# Patient Record
Sex: Female | Born: 1940 | Race: White | Hispanic: No | Smoking: Never smoker
Health system: Southern US, Community
[De-identification: ages and names within clinical notes are randomized; demographics above are authoritative.]

## PROBLEM LIST (undated history)

## (undated) DIAGNOSIS — C4491 Basal cell carcinoma of skin, unspecified: Secondary | ICD-10-CM

## (undated) DIAGNOSIS — I491 Atrial premature depolarization: Secondary | ICD-10-CM

## (undated) DIAGNOSIS — I10 Essential (primary) hypertension: Secondary | ICD-10-CM

## (undated) DIAGNOSIS — I499 Cardiac arrhythmia, unspecified: Secondary | ICD-10-CM

## (undated) DIAGNOSIS — D649 Anemia, unspecified: Secondary | ICD-10-CM

## (undated) DIAGNOSIS — H269 Unspecified cataract: Secondary | ICD-10-CM

## (undated) DIAGNOSIS — M8580 Other specified disorders of bone density and structure, unspecified site: Secondary | ICD-10-CM

## (undated) DIAGNOSIS — M51369 Other intervertebral disc degeneration, lumbar region without mention of lumbar back pain or lower extremity pain: Secondary | ICD-10-CM

## (undated) DIAGNOSIS — R413 Other amnesia: Secondary | ICD-10-CM

## (undated) DIAGNOSIS — M1990 Unspecified osteoarthritis, unspecified site: Secondary | ICD-10-CM

## (undated) DIAGNOSIS — N390 Urinary tract infection, site not specified: Secondary | ICD-10-CM

## (undated) DIAGNOSIS — Z87442 Personal history of urinary calculi: Secondary | ICD-10-CM

## (undated) DIAGNOSIS — I4891 Unspecified atrial fibrillation: Secondary | ICD-10-CM

## (undated) DIAGNOSIS — N281 Cyst of kidney, acquired: Secondary | ICD-10-CM

## (undated) DIAGNOSIS — Z8619 Personal history of other infectious and parasitic diseases: Secondary | ICD-10-CM

## (undated) DIAGNOSIS — R718 Other abnormality of red blood cells: Secondary | ICD-10-CM

## (undated) DIAGNOSIS — I2510 Atherosclerotic heart disease of native coronary artery without angina pectoris: Secondary | ICD-10-CM

## (undated) DIAGNOSIS — I700 Atherosclerosis of aorta: Secondary | ICD-10-CM

## (undated) DIAGNOSIS — E538 Deficiency of other specified B group vitamins: Secondary | ICD-10-CM

## (undated) DIAGNOSIS — D3502 Benign neoplasm of left adrenal gland: Secondary | ICD-10-CM

## (undated) DIAGNOSIS — Z7901 Long term (current) use of anticoagulants: Secondary | ICD-10-CM

## (undated) DIAGNOSIS — E010 Iodine-deficiency related diffuse (endemic) goiter: Secondary | ICD-10-CM

## (undated) DIAGNOSIS — M5136 Other intervertebral disc degeneration, lumbar region: Secondary | ICD-10-CM

## (undated) HISTORY — DX: Unspecified osteoarthritis, unspecified site: M19.90

## (undated) HISTORY — PX: EYE SURGERY: SHX253

## (undated) HISTORY — DX: Unspecified cataract: H26.9

## (undated) HISTORY — DX: Personal history of other infectious and parasitic diseases: Z86.19

## (undated) HISTORY — PX: CATARACT EXTRACTION: SUR2

## (undated) HISTORY — DX: Urinary tract infection, site not specified: N39.0

## (undated) HISTORY — DX: Cardiac arrhythmia, unspecified: I49.9

## (undated) HISTORY — PX: JOINT REPLACEMENT: SHX530

## (undated) HISTORY — PX: FOOT SURGERY: SHX648

---

## 2007-07-29 HISTORY — PX: BUNIONECTOMY WITH HAMMERTOE RECONSTRUCTION: SHX5600

## 2016-10-02 DIAGNOSIS — Z9842 Cataract extraction status, left eye: Secondary | ICD-10-CM | POA: Diagnosis not present

## 2016-10-02 DIAGNOSIS — H01001 Unspecified blepharitis right upper eyelid: Secondary | ICD-10-CM | POA: Diagnosis not present

## 2016-10-02 DIAGNOSIS — H02834 Dermatochalasis of left upper eyelid: Secondary | ICD-10-CM | POA: Diagnosis not present

## 2016-10-02 DIAGNOSIS — H01004 Unspecified blepharitis left upper eyelid: Secondary | ICD-10-CM | POA: Diagnosis not present

## 2016-10-02 DIAGNOSIS — H01002 Unspecified blepharitis right lower eyelid: Secondary | ICD-10-CM | POA: Diagnosis not present

## 2016-10-02 DIAGNOSIS — H11001 Unspecified pterygium of right eye: Secondary | ICD-10-CM | POA: Diagnosis not present

## 2016-10-02 DIAGNOSIS — H02831 Dermatochalasis of right upper eyelid: Secondary | ICD-10-CM | POA: Diagnosis not present

## 2016-10-02 DIAGNOSIS — Z9841 Cataract extraction status, right eye: Secondary | ICD-10-CM | POA: Diagnosis not present

## 2016-10-02 DIAGNOSIS — H01005 Unspecified blepharitis left lower eyelid: Secondary | ICD-10-CM | POA: Diagnosis not present

## 2017-05-09 DIAGNOSIS — Z23 Encounter for immunization: Secondary | ICD-10-CM | POA: Diagnosis not present

## 2017-09-04 DIAGNOSIS — Z961 Presence of intraocular lens: Secondary | ICD-10-CM | POA: Diagnosis not present

## 2017-09-04 DIAGNOSIS — H35372 Puckering of macula, left eye: Secondary | ICD-10-CM | POA: Diagnosis not present

## 2017-09-04 DIAGNOSIS — H43391 Other vitreous opacities, right eye: Secondary | ICD-10-CM | POA: Diagnosis not present

## 2017-09-04 DIAGNOSIS — H524 Presbyopia: Secondary | ICD-10-CM | POA: Diagnosis not present

## 2018-04-22 DIAGNOSIS — Z23 Encounter for immunization: Secondary | ICD-10-CM | POA: Diagnosis not present

## 2018-05-04 ENCOUNTER — Telehealth: Payer: Self-pay

## 2018-05-04 NOTE — Telephone Encounter (Signed)
Copied from [place] [id number]. Topic: Appointment Scheduling - New Patient >> May 04, 2018 10:20 AM Vernona Rieger wrote: New patient has been scheduled for your office. Provider: Aundra Dubin Date of Appointment: 11/15  Route to department's PEC pool.

## 2018-05-04 NOTE — Telephone Encounter (Signed)
NP paperwork mailed out.

## 2018-05-27 ENCOUNTER — Encounter: Payer: Self-pay | Admitting: Podiatry

## 2018-05-27 ENCOUNTER — Ambulatory Visit (INDEPENDENT_AMBULATORY_CARE_PROVIDER_SITE_OTHER): Payer: Medicare Other | Admitting: Podiatry

## 2018-05-27 DIAGNOSIS — B351 Tinea unguium: Secondary | ICD-10-CM | POA: Diagnosis not present

## 2018-05-27 DIAGNOSIS — M79675 Pain in left toe(s): Secondary | ICD-10-CM

## 2018-05-27 DIAGNOSIS — M79674 Pain in right toe(s): Secondary | ICD-10-CM

## 2018-05-27 DIAGNOSIS — L84 Corns and callosities: Secondary | ICD-10-CM

## 2018-05-27 NOTE — Progress Notes (Signed)
This patient presents the office with chief complaint of long thick painful nails.  She presents the office accompanied by her daughter for her visit.  She says that the nails are painful walking and wearing her shoes.  She is unable to self treat.  She also has calluses on her big toe left foot which is caused by the bunion on her left foot.  She also has a history of bunion correction in [place] 10 years ago.  She presents the office today for evaluation and treatment of her feet  General Appearance  Alert, conversant and in no acute stress.  Vascular  Dorsalis pedis and posterior tibial  pulses are palpable  bilaterally.  Capillary return is within normal limits  bilaterally. Temperature is within normal limits  bilaterally.  Neurologic  Senn-Weinstein monofilament wire test within normal limits  bilaterally. Muscle power within normal limits bilaterally.  Nails Thick disfigured discolored nails with subungual debris  from hallux to fifth toes bilaterally. No evidence of bacterial infection or drainage bilaterally.  Orthopedic  No limitations of motion  feet .  No crepitus or effusions noted.  HAV  B/L.  Skin  normotropic skin with no porokeratosis noted bilaterally.  No signs of infections or ulcers noted.  Pinch callus left hallux.  Onychomycosis  Callus left hallux   IE  Debride nails.  Debride callus  RTC 3 months.  Patient was told to take off her nail polish prior to her next visit.   Gardiner Barefoot DPM

## 2018-06-11 ENCOUNTER — Encounter: Payer: Self-pay | Admitting: Internal Medicine

## 2018-06-11 ENCOUNTER — Ambulatory Visit (INDEPENDENT_AMBULATORY_CARE_PROVIDER_SITE_OTHER): Payer: Medicare Other | Admitting: Internal Medicine

## 2018-06-11 VITALS — BP 132/96 | HR 90 | Temp 98.4°F | Ht 63.0 in | Wt 158.1 lb

## 2018-06-11 DIAGNOSIS — M25561 Pain in right knee: Secondary | ICD-10-CM

## 2018-06-11 DIAGNOSIS — M25562 Pain in left knee: Secondary | ICD-10-CM | POA: Diagnosis not present

## 2018-06-11 DIAGNOSIS — M25511 Pain in right shoulder: Secondary | ICD-10-CM | POA: Diagnosis not present

## 2018-06-11 DIAGNOSIS — R413 Other amnesia: Secondary | ICD-10-CM | POA: Diagnosis not present

## 2018-06-11 DIAGNOSIS — M1990 Unspecified osteoarthritis, unspecified site: Secondary | ICD-10-CM | POA: Diagnosis not present

## 2018-06-11 DIAGNOSIS — G8929 Other chronic pain: Secondary | ICD-10-CM | POA: Diagnosis not present

## 2018-06-11 HISTORY — DX: Pain in right knee: M25.561

## 2018-06-11 HISTORY — DX: Pain in right shoulder: M25.511

## 2018-06-11 MED ORDER — DICLOFENAC SODIUM 1 % TD GEL: 2.0000 g | Freq: Four times a day (QID) | TRANSDERMAL | 11 refills | Status: DC

## 2018-06-11 NOTE — Progress Notes (Signed)
Chief Complaint  Patient presents with  . Establish Care   New patient with daughter today whom she lives  1. C/o knee pain, right shoulder pain at times walks with cane and reduced ROM right shoulder hemp tried and daughter reports c/o less pain  2. Daughter c/o memory loss and pt getting loss driving this is new w/in the last 1 year. She is c/w fact if mom should be driving    Review of Systems  Constitutional: Negative for weight loss.  HENT: Negative for hearing loss.   Eyes: Negative for blurred vision.  Respiratory: Negative for shortness of breath.   Cardiovascular: Negative for chest pain.  Gastrointestinal: Negative for abdominal pain.  Musculoskeletal: Positive for joint pain.  Skin: Negative for rash.  Neurological: Negative for headaches.  Psychiatric/Behavioral: Positive for memory loss. Negative for depression.   Past Medical History:  Diagnosis Date  . Arthritis    knees, right shoulder   . History of chicken pox   . UTI (urinary tract infection)    Past Surgical History:  Procedure Laterality Date  . [place]  . EYE SURGERY     cataract 2013/2014 b/l   . FOOT SURGERY     bunion an dhammer toe in 2009   Family History  Problem Relation Age of Onset  . Heart disease Mother        died when pt was 55 y.o   . Heart disease Father    Social History   Socioeconomic History  . Marital status: Unknown    Spouse name: Not on file  . Number of children: Not on file  . Years of education: Not on file  . Highest education level: Not on file  Occupational History  . Not on file  Social Needs  . Financial resource strain: Not on file  . Food insecurity:    Worry: Not on file    Inability: Not on file  . Transportation needs:    Medical: Not on file    Non-medical: Not on file  Tobacco Use  . Smoking status: Never Smoker  . Smokeless tobacco: Never Used  Substance and Sexual Activity  . Alcohol use: Yes    Alcohol/week: 1.0 - 2.0  standard drinks    Types: 1 - 2 Glasses of wine per week    Comment: occasional drinker  . Drug use: Never  . Sexual activity: Not on file  Lifestyle  . Physical activity:    Days per week: Not on file    Minutes per session: Not on file  . Stress: Not on file  Relationships  . Social connections:    Talks on phone: Not on file    Gets together: Not on file    Attends religious service: Not on file    Active member of club or organization: Not on file    Attends meetings of clubs or organizations: Not on file    Relationship status: Not on file  . Intimate partner violence:    Fear of current or ex partner: Not on file    Emotionally abused: Not on file    Physically abused: Not on file    Forced sexual activity: Not on file  Other Topics Concern  . Not on file  Social History Narrative   From [place] lived in [place] since late 1990s early 2000    Lives with daughter    Secretary/administrator ed    Former Pharmacist, hospital  No guns, wears seat belt, safe in relationship       2 daughters 1/2 in [employer]    Current Meds  Medication Sig  . CHOLECALCIFEROL PO Take 1,000 Units by mouth daily.   . NON FORMULARY Hemp Oil 500mg  daily  . Omega-3 Fatty Acids (FISH OIL) 1000 MG CAPS Take by mouth daily.   Marland Kitchen UNABLE TO FIND Med Name: D6 100mg  daily  . UNABLE TO FIND Med Name: B12 1088mcg daily  . vitamin B-12 (CYANOCOBALAMIN) 100 MCG tablet Take 100 mcg by mouth daily.   No Known Allergies No results found for this or any previous visit (from the past 2160 hour(s)). Objective  Body mass index is 28.01 kg/m. Wt Readings from Last 3 Encounters:  06/11/18 158 lb 1.9 oz (71.7 kg)   Temp Readings from Last 3 Encounters:  06/11/18 98.4 F (36.9 C) (Oral)   BP Readings from Last 3 Encounters:  06/11/18 (!) 132/96   Pulse Readings from Last 3 Encounters:  06/11/18 90    Physical Exam  Constitutional: She is oriented to person, place, and time. Vital signs are normal. She appears well-developed and  well-nourished. She is cooperative.  HENT:  Head: Normocephalic and atraumatic.  Mouth/Throat: Oropharynx is clear and moist and mucous membranes are normal.  Eyes: Pupils are equal, round, and reactive to light. Conjunctivae are normal.  Cardiovascular: Normal rate, regular rhythm and normal heart sounds.  Pulmonary/Chest: Effort normal and breath sounds normal.  Musculoskeletal:       Right shoulder: She exhibits decreased range of motion and tenderness.  Neurological: She is alert and oriented to person, place, and time. Gait normal.  BL walks with cane   Skin: Skin is warm, dry and intact.  Psychiatric: She has a normal mood and affect. Her speech is normal and behavior is normal. Judgment and thought content normal. Cognition and memory are normal.  Nursing note and vitals reviewed.   Assessment   1. Right shoulder and b/l knee pain likely arthritis  2. C/w memory loss  3. HM Plan   1.  Consider Xrays and order referral if getting worse  Trial of voltaren gel  Disc prn Tylenol, tumeric/glucosamine/chondroitin  2.  sch fasting labs  Consider CT head and OT eval for driving daughter declines for now  3.  Flu shot had 04/22/18 and prevnar  Consider pna 23 in future if has not had in 1 year  Consider shingrix vaccine and Tdap   Never smoker  Daughter will reach out to sister to try to get pap, mammo, DEXA records from [place]  Never had colonoscopy consider cologaurd in the future   Eye Dr. Alice Reichert  Podiatry Dr. Prudence Davidson  Dentist Dr. Gilman Schmidt  Provider: Dr. Olivia Mackie McLean-Scocuzza-Internal Medicine

## 2018-06-11 NOTE — Patient Instructions (Addendum)
Tylenol as needed 500 mg up to 6 pills per day  Tumeric or Glucosamine chondroitin    Management of Memory Problems  There are some general things you can do to help manage your memory problems.  Your memory may not in fact recover, but by using techniques and strategies you will be able to manage your memory difficulties better.  1)  Establish a routine.  Try to establish and then stick to a regular routine.  By doing this, you will get used to what to expect and you will reduce the need to rely on your memory.  Also, try to do things at the same time of day, such as taking your medication or checking your calendar first thing in the morning.  Think about think that you can do as a part of a regular routine and make a list.  Then enter them into a daily planner to remind you.  This will help you establish a routine.  2)  Organize your environment.  Organize your environment so that it is uncluttered.  Decrease visual stimulation.  Place everyday items such as keys or cell phone in the same place every day (ie.  Basket next to front door)  Use post it notes with a brief message to yourself (ie. Turn off light, lock the door)  Use labels to indicate where things go (ie. Which cupboards are for food, dishes, etc.)  Keep a notepad and pen by the telephone to take messages  3)  Memory Aids  A diary or journal/notebook/daily planner  Making a list (shopping list, chore list, to do list that needs to be done)  Using an alarm as a reminder (kitchen timer or cell phone alarm)  Using cell phone to store information (Notes, Calendar, Reminders)  Calendar/White board placed in a prominent position  Post-it notes  In order for memory aids to be useful, you need to have good habits.  It's no good remembering to make a note in your journal if you don't remember to look in it.  Try setting aside a certain time of day to look in journal.  4)  Improving mood and managing fatigue.  There may  be other factors that contribute to memory difficulties.  Factors, such as anxiety, depression and tiredness can affect memory.  Regular gentle exercise can help improve your mood and give you more energy.  Simple relaxation techniques may help relieve symptoms of anxiety  Try to get back to completing activities or hobbies you enjoyed doing in the past.  Learn to pace yourself through activities to decrease fatigue.  Find out about some local support groups where you can share experiences with others.  Try and achieve 7-8 hours of sleep at night. Dementia Dementia is the loss of two or more brain functions, such as:  Memory.  Decision making.  Behavior.  Speaking.  Thinking.  Problem solving.  There are many types of dementia. The most common type is called progressive dementia. Progressive dementia gets worse with time and it is irreversible. An example of this type of dementia is Alzheimer disease. What are the causes? This condition may be caused by:  Nerve cell damage in the brain.  Genetic mutations.  Certain medicines.  Multiple small strokes.  An infection, such as chronic meningitis.  A metabolic problem, such as vitamin B12 deficiency or thyroid disease.  Pressure on the brain, such as from a tumor or blood clot.  What are the signs or symptoms? Symptoms of this  condition include:  Sudden changes in mood.  Depression.  Problems with balance.  Changes in personality.  Poor short-term memory.  Agitation.  Delusions.  Hallucinations.  Having a hard time: ? Speaking thoughts. ? Finding words. ? Solving problems. ? Doing familiar tasks. ? Understanding familiar ideas.  How is this diagnosed? This condition is diagnosed with an assessment by your health care provider. During this assessment, your health care provider will talk with you and your family, friends, or caregivers about your symptoms. A thorough medical history will be taken,  and you will have a physical exam and tests. Tests may include:  Lab tests, such as blood or urine tests.  Imaging tests, such as a CT scan, PET scan, or MRI.  A lumbar puncture. This test involves removing and testing a small amount of the fluid that surrounds the brain and spinal cord.  An electroencephalogram (EEG). In this test, small metal discs are used to measure electrical activity in the brain.  Memory tests, cognitive tests, and neuropsychological tests. These tests evaluate brain function.  How is this treated? Treatment depends on the cause of the dementia. It may involve taking medicines that may help:  To control the dementia.  To slow down the disease.  To manage symptoms.  In some cases, treating the cause of the dementia can improve symptoms, reverse symptoms, or slow down how quickly the dementia gets worse. Your health care provider can help direct you to support groups, organizations, and other health care providers who can help with decisions about your care. Follow these instructions at home: Medicine  Take over-the-counter and prescription medicines only as told by your health care provider.  Avoid taking medicines that can affect thinking, such as pain or sleeping medicines. Lifestyle   Make healthy lifestyle choices: ? Be physically active as told by your health care provider. ? Do not use any tobacco products, such as cigarettes, chewing tobacco, and e-cigarettes. If you need help quitting, ask your health care provider. ? Eat a healthy diet. ? Practice stress-management techniques when you get stressed. ? Stay social.  Drink enough fluid to keep your urine clear or pale yellow.  Make sure to get quality sleep. These tips can help you to get a good night's rest: ? Avoid napping during the day. ? Keep your sleeping area dark and cool. ? Avoid exercising during the few hours before you go to bed. ? Avoid caffeine products in the evening. General  instructions  Work with your health care provider to determine what you need help with and what your safety needs are.  If you were given a bracelet that tracks your location, make sure to wear it.  Keep all follow-up visits as told by your health care provider. This is important. Contact a health care provider if:  You have any new symptoms.  You have problems with choking or swallowing.  You have any symptoms of a different illness. Get help right away if:  You develop a fever.  You have new or worsening confusion.  You have new or worsening sleepiness.  You have a hard time staying awake.  You or your family members become concerned for your safety. This information is not intended to replace advice given to you by your health care provider. Make sure you discuss any questions you have with your health care provider. Document Released: 01/07/2001 Document Revised: 11/22/2015 Document Reviewed: 04/11/2015 Elsevier Interactive Patient Education  2018 [employer].  Shoulder Pain Many things can  cause shoulder pain, including:  An injury to the area.  Overuse of the shoulder.  Arthritis.  The source of the pain can be:  Inflammation.  An injury to the shoulder joint.  An injury to a tendon, ligament, or bone.  Follow these instructions at home: Take these actions to help with your pain:  Squeeze a soft ball or a foam pad as much as possible. This helps to keep the shoulder from swelling. It also helps to strengthen the arm.  Take over-the-counter and prescription medicines only as told by your health care provider.  If directed, apply ice to the area: ? Put ice in a plastic bag. ? Place a towel between your skin and the bag. ? Leave the ice on for 20 minutes, 2-3 times per day. Stop applying ice if it does not help with the pain.  If you were given a shoulder sling or immobilizer: ? Wear it as told. ? Remove it to shower or bathe. ? Move your arm as  little as possible, but keep your hand moving to prevent swelling.  Contact a health care provider if:  Your pain gets worse.  Your pain is not relieved with medicines.  New pain develops in your arm, hand, or fingers. Get help right away if:  Your arm, hand, or fingers: ? Tingle. ? Become numb. ? Become swollen. ? Become painful. ? Turn white or blue. This information is not intended to replace advice given to you by your health care provider. Make sure you discuss any questions you have with your health care provider. Document Released: 04/23/2005 Document Revised: 03/09/2016 Document Reviewed: 11/06/2014 Elsevier Interactive Patient Education  2018 [place].  Shoulder Impingement Syndrome Shoulder impingement syndrome is a condition that causes pain when connective tissues (tendons) surrounding the shoulder joint become pinched. These tendons are part of the group of muscles and tissues that help to stabilize the shoulder (rotator cuff). Beneath the rotator cuff is a fluid-filled sac (bursa) that allows the muscles and tendons to glide smoothly. The bursa may become swollen or irritated (bursitis). Bursitis, swelling in the rotator cuff tendons, or both conditions can decrease how much space is under a bone in the shoulder joint (acromion), resulting in impingement. What are the causes? Shoulder impingement syndrome can be caused by bursitis or swelling of the rotator cuff tendons, which may result from:  Repetitive overhead arm movements.  Falling onto the shoulder.  Weakness in the shoulder muscles.  What increases the risk? You may be more likely to develop this condition if you are an athlete who participates in:  Sports that involve throwing, such as baseball.  Tennis.  Swimming.  Volleyball.  Some people are also more likely to develop impingement syndrome because of the shape of their acromion bone. What are the signs or symptoms? The main symptom of this  condition is pain on the front or side of the shoulder. Pain may:  Get worse when lifting or raising the arm.  Get worse at night.  Wake you up from sleeping.  Feel sharp when the shoulder is moved, and then fade to an ache.  Other signs and symptoms may include:  Tenderness.  Stiffness.  Inability to raise the arm above shoulder level or behind the body.  Weakness.  How is this diagnosed? This condition may be diagnosed based on:  Your symptoms.  Your medical history.  A physical exam.  Imaging tests, such as: ? X-rays. ? MRI. ? Ultrasound.  How is  this treated? Treatment for this condition may include:  Resting your shoulder and avoiding all activities that cause pain or put stress on the shoulder.  Icing your shoulder.  NSAIDs to help reduce pain and swelling.  One or more injections of medicines to numb the area and reduce inflammation.  Physical therapy.  Surgery. This may be needed if nonsurgical treatments have not helped. Surgery may involve repairing the rotator cuff, reshaping the acromion, or removing the bursa.  Follow these instructions at home: Managing pain, stiffness, and swelling  If directed, apply ice to the injured area. ? Put ice in a plastic bag. ? Place a towel between your skin and the bag. ? Leave the ice on for 20 minutes, 2-3 times a day. Activity  Rest and return to your normal activities as told by your health care provider. Ask your health care provider what activities are safe for you.  Do exercises as told by your health care provider. General instructions  Do not use any tobacco products, including cigarettes, chewing tobacco, or e-cigarettes. Tobacco can delay healing. If you need help quitting, ask your health care provider.  Ask your health care provider when it is safe for you to drive.  Take over-the-counter and prescription medicines only as told by your health care provider.  Keep all follow-up visits as told  by your health care provider. This is important. How is this prevented?  Give your body time to rest between periods of activity.  Be safe and responsible while being active to avoid falls.  Maintain physical fitness, including strength and flexibility. Contact a health care provider if:  Your symptoms have not improved after 1-2 months of treatment and rest.  You cannot lift your arm away from your body. This information is not intended to replace advice given to you by your health care provider. Make sure you discuss any questions you have with your health care provider. Document Released: 07/14/2005 Document Revised: 03/20/2016 Document Reviewed: 06/16/2015 Elsevier Interactive Patient Education  [employer].

## 2018-06-11 NOTE — Progress Notes (Signed)
Pre visit review using our clinic review tool, if applicable. No additional management support is needed unless otherwise documented below in the visit note. 

## 2018-06-15 ENCOUNTER — Telehealth: Payer: Self-pay | Admitting: *Deleted

## 2018-06-15 NOTE — Telephone Encounter (Signed)
Please place future lab orders for lab appt on 06/16/18

## 2018-06-16 ENCOUNTER — Encounter: Payer: Self-pay | Admitting: Internal Medicine

## 2018-06-16 ENCOUNTER — Other Ambulatory Visit (INDEPENDENT_AMBULATORY_CARE_PROVIDER_SITE_OTHER): Payer: Medicare Other

## 2018-06-16 ENCOUNTER — Other Ambulatory Visit: Payer: Self-pay | Admitting: Internal Medicine

## 2018-06-16 DIAGNOSIS — E538 Deficiency of other specified B group vitamins: Secondary | ICD-10-CM

## 2018-06-16 DIAGNOSIS — E559 Vitamin D deficiency, unspecified: Secondary | ICD-10-CM

## 2018-06-16 DIAGNOSIS — Z0000 Encounter for general adult medical examination without abnormal findings: Secondary | ICD-10-CM

## 2018-06-16 DIAGNOSIS — Z87440 Personal history of urinary (tract) infections: Secondary | ICD-10-CM | POA: Diagnosis not present

## 2018-06-16 DIAGNOSIS — Z1389 Encounter for screening for other disorder: Secondary | ICD-10-CM | POA: Diagnosis not present

## 2018-06-16 DIAGNOSIS — Z13220 Encounter for screening for lipoid disorders: Secondary | ICD-10-CM | POA: Diagnosis not present

## 2018-06-16 DIAGNOSIS — M1990 Unspecified osteoarthritis, unspecified site: Secondary | ICD-10-CM

## 2018-06-16 DIAGNOSIS — R030 Elevated blood-pressure reading, without diagnosis of hypertension: Secondary | ICD-10-CM

## 2018-06-16 DIAGNOSIS — R413 Other amnesia: Secondary | ICD-10-CM

## 2018-06-16 DIAGNOSIS — Z1329 Encounter for screening for other suspected endocrine disorder: Secondary | ICD-10-CM

## 2018-06-16 LAB — COMPREHENSIVE METABOLIC PANEL
ALT: 14 U/L (ref 0–35)
AST: 13 U/L (ref 0–37)
Albumin: 4.1 g/dL (ref 3.5–5.2)
Alkaline Phosphatase: 89 U/L (ref 39–117)
BUN: 21 mg/dL (ref 6–23)
CO2: 28 mEq/L (ref 19–32)
Calcium: 9.5 mg/dL (ref 8.4–10.5)
Chloride: 103 mEq/L (ref 96–112)
Creatinine, Ser: 0.62 mg/dL (ref 0.40–1.20)
GFR: 98.95 mL/min (ref >60.00)
Glucose, Bld: 93 mg/dL (ref 70–99)
Potassium: 4 mEq/L (ref 3.5–5.1)
Sodium: 140 mEq/L (ref 135–145)
Total Bilirubin: 0.7 mg/dL (ref 0.2–1.2)
Total Protein: 6.8 g/dL (ref 6.0–8.3)

## 2018-06-16 LAB — LIPID PANEL
Cholesterol: 173 mg/dL (ref 0–200)
HDL: 65.6 mg/dL (ref >39.00)
LDL Cholesterol: 93 mg/dL (ref 0–99)
NonHDL: 107.37
Total CHOL/HDL Ratio: 3
Triglycerides: 73 mg/dL (ref 0.0–149.0)
VLDL: 14.6 mg/dL (ref 0.0–40.0)

## 2018-06-16 LAB — CBC WITH DIFFERENTIAL/PLATELET
Basophils Absolute: 0 10*3/uL (ref 0.0–0.1)
Basophils Relative: 0.3 % (ref 0.0–3.0)
Eosinophils Absolute: 0.1 10*3/uL (ref 0.0–0.7)
Eosinophils Relative: 1.6 % (ref 0.0–5.0)
HCT: 42.1 % (ref 36.0–46.0)
Hemoglobin: 13.3 g/dL (ref 12.0–15.0)
Lymphocytes Relative: 31.7 % (ref 12.0–46.0)
Lymphs Abs: 2.2 10*3/uL (ref 0.7–4.0)
MCHC: 31.6 g/dL (ref 30.0–36.0)
MCV: 73.3 fl — ABNORMAL LOW (ref 78.0–100.0)
Monocytes Absolute: 0.5 10*3/uL (ref 0.1–1.0)
Monocytes Relative: 7.7 % (ref 3.0–12.0)
Neutro Abs: 4 10*3/uL (ref 1.4–7.7)
Neutrophils Relative %: 58.7 % (ref 43.0–77.0)
Platelets: 204 10*3/uL (ref 150.0–400.0)
RBC: 5.75 Mil/uL — ABNORMAL HIGH (ref 3.87–5.11)
RDW: 14.9 % (ref 11.5–15.5)
WBC: 6.9 10*3/uL (ref 4.0–10.5)

## 2018-06-16 LAB — VITAMIN B12: Vitamin B-12: 667 pg/mL (ref 211–911)

## 2018-06-16 LAB — TSH: TSH: 0.56 u[IU]/mL (ref 0.35–4.50)

## 2018-06-16 LAB — VITAMIN D 25 HYDROXY (VIT D DEFICIENCY, FRACTURES): VITD: 40.14 ng/mL (ref 30.00–100.00)

## 2018-06-16 NOTE — Addendum Note (Signed)
Addended by: Arby Barrette on: 06/16/2018 10:02 AM   Modules accepted: Orders

## 2018-06-17 ENCOUNTER — Other Ambulatory Visit: Payer: Self-pay | Admitting: Internal Medicine

## 2018-06-17 DIAGNOSIS — R319 Hematuria, unspecified: Secondary | ICD-10-CM

## 2018-06-17 LAB — MICROSCOPIC EXAMINATION: Casts: NONE SEEN /lpf

## 2018-06-17 LAB — URINALYSIS, ROUTINE W REFLEX MICROSCOPIC
Bilirubin, UA: NEGATIVE
Glucose, UA: NEGATIVE
Ketones, UA: NEGATIVE
Nitrite, UA: NEGATIVE
Protein, UA: NEGATIVE
Specific Gravity, UA: 1.021 (ref 1.005–1.030)
Urobilinogen, Ur: 1 mg/dL (ref 0.2–1.0)
pH, UA: 6.5 (ref 5.0–7.5)

## 2018-06-21 ENCOUNTER — Other Ambulatory Visit: Payer: Medicare Other

## 2018-06-21 ENCOUNTER — Encounter: Payer: Self-pay | Admitting: *Deleted

## 2018-06-21 DIAGNOSIS — R319 Hematuria, unspecified: Secondary | ICD-10-CM

## 2018-06-23 ENCOUNTER — Other Ambulatory Visit: Payer: Self-pay | Admitting: Internal Medicine

## 2018-06-23 DIAGNOSIS — B9620 Unspecified Escherichia coli [E. coli] as the cause of diseases classified elsewhere: Secondary | ICD-10-CM

## 2018-06-23 DIAGNOSIS — N390 Urinary tract infection, site not specified: Principal | ICD-10-CM

## 2018-06-23 LAB — URINE CULTURE
MICRO NUMBER:: 91418260
SPECIMEN QUALITY:: ADEQUATE

## 2018-06-23 MED ORDER — CIPROFLOXACIN HCL 500 MG PO TABS: 500.0000 mg | ORAL_TABLET | Freq: Two times a day (BID) | ORAL | 0 refills | Status: DC

## 2018-08-26 ENCOUNTER — Ambulatory Visit (INDEPENDENT_AMBULATORY_CARE_PROVIDER_SITE_OTHER): Payer: Medicare Other | Admitting: Podiatry

## 2018-08-26 ENCOUNTER — Encounter: Payer: Self-pay | Admitting: Podiatry

## 2018-08-26 DIAGNOSIS — M79674 Pain in right toe(s): Secondary | ICD-10-CM

## 2018-08-26 DIAGNOSIS — B351 Tinea unguium: Secondary | ICD-10-CM

## 2018-08-26 DIAGNOSIS — M79675 Pain in left toe(s): Secondary | ICD-10-CM

## 2018-08-26 DIAGNOSIS — L84 Corns and callosities: Secondary | ICD-10-CM

## 2018-08-26 NOTE — Progress Notes (Signed)
Complaint:  Visit Type: Patient returns to my office for continued preventative foot care services. Complaint: Patient states" my nails have grown long and thick and become painful to walk and wear shoes" .  She presents to the office f or painful calluses both feet. The patient presents for preventative foot care services. No changes to ROS  Podiatric Exam: Vascular: dorsalis pedis and posterior tibial pulses are palpable bilateral. Capillary return is immediate. Temperature gradient is WNL. Skin turgor WNL  Sensorium: Normal Semmes Weinstein monofilament test. Normal tactile sensation bilaterally. Nail Exam: Pt has thick disfigured discolored nails with subungual debris noted bilateral entire nail hallux through fifth toenails Ulcer Exam: There is no evidence of ulcer or pre-ulcerative changes or infection. Orthopedic Exam: Muscle tone and strength are WNL. No limitations in general ROM. No crepitus or effusions noted. Foot type and digits show no abnormalities. Bony prominences are unremarkable. Skin: No Porokeratosis. No infection or ulcers.  Pinch callus left hallux.  Callus sub 1st MPJ right foot.  Diagnosis:  Onychomycosis, , Pain in right toe, pain in left toes  Callus  X 2  Treatment & Plan Procedures and Treatment: Consent by patient was obtained for treatment procedures.   Debridement of mycotic and hypertrophic toenails, 1 through 5 bilateral and clearing of subungual debris. No ulceration, no infection noted. Debride callus  B/l..  ABN signed for 2020.Return Visit-Office Procedure: Patient instructed to return to the office for a follow up visit 3 months for continued evaluation and treatment.    Gardiner Barefoot DPM

## 2018-09-10 DIAGNOSIS — H43393 Other vitreous opacities, bilateral: Secondary | ICD-10-CM | POA: Diagnosis not present

## 2018-09-10 DIAGNOSIS — H35372 Puckering of macula, left eye: Secondary | ICD-10-CM | POA: Diagnosis not present

## 2018-09-10 DIAGNOSIS — Z961 Presence of intraocular lens: Secondary | ICD-10-CM | POA: Diagnosis not present

## 2018-09-15 ENCOUNTER — Ambulatory Visit (INDEPENDENT_AMBULATORY_CARE_PROVIDER_SITE_OTHER): Payer: Medicare Other | Admitting: Internal Medicine

## 2018-09-15 ENCOUNTER — Encounter: Payer: Self-pay | Admitting: Internal Medicine

## 2018-09-15 VITALS — BP 122/70 | HR 94 | Temp 98.6°F | Ht 63.0 in | Wt 152.2 lb

## 2018-09-15 DIAGNOSIS — Z1231 Encounter for screening mammogram for malignant neoplasm of breast: Secondary | ICD-10-CM | POA: Diagnosis not present

## 2018-09-15 DIAGNOSIS — Z13820 Encounter for screening for osteoporosis: Secondary | ICD-10-CM

## 2018-09-15 DIAGNOSIS — E2839 Other primary ovarian failure: Secondary | ICD-10-CM | POA: Diagnosis not present

## 2018-09-15 DIAGNOSIS — R718 Other abnormality of red blood cells: Secondary | ICD-10-CM

## 2018-09-15 DIAGNOSIS — N390 Urinary tract infection, site not specified: Secondary | ICD-10-CM

## 2018-09-15 NOTE — Patient Instructions (Signed)
Please take multivitamin with iron 325 mg/65 Fe

## 2018-09-15 NOTE — Progress Notes (Signed)
Pre visit review using our clinic review tool, if applicable. No additional management support is needed unless otherwise documented below in the visit note. 

## 2018-09-15 NOTE — Progress Notes (Signed)
Chief Complaint  Patient presents with  . Follow-up   F/u with daughter elizabeth  1. UTI sx's improved and memory improved after txing UTI  2. Reviewed labs  3. Agreeable to DEXA, mammogram, cologuard    Review of Systems  Constitutional: Negative for weight loss.  HENT: Negative for hearing loss.   Eyes: Negative for blurred vision.  Respiratory: Negative for shortness of breath.   Cardiovascular: Negative for chest pain.  Gastrointestinal: Negative for abdominal pain.  Genitourinary: Negative for dysuria.  Musculoskeletal: Negative for falls.  Skin: Negative for rash.  Neurological: Negative for headaches.  Psychiatric/Behavioral: Negative for depression.   Past Medical History:  Diagnosis Date  . Arthritis    knees, right shoulder   . History of chicken pox   . UTI (urinary tract infection)    Past Surgical History:  Procedure Laterality Date  . [place]  . EYE SURGERY     cataract 2013/2014 b/l   . FOOT SURGERY     bunion an dhammer toe in 2009   Family History  Problem Relation Age of Onset  . Heart disease Mother        died when pt was 49 y.o   . Heart disease Father    Social History   Socioeconomic History  . Marital status: Widowed    Spouse name: Not on file  . Number of children: Not on file  . Years of education: Not on file  . Highest education level: Not on file  Occupational History  . Not on file  Social Needs  . Financial resource strain: Not on file  . Food insecurity:    Worry: Not on file    Inability: Not on file  . Transportation needs:    Medical: Not on file    Non-medical: Not on file  Tobacco Use  . Smoking status: Never Smoker  . Smokeless tobacco: Never Used  Substance and Sexual Activity  . Alcohol use: Yes    Alcohol/week: 1.0 - 2.0 standard drinks    Types: 1 - 2 Glasses of wine per week    Comment: occasional drinker  . Drug use: Never  . Sexual activity: Not on file  Lifestyle  . Physical  activity:    Days per week: Not on file    Minutes per session: Not on file  . Stress: Not on file  Relationships  . Social connections:    Talks on phone: Not on file    Gets together: Not on file    Attends religious service: Not on file    Active member of club or organization: Not on file    Attends meetings of clubs or organizations: Not on file    Relationship status: Not on file  . Intimate partner violence:    Fear of current or ex partner: Not on file    Emotionally abused: Not on file    Physically abused: Not on file    Forced sexual activity: Not on file  Other Topics Concern  . Not on file  Social History Narrative   From [place] lived in [place] since late 1990s early 2000    Lives with daughter    Secretary/administrator ed    Former Pharmacist, hospital    No guns, wears seat belt, safe in relationship    Widowed       2 daughters 1/2 in [employer]    Current Meds  Medication Sig  . CHOLECALCIFEROL PO Take 1,000  Units by mouth daily.   . ciprofloxacin (CIPRO) 500 MG tablet Take 1 tablet (500 mg total) by mouth 2 (two) times daily. With food  . diclofenac sodium (VOLTAREN) 1 % GEL Apply 2-4 g topically 4 (four) times daily. 2 grams upper arms/shoulders and 4 grams knees  . NON FORMULARY Hemp Oil 500mg  daily  . Omega-3 Fatty Acids (FISH OIL) 1000 MG CAPS Take by mouth daily.   Marland Kitchen UNABLE TO FIND Med Name: D6 100mg  daily  . UNABLE TO FIND Med Name: B12 1080mcg daily  . vitamin B-12 (CYANOCOBALAMIN) 100 MCG tablet Take 100 mcg by mouth daily.   No Known Allergies Recent Results (from the past 2160 hour(s))  Urine Culture     Status: Abnormal   Collection Time: 06/21/18 11:18 AM  Result Value Ref Range   MICRO NUMBER: [id number]    SPECIMEN QUALITY: Adequate    Sample Source URINE    STATUS: FINAL    ISOLATE 1: Escherichia coli (A)     Comment: Greater than 100,000 CFU/mL of Escherichia coli      Susceptibility   Escherichia coli - URINE CULTURE, REFLEX    AMOX/CLAVULANIC <=2 Sensitive      AMPICILLIN <=2 Sensitive     AMPICILLIN/SULBACTAM <=2 Sensitive     CEFAZOLIN* <=4 Not Reportable      * For infections other than uncomplicated UTIcaused by E. coli, K. pneumoniae or P. mirabilis:Cefazolin is resistant if MIC > or = 8 mcg/mL.(Distinguishing susceptible versus intermediatefor isolates with MIC < or = 4 mcg/mL requiresadditional testing.)For uncomplicated UTI caused by E. coli,K. pneumoniae or P. mirabilis: Cefazolin issusceptible if MIC <32 mcg/mL and predictssusceptible to the oral agents cefaclor, cefdinir,cefpodoxime, cefprozil, cefuroxime, cephalexinand loracarbef.    CEFEPIME <=1 Sensitive     CEFTRIAXONE <=1 Sensitive     CIPROFLOXACIN <=0.25 Sensitive     LEVOFLOXACIN <=0.12 Sensitive     ERTAPENEM <=0.5 Sensitive     GENTAMICIN 4 Sensitive     IMIPENEM <=0.25 Sensitive     NITROFURANTOIN <=16 Sensitive     PIP/TAZO <=4 Sensitive     TOBRAMYCIN <=1 Sensitive     TRIMETH/SULFA* <=20 Sensitive      * For infections other than uncomplicated UTIcaused by E. coli, K. pneumoniae or P. mirabilis:Cefazolin is resistant if MIC > or = 8 mcg/mL.(Distinguishing susceptible versus intermediatefor isolates with MIC < or = 4 mcg/mL requiresadditional testing.)For uncomplicated UTI caused by E. coli,K. pneumoniae or P. mirabilis: Cefazolin issusceptible if MIC <32 mcg/mL and predictssusceptible to the oral agents cefaclor, cefdinir,cefpodoxime, cefprozil, cefuroxime, cephalexinand loracarbef.Legend:S = Susceptible  I = IntermediateR = Resistant  NS = Not susceptible* = Not tested  NR = Not reported**NN = See antimicrobic comments   Objective  Body mass index is 26.96 kg/m. Wt Readings from Last 3 Encounters:  09/15/18 152 lb 3.2 oz (69 kg)  06/11/18 158 lb 1.9 oz (71.7 kg)   Temp Readings from Last 3 Encounters:  09/15/18 98.6 F (37 C) (Oral)  06/11/18 98.4 F (36.9 C) (Oral)   BP Readings from Last 3 Encounters:  09/15/18 122/70  06/11/18 (!) 132/96   Pulse Readings from  Last 3 Encounters:  09/15/18 94  06/11/18 90    Physical Exam Vitals signs and nursing note reviewed.  Constitutional:      Appearance: Normal appearance. She is well-developed and well-groomed.  HENT:     Head: Normocephalic and atraumatic.     Nose: Nose normal.     Mouth/Throat:  Mouth: Mucous membranes are moist.     Pharynx: Oropharynx is clear.  Eyes:     Conjunctiva/sclera: Conjunctivae normal.     Pupils: Pupils are equal, round, and reactive to light.  Cardiovascular:     Rate and Rhythm: Normal rate and regular rhythm.     Heart sounds: Normal heart sounds.  Pulmonary:     Effort: Pulmonary effort is normal.     Breath sounds: Normal breath sounds.  Skin:    General: Skin is warm and dry.  Neurological:     General: No focal deficit present.     Mental Status: She is alert and oriented to person, place, and time. Mental status is at baseline.     Gait: Gait normal.  Psychiatric:        Attention and Perception: Attention and perception normal.        Mood and Affect: Mood and affect normal.        Speech: Speech normal.        Behavior: Behavior normal. Behavior is cooperative.        Thought Content: Thought content normal.        Cognition and Memory: Cognition and memory normal.        Judgment: Judgment normal.     Assessment   1. UTI resolved  2. Microcytosis  3. HM Plan   1. Monitor  2. rec take mvt with iron repeat cbc in future  3.  Flu shot had 04/22/18 and prevnar  Consider pna 23 in future if has not had in 1 year  Consider shingrix vaccine and Tdap   Never smoker  Daughter will reach out to sister to try to get pap, mammo, DEXA records from [place]  -will order mammo, dexa, cologuard today  Never had colonoscopy   Provider: Dr. Olivia Mackie McLean-Scocuzza-Internal Medicine

## 2018-09-24 ENCOUNTER — Encounter: Payer: Self-pay | Admitting: Radiology

## 2018-09-24 ENCOUNTER — Ambulatory Visit
Admission: RE | Admit: 2018-09-24 | Discharge: 2018-09-24 | Disposition: A | Discharge status: Home / Self Care | Payer: Medicare Other | Source: Ambulatory Visit | Attending: Internal Medicine | Admitting: Internal Medicine

## 2018-09-24 DIAGNOSIS — Z1231 Encounter for screening mammogram for malignant neoplasm of breast: Secondary | ICD-10-CM | POA: Diagnosis not present

## 2018-09-27 DIAGNOSIS — Z1212 Encounter for screening for malignant neoplasm of rectum: Secondary | ICD-10-CM | POA: Diagnosis not present

## 2018-09-27 DIAGNOSIS — Z1211 Encounter for screening for malignant neoplasm of colon: Secondary | ICD-10-CM | POA: Diagnosis not present

## 2018-09-27 LAB — COLOGUARD: Cologuard: NEGATIVE

## 2018-09-30 ENCOUNTER — Telehealth: Payer: Self-pay | Admitting: Internal Medicine

## 2018-09-30 ENCOUNTER — Encounter: Payer: Self-pay | Admitting: Internal Medicine

## 2018-09-30 NOTE — Telephone Encounter (Signed)
Call pt cologuard negative will repeat in 3 years from 09/27/2018   [place]

## 2018-10-01 NOTE — Telephone Encounter (Signed)
Left message for patient to return call back. [facility] may give results.  

## 2018-10-01 NOTE — Telephone Encounter (Signed)
Attempted to call patient back,no answer. Left message for patient to call the office back for lab results.

## 2018-10-04 NOTE — Telephone Encounter (Signed)
Pt's daughter [name] who is with pt, given results per notes of Dr Terese Door on 09/30/18.Unable to document in result note due to result note not being routed to [facility].

## 2018-10-11 ENCOUNTER — Encounter: Payer: Self-pay | Admitting: Internal Medicine

## 2018-11-04 ENCOUNTER — Ambulatory Visit: Payer: Medicare Other | Admitting: Podiatry

## 2018-11-04 ENCOUNTER — Encounter: Payer: Self-pay | Admitting: Podiatry

## 2018-11-04 ENCOUNTER — Other Ambulatory Visit: Payer: Self-pay

## 2018-11-04 ENCOUNTER — Ambulatory Visit (INDEPENDENT_AMBULATORY_CARE_PROVIDER_SITE_OTHER): Payer: Medicare Other | Admitting: Podiatry

## 2018-11-04 VITALS — Temp 98.0°F

## 2018-11-04 DIAGNOSIS — M79674 Pain in right toe(s): Secondary | ICD-10-CM

## 2018-11-04 DIAGNOSIS — L84 Corns and callosities: Secondary | ICD-10-CM | POA: Diagnosis not present

## 2018-11-04 DIAGNOSIS — B351 Tinea unguium: Secondary | ICD-10-CM

## 2018-11-04 DIAGNOSIS — M79675 Pain in left toe(s): Secondary | ICD-10-CM | POA: Diagnosis not present

## 2018-11-04 NOTE — Progress Notes (Signed)
Complaint:  Visit Type: Patient returns to my office for continued preventative foot care services. Complaint: Patient states" my nails have grown long and thick and become painful to walk and wear shoes" .  She presents to the office f or painful calluses both feet. The patient presents for preventative foot care services. No changes to ROS  Podiatric Exam: Vascular: dorsalis pedis and posterior tibial pulses are palpable bilateral. Capillary return is immediate. Temperature gradient is WNL. Skin turgor WNL  Sensorium: Normal Semmes Weinstein monofilament test. Normal tactile sensation bilaterally. Nail Exam: Pt has thick disfigured discolored nails with subungual debris noted bilateral entire nail hallux through fifth toenails Ulcer Exam: There is no evidence of ulcer or pre-ulcerative changes or infection. Orthopedic Exam: Muscle tone and strength are WNL. No limitations in general ROM. No crepitus or effusions noted. Foot type and digits show no abnormalities. HAV  B/L.  Pe planus  B/l. Skin: No Porokeratosis. No infection or ulcers.  Pinch callus left hallux.  Callus sub 1st MPJ right foot.  Diagnosis:  Onychomycosis, , Pain in right toe, pain in left toes  Callus  X 2  Treatment & Plan Procedures and Treatment: Consent by patient was obtained for treatment procedures.   Debridement of mycotic and hypertrophic toenails, 1 through 5 bilateral and clearing of subungual debris. No ulceration, no infection noted. Debride callus  B/l..  ABN signed for 2020.Return Visit-Office Procedure: Patient instructed to return to the office for a follow up visit 10 weeks  for continued evaluation and treatment.    Gardiner Barefoot DPM

## 2018-12-28 ENCOUNTER — Encounter: Payer: Self-pay | Admitting: Internal Medicine

## 2018-12-28 ENCOUNTER — Ambulatory Visit
Admission: RE | Admit: 2018-12-28 | Discharge: 2018-12-28 | Disposition: A | Discharge status: Home / Self Care | Payer: Medicare Other | Source: Ambulatory Visit | Attending: Internal Medicine | Admitting: Internal Medicine

## 2018-12-28 ENCOUNTER — Other Ambulatory Visit: Payer: Self-pay

## 2018-12-28 DIAGNOSIS — E2839 Other primary ovarian failure: Secondary | ICD-10-CM | POA: Diagnosis not present

## 2018-12-28 DIAGNOSIS — Z13820 Encounter for screening for osteoporosis: Secondary | ICD-10-CM | POA: Insufficient documentation

## 2018-12-28 DIAGNOSIS — M8580 Other specified disorders of bone density and structure, unspecified site: Secondary | ICD-10-CM | POA: Insufficient documentation

## 2018-12-28 DIAGNOSIS — M85832 Other specified disorders of bone density and structure, left forearm: Secondary | ICD-10-CM | POA: Diagnosis not present

## 2019-02-17 ENCOUNTER — Ambulatory Visit (INDEPENDENT_AMBULATORY_CARE_PROVIDER_SITE_OTHER): Payer: Medicare Other | Admitting: Podiatry

## 2019-02-17 ENCOUNTER — Other Ambulatory Visit: Payer: Self-pay

## 2019-02-17 ENCOUNTER — Encounter: Payer: Self-pay | Admitting: Podiatry

## 2019-02-17 VITALS — Temp 98.0°F

## 2019-02-17 DIAGNOSIS — L84 Corns and callosities: Secondary | ICD-10-CM

## 2019-02-17 DIAGNOSIS — M79674 Pain in right toe(s): Secondary | ICD-10-CM

## 2019-02-17 DIAGNOSIS — B351 Tinea unguium: Secondary | ICD-10-CM

## 2019-02-17 DIAGNOSIS — M79675 Pain in left toe(s): Secondary | ICD-10-CM

## 2019-02-17 NOTE — Progress Notes (Signed)
Complaint:  Visit Type: Patient returns to my office for continued preventative foot care services. Complaint: Patient states" my nails have grown long and thick and become painful to walk and wear shoes" .  She presents to the office f or painful calluses both feet. The patient presents for preventative foot care services. No changes to ROS  Podiatric Exam: Vascular: dorsalis pedis and posterior tibial pulses are palpable bilateral. Capillary return is immediate. Temperature gradient is WNL. Skin turgor WNL  Sensorium: Normal Semmes Weinstein monofilament test. Normal tactile sensation bilaterally. Nail Exam: Pt has thick disfigured discolored nails with subungual debris noted bilateral entire nail hallux through fifth toenails Ulcer Exam: There is no evidence of ulcer or pre-ulcerative changes or infection. Orthopedic Exam: Muscle tone and strength are WNL. No limitations in general ROM. No crepitus or effusions noted. Foot type and digits show no abnormalities. HAV  B/L.  Pe planus  B/l. Skin: No Porokeratosis. No infection or ulcers.  Pinch callus left hallux.  Callus sub 1st MPJ right foot.  Diagnosis:  Onychomycosis, , Pain in right toe, pain in left toes  Callus  X 2  Treatment & Plan Procedures and Treatment: Consent by patient was obtained for treatment procedures.   Debridement of mycotic and hypertrophic toenails, 1 through 5 bilateral and clearing of subungual debris. No ulceration, no infection noted. Debride callus  B/l..  ABN signed for 2020.Return Visit-Office Procedure: Patient instructed to return to the office for a follow up visit 10 weeks  for continued evaluation and treatment.    Gardiner Barefoot DPM

## 2019-03-16 ENCOUNTER — Other Ambulatory Visit: Payer: Self-pay

## 2019-03-16 ENCOUNTER — Ambulatory Visit (INDEPENDENT_AMBULATORY_CARE_PROVIDER_SITE_OTHER): Payer: Medicare Other | Admitting: Internal Medicine

## 2019-03-16 DIAGNOSIS — I4891 Unspecified atrial fibrillation: Secondary | ICD-10-CM

## 2019-03-16 DIAGNOSIS — E611 Iron deficiency: Secondary | ICD-10-CM | POA: Diagnosis not present

## 2019-03-16 DIAGNOSIS — Z1231 Encounter for screening mammogram for malignant neoplasm of breast: Secondary | ICD-10-CM

## 2019-03-16 DIAGNOSIS — Z0000 Encounter for general adult medical examination without abnormal findings: Secondary | ICD-10-CM

## 2019-03-16 DIAGNOSIS — Z13220 Encounter for screening for lipoid disorders: Secondary | ICD-10-CM

## 2019-03-16 DIAGNOSIS — Z1329 Encounter for screening for other suspected endocrine disorder: Secondary | ICD-10-CM

## 2019-03-16 DIAGNOSIS — N3000 Acute cystitis without hematuria: Secondary | ICD-10-CM | POA: Diagnosis not present

## 2019-03-16 DIAGNOSIS — Z1283 Encounter for screening for malignant neoplasm of skin: Secondary | ICD-10-CM | POA: Diagnosis not present

## 2019-03-16 DIAGNOSIS — L989 Disorder of the skin and subcutaneous tissue, unspecified: Secondary | ICD-10-CM | POA: Diagnosis not present

## 2019-03-16 DIAGNOSIS — R718 Other abnormality of red blood cells: Secondary | ICD-10-CM | POA: Diagnosis not present

## 2019-03-16 NOTE — Progress Notes (Signed)
Virtual Visit via Video Note  I connected with Kelli Smith   on 03/16/19 at 10:45 AM EDT by a video enabled telemedicine application and verified that I am speaking with the correct person using two identifiers.  Location patient: home Location provider:work or home office Persons participating in the virtual visit: patient, provider, pts daughter   I discussed the limitations of evaluation and management by telemedicine and the availability of in person appointments. The patient expressed understanding and agreed to proceed.   HPI: 1. Right nose lesion tip since 07/2018 and bleeds occasionally with scabbing and not healing nothing tried  2. No more issues with memory loss   ROS: See pertinent positives and negatives per HPI.  Past Medical History:  Diagnosis Date  . Arthritis    knees, right shoulder   . History of chicken pox   . UTI (urinary tract infection)     Past Surgical History:  Procedure Laterality Date  . [place]  . EYE SURGERY     cataract 2013/2014 b/l   . FOOT SURGERY     bunion an dhammer toe in 2009    Family History  Problem Relation Age of Onset  . Heart disease Mother        died when pt was 5 y.o   . Heart disease Father     SOCIAL HX: lives with daughter   Current Outpatient Medications:  .  CHOLECALCIFEROL PO, Take 1,000 Units by mouth daily. , Disp: , Rfl:  .  diclofenac sodium (VOLTAREN) 1 % GEL, Apply 2-4 g topically 4 (four) times daily. 2 grams upper arms/shoulders and 4 grams knees, Disp: 100 g, Rfl: 11 .  NON FORMULARY, Hemp Oil 500mg  daily, Disp: , Rfl:  .  Omega-3 Fatty Acids (FISH OIL) 1000 MG CAPS, Take by mouth daily. , Disp: , Rfl:  .  UNABLE TO FIND, Med Name: D6 100mg  daily, Disp: , Rfl:  .  UNABLE TO FIND, Med Name: B12 1053mcg daily, Disp: , Rfl:  .  vitamin B-12 (CYANOCOBALAMIN) 100 MCG tablet, Take 100 mcg by mouth daily., Disp: , Rfl:   EXAM:  VITALS per patient if applicable:  GENERAL: alert,  oriented, appears well and in no acute distress  HEENT: atraumatic, conjunttiva clear, no obvious abnormalities on inspection of external nose and ears  NECK: normal movements of the head and neck  LUNGS: on inspection no signs of respiratory distress, breathing rate appears normal, no obvious gross SOB, gasping or wheezing  CV: no obvious cyanosis  MS: moves all visible extremities without noticeable abnormality  Skin: right nasal tip vascular lesion ? Angioma vs other   PSYCH/NEURO: pleasant and cooperative, no obvious depression or anxiety, speech and thought processing grossly intact  ASSESSMENT AND PLAN:  Discussed the following assessment and plan:  Skin lesion of face - Plan: Ambulatory referral to Dermatology also for tbse  Microcytosis - Plan: CBC w/Diff, IBC + Ferritin  Acute cystitis without hematuria - Plan: Urinalysis, Routine w reflex microscopic, Urine Culture  HM Flu shot had 04/22/18 and prevnar -will do flu shot high dose with labs 05/2019   Consider pna 23 in future if has not had in 1 year 03/2019  Consider shingrix vaccine and Tdap   Never smoker  Pap out of age window  cologuard neg 09/2018  Mammogram negative 08/2018   dexa 12/2018 osteopenia on calcium 1200 mg qd and vitamin D3 1000 to 2000 iu daily  Never had  colonoscopy     I discussed the assessment and treatment plan with the patient. The patient was provided an opportunity to ask questions and all were answered. The patient agreed with the plan and demonstrated an understanding of the instructions.   The patient was advised to call back or seek an in-person evaluation if the symptoms worsen or if the condition fails to improve as anticipated.  Time spent 15 mniutes  [name], MD

## 2019-03-25 DIAGNOSIS — D2262 Melanocytic nevi of left upper limb, including shoulder: Secondary | ICD-10-CM | POA: Diagnosis not present

## 2019-03-25 DIAGNOSIS — C44311 Basal cell carcinoma of skin of nose: Secondary | ICD-10-CM | POA: Diagnosis not present

## 2019-03-25 DIAGNOSIS — D2272 Melanocytic nevi of left lower limb, including hip: Secondary | ICD-10-CM | POA: Diagnosis not present

## 2019-03-25 DIAGNOSIS — L821 Other seborrheic keratosis: Secondary | ICD-10-CM | POA: Diagnosis not present

## 2019-03-25 DIAGNOSIS — D2261 Melanocytic nevi of right upper limb, including shoulder: Secondary | ICD-10-CM | POA: Diagnosis not present

## 2019-03-25 DIAGNOSIS — X32XXXA Exposure to sunlight, initial encounter: Secondary | ICD-10-CM | POA: Diagnosis not present

## 2019-03-25 DIAGNOSIS — D485 Neoplasm of uncertain behavior of skin: Secondary | ICD-10-CM | POA: Diagnosis not present

## 2019-03-25 DIAGNOSIS — D2271 Melanocytic nevi of right lower limb, including hip: Secondary | ICD-10-CM | POA: Diagnosis not present

## 2019-03-25 DIAGNOSIS — L570 Actinic keratosis: Secondary | ICD-10-CM | POA: Diagnosis not present

## 2019-03-25 DIAGNOSIS — D225 Melanocytic nevi of trunk: Secondary | ICD-10-CM | POA: Diagnosis not present

## 2019-04-08 ENCOUNTER — Encounter: Payer: Self-pay | Admitting: Internal Medicine

## 2019-04-28 ENCOUNTER — Ambulatory Visit: Payer: Medicare Other | Admitting: Podiatry

## 2019-05-09 ENCOUNTER — Encounter: Payer: Self-pay | Admitting: Podiatry

## 2019-05-09 ENCOUNTER — Ambulatory Visit (INDEPENDENT_AMBULATORY_CARE_PROVIDER_SITE_OTHER): Payer: Medicare Other | Admitting: Podiatry

## 2019-05-09 ENCOUNTER — Other Ambulatory Visit: Payer: Self-pay

## 2019-05-09 DIAGNOSIS — B351 Tinea unguium: Secondary | ICD-10-CM | POA: Diagnosis not present

## 2019-05-09 DIAGNOSIS — M79674 Pain in right toe(s): Secondary | ICD-10-CM

## 2019-05-09 DIAGNOSIS — L84 Corns and callosities: Secondary | ICD-10-CM | POA: Diagnosis not present

## 2019-05-09 DIAGNOSIS — M79675 Pain in left toe(s): Secondary | ICD-10-CM | POA: Diagnosis not present

## 2019-05-09 NOTE — Progress Notes (Signed)
Complaint:  Visit Type: Patient returns to my office for continued preventative foot care services. Complaint: Patient states" my nails have grown long and thick and become painful to walk and wear shoes" .  She presents to the office f or painful calluses both feet. The patient presents for preventative foot care services. No changes to ROS  Podiatric Exam: Vascular: dorsalis pedis and posterior tibial pulses are palpable bilateral. Capillary return is immediate. Temperature gradient is WNL. Skin turgor WNL  Sensorium: Normal Semmes Weinstein monofilament test. Normal tactile sensation bilaterally. Nail Exam: Pt has thick disfigured discolored nails with subungual debris noted bilateral entire nail hallux through fifth toenails Ulcer Exam: There is no evidence of ulcer or pre-ulcerative changes or infection. Orthopedic Exam: Muscle tone and strength are WNL. No limitations in general ROM. No crepitus or effusions noted. Foot type and digits show no abnormalities. HAV  B/L.  Pe planus  B/l. Skin: No Porokeratosis. No infection or ulcers.  Pinch callus left hallux.  Callus sub 1st MPJ right foot. Clavi 2 right and 3 left.  Diagnosis:  Onychomycosis, , Pain in right toe, pain in left toes  Callus  X 4  Treatment & Plan Procedures and Treatment: Consent by patient was obtained for treatment procedures.   Debridement of mycotic and hypertrophic toenails, 1 through 5 bilateral and clearing of subungual debris. No ulceration, no infection noted. Debride callus  B/l.Marland Kitchen Marland KitchenReturn Visit-Office Procedure: Patient instructed to return to the office for a follow up visit 10 weeks  for continued evaluation and treatment.    Gardiner Barefoot DPM

## 2019-05-16 DIAGNOSIS — L578 Other skin changes due to chronic exposure to nonionizing radiation: Secondary | ICD-10-CM | POA: Diagnosis not present

## 2019-05-16 DIAGNOSIS — C44311 Basal cell carcinoma of skin of nose: Secondary | ICD-10-CM | POA: Diagnosis not present

## 2019-05-16 DIAGNOSIS — L814 Other melanin hyperpigmentation: Secondary | ICD-10-CM | POA: Diagnosis not present

## 2019-05-16 DIAGNOSIS — L988 Other specified disorders of the skin and subcutaneous tissue: Secondary | ICD-10-CM | POA: Diagnosis not present

## 2019-05-26 ENCOUNTER — Ambulatory Visit (INDEPENDENT_AMBULATORY_CARE_PROVIDER_SITE_OTHER): Payer: Medicare Other

## 2019-05-26 ENCOUNTER — Other Ambulatory Visit: Payer: Self-pay

## 2019-05-26 DIAGNOSIS — Z23 Encounter for immunization: Secondary | ICD-10-CM | POA: Diagnosis not present

## 2019-05-26 DIAGNOSIS — Z0000 Encounter for general adult medical examination without abnormal findings: Secondary | ICD-10-CM | POA: Diagnosis not present

## 2019-05-26 NOTE — Patient Instructions (Addendum)
  Kelli Smith , Thank you for taking time to come for your Medicare Wellness Visit. I appreciate your ongoing commitment to your health goals. Please review the following plan we discussed and let me know if I can assist you in the future.   These are the goals we discussed: Goals    . Follow up with Primary Care Provider     As needed       This is a list of the screening recommended for you and due dates:  Health Maintenance  Topic Date Due  . Tetanus Vaccine  06/18/1960  . Pneumonia vaccines (2 of 2 - PPSV23) 04/23/2019  . Flu Shot  10/26/2019*  . DEXA scan (bone density measurement)  Completed  *Topic was postponed. The date shown is not the original due date.

## 2019-05-26 NOTE — Progress Notes (Signed)
Subjective:   Kelli Smith is a 78 y.o. female who presents for an Initial Medicare Annual Wellness Visit.  Review of Systems    No ROS.  Medicare Wellness Virtual Visit.  Visual/audio telehealth visit, UTA vital signs.   See social history for additional risk factors.    Cardiac Risk Factors include: advanced age (>51men, >22 women)     Objective:    Today's Vitals   There is no height or weight on file to calculate BMI.  Advanced Directives 05/26/2019  Does Patient Have a Medical Advance Directive? No  Does patient want to make changes to medical advance directive? No - Patient declined    Current Medications (verified) Outpatient Encounter Medications as of 05/26/2019  Medication Sig  . CHOLECALCIFEROL PO Take 1,000 Units by mouth daily.   . diclofenac sodium (VOLTAREN) 1 % GEL Apply 2-4 g topically 4 (four) times daily. 2 grams upper arms/shoulders and 4 grams knees  . NON FORMULARY Hemp Oil 500mg  daily  . Omega-3 Fatty Acids (FISH OIL) 1000 MG CAPS Take by mouth daily.   Marland Kitchen UNABLE TO FIND Med Name: D6 100mg  daily  . UNABLE TO FIND Med Name: B12 1036mcg daily  . vitamin B-12 (CYANOCOBALAMIN) 100 MCG tablet Take 100 mcg by mouth daily.   No facility-administered encounter medications on file as of 05/26/2019.     Allergies (verified) Patient has no known allergies.   History: Past Medical History:  Diagnosis Date  . Arthritis    knees, right shoulder   . History of chicken pox   . UTI (urinary tract infection)    Past Surgical History:  Procedure Laterality Date  . [place]  . EYE SURGERY     cataract 2013/2014 b/l   . FOOT SURGERY     bunion an dhammer toe in 2009   Family History  Problem Relation Age of Onset  . Heart disease Mother        died when pt was 33 y.o   . Heart disease Father    Social History   Socioeconomic History  . Marital status: Widowed    Spouse name: Not on file  . Number of children: Not on file  .  Years of education: Not on file  . Highest education level: Not on file  Occupational History  . Not on file  Social Needs  . Financial resource strain: Not hard at all  . Food insecurity    Worry: Never true    Inability: Never true  . Transportation needs    Medical: No    Non-medical: No  Tobacco Use  . Smoking status: Never Smoker  . Smokeless tobacco: Never Used  Substance and Sexual Activity  . Alcohol use: Yes    Alcohol/week: 1.0 - 2.0 standard drinks    Types: 1 - 2 Glasses of wine per week    Comment: occasional drinker  . Drug use: Never  . Sexual activity: Not on file  Lifestyle  . Physical activity    Days per week: 1 day    Minutes per session: 20 min  . Stress: Not at all  Relationships  . Social Herbalist on phone: Not on file    Gets together: Not on file    Attends religious service: Not on file    Active member of club or organization: Not on file    Attends meetings of clubs or organizations: Not on file  Relationship status: Not on file  Other Topics Concern  . Not on file  Social History Narrative   From [place] lived in [place] since late 1990s early 2000    Lives with daughter    Secretary/administrator ed    Former Pharmacist, hospital    No guns, wears seat belt, safe in relationship    Widowed       2 daughters 1/2 in [place] given: Not Answered   Clinical Intake:  Pre-visit preparation completed: Yes        Diabetes: No  How often do you need to have someone help you when you read instructions, pamphlets, or other written materials from your doctor or pharmacy?: 3 - Sometimes  Interpreter Needed?: No      Activities of Daily Living In your present state of health, do you have any difficulty performing the following activities: 05/26/2019  Hearing? N  Vision? N  Difficulty concentrating or making decisions? N  Walking or climbing stairs? N  Dressing or bathing? N  Doing errands, shopping? Y   Comment Daughter accompanies her.  Preparing Food and eating ? N  Using the Toilet? N  In the past six months, have you accidently leaked urine? N  Do you have problems with loss of bowel control? N  Managing your Medications? N  Managing your Finances? N  Housekeeping or managing your Housekeeping? N  Some recent data might be hidden     Immunizations and Health Maintenance Immunization History  Administered Date(s) Administered  . Fluad Quad(high Dose 65+) 05/26/2019  . Influenza, High Dose Seasonal PF 04/22/2018  . Pneumococcal Conjugate-13 04/22/2018   Health Maintenance Due  Topic Date Due  . TETANUS/TDAP  06/18/1959  . PNA vac Low Risk Adult (2 of 2 - PPSV23) 04/23/2019    Patient Care Team: McLean-Scocuzza, Nino Glow, MD as PCP - General (Internal Medicine)  Indicate any recent Medical Services you may have received from other than [facility] providers in the past year (date may be approximate).     Assessment:   This is a routine wellness examination for [facility].  Nurse connected with patient 05/26/19 at  1:00 PM EDT by a telephone enabled telemedicine application and verified that I am speaking with the correct person using two identifiers. Patient stated full name and DOB. Patient gave permission to continue with virtual visit. Patient's location was at home and Nurse's location was at [place] office.   Health Maintenance Due: -Influenza vaccine 2020- discussed; to be completed in office today.    -PNA - discussed; to be completed with doctor in visit or local pharmacy.  -Tdap- discussed; to be completed with doctor in visit or local pharmacy.   Update all pending maintenance due as appropriate.   See completed HM at the end of note.   Eye: Visual acuity not assessed. Virtual visit. Followed by their ophthalmologist.  Dental: Dentures- yes  Hearing: Demonstrates normal hearing during visit.  Safety:  Patient feels safe at home- yes Patient does have smoke  detectors at home- yes Patient does wear sunscreen or protective clothing when in direct sunlight - yes Patient does wear seat belt when in a moving vehicle - yes Patient drives- no Adequate lighting in walkways free from debris- yes Grab bars and handrails used as appropriate- yes Ambulates with cane as needed as an assistive device  Social: Alcohol intake - yes      Smoking history- never   Smokers in home? none  Illicit drug use? none  Depression: PHQ 2 &9 complete. See screening below. Denies irritability, anhedonia, sadness/tearfullness.    Falls: See screening below.    Medication: Taking as directed and without issues.   Covid-19: Precautions and sickness symptoms discussed. Wears mask, social distancing, hand hygiene as appropriate.   Activities of Daily Living Patient denies needing assistance with: household chores, feeding themselves, getting from bed to chair, getting to the toilet, bathing/showering, dressing, managing money, or preparing meals.  Assisted by daughter with  as needed.   Memory: Patient is alert. Patient denies difficulty focusing or concentrating. Correctly identified the president of the [place], season and recall.  Patient likes to read, play computer games, complete puzzles for brain stimulation.   BMI- discussed the importance of a healthy diet, water intake and the benefits of aerobic exercise.  Educational material provided.  Physical activity- floor exercises, walking, no routine.   Diet:  Regular Water: good intake  Other Providers Patient Care Team: McLean-Scocuzza, Nino Glow, MD as PCP - General (Internal Medicine)  Hearing/Vision screen  Hearing Screening   125Hz  250Hz  500Hz  1000Hz  2000Hz  3000Hz  4000Hz  6000Hz  8000Hz   Right ear:           Left ear:           Comments: Patient is able to hear conversational tones without difficulty.  No issues reported.  Vision Screening Comments: Visual acuity not assessed, virtual visit.        Dietary issues and exercise activities discussed: Current Exercise Habits: Home exercise routine, Intensity: Mild  Goals    . Follow up with Primary Care Provider     As needed      Depression Screen PHQ 2/9 Scores 05/26/2019 09/15/2018  PHQ - 2 Score 0 0    Fall Risk Fall Risk  05/26/2019 09/15/2018  Falls in the past year? 0 0   Timed Get Up and Go Performed no, virtual visit  Cognitive Function:        Screening Tests Health Maintenance  Topic Date Due  . TETANUS/TDAP  06/18/1959  . PNA vac Low Risk Adult (2 of 2 - PPSV23) 04/23/2019  . INFLUENZA VACCINE  10/26/2019 (Originally 02/26/2019)  . DEXA SCAN  Completed     Plan:   Keep all routine maintenance appointments.   Influenza vaccine today @ nurse visit.   Next scheduled lab 06/17/19 @ 8:15  Follow up 09/20/19 @ 1030.  Medicare Attestation I have personally reviewed: The patient's medical and social history Their use of alcohol, tobacco or illicit drugs Their current medications and supplements The patient's functional ability including ADLs,fall risks, home safety risks, cognitive, and hearing and visual impairment Diet and physical activities Evidence for depression   In addition, I have reviewed and discussed with patient certain preventive protocols, quality metrics, and best practice recommendations. A written personalized care plan for preventive services as well as general preventive health recommendations were provided to patient via mail.     Varney Biles, LPN   [id number]

## 2019-06-15 ENCOUNTER — Other Ambulatory Visit: Payer: Self-pay

## 2019-06-17 ENCOUNTER — Other Ambulatory Visit: Payer: Self-pay

## 2019-06-17 ENCOUNTER — Ambulatory Visit: Payer: Medicare Other

## 2019-06-17 ENCOUNTER — Other Ambulatory Visit (INDEPENDENT_AMBULATORY_CARE_PROVIDER_SITE_OTHER): Payer: Medicare Other

## 2019-06-17 DIAGNOSIS — Z1329 Encounter for screening for other suspected endocrine disorder: Secondary | ICD-10-CM | POA: Diagnosis not present

## 2019-06-17 DIAGNOSIS — Z13220 Encounter for screening for lipoid disorders: Secondary | ICD-10-CM | POA: Diagnosis not present

## 2019-06-17 DIAGNOSIS — N3000 Acute cystitis without hematuria: Secondary | ICD-10-CM | POA: Diagnosis not present

## 2019-06-17 DIAGNOSIS — Z23 Encounter for immunization: Secondary | ICD-10-CM | POA: Diagnosis not present

## 2019-06-17 DIAGNOSIS — Z0000 Encounter for general adult medical examination without abnormal findings: Secondary | ICD-10-CM | POA: Diagnosis not present

## 2019-06-17 DIAGNOSIS — R718 Other abnormality of red blood cells: Secondary | ICD-10-CM | POA: Diagnosis not present

## 2019-06-17 DIAGNOSIS — E611 Iron deficiency: Secondary | ICD-10-CM

## 2019-06-17 LAB — CBC WITH DIFFERENTIAL/PLATELET
Basophils Absolute: 0 10*3/uL (ref 0.0–0.1)
Basophils Relative: 0.3 % (ref 0.0–3.0)
Eosinophils Absolute: 0.1 10*3/uL (ref 0.0–0.7)
Eosinophils Relative: 1.5 % (ref 0.0–5.0)
HCT: 41 % (ref 36.0–46.0)
Hemoglobin: 13 g/dL (ref 12.0–15.0)
Lymphocytes Relative: 26 % (ref 12.0–46.0)
Lymphs Abs: 2.1 10*3/uL (ref 0.7–4.0)
MCHC: 31.6 g/dL (ref 30.0–36.0)
MCV: 73.7 fl — ABNORMAL LOW (ref 78.0–100.0)
Monocytes Absolute: 0.4 10*3/uL (ref 0.1–1.0)
Monocytes Relative: 5.5 % (ref 3.0–12.0)
Neutro Abs: 5.5 10*3/uL (ref 1.4–7.7)
Neutrophils Relative %: 66.7 % (ref 43.0–77.0)
Platelets: 206 10*3/uL (ref 150.0–400.0)
RBC: 5.55 Mil/uL — ABNORMAL HIGH (ref 3.87–5.11)
RDW: 14.9 % (ref 11.5–15.5)
WBC: 8.2 10*3/uL (ref 4.0–10.5)

## 2019-06-17 LAB — IBC + FERRITIN
Ferritin: 91.4 ng/mL (ref 10.0–291.0)
Iron: 91 ug/dL (ref 42–145)
Saturation Ratios: 30.2 % (ref 20.0–50.0)
Transferrin: 215 mg/dL (ref 212.0–360.0)

## 2019-06-17 LAB — COMPREHENSIVE METABOLIC PANEL
ALT: 13 U/L (ref 0–35)
AST: 17 U/L (ref 0–37)
Albumin: 3.9 g/dL (ref 3.5–5.2)
Alkaline Phosphatase: 77 U/L (ref 39–117)
BUN: 17 mg/dL (ref 6–23)
CO2: 26 mEq/L (ref 19–32)
Calcium: 9.1 mg/dL (ref 8.4–10.5)
Chloride: 105 mEq/L (ref 96–112)
Creatinine, Ser: 0.61 mg/dL (ref 0.40–1.20)
GFR: 94.61 mL/min (ref >60.00)
Glucose, Bld: 85 mg/dL (ref 70–99)
Potassium: 3.3 mEq/L — ABNORMAL LOW (ref 3.5–5.1)
Sodium: 142 mEq/L (ref 135–145)
Total Bilirubin: 0.6 mg/dL (ref 0.2–1.2)
Total Protein: 6.9 g/dL (ref 6.0–8.3)

## 2019-06-17 LAB — LIPID PANEL
Cholesterol: 173 mg/dL (ref 0–200)
HDL: 66.9 mg/dL (ref >39.00)
LDL Cholesterol: 90 mg/dL (ref 0–99)
NonHDL: 106.31
Total CHOL/HDL Ratio: 3
Triglycerides: 82 mg/dL (ref 0.0–149.0)
VLDL: 16.4 mg/dL (ref 0.0–40.0)

## 2019-06-17 LAB — TSH: TSH: 0.73 u[IU]/mL (ref 0.35–4.50)

## 2019-06-18 LAB — URINALYSIS, ROUTINE W REFLEX MICROSCOPIC
Bacteria, UA: NONE SEEN /HPF
Bilirubin Urine: NEGATIVE
Glucose, UA: NEGATIVE
Hyaline Cast: NONE SEEN /LPF
Ketones, ur: NEGATIVE
Leukocytes,Ua: NEGATIVE
Nitrite: NEGATIVE
Specific Gravity, Urine: 1.021 (ref 1.001–1.03)
pH: 6 (ref 5.0–8.0)

## 2019-06-18 LAB — URINE CULTURE
MICRO NUMBER:: 1124420
SPECIMEN QUALITY:: ADEQUATE

## 2019-06-20 ENCOUNTER — Other Ambulatory Visit: Payer: Self-pay | Admitting: Internal Medicine

## 2019-06-20 DIAGNOSIS — R319 Hematuria, unspecified: Secondary | ICD-10-CM

## 2019-06-20 DIAGNOSIS — N938 Other specified abnormal uterine and vaginal bleeding: Secondary | ICD-10-CM

## 2019-06-20 DIAGNOSIS — C44311 Basal cell carcinoma of skin of nose: Secondary | ICD-10-CM

## 2019-06-20 DIAGNOSIS — C4491 Basal cell carcinoma of skin, unspecified: Secondary | ICD-10-CM | POA: Insufficient documentation

## 2019-07-05 ENCOUNTER — Ambulatory Visit: Payer: Medicare Other

## 2019-07-08 ENCOUNTER — Other Ambulatory Visit: Payer: Self-pay

## 2019-07-08 ENCOUNTER — Ambulatory Visit
Admission: RE | Admit: 2019-07-08 | Discharge: 2019-07-08 | Disposition: A | Discharge status: Home / Self Care | Payer: Medicare Other | Source: Ambulatory Visit | Attending: Internal Medicine | Admitting: Internal Medicine

## 2019-07-08 DIAGNOSIS — R319 Hematuria, unspecified: Secondary | ICD-10-CM

## 2019-07-08 DIAGNOSIS — N938 Other specified abnormal uterine and vaginal bleeding: Secondary | ICD-10-CM | POA: Insufficient documentation

## 2019-07-18 ENCOUNTER — Ambulatory Visit (INDEPENDENT_AMBULATORY_CARE_PROVIDER_SITE_OTHER): Payer: Medicare Other | Admitting: Podiatry

## 2019-07-18 ENCOUNTER — Encounter: Payer: Self-pay | Admitting: Podiatry

## 2019-07-18 ENCOUNTER — Other Ambulatory Visit: Payer: Self-pay

## 2019-07-18 DIAGNOSIS — B351 Tinea unguium: Secondary | ICD-10-CM | POA: Diagnosis not present

## 2019-07-18 DIAGNOSIS — M79675 Pain in left toe(s): Secondary | ICD-10-CM

## 2019-07-18 DIAGNOSIS — L84 Corns and callosities: Secondary | ICD-10-CM

## 2019-07-18 DIAGNOSIS — M79674 Pain in right toe(s): Secondary | ICD-10-CM

## 2019-07-18 NOTE — Progress Notes (Signed)
Complaint:  Visit Type: Patient returns to my office for continued preventative foot care services. Complaint: Patient states" my nails have grown long and thick and become painful to walk and wear shoes" .  She presents to the office f or painful calluses both feet. The patient presents for preventative foot care services. No changes to ROS  Podiatric Exam: Vascular: dorsalis pedis and posterior tibial pulses are palpable bilateral. Capillary return is immediate. Temperature gradient is WNL. Skin turgor WNL  Sensorium: Normal Semmes Weinstein monofilament test. Normal tactile sensation bilaterally. Nail Exam: Pt has thick disfigured discolored nails with subungual debris noted bilateral entire nail hallux through fifth toenails Ulcer Exam: There is no evidence of ulcer or pre-ulcerative changes or infection. Orthopedic Exam: Muscle tone and strength are WNL. No limitations in general ROM. No crepitus or effusions noted. Foot type and digits show no abnormalities. HAV  B/L.  Pe planus  B/l. Skin: No Porokeratosis. No infection or ulcers.  Pinch callus left hallux.  Callus sub 1st MPJ right foot. Clavi 2 right and 3 left.  Diagnosis:  Onychomycosis, , Pain in right toe, pain in left toes  Callus  X 4  Treatment & Plan Procedures and Treatment: Consent by patient was obtained for treatment procedures.   Debridement of mycotic and hypertrophic toenails, 1 through 5 bilateral and clearing of subungual debris. No ulceration, no infection noted. Debride callus  B/l.Marland Kitchen Marland KitchenReturn Visit-Office Procedure: Patient instructed to return to the office for a follow up visit 10 weeks  for continued evaluation and treatment.    Gardiner Barefoot DPM

## 2019-08-06 ENCOUNTER — Encounter: Payer: Self-pay | Admitting: Internal Medicine

## 2019-08-08 ENCOUNTER — Other Ambulatory Visit: Payer: Self-pay | Admitting: Internal Medicine

## 2019-08-08 DIAGNOSIS — I3139 Other pericardial effusion (noninflammatory): Secondary | ICD-10-CM

## 2019-08-08 DIAGNOSIS — R319 Hematuria, unspecified: Secondary | ICD-10-CM

## 2019-08-08 DIAGNOSIS — K8020 Calculus of gallbladder without cholecystitis without obstruction: Secondary | ICD-10-CM

## 2019-08-08 DIAGNOSIS — N281 Cyst of kidney, acquired: Secondary | ICD-10-CM

## 2019-08-08 DIAGNOSIS — D3500 Benign neoplasm of unspecified adrenal gland: Secondary | ICD-10-CM

## 2019-08-08 DIAGNOSIS — I313 Pericardial effusion (noninflammatory): Secondary | ICD-10-CM

## 2019-08-11 ENCOUNTER — Other Ambulatory Visit: Payer: Self-pay

## 2019-08-11 ENCOUNTER — Ambulatory Visit
Admission: RE | Admit: 2019-08-11 | Discharge: 2019-08-11 | Disposition: A | Discharge status: Home / Self Care | Payer: Medicare Other | Source: Ambulatory Visit | Attending: Internal Medicine | Admitting: Internal Medicine

## 2019-08-11 DIAGNOSIS — D3500 Benign neoplasm of unspecified adrenal gland: Secondary | ICD-10-CM | POA: Diagnosis not present

## 2019-08-11 DIAGNOSIS — R319 Hematuria, unspecified: Secondary | ICD-10-CM

## 2019-08-11 DIAGNOSIS — K8020 Calculus of gallbladder without cholecystitis without obstruction: Secondary | ICD-10-CM

## 2019-08-11 DIAGNOSIS — N281 Cyst of kidney, acquired: Secondary | ICD-10-CM

## 2019-08-11 DIAGNOSIS — D3502 Benign neoplasm of left adrenal gland: Secondary | ICD-10-CM | POA: Diagnosis not present

## 2019-08-11 LAB — POCT I-STAT CREATININE: Creatinine, Ser: 0.6 mg/dL (ref 0.44–1.00)

## 2019-08-11 MED ORDER — GADOBUTROL 1 MMOL/ML IV SOLN
6.0000 mL | Freq: Once | INTRAVENOUS | Status: AC | PRN
  Administered 2019-08-11: 6 mL via INTRAVENOUS

## 2019-09-06 ENCOUNTER — Other Ambulatory Visit: Payer: Self-pay

## 2019-09-06 ENCOUNTER — Ambulatory Visit
Admission: RE | Admit: 2019-09-06 | Discharge: 2019-09-06 | Disposition: A | Discharge status: Home / Self Care | Payer: Medicare Other | Source: Ambulatory Visit | Attending: Internal Medicine | Admitting: Internal Medicine

## 2019-09-06 DIAGNOSIS — I3139 Other pericardial effusion (noninflammatory): Secondary | ICD-10-CM

## 2019-09-06 DIAGNOSIS — I517 Cardiomegaly: Secondary | ICD-10-CM | POA: Diagnosis not present

## 2019-09-06 DIAGNOSIS — I313 Pericardial effusion (noninflammatory): Secondary | ICD-10-CM

## 2019-09-06 NOTE — Progress Notes (Signed)
*  PRELIMINARY RESULTS* Echocardiogram 2D Echocardiogram has been performed.  [name] 09/06/2019, 10:46 AM

## 2019-09-08 DIAGNOSIS — I4891 Unspecified atrial fibrillation: Secondary | ICD-10-CM | POA: Insufficient documentation

## 2019-09-08 NOTE — Addendum Note (Signed)
Addended by: Orland Mustard on: 09/08/2019 12:55 AM   Modules accepted: Orders

## 2019-09-12 DIAGNOSIS — Z23 Encounter for immunization: Secondary | ICD-10-CM | POA: Diagnosis not present

## 2019-09-16 ENCOUNTER — Ambulatory Visit (INDEPENDENT_AMBULATORY_CARE_PROVIDER_SITE_OTHER): Payer: Medicare Other

## 2019-09-16 ENCOUNTER — Encounter: Payer: Self-pay | Admitting: Cardiology

## 2019-09-16 ENCOUNTER — Ambulatory Visit (INDEPENDENT_AMBULATORY_CARE_PROVIDER_SITE_OTHER): Payer: Medicare Other | Admitting: Cardiology

## 2019-09-16 ENCOUNTER — Other Ambulatory Visit: Payer: Self-pay

## 2019-09-16 VITALS — BP 140/70 | HR 109 | Ht 63.0 in | Wt 167.8 lb

## 2019-09-16 DIAGNOSIS — I499 Cardiac arrhythmia, unspecified: Secondary | ICD-10-CM | POA: Diagnosis not present

## 2019-09-16 DIAGNOSIS — R9431 Abnormal electrocardiogram [ECG] [EKG]: Secondary | ICD-10-CM | POA: Diagnosis not present

## 2019-09-16 NOTE — Patient Instructions (Signed)
Medication Instructions:  - Your physician recommends that you continue on your current medications as directed. Please refer to the Current Medication list given to you today.  *If you need a refill on your cardiac medications before your next appointment, please call your pharmacy*  Lab Work: - none ordered  If you have labs (blood work) drawn today and your tests are completely normal, you will receive your results only by: Marland Kitchen MyChart Message (if you have MyChart) OR . A paper copy in the mail If you have any lab test that is abnormal or we need to change your treatment, we will call you to review the results.  Testing/Procedures: - Your physician has recommended that you wear a 14 day Zio monitor (placed in office today). This monitor is a medical device that records the heart's electrical activity. Doctors most often use these monitors to diagnose arrhythmias. Arrhythmias are problems with the speed or rhythm of the heartbeat. The monitor is a small device applied to your chest. You can wear one while you do your normal daily activities. While wearing this monitor if you have any symptoms to push the button and record what you felt. Once you have worn this monitor for the period of time provider prescribed (Usually 14 days), you will return the monitor device in the postage paid box. Once it is returned they will download the data collected and provide [place] with a report which the provider will then review and we will call you with those results. Important tips:  1. Avoid showering during the first 24 hours of wearing the monitor. 2. Avoid excessive sweating to help maximize wear time. 3. Do not submerge the device, no hot tubs, and no swimming pools. 4. Keep any lotions or oils away from the patch. 5. After 24 hours you may shower with the patch on. Take brief showers with your back facing the shower head.  6. Do not remove patch once it has been placed because that will interrupt data and  decrease adhesive wear time. 7. Push the button when you have any symptoms and write down what you were feeling. 8. Once you have completed wearing your monitor, remove and place into box which has postage paid and place in your outgoing mailbox.  9. If for some reason you have misplaced your box then call our office and we can provide another box and/or mail it off for you.        Follow-Up: At [facility], you and your health needs are our priority.  As part of our continuing mission to provide you with exceptional heart care, we have created designated Provider Care Teams.  These Care Teams include your primary Cardiologist (physician) and Advanced Practice Providers (APPs -  Physician Assistants and Nurse Practitioners) who all work together to provide you with the care you need, when you need it.  Your next appointment:   4 week(s)  The format for your next appointment:   In Person  Provider:   Kate Sable, MD  Other Instructions n/a

## 2019-09-16 NOTE — Progress Notes (Signed)
Cardiology Office Note:    Date:  09/16/2019   ID:  Kelli Smith, DOB 04-11-1941, MRN [id number]  PCP:  McLean-Scocuzza, Nino Glow, MD  Cardiologist:  No primary care provider on file.  Electrophysiologist:  None   Referring MD: McLean-Scocuzza, Olivia Mackie *   Chief Complaint  Patient presents with  . OTHER    AFIB no complaints today. Meds reviewed verbally with pt.    History of Present Illness:    Kelli Smith is a 79 y.o. female with a hx of arthritis who presents due to abnormal heart rhythm.  Patient has a history of kidney stones.  Underwent a CT scanning to evaluate for kidney stones presents.  A small pericardial effusion was noted which prompted patient to have an echocardiogram.  Echocardiogram on 08/2019 showed normal ejection fraction with EF 60 to 65%, mild LVH, indeterminate diastolic parameters.  During the echocardiogram, irregular heart was noted suggesting atrial fibrillation.  Patient denies chest pain, palpitations, previous cardiac history.  Past Medical History:  Diagnosis Date  . Arthritis    knees, right shoulder   . History of chicken pox   . UTI (urinary tract infection)     Past Surgical History:  Procedure Laterality Date  . [place]  . EYE SURGERY     cataract 2013/2014 b/l   . FOOT SURGERY     bunion an dhammer toe in 2009    Current Medications: Current Meds  Medication Sig  . Calcium-Magnesium-Vitamin D (CALCIUM 1200+D3 PO) daily.   . CHOLECALCIFEROL PO Take 1,000 Units by mouth daily.   . diclofenac sodium (VOLTAREN) 1 % GEL Apply 2-4 g topically 4 (four) times daily. 2 grams upper arms/shoulders and 4 grams knees  . Ferrous Sulfate (IRON) 325 (65 Fe) MG TABS daily.   . NON FORMULARY Hemp Oil 500mg  daily  . Omega-3 Fatty Acids (FISH OIL) 1000 MG CAPS Take by mouth daily.   Marland Kitchen UNABLE TO FIND Med Name: D6 100mg  daily  . vitamin B-12 (CYANOCOBALAMIN) 100 MCG tablet Take 100 mcg by mouth daily.     Allergies:   Patient has  no known allergies.   Social History   Socioeconomic History  . Marital status: Widowed    Spouse name: Not on file  . Number of children: Not on file  . Years of education: Not on file  . Highest education level: Not on file  Occupational History  . Not on file  Tobacco Use  . Smoking status: Never Smoker  . Smokeless tobacco: Never Used  Substance and Sexual Activity  . Alcohol use: Yes    Alcohol/week: 1.0 - 2.0 standard drinks    Types: 1 - 2 Glasses of wine per week    Comment: occasional drinker  . Drug use: Never  . Sexual activity: Not on file  Other Topics Concern  . Not on file  Social History Narrative   From [place] lived in [place] since late 1990s early 2000    Lives with daughter    Secretary/administrator ed    Former Pharmacist, hospital    No guns, wears seat belt, safe in relationship    Widowed       2 daughters 1/2 in [employer]    Social Determinants of Health   Financial Resource Strain: Low Risk   . Difficulty of Paying Living Expenses: Not hard at all  Food Insecurity: No Food Insecurity  . Worried About Charity fundraiser in the Last Year: Never  true  . Ran Out of Food in the Last Year: Never true  Transportation Needs: No Transportation Needs  . Lack of Transportation (Medical): No  . Lack of Transportation (Non-Medical): No  Physical Activity: Insufficiently Active  . Days of Exercise per Week: 1 day  . Minutes of Exercise per Session: 20 min  Stress: No Stress Concern Present  . Feeling of Stress : Not at all  Social Connections:   . Frequency of Communication with Friends and Family: Not on file  . Frequency of Social Gatherings with Friends and Family: Not on file  . Attends Religious Services: Not on file  . Active Member of Clubs or Organizations: Not on file  . Attends Archivist Meetings: Not on file  . Marital Status: Not on file     Family History: The patient's family history includes Heart Problems in her mother; Heart disease in her father  and mother.  ROS:   Please see the history of present illness.     All other systems reviewed and are negative.  EKGs/Labs/Other Studies Reviewed:    The following studies were reviewed today:   EKG:  EKG is  ordered today.  The ekg ordered today demonstrates sinus rhythm, frequent premature atrial complexes.  Recent Labs: 06/17/2019: ALT 13; BUN 17; Hemoglobin 13.0; Platelets 206.0; Potassium 3.3; Sodium 142; TSH 0.73 08/11/2019: Creatinine, Ser 0.60  Recent Lipid Panel    Component Value Date/Time   CHOL 173 06/17/2019 0826   TRIG 82.0 06/17/2019 0826   HDL 66.90 06/17/2019 0826   CHOLHDL 3 06/17/2019 0826   VLDL 16.4 06/17/2019 0826   LDLCALC 90 06/17/2019 0826    Physical Exam:    VS:  BP 140/70 (BP Location: Right Arm, Patient Position: Sitting, Cuff Size: Normal)   Pulse (!) 109   Ht 5\' 3"  (1.6 m)   Wt 167 lb 12 oz (76.1 kg)   SpO2 98%   BMI 29.72 kg/m     Wt Readings from Last 3 Encounters:  09/16/19 167 lb 12 oz (76.1 kg)  09/15/18 152 lb 3.2 oz (69 kg)  06/11/18 158 lb 1.9 oz (71.7 kg)     GEN:  Well nourished, well developed in no acute distress HEENT: Normal NECK: No JVD; No carotid bruits LYMPHATICS: No lymphadenopathy CARDIAC: Irregular irregular, no murmurs, rubs, gallops RESPIRATORY:  Clear to auscultation without rales, wheezing or rhonchi  ABDOMEN: Soft, non-tender, non-distended MUSCULOSKELETAL:  No edema; No deformity  SKIN: Warm and dry NEUROLOGIC:  Alert and oriented x 3 PSYCHIATRIC:  Normal affect   ASSESSMENT:    1. Irregular heart rhythm   2. Abnormal EKG    PLAN:    In order of problems listed above:  1. Patient with history of irregular heart rhythm noted during recent echocardiogram.  She denies palpitations.  EKG in the office today reveals sinus rhythm with frequent premature atrial complexes.  Due to frequency of PACs, there is still a chance of atrial fibrillation.  We will evaluate with a cardiac monitor x2 weeks for the  presence of atrial fibrillation.  If monitor reveals only PACs, patient will be reassured.  Follow-up after monitor.   Medication Adjustments/Labs and Tests Ordered: Current medicines are reviewed at length with the patient today.  Concerns regarding medicines are outlined above.  Orders Placed This Encounter  Procedures  . LONG TERM MONITOR (3-14 DAYS)  . EKG 12-Lead   No orders of the defined types were placed in this encounter.  Patient Instructions  Medication Instructions:  - Your physician recommends that you continue on your current medications as directed. Please refer to the Current Medication list given to you today.  *If you need a refill on your cardiac medications before your next appointment, please call your pharmacy*  Lab Work: - none ordered  If you have labs (blood work) drawn today and your tests are completely normal, you will receive your results only by: Marland Kitchen MyChart Message (if you have MyChart) OR . A paper copy in the mail If you have any lab test that is abnormal or we need to change your treatment, we will call you to review the results.  Testing/Procedures: - Your physician has recommended that you wear a 14 day Zio monitor (placed in office today). This monitor is a medical device that records the heart's electrical activity. Doctors most often use these monitors to diagnose arrhythmias. Arrhythmias are problems with the speed or rhythm of the heartbeat. The monitor is a small device applied to your chest. You can wear one while you do your normal daily activities. While wearing this monitor if you have any symptoms to push the button and record what you felt. Once you have worn this monitor for the period of time provider prescribed (Usually 14 days), you will return the monitor device in the postage paid box. Once it is returned they will download the data collected and provide [place] with a report which the provider will then review and we will call you with  those results. Important tips:  1. Avoid showering during the first 24 hours of wearing the monitor. 2. Avoid excessive sweating to help maximize wear time. 3. Do not submerge the device, no hot tubs, and no swimming pools. 4. Keep any lotions or oils away from the patch. 5. After 24 hours you may shower with the patch on. Take brief showers with your back facing the shower head.  6. Do not remove patch once it has been placed because that will interrupt data and decrease adhesive wear time. 7. Push the button when you have any symptoms and write down what you were feeling. 8. Once you have completed wearing your monitor, remove and place into box which has postage paid and place in your outgoing mailbox.  9. If for some reason you have misplaced your box then call our office and we can provide another box and/or mail it off for you.        Follow-Up: At [facility], you and your health needs are our priority.  As part of our continuing mission to provide you with exceptional heart care, we have created designated Provider Care Teams.  These Care Teams include your primary Cardiologist (physician) and Advanced Practice Providers (APPs -  Physician Assistants and Nurse Practitioners) who all work together to provide you with the care you need, when you need it.  Your next appointment:   4 week(s)  The format for your next appointment:   In Person  Provider:   Kate Sable, MD  Other Instructions n/a     Signed, Kate Sable, MD  09/16/2019 5:29 PM    [place]

## 2019-09-19 ENCOUNTER — Other Ambulatory Visit: Payer: Self-pay

## 2019-09-20 ENCOUNTER — Ambulatory Visit (INDEPENDENT_AMBULATORY_CARE_PROVIDER_SITE_OTHER): Payer: Medicare Other | Admitting: Internal Medicine

## 2019-09-20 ENCOUNTER — Telehealth: Payer: Self-pay | Admitting: Internal Medicine

## 2019-09-20 ENCOUNTER — Encounter: Payer: Self-pay | Admitting: Internal Medicine

## 2019-09-20 DIAGNOSIS — Z13220 Encounter for screening for lipoid disorders: Secondary | ICD-10-CM | POA: Diagnosis not present

## 2019-09-20 DIAGNOSIS — M25562 Pain in left knee: Secondary | ICD-10-CM

## 2019-09-20 DIAGNOSIS — R319 Hematuria, unspecified: Secondary | ICD-10-CM | POA: Diagnosis not present

## 2019-09-20 DIAGNOSIS — Z23 Encounter for immunization: Secondary | ICD-10-CM

## 2019-09-20 DIAGNOSIS — Z1231 Encounter for screening mammogram for malignant neoplasm of breast: Secondary | ICD-10-CM

## 2019-09-20 DIAGNOSIS — E785 Hyperlipidemia, unspecified: Secondary | ICD-10-CM | POA: Diagnosis not present

## 2019-09-20 DIAGNOSIS — I491 Atrial premature depolarization: Secondary | ICD-10-CM | POA: Diagnosis not present

## 2019-09-20 DIAGNOSIS — M25561 Pain in right knee: Secondary | ICD-10-CM

## 2019-09-20 HISTORY — DX: Atrial premature depolarization: I49.1

## 2019-09-20 MED ORDER — TETANUS-DIPHTH-ACELL PERTUSSIS 5-2.5-18.5 LF-MCG/0.5 IM SUSP: 0.5000 mL | Freq: Once | INTRAMUSCULAR | 0 refills | Status: AC

## 2019-09-20 NOTE — Progress Notes (Signed)
Virtual Visit via Video Note  I connected with Kelli Smith  on 09/20/19 at 10:50 AM EST by a video enabled telemedicine application and verified that I am speaking with the correct person using two identifiers.  Location patient: home Location provider:work or home office Persons participating in the virtual visit: patient, provider, pts daughter Kelli Smith  I discussed the limitations of evaluation and management by telemedicine and the availability of in person appointments. The patient expressed understanding and agreed to proceed.   HPI: 1. Wearing zio due to echo c/w a fib ? If in Afib and pt having a lot of pacs  Denies palpitations  Daughter c/w afib as her dad died stroke 2/2 Afib and wants to know  2. Dermatology due for f/u will call and schedule  3. Left knee pain improved at times has 1/10 intermittent left knee pain resolved x 2 weeks  4. Denies constipation  5. Reviewed MRI +renal cysts, left adrenal adenoma, GS w/o sx's    ROS: See pertinent positives and negatives per HPI. Denies sob, chest pain   Past Medical History:  Diagnosis Date  . Arthritis    knees, right shoulder   . History of chicken pox   . UTI (urinary tract infection)     Past Surgical History:  Procedure Laterality Date  . [place]  . EYE SURGERY     cataract 2013/2014 b/l   . FOOT SURGERY     bunion an dhammer toe in 2009    Family History  Problem Relation Age of Onset  . Heart disease Mother        died when pt was 64 y.o   . Heart Problems Mother   . Heart disease Father     SOCIAL HX:  From [place] lived in [place] since late 1990s early 2000  Lives with daughter  Secretary/administrator ed  Former Pharmacist, hospital  No guns, wears seat belt, safe in relationship  Widowed   2 daughters 1/2 in [employer]   Current Outpatient Medications:  .  Calcium-Magnesium-Vitamin D (CALCIUM 1200+D3 PO), daily. , Disp: , Rfl:  .  CHOLECALCIFEROL PO, Take 1,000 Units by mouth daily. , Disp: , Rfl:   .  diclofenac sodium (VOLTAREN) 1 % GEL, Apply 2-4 g topically 4 (four) times daily. 2 grams upper arms/shoulders and 4 grams knees, Disp: 100 g, Rfl: 11 .  Ferrous Sulfate (IRON) 325 (65 Fe) MG TABS, daily. , Disp: , Rfl:  .  NON FORMULARY, Hemp Oil 500mg  daily, Disp: , Rfl:  .  Omega-3 Fatty Acids (FISH OIL) 1000 MG CAPS, Take by mouth daily. , Disp: , Rfl:  .  Tdap (BOOSTRIX) 5-2.5-18.5 LF-MCG/0.5 injection, Inject 0.5 mLs into the muscle once for 1 dose., Disp: 0.5 mL, Rfl: 0 .  UNABLE TO FIND, Med Name: D6 100mg  daily, Disp: , Rfl:  .  vitamin B-12 (CYANOCOBALAMIN) 100 MCG tablet, Take 100 mcg by mouth daily., Disp: , Rfl:   EXAM:  VITALS per patient if applicable:  GENERAL: alert, oriented, appears well and in no acute distress  HEENT: atraumatic, conjunttiva clear, no obvious abnormalities on inspection of external nose and ears  NECK: normal movements of the head and neck  LUNGS: on inspection no signs of respiratory distress, breathing rate appears normal, no obvious gross SOB, gasping or wheezing  CV: no obvious cyanosis  MS: moves all visible extremities without noticeable abnormality  PSYCH/NEURO: pleasant and cooperative, no obvious depression or anxiety, speech and  thought processing grossly intact  ASSESSMENT AND PLAN:  Discussed the following assessment and plan:  Premature atrial contraction zio monitor to check for Afib  Pain in both knees, unspecified chronicity -left knee resolved if returns consider ortho referral   HM Flu shot had 04/22/18 and prevnar -will do flu shot high dose with labs 05/2019   Consider pna 23 in future if has not had in 1 year 03/2019  -per daughter had 05/2019 check ncir  Or pharmacy  Consider shingrix vaccine and Tdap  covid 1/2 vaccines 2nd dose due 10/03/19   Never smoker  Pap out of age window  cologuard neg 09/2018  Mammogram negative 08/2018 referred again  dexa 12/2018 osteopenia on calcium 1200 mg qd and vitamin D3 1000  to 2000 iu daily  Never had colonoscopy  Skin Dr. Kellie Moor h/o [facility] daughter to call for f/u   -we discussed possible serious and likely etiologies, options for evaluation and workup, limitations of telemedicine visit vs in person visit, treatment, treatment risks and precautions. Pt prefers to treat via telemedicine empirically rather then risking or undertaking an in person visit at this moment. Patient agrees to seek prompt in person care if worsening, new symptoms arise, or if is not improving with treatment.   I discussed the assessment and treatment plan with the patient. The patient was provided an opportunity to ask questions and all were answered. The patient agreed with the plan and demonstrated an understanding of the instructions.   The patient was advised to call back or seek an in-person evaluation if the symptoms worsen or if the condition fails to improve as anticipated.  Time spent 20 minutes  [name], MD

## 2019-09-20 NOTE — Telephone Encounter (Signed)
Log pna 23 vaccine    pna 23 -per daughter had 05/2019 check ncir  Or pharmacy   thx Amanda Park

## 2019-09-21 NOTE — Telephone Encounter (Signed)
Verified with pharmacy and updated immunizations

## 2019-09-26 ENCOUNTER — Ambulatory Visit: Payer: Medicare Other | Admitting: Podiatry

## 2019-09-29 ENCOUNTER — Encounter: Payer: Self-pay | Admitting: Podiatry

## 2019-09-29 ENCOUNTER — Ambulatory Visit (INDEPENDENT_AMBULATORY_CARE_PROVIDER_SITE_OTHER): Payer: Medicare Other | Admitting: Podiatry

## 2019-09-29 ENCOUNTER — Other Ambulatory Visit: Payer: Self-pay

## 2019-09-29 VITALS — Temp 94.5°F

## 2019-09-29 DIAGNOSIS — B351 Tinea unguium: Secondary | ICD-10-CM

## 2019-09-29 DIAGNOSIS — L84 Corns and callosities: Secondary | ICD-10-CM

## 2019-09-29 DIAGNOSIS — M79674 Pain in right toe(s): Secondary | ICD-10-CM

## 2019-09-29 DIAGNOSIS — M79675 Pain in left toe(s): Secondary | ICD-10-CM

## 2019-09-29 NOTE — Progress Notes (Signed)
Complaint:  Visit Type: Patient returns to my office for continued preventative foot care services. Complaint: Patient states" my nails have grown long and thick and become painful to walk and wear shoes" .  She presents to the office for  painful calluses . The patient presents for preventative foot care services. No changes to ROS  Podiatric Exam: Vascular: dorsalis pedis and posterior tibial pulses are palpable bilateral. Capillary return is immediate. Temperature gradient is WNL. Skin turgor WNL  Sensorium: Normal Semmes Weinstein monofilament test. Normal tactile sensation bilaterally. Nail Exam: Pt has thick disfigured discolored nails with subungual debris noted bilateral entire nail hallux through fifth toenails Ulcer Exam: There is no evidence of ulcer or pre-ulcerative changes or infection. Orthopedic Exam: Muscle tone and strength are WNL. No limitations in general ROM. No crepitus or effusions noted. Foot type and digits show no abnormalities. HAV  B/L.  Pe planus  B/l. Skin: No Porokeratosis. No infection or ulcers.  Pinch callus left hallux.  Callus sub 1st MPJ right foot.  Asymptomatic.   Diagnosis:  Onychomycosis, , Pain in right toe, pain in left toes  Callus  X 1  Treatment & Plan Procedures and Treatment: Consent by patient was obtained for treatment procedures.   Debridement of mycotic and hypertrophic toenails, 1 through 5 bilateral and clearing of subungual debris. No ulceration, no infection noted. Debride  Pinch callus left hallux.  Patient is very sensitive to treatment.  Limit amount of treatment.   .Return Visit-Office Procedure: Patient instructed to return to the office for a follow up visit 10 weeks  for continued evaluation and treatment.    Gardiner Barefoot DPM

## 2019-10-03 DIAGNOSIS — Z23 Encounter for immunization: Secondary | ICD-10-CM | POA: Diagnosis not present

## 2019-10-07 ENCOUNTER — Telehealth: Payer: Self-pay | Admitting: Internal Medicine

## 2019-10-07 NOTE — Telephone Encounter (Signed)
Patient had Pneumo Poly 23 documented in [facility] for 06/17/19.   Immunizations updated

## 2019-10-07 NOTE — Telephone Encounter (Signed)
-----   Message from [name], MD sent at 09/20/2019 11:30 AM EST ----- Check ncir for pna 23 or call pharmacy [facility]

## 2019-10-10 DIAGNOSIS — R002 Palpitations: Secondary | ICD-10-CM | POA: Diagnosis not present

## 2019-10-11 ENCOUNTER — Telehealth: Payer: Self-pay

## 2019-10-11 NOTE — Telephone Encounter (Signed)
Spoke with patients daughter regarding results and recommendation.  She verbalizes understanding and is agreeable to plan of care. Advised her to call back with any issues or concerns.

## 2019-10-11 NOTE — Telephone Encounter (Signed)
-----   Message from Kate Sable, MD sent at 10/11/2019 10:25 AM EDT ----- Atrial tachycardias and some supraventricular tachycardia noted.  There were no patient triggered events, hence could not correlate episodes of tachycardia with patient's symptoms.  Keep follow-up appointment.

## 2019-10-11 NOTE — Telephone Encounter (Signed)
Left message on patients voicemail to please return our call.   

## 2019-10-20 ENCOUNTER — Ambulatory Visit (INDEPENDENT_AMBULATORY_CARE_PROVIDER_SITE_OTHER): Payer: Medicare Other | Admitting: Cardiology

## 2019-10-20 ENCOUNTER — Other Ambulatory Visit: Payer: Self-pay

## 2019-10-20 ENCOUNTER — Encounter: Payer: Self-pay | Admitting: Cardiology

## 2019-10-20 VITALS — BP 150/80 | HR 88 | Ht 63.0 in | Wt 168.1 lb

## 2019-10-20 DIAGNOSIS — R030 Elevated blood-pressure reading, without diagnosis of hypertension: Secondary | ICD-10-CM | POA: Diagnosis not present

## 2019-10-20 DIAGNOSIS — I499 Cardiac arrhythmia, unspecified: Secondary | ICD-10-CM | POA: Diagnosis not present

## 2019-10-20 DIAGNOSIS — R0602 Shortness of breath: Secondary | ICD-10-CM | POA: Diagnosis not present

## 2019-10-20 NOTE — Patient Instructions (Signed)
Medication Instructions:  No changes  *If you need a refill on your cardiac medications before your next appointment, please call your pharmacy*   Lab Work: None  If you have labs (blood work) drawn today and your tests are completely normal, you will receive your results only by: Marland Kitchen MyChart Message (if you have MyChart) OR . A paper copy in the mail If you have any lab test that is abnormal or we need to change your treatment, we will call you to review the results.   Testing/Procedures: None   Follow-Up: At [facility], you and your health needs are our priority.  As part of our continuing mission to provide you with exceptional heart care, we have created designated Provider Care Teams.  These Care Teams include your primary Cardiologist (physician) and Advanced Practice Providers (APPs -  Physician Assistants and Nurse Practitioners) who all work together to provide you with the care you need, when you need it.  We recommend signing up for the patient portal called "MyChart".  Sign up information is provided on this After Visit Summary.  MyChart is used to connect with patients for Virtual Visits (Telemedicine).  Patients are able to view lab/test results, encounter notes, upcoming appointments, etc.  Non-urgent messages can be sent to your provider as well.   To learn more about what you can do with MyChart, go to [email or web address].    Your next appointment:   6 month(s)  The format for your next appointment:   In Person  Provider:    You may see Dr. Garen Lah or one of the following Advanced Practice Providers on your designated Care Team:    Murray Hodgkins, NP  Christell Faith, PA-C  Marrianne Mood, PA-C

## 2019-10-20 NOTE — Progress Notes (Signed)
Cardiology Office Note:    Date:  10/20/2019   ID:  Kelli Smith, DOB August 01, 1940, MRN [id number]  PCP:  McLean-Scocuzza, Nino Glow, MD  Cardiologist:  No primary care provider on file.  Electrophysiologist:  None   Referring MD: McLean-Scocuzza, Olivia Mackie *   Chief Complaint  Patient presents with  . office visit    4 week F/U after wearing ZIO monitor; Meds verbally reviewed with patient.    History of Present Illness:    Kelli Smith is a 79 y.o. female with a hx of arthritis who presents for follow-up.  She was last seen due to abnormal heart rhythm.  Patient has a history of kidney stones.  Underwent a CT scanning to evaluate for kidney stones presents.  A small pericardial effusion was noted which prompted patient to have an echocardiogram.  Echocardiogram on 08/2019 showed normal ejection fraction with EF 60 to 65%, mild LVH, indeterminate diastolic parameters.  During the echocardiogram, irregular heart was noted suggesting atrial fibrillation.  Patient denies chest pain, palpitations, previous cardiac history.  EKG showed frequent PACs.  Cardiac monitor was placed to evaluate for possible atrial fibrillation or flutter.  Patient states having some shortness of breath when she walks too fast.  She denies chest pain or dyspnea at rest.  She denies any prior history of smoking.  She has not done her usual walks in a while due to the pandemic.  Past Medical History:  Diagnosis Date  . Arthritis    knees, right shoulder   . History of chicken pox   . UTI (urinary tract infection)     Past Surgical History:  Procedure Laterality Date  . [place]  . EYE SURGERY     cataract 2013/2014 b/l   . FOOT SURGERY     bunion an dhammer toe in 2009    Current Medications: Current Meds  Medication Sig  . Calcium-Magnesium-Vitamin D (CALCIUM 1200+D3 PO) daily.   . CHOLECALCIFEROL PO Take 1,000 Units by mouth daily.   . diclofenac sodium (VOLTAREN) 1 % GEL Apply 2-4 g  topically 4 (four) times daily. 2 grams upper arms/Smith and 4 grams knees  . Ferrous Sulfate (IRON) 325 (65 Fe) MG TABS daily.   . NON FORMULARY Hemp Oil 500mg  daily  . Omega-3 Fatty Acids (FISH OIL) 1000 MG CAPS Take by mouth daily.   Marland Kitchen UNABLE TO FIND Med Name: D6 100mg  daily  . vitamin B-12 (CYANOCOBALAMIN) 100 MCG tablet Take 100 mcg by mouth daily.     Allergies:   Patient has no known allergies.   Social History   Socioeconomic History  . Marital status: Widowed    Spouse name: Not on file  . Number of children: Not on file  . Years of education: Not on file  . Highest education level: Not on file  Occupational History  . Not on file  Tobacco Use  . Smoking status: Never Smoker  . Smokeless tobacco: Never Used  Substance and Sexual Activity  . Alcohol use: Yes    Alcohol/week: 1.0 - 2.0 standard drinks    Types: 1 - 2 Glasses of wine per week    Comment: occasional drinker  . Drug use: Never  . Sexual activity: Not on file  Other Topics Concern  . Not on file  Social History Narrative   From [place] lived in [place] since late 1990s early 2000    Lives with daughter    College ed  Former Pharmacist, hospital    No guns, wears seat belt, safe in relationship    Widowed       2 daughters 1/2 in [employer]    Social Determinants of Health   Financial Resource Strain: Low Risk   . Difficulty of Paying Living Expenses: Not hard at all  Food Insecurity: No Food Insecurity  . Worried About Charity fundraiser in the Last Year: Never true  . Ran Out of Food in the Last Year: Never true  Transportation Needs: No Transportation Needs  . Lack of Transportation (Medical): No  . Lack of Transportation (Non-Medical): No  Physical Activity: Insufficiently Active  . Days of Exercise per Week: 1 day  . Minutes of Exercise per Session: 20 min  Stress: No Stress Concern Present  . Feeling of Stress : Not at all  Social Connections:   . Frequency of Communication with Friends and  Family:   . Frequency of Social Gatherings with Friends and Family:   . Attends Religious Services:   . Active Member of Clubs or Organizations:   . Attends Archivist Meetings:   Marland Kitchen Marital Status:      Family History: The patient's family history includes Heart Problems in her mother; Heart disease in her father and mother.  ROS:   Please see the history of present illness.     All other systems reviewed and are negative.  EKGs/Labs/Other Studies Reviewed:    The following studies were reviewed today:   EKG:  EKG is  ordered today.  The ekg ordered today demonstrates sinus rhythm, frequent premature atrial complexes.  Recent Labs: 06/17/2019: ALT 13; BUN 17; Hemoglobin 13.0; Platelets 206.0; Potassium 3.3; Sodium 142; TSH 0.73 08/11/2019: Creatinine, Ser 0.60  Recent Lipid Panel    Component Value Date/Time   CHOL 173 06/17/2019 0826   TRIG 82.0 06/17/2019 0826   HDL 66.90 06/17/2019 0826   CHOLHDL 3 06/17/2019 0826   VLDL 16.4 06/17/2019 0826   LDLCALC 90 06/17/2019 0826    Physical Exam:    VS:  BP (!) 150/80 (BP Location: Left Arm, Patient Position: Sitting, Cuff Size: Normal)   Pulse 88   Ht 5\' 3"  (1.6 m)   Wt 168 lb 2 oz (76.3 kg)   SpO2 98%   BMI 29.78 kg/m     Wt Readings from Last 3 Encounters:  10/20/19 168 lb 2 oz (76.3 kg)  09/16/19 167 lb 12 oz (76.1 kg)  09/15/18 152 lb 3.2 oz (69 kg)     GEN:  Well nourished, well developed in no acute distress HEENT: Normal NECK: No JVD; No carotid bruits LYMPHATICS: No lymphadenopathy CARDIAC: Irregular irregular, no murmurs, rubs, gallops RESPIRATORY:  Clear to auscultation without rales, wheezing or rhonchi  ABDOMEN: Soft, non-tender, non-distended MUSCULOSKELETAL:  No edema; No deformity  SKIN: Warm and dry NEUROLOGIC:  Alert and oriented x 3 PSYCHIATRIC:  Normal affect   ASSESSMENT:    1. Irregular heart rhythm   2. Elevated BP without diagnosis of hypertension   3. SOB (shortness of  breath)    PLAN:    In order of problems listed above:  1. Patient with history of irregular heart rhythm noted during echocardiogram.  She denies palpitations.  EKG in the office reveals sinus rhythm with frequent premature atrial complexes.  Cardiac monitor x2 weeks showed atrial tachycardias, frequent PACs and occasional SVT.  No incidents of atrial fibrillation or flutter noted.  Irregular rhythm likely due to frequent PACs.  Patient reassured. 2. Blood pressure elevated.  Patient does not have a history of hypertension.  States blood pressure is typically elevated when she comes to the office.  Encourage patient to keep a blood pressure log at home.  Consider antihypertensive if blood pressure stays elevated at home and at follow-up. 3. Nonspecific shortness of breath.  Likely due to deconditioning.  She does not have any risk factors for CAD.  Last echocardiogram showed normal ejection fraction, mild LVH with EF 60 to 65%.  if symptoms of dyspnea persists, will consider stress testing.  Follow-up in 6 months.   Medication Adjustments/Labs and Tests Ordered: Current medicines are reviewed at length with the patient today.  Concerns regarding medicines are outlined above.  Orders Placed This Encounter  Procedures  . EKG 12-Lead   No orders of the defined types were placed in this encounter.   Patient Instructions  Medication Instructions:  No changes  *If you need a refill on your cardiac medications before your next appointment, please call your pharmacy*   Lab Work: None  If you have labs (blood work) drawn today and your tests are completely normal, you will receive your results only by: Marland Kitchen MyChart Message (if you have MyChart) OR . A paper copy in the mail If you have any lab test that is abnormal or we need to change your treatment, we will call you to review the results.   Testing/Procedures: None   Follow-Up: At [facility], you and your health needs are our  priority.  As part of our continuing mission to provide you with exceptional heart care, we have created designated Provider Care Teams.  These Care Teams include your primary Cardiologist (physician) and Advanced Practice Providers (APPs -  Physician Assistants and Nurse Practitioners) who all work together to provide you with the care you need, when you need it.  We recommend signing up for the patient portal called "MyChart".  Sign up information is provided on this After Visit Summary.  MyChart is used to connect with patients for Virtual Visits (Telemedicine).  Patients are able to view lab/test results, encounter notes, upcoming appointments, etc.  Non-urgent messages can be sent to your provider as well.   To learn more about what you can do with MyChart, go to [email or web address].    Your next appointment:   6 month(s)  The format for your next appointment:   In Person  Provider:    You may see Dr. Garen Lah or one of the following Advanced Practice Providers on your designated Care Team:    Murray Hodgkins, NP  Christell Faith, PA-C  Marrianne Mood, PA-C         Signed, Kate Sable, MD  10/20/2019 8:55 AM    [place]

## 2019-10-25 ENCOUNTER — Encounter: Payer: Self-pay | Admitting: Internal Medicine

## 2019-12-06 DIAGNOSIS — Z08 Encounter for follow-up examination after completed treatment for malignant neoplasm: Secondary | ICD-10-CM | POA: Diagnosis not present

## 2019-12-06 DIAGNOSIS — D2261 Melanocytic nevi of right upper limb, including shoulder: Secondary | ICD-10-CM | POA: Diagnosis not present

## 2019-12-06 DIAGNOSIS — D2271 Melanocytic nevi of right lower limb, including hip: Secondary | ICD-10-CM | POA: Diagnosis not present

## 2019-12-06 DIAGNOSIS — Z85828 Personal history of other malignant neoplasm of skin: Secondary | ICD-10-CM | POA: Diagnosis not present

## 2019-12-06 DIAGNOSIS — D2262 Melanocytic nevi of left upper limb, including shoulder: Secondary | ICD-10-CM | POA: Diagnosis not present

## 2019-12-06 DIAGNOSIS — D225 Melanocytic nevi of trunk: Secondary | ICD-10-CM | POA: Diagnosis not present

## 2019-12-12 ENCOUNTER — Ambulatory Visit (INDEPENDENT_AMBULATORY_CARE_PROVIDER_SITE_OTHER): Payer: Medicare Other | Admitting: Podiatry

## 2019-12-12 ENCOUNTER — Other Ambulatory Visit: Payer: Self-pay

## 2019-12-12 ENCOUNTER — Encounter: Payer: Self-pay | Admitting: Podiatry

## 2019-12-12 VITALS — Temp 97.4°F

## 2019-12-12 DIAGNOSIS — B351 Tinea unguium: Secondary | ICD-10-CM | POA: Diagnosis not present

## 2019-12-12 DIAGNOSIS — M79674 Pain in right toe(s): Secondary | ICD-10-CM

## 2019-12-12 DIAGNOSIS — M79675 Pain in left toe(s): Secondary | ICD-10-CM | POA: Diagnosis not present

## 2019-12-12 DIAGNOSIS — L84 Corns and callosities: Secondary | ICD-10-CM

## 2019-12-12 NOTE — Progress Notes (Signed)
This patient returns to the office for evaluation and treatment of long thick painful nails .  This patient is unable to trim her own nails since the patient cannot reach her feet.  Patient says the nails are painful walking and wearing her shoes.  He returns for preventive foot care services.  General Appearance  Alert, conversant and in no acute stress.  Vascular  Dorsalis pedis and posterior tibial  pulses are palpable  bilaterally.  Capillary return is within normal limits  bilaterally. Temperature is within normal limits  bilaterally.  Neurologic  Senn-Weinstein monofilament wire test within normal limits  bilaterally. Muscle power within normal limits bilaterally.  Nails Thick disfigured discolored nails with subungual debris  from hallux to fifth toes bilaterally. No evidence of bacterial infection or drainage bilaterally.  Orthopedic  No limitations of motion  feet .  No crepitus or effusions noted.  No bony pathology or digital deformities noted.  Skin  normotropic skin with no porokeratosis noted bilaterally.  No signs of infections or ulcers noted.     Onychomycosis  Pain in toes right foot  Pain in toes left foot  Debridement  of nails  1-5  B/L with a nail nipper.  Nails were then filed using a dremel tool with no incidents.    RTC  10 weeks    Laruen Risser DPM  

## 2020-01-28 IMAGING — CT CT ABD-PELV W/O CM
2 of 4 series · 16 of 46 positions shown, 18 images · non-contrast
Comparison: None.

CLINICAL DATA: Gross hematuria episode 1 week ago

EXAM:
CT ABDOMEN AND PELVIS WITHOUT CONTRAST
TECHNIQUE: Multidetector CT imaging of the abdomen and pelvis was performed
following the standard protocol without IV contrast.

[Series 2: axials routine abdomen pelvis without 5.00 · axial · non-contrast · 0.81mm/px · z∈[-1455,-1085]mm · 13 of 82 slices shown, 15 images]
[im 4/82  soft-tissue]
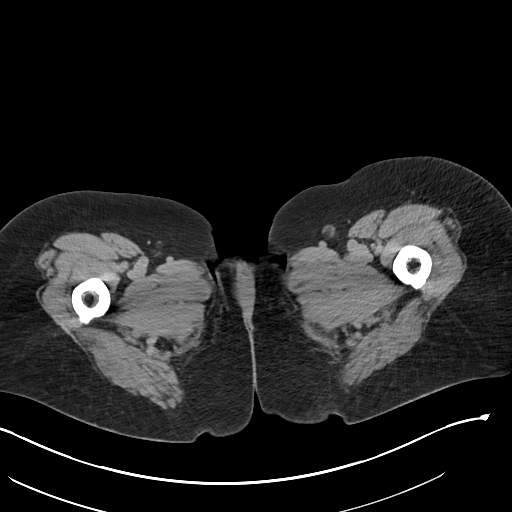
[im 4/82  bone]
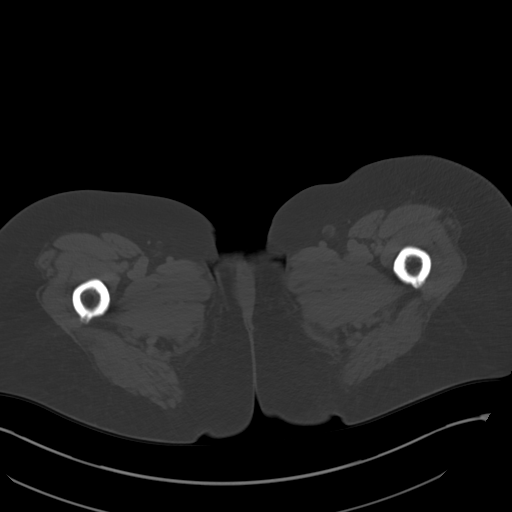
[im 11/82  soft-tissue]
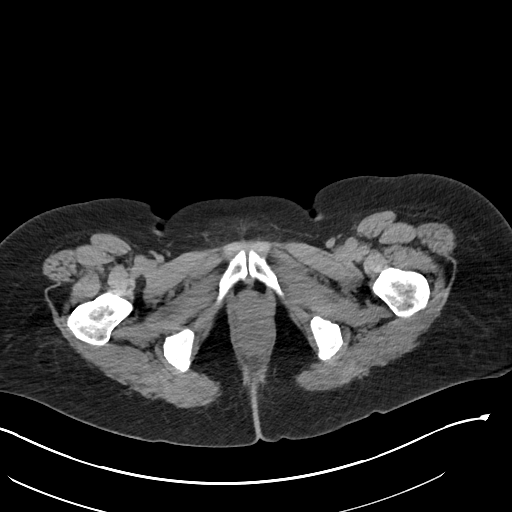
[im 18/82  soft-tissue]
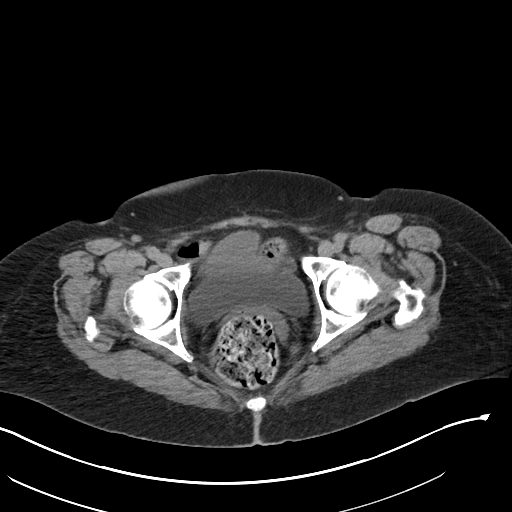
[im 22/82  soft-tissue]
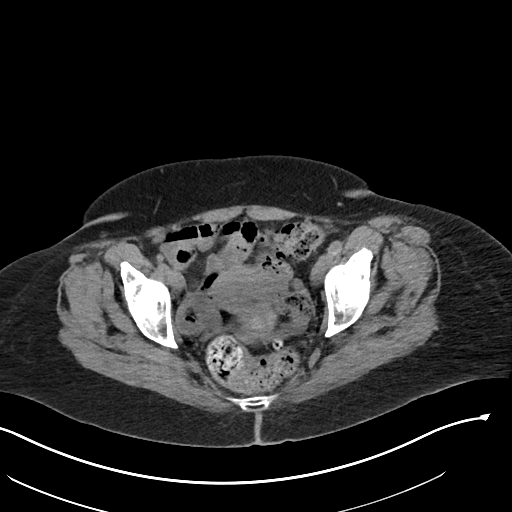
[im 29/82  soft-tissue]
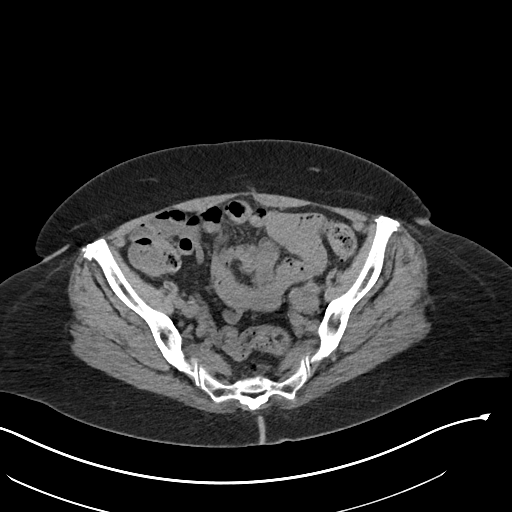
[im 36/82  soft-tissue]
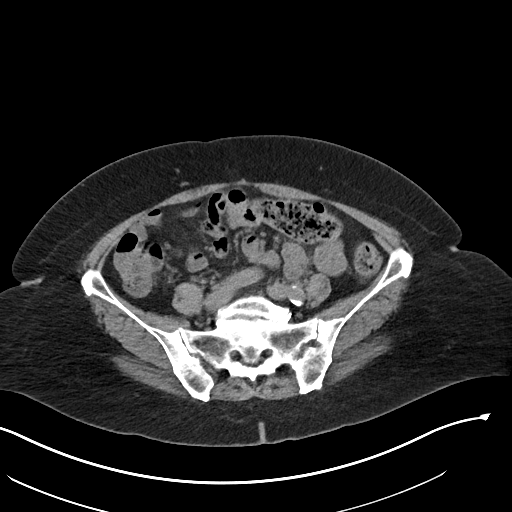
[im 43/82  soft-tissue]
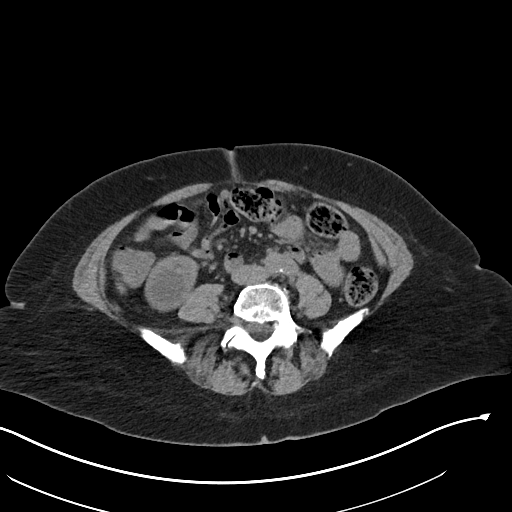
[im 46/82  soft-tissue]
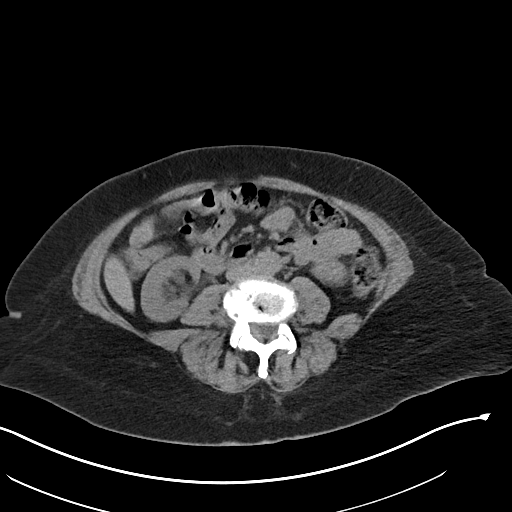
[im 53/82  soft-tissue]
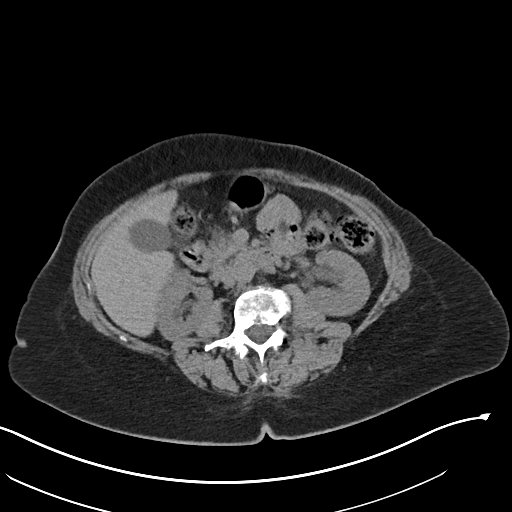
[im 53/82  bone]
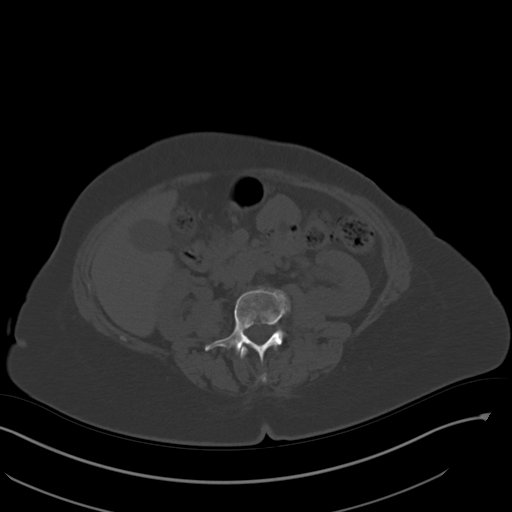
[im 60/82  soft-tissue]
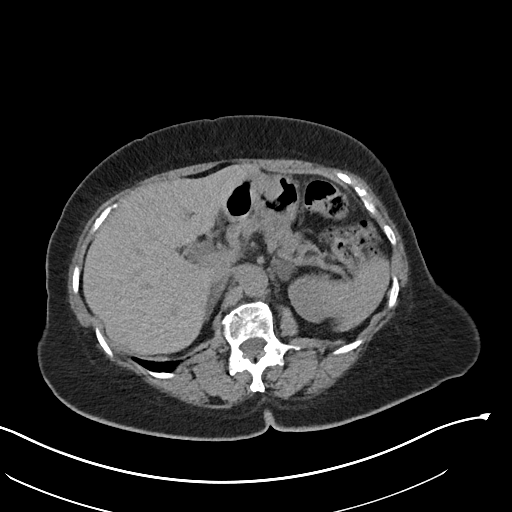
[im 64/82  soft-tissue]
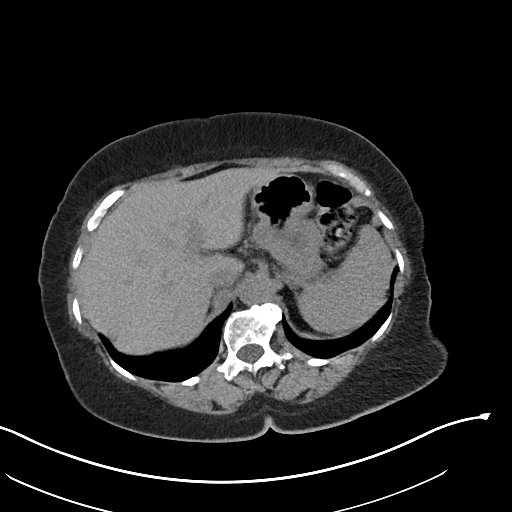
[im 71/82  soft-tissue]
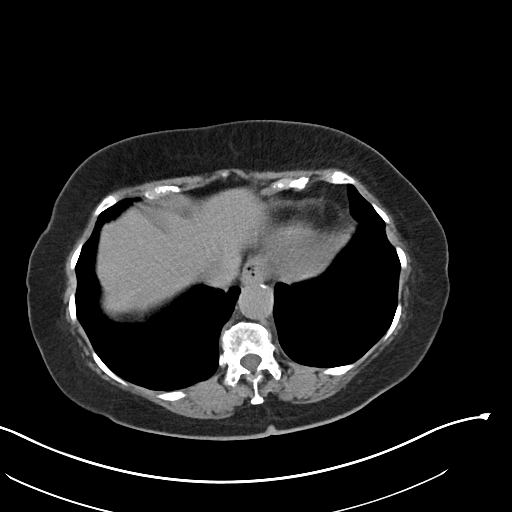
[im 78/82  soft-tissue]
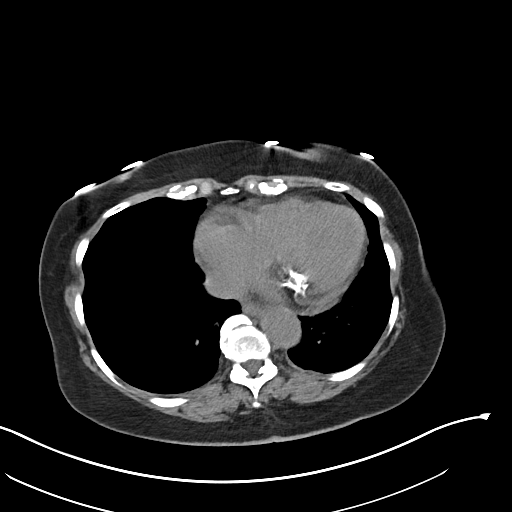

[Series 4: coronals routine abdomen pelvis without 2.00 cor · coronal · non-contrast · 0.80mm/px · 3 of 137 slices shown]
[im 46/137  soft-tissue]
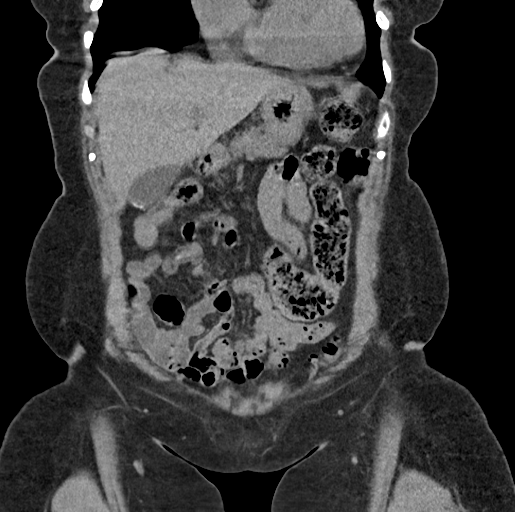
[im 61/137  soft-tissue]
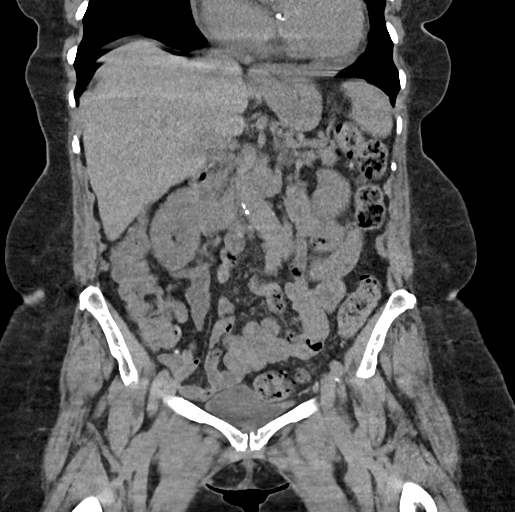
[im 76/137  soft-tissue]
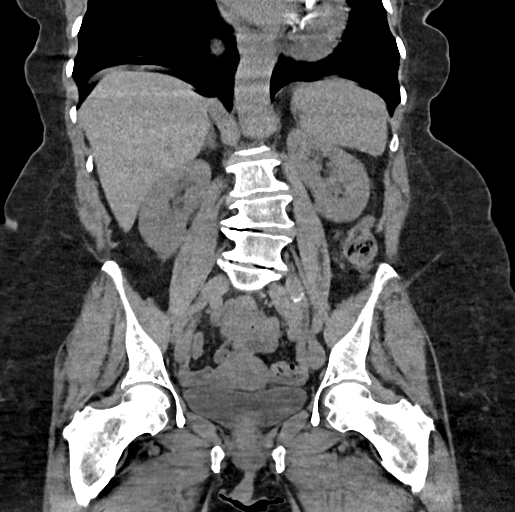

[16 of 46 positions shown; findings below may reference images not displayed]

FINDINGS: Lower chest: Mitral valve calcification. Mild cardiomegaly. Small
pericardial effusion inferiorly. Descending thoracic aortic
atherosclerotic vascular calcification.

Hepatobiliary: Accentuated density along the gallbladder fundus
potentially from gallstones although occasionally a Phrygian cap can
cause a similar appearance. Faint rim calcification along the
gallbladder medially could be due to a gallstone.

Pancreas: Unremarkable

Spleen: Unremarkable

Adrenals/Urinary Tract: Left adrenal adenoma 1.3 by 1.9 cm, internal
density 9 Hounsfield units.

3.0 cm in diameter hypodense right kidney lower pole lesion,
internal density 13 Hounsfield units. No urinary tract calculi are
identified. No hydronephrosis or hydroureter.

Stomach/Bowel: Prominent stool throughout the colon favors
constipation. Normal appendix.

Vascular/Lymphatic: Aortoiliac atherosclerotic vascular disease.

Reproductive: Unremarkable

Other: No supplemental non-categorized findings.

Musculoskeletal: Chronic bilateral pars defects at L5 with 8 mm of
anterolisthesis of L5 on S1 and resulting bilateral foraminal
impingement. Lumbar spondylosis and degenerative disc disease
resulting impingement at L2-3, L3-4, and L4-5. Mild levoconvex
lumbar scoliosis.
IMPRESSION: 1. No urinary tract calculi are identified.
2. 3.0 cm hypodense lesion of the right kidney lower pole is
probably a cyst but technically nonspecific. Enhancement
characteristics are unknown given that today's exam was a
non-contrast examination. This lesion could be further interrogated
with ultrasound or renal protocol MRI with and without contrast.
3. Prominent stool throughout the colon favors constipation.
4. Other imaging findings of potential clinical significance: Mitral
valve calcification. Mild cardiomegaly. Small pericardial effusion
inferiorly. Small left adrenal adenoma. Chronic bilateral pars
defects at L5 with 8 mm of anterolisthesis of L5 on S1 causing
bilateral foraminal impingement. Lumbar spondylosis and degenerative
disc disease causing impingement at L2-3, L3-4, and L4-5.

Aortic Atherosclerosis ([id number]).

## 2020-02-23 ENCOUNTER — Ambulatory Visit: Payer: Medicare Other | Admitting: Podiatry

## 2020-03-05 ENCOUNTER — Encounter: Payer: Self-pay | Admitting: Podiatry

## 2020-03-05 ENCOUNTER — Other Ambulatory Visit: Payer: Self-pay

## 2020-03-05 ENCOUNTER — Ambulatory Visit (INDEPENDENT_AMBULATORY_CARE_PROVIDER_SITE_OTHER): Payer: Medicare Other | Admitting: Podiatry

## 2020-03-05 DIAGNOSIS — M79674 Pain in right toe(s): Secondary | ICD-10-CM | POA: Diagnosis not present

## 2020-03-05 DIAGNOSIS — M79675 Pain in left toe(s): Secondary | ICD-10-CM

## 2020-03-05 DIAGNOSIS — B351 Tinea unguium: Secondary | ICD-10-CM | POA: Diagnosis not present

## 2020-03-05 NOTE — Progress Notes (Signed)
This patient returns to the office for evaluation and treatment of long thick painful nails .  This patient is unable to trim her own nails since the patient cannot reach her feet.  Patient says the nails are painful walking and wearing her shoes.  He returns for preventive foot care services.  General Appearance  Alert, conversant and in no acute stress.  Vascular  Dorsalis pedis and posterior tibial  pulses are palpable  bilaterally.  Capillary return is within normal limits  bilaterally. Temperature is within normal limits  bilaterally.  Neurologic  Senn-Weinstein monofilament wire test within normal limits  bilaterally. Muscle power within normal limits bilaterally.  Nails Thick disfigured discolored nails with subungual debris  from hallux to fifth toes bilaterally. No evidence of bacterial infection or drainage bilaterally.  Orthopedic  No limitations of motion  feet .  No crepitus or effusions noted.  No bony pathology or digital deformities noted.  Skin  normotropic skin with no porokeratosis noted bilaterally.  No signs of infections or ulcers noted.     Onychomycosis  Pain in toes right foot  Pain in toes left foot  Debridement  of nails  1-5  B/L with a nail nipper.  Nails were then filed using a dremel tool with no incidents.    RTC  10 weeks    Gardiner Barefoot DPM

## 2020-04-03 ENCOUNTER — Other Ambulatory Visit: Payer: Self-pay

## 2020-04-03 ENCOUNTER — Other Ambulatory Visit (INDEPENDENT_AMBULATORY_CARE_PROVIDER_SITE_OTHER): Payer: Medicare Other

## 2020-04-03 DIAGNOSIS — I491 Atrial premature depolarization: Secondary | ICD-10-CM

## 2020-04-03 DIAGNOSIS — E785 Hyperlipidemia, unspecified: Secondary | ICD-10-CM

## 2020-04-03 DIAGNOSIS — Z13220 Encounter for screening for lipoid disorders: Secondary | ICD-10-CM

## 2020-04-03 DIAGNOSIS — R319 Hematuria, unspecified: Secondary | ICD-10-CM | POA: Diagnosis not present

## 2020-04-03 LAB — COMPREHENSIVE METABOLIC PANEL
ALT: 12 U/L (ref 0–35)
AST: 17 U/L (ref 0–37)
Albumin: 3.9 g/dL (ref 3.5–5.2)
Alkaline Phosphatase: 77 U/L (ref 39–117)
BUN: 25 mg/dL — ABNORMAL HIGH (ref 6–23)
CO2: 28 mEq/L (ref 19–32)
Calcium: 9.5 mg/dL (ref 8.4–10.5)
Chloride: 104 mEq/L (ref 96–112)
Creatinine, Ser: 0.67 mg/dL (ref 0.40–1.20)
GFR: 84.73 mL/min (ref >60.00)
Glucose, Bld: 93 mg/dL (ref 70–99)
Potassium: 4.2 mEq/L (ref 3.5–5.1)
Sodium: 141 mEq/L (ref 135–145)
Total Bilirubin: 0.4 mg/dL (ref 0.2–1.2)
Total Protein: 6.7 g/dL (ref 6.0–8.3)

## 2020-04-03 LAB — LIPID PANEL
Cholesterol: 161 mg/dL (ref 0–200)
HDL: 68.8 mg/dL (ref >39.00)
LDL Cholesterol: 83 mg/dL (ref 0–99)
NonHDL: 92.04
Total CHOL/HDL Ratio: 2
Triglycerides: 47 mg/dL (ref 0.0–149.0)
VLDL: 9.4 mg/dL (ref 0.0–40.0)

## 2020-04-03 LAB — CBC WITH DIFFERENTIAL/PLATELET
Basophils Absolute: 0 10*3/uL (ref 0.0–0.1)
Basophils Relative: 0.3 % (ref 0.0–3.0)
Eosinophils Absolute: 0.3 10*3/uL (ref 0.0–0.7)
Eosinophils Relative: 3 % (ref 0.0–5.0)
HCT: 39.6 % (ref 36.0–46.0)
Hemoglobin: 12.7 g/dL (ref 12.0–15.0)
Lymphocytes Relative: 37 % (ref 12.0–46.0)
Lymphs Abs: 3.1 10*3/uL (ref 0.7–4.0)
MCHC: 32.2 g/dL (ref 30.0–36.0)
MCV: 73.4 fl — ABNORMAL LOW (ref 78.0–100.0)
Monocytes Absolute: 0.6 10*3/uL (ref 0.1–1.0)
Monocytes Relative: 7.7 % (ref 3.0–12.0)
Neutro Abs: 4.3 10*3/uL (ref 1.4–7.7)
Neutrophils Relative %: 52 % (ref 43.0–77.0)
Platelets: 209 10*3/uL (ref 150.0–400.0)
RBC: 5.39 Mil/uL — ABNORMAL HIGH (ref 3.87–5.11)
RDW: 14.6 % (ref 11.5–15.5)
WBC: 8.3 10*3/uL (ref 4.0–10.5)

## 2020-04-04 LAB — URINALYSIS, ROUTINE W REFLEX MICROSCOPIC
Bacteria, UA: NONE SEEN /HPF
Bilirubin Urine: NEGATIVE
Glucose, UA: NEGATIVE
Hyaline Cast: NONE SEEN /LPF
Ketones, ur: NEGATIVE
Leukocytes,Ua: NEGATIVE
Nitrite: NEGATIVE
Protein, ur: NEGATIVE
Specific Gravity, Urine: 1.016 (ref 1.001–1.03)
pH: 6.5 (ref 5.0–8.0)

## 2020-04-04 LAB — URINE CULTURE
MICRO NUMBER:: 10917164
Result:: NO GROWTH
SPECIMEN QUALITY:: ADEQUATE

## 2020-04-05 ENCOUNTER — Other Ambulatory Visit: Payer: Self-pay

## 2020-04-05 ENCOUNTER — Telehealth (INDEPENDENT_AMBULATORY_CARE_PROVIDER_SITE_OTHER): Payer: Medicare Other | Admitting: Internal Medicine

## 2020-04-05 ENCOUNTER — Encounter: Payer: Self-pay | Admitting: Internal Medicine

## 2020-04-05 VITALS — Ht 63.0 in | Wt 168.0 lb

## 2020-04-05 DIAGNOSIS — R319 Hematuria, unspecified: Secondary | ICD-10-CM | POA: Diagnosis not present

## 2020-04-05 DIAGNOSIS — M5136 Other intervertebral disc degeneration, lumbar region: Secondary | ICD-10-CM | POA: Diagnosis not present

## 2020-04-05 DIAGNOSIS — I491 Atrial premature depolarization: Secondary | ICD-10-CM | POA: Diagnosis not present

## 2020-04-05 DIAGNOSIS — M79671 Pain in right foot: Secondary | ICD-10-CM

## 2020-04-05 DIAGNOSIS — M5416 Radiculopathy, lumbar region: Secondary | ICD-10-CM

## 2020-04-05 DIAGNOSIS — Z23 Encounter for immunization: Secondary | ICD-10-CM | POA: Diagnosis not present

## 2020-04-05 DIAGNOSIS — R030 Elevated blood-pressure reading, without diagnosis of hypertension: Secondary | ICD-10-CM | POA: Diagnosis not present

## 2020-04-05 HISTORY — DX: Pain in right foot: M79.671

## 2020-04-05 HISTORY — DX: Hematuria, unspecified: R31.9

## 2020-04-05 MED ORDER — GABAPENTIN 300 MG PO CAPS: 300.0000 mg | ORAL_CAPSULE | Freq: Every day | ORAL | 3 refills | Status: DC

## 2020-04-05 MED ORDER — SHINGRIX 50 MCG/0.5ML IM SUSR: 0.5000 mL | Freq: Once | INTRAMUSCULAR | 0 refills | Status: AC

## 2020-04-05 MED ORDER — TETANUS-DIPHTH-ACELL PERTUSSIS 5-2.5-18.5 LF-MCG/0.5 IM SUSP: 0.5000 mL | Freq: Once | INTRAMUSCULAR | 0 refills | Status: AC

## 2020-04-05 NOTE — Progress Notes (Signed)
Virtual Visit via Video Note  I connected with Kelli Smith  on 04/05/20 at  8:15 AM EDT by a video enabled telemedicine application and verified that I am speaking with the correct person using two identifiers.  Location patient: home Location provider:work or home office Persons participating in the virtual visit: patient, provider, pts daughter   I discussed the limitations of evaluation and management by telemedicine and the availability of in person appointments. The patient expressed understanding and agreed to proceed.   HPI: 1. Elevated BP and PACs not checking BP currently will purchase BP cuff [employer]  2. Denies left knee pain  3. Reviewed labs 04/03/20 increase hydration BUN 25 , hematuria had CT and MRI ab pelvis no etiology declines urology for now  4. C/o right heel pain and numbness 5/10 denies trauma x 1 week sx's no redness/swelling reviewed CT ab/pelvis with impingement lumbar region this could be etiology also disc with podiatry 04/2020  Consider gabapentin and Xray pt wants to hold Xray for now    ROS: See pertinent positives and negatives per HPI.  Past Medical History:  Diagnosis Date  . Arthritis    knees, right shoulder   . History of chicken pox   . UTI (urinary tract infection)     Past Surgical History:  Procedure Laterality Date  . [place]  . EYE SURGERY     cataract 2013/2014 b/l   . FOOT SURGERY     bunion an dhammer toe in 2009    Family History  Problem Relation Age of Onset  . Heart disease Mother        died when pt was 67 y.o   . Heart Problems Mother   . Heart disease Father     SOCIAL HX: lives with daughter   Current Outpatient Medications:  .  Calcium-Magnesium-Vitamin D (CALCIUM 1200+D3 PO), daily. , Disp: , Rfl:  .  CHOLECALCIFEROL PO, Take 1,000 Units by mouth daily. , Disp: , Rfl:  .  Ferrous Sulfate (IRON) 325 (65 Fe) MG TABS, daily. , Disp: , Rfl:  .  NON FORMULARY, Hemp Oil 500mg  daily, Disp: , Rfl:   .  Omega-3 Fatty Acids (FISH OIL) 1000 MG CAPS, Take by mouth daily. , Disp: , Rfl:  .  UNABLE TO FIND, Med Name: D6 100mg  daily, Disp: , Rfl:  .  vitamin B-12 (CYANOCOBALAMIN) 100 MCG tablet, Take 100 mcg by mouth daily., Disp: , Rfl:  .  diclofenac sodium (VOLTAREN) 1 % GEL, Apply 2-4 g topically 4 (four) times daily. 2 grams upper arms/shoulders and 4 grams knees (Patient not taking: Reported on 04/05/2020), Disp: 100 g, Rfl: 11 .  gabapentin (NEURONTIN) 300 MG capsule, Take 1 capsule (300 mg total) by mouth at bedtime., Disp: 90 capsule, Rfl: 3 .  Tdap (BOOSTRIX) 5-2.5-18.5 LF-MCG/0.5 injection, Inject 0.5 mLs into the muscle once for 1 dose., Disp: 0.5 mL, Rfl: 0 .  Zoster Vaccine Adjuvanted (SHINGRIX) injection, Inject 0.5 mLs into the muscle once for 1 dose., Disp: 0.5 mL, Rfl: 0 .  Zoster Vaccine Adjuvanted (SHINGRIX) injection, Inject 0.5 mLs into the muscle once for 1 dose., Disp: 0.5 mL, Rfl: 0  EXAM:  VITALS per patient if applicable:  GENERAL: alert, oriented, appears well and in no acute distress  HEENT: atraumatic, conjunttiva clear, no obvious abnormalities on inspection of external nose and ears  NECK: normal movements of the head and neck  LUNGS: on inspection no signs of respiratory  distress, breathing rate appears normal, no obvious gross SOB, gasping or wheezing  CV: no obvious cyanosis  MS: moves all visible extremities without noticeable abnormality  PSYCH/NEURO: pleasant and cooperative, no obvious depression or anxiety, speech and thought processing grossly intact  ASSESSMENT AND PLAN:  Discussed the following assessment and plan:  Pain of right heel - Plan: gabapentin (NEURONTIN) 300 MG capsule, DG Foot Complete Right  Need for Tdap vaccination - Plan: Tdap ([place]) 5-2.5-18.5 LF-MCG/0.5 injection  Need for shingles vaccine - Plan: Zoster Vaccine Adjuvanted [facility]) injection, Zoster Vaccine Adjuvanted [facility]) injection  DDD (degenerative disc  disease), lumbar  Lumbar radiculopathy - Plan: gabapentin (NEURONTIN) 300 MG capsule  Elevated BP without diagnosis of hypertension PAC (premature atrial contraction) Monitor BP purchase home BP cuff  Consider toprol xl 25 mg qhs if elevated appt upcoming with cards 9/24   Hematuria, unspecified type Declines urology for now had CT/MRI ab/pelvis prev. No etiology   HM Flu shot had 04/22/18 and prevnar -will do flu shot high dose will get pharmacy  pna 23 utd  Consider shingrix vaccine and Tdap  covid 2/2 vaccines disc booster 3rd dose today  Never smoker Pap out of age window  cologuard neg 09/2018  Mammogram negative 08/2018 declines for now  dexa 12/2018 osteopenia on calcium 1200 mg qd and vitamin D3 1000 to 2000 iu daily Never had colonoscopy  Skin Dr. Kellie Moor h/o [place] seen 09/29/19 f/u in 3 or 10/2020  Consider hep C screen with next labs   -we discussed possible serious and likely etiologies, options for evaluation and workup, limitations of telemedicine visit vs in person visit, treatment, treatment risks and precautions. Pt prefers to treat via telemedicine empirically rather then risking or undertaking an in person visit at this moment.  Work/School slipped offered: Advised to seek prompt follow up telemedicine visit or in person care if worsening, new symptoms arise, or if is not improving with treatment. Did let her know that I only do telemedicine on Tuesdays and Thursdays for [place] and advised follow up visit with PCP or [facility] if needs follow up or if any further questions arise to avoid any delays.   I discussed the assessment and treatment plan with the patient. The patient was provided an opportunity to ask questions and all were answered. The patient agreed with the plan and demonstrated an understanding of the instructions.   The patient was advised to call back or seek an in-person evaluation if the symptoms worsen or if the condition fails to improve as  anticipated.  Time 30 minutes [name], MD

## 2020-04-20 ENCOUNTER — Other Ambulatory Visit: Payer: Self-pay

## 2020-04-20 ENCOUNTER — Encounter: Payer: Self-pay | Admitting: Cardiology

## 2020-04-20 ENCOUNTER — Ambulatory Visit (INDEPENDENT_AMBULATORY_CARE_PROVIDER_SITE_OTHER): Payer: Medicare Other | Admitting: Cardiology

## 2020-04-20 VITALS — BP 130/80 | HR 90 | Ht 63.0 in | Wt 162.1 lb

## 2020-04-20 DIAGNOSIS — R030 Elevated blood-pressure reading, without diagnosis of hypertension: Secondary | ICD-10-CM

## 2020-04-20 DIAGNOSIS — R0602 Shortness of breath: Secondary | ICD-10-CM

## 2020-04-20 NOTE — Patient Instructions (Addendum)
Medication Instructions:   1. Your physician recommends that you continue on your current medications as directed. Please refer to the Current Medication list given to you today.  *If you need a refill on your cardiac medications before your next appointment, please call your pharmacy*   Lab Work:  1. None Ordered  If you have labs (blood work) drawn today and your tests are completely normal, you will receive your results only by: . MyChart Message (if you have MyChart) OR . A paper copy in the mail If you have any lab test that is abnormal or we need to change your treatment, we will call you to review the results.   Testing/Procedures:  1. None Ordered   Follow-Up: At [facility], you and your health needs are our priority.  As part of our continuing mission to provide you with exceptional heart care, we have created designated Provider Care Teams.  These Care Teams include your primary Cardiologist (physician) and Advanced Practice Providers (APPs -  Physician Assistants and Nurse Practitioners) who all work together to provide you with the care you need, when you need it.  We recommend signing up for the patient portal called "MyChart".  Sign up information is provided on this After Visit Summary.  MyChart is used to connect with patients for Virtual Visits (Telemedicine).  Patients are able to view lab/test results, encounter notes, upcoming appointments, etc.  Non-urgent messages can be sent to your provider as well.   To learn more about what you can do with MyChart, go to [email or web address].    Your next appointment:   As needed  

## 2020-04-20 NOTE — Progress Notes (Signed)
Cardiology Office Note:    Date:  04/20/2020   ID:  Kelli Smith, DOB [birth year], MRN [id number]  PCP:  McLean-Scocuzza, Nino Glow, MD  Cardiologist:  No primary care provider on file.  Electrophysiologist:  None   Referring MD: McLean-Scocuzza, Olivia Mackie *   Chief Complaint  Patient presents with  . other    6 month follow up. Meds reviewed by the pt. verbally. "doing well."     History of Present Illness:    Kelli Smith is a 79 y.o. female with a hx of arthritis who presents for follow-up.  Previously seen for abnormal heart rhythm, cardiac monitor showed frequent PACs.  Also had some shortness of breath which was attributed to deconditioning since she was not performing her usual walks/exercise due to the pandemic.  She was advised if symptoms persist, stress test can be considered.  Previous echocardiogram on 08/2018 showed normal ejection fraction, EF 60-65, mild LVH.  She states her symptoms of shortness of breath have improved.  She presents today with her daughter.  Checks her blood pressure frequently at home, systolic usually in the [id number].  Past Medical History:  Diagnosis Date  . Arthritis    knees, right shoulder   . History of chicken pox   . UTI (urinary tract infection)     Past Surgical History:  Procedure Laterality Date  . [place]  . EYE SURGERY     cataract 2013/2014 b/l   . FOOT SURGERY     bunion an dhammer toe in 2009    Current Medications: Current Meds  Medication Sig  . Calcium-Magnesium-Vitamin D (CALCIUM 1200+D3 PO) daily.   . CHOLECALCIFEROL PO Take 1,000 Units by mouth daily.   . diclofenac sodium (VOLTAREN) 1 % GEL Apply 2-4 g topically 4 (four) times daily. 2 grams upper arms/shoulders and 4 grams knees  . Ferrous Sulfate (IRON) 325 (65 Fe) MG TABS daily.   Marland Kitchen gabapentin (NEURONTIN) 300 MG capsule Take 1 capsule (300 mg total) by mouth at bedtime.  . NON FORMULARY Hemp Oil 500mg  daily  . Omega-3 Fatty Acids (FISH OIL)  1000 MG CAPS Take by mouth daily.   Marland Kitchen UNABLE TO FIND Med Name: D6 100mg  daily  . vitamin B-12 (CYANOCOBALAMIN) 100 MCG tablet Take 100 mcg by mouth daily.     Allergies:   Patient has no known allergies.   Social History   Socioeconomic History  . Marital status: Widowed    Spouse name: Not on file  . Number of children: Not on file  . Years of education: Not on file  . Highest education level: Not on file  Occupational History  . Not on file  Tobacco Use  . Smoking status: Never Smoker  . Smokeless tobacco: Never Used  Vaping Use  . Vaping Use: Never used  Substance and Sexual Activity  . Alcohol use: Yes    Alcohol/week: 1.0 - 2.0 standard drink    Types: 1 - 2 Glasses of wine per week    Comment: occasional drinker  . Drug use: Never  . Sexual activity: Not on file  Other Topics Concern  . Not on file  Social History Narrative   From [place] lived in [place] since late 1990s early 2000    Lives with daughter    College ed    Former Pharmacist, hospital    No guns, wears seat belt, safe in relationship    Widowed       2 daughters  1/2 in [employer]    Social Determinants of Health   Financial Resource Strain: Low Risk   . Difficulty of Paying Living Expenses: Not hard at all  Food Insecurity: No Food Insecurity  . Worried About Charity fundraiser in the Last Year: Never true  . Ran Out of Food in the Last Year: Never true  Transportation Needs: No Transportation Needs  . Lack of Transportation (Medical): No  . Lack of Transportation (Non-Medical): No  Physical Activity: Insufficiently Active  . Days of Exercise per Week: 1 day  . Minutes of Exercise per Session: 20 min  Stress: No Stress Concern Present  . Feeling of Stress : Not at all  Social Connections:   . Frequency of Communication with Friends and Family: Not on file  . Frequency of Social Gatherings with Friends and Family: Not on file  . Attends Religious Services: Not on file  . Active Member of Clubs or  Organizations: Not on file  . Attends Archivist Meetings: Not on file  . Marital Status: Not on file     Family History: The patient's family history includes Heart Problems in her mother; Heart disease in her father and mother.  ROS:   Please see the history of present illness.     All other systems reviewed and are negative.  EKGs/Labs/Other Studies Reviewed:    The following studies were reviewed today:   EKG:  EKG is  ordered today.  The ekg ordered today demonstrates sinus rhythm, frequent premature atrial complexes.  Recent Labs: 06/17/2019: TSH 0.73 04/03/2020: ALT 12; BUN 25; Creatinine, Ser 0.67; Hemoglobin 12.7; Platelets 209.0; Potassium 4.2; Sodium 141  Recent Lipid Panel    Component Value Date/Time   CHOL 161 04/03/2020 0809   TRIG 47.0 04/03/2020 0809   HDL 68.80 04/03/2020 0809   CHOLHDL 2 04/03/2020 0809   VLDL 9.4 04/03/2020 0809   LDLCALC 83 04/03/2020 0809    Physical Exam:    VS:  BP 130/80 (BP Location: Left Arm, Patient Position: Sitting, Cuff Size: Normal)   Pulse 90   Ht 5\' 3"  (1.6 m)   Wt 162 lb 2 oz (73.5 kg)   BMI 28.72 kg/m     Wt Readings from Last 3 Encounters:  04/20/20 162 lb 2 oz (73.5 kg)  04/05/20 168 lb (76.2 kg)  10/20/19 168 lb 2 oz (76.3 kg)     GEN:  Well nourished, well developed in no acute distress HEENT: Normal NECK: No JVD; No carotid bruits LYMPHATICS: No lymphadenopathy CARDIAC: Irregular irregular, no murmurs, rubs, gallops RESPIRATORY:  Clear to auscultation without rales, wheezing or rhonchi  ABDOMEN: Soft, non-tender, non-distended MUSCULOSKELETAL:  No edema; No deformity  SKIN: Warm and dry NEUROLOGIC:  Alert and oriented x 3 PSYCHIATRIC:  Normal affect   ASSESSMENT:    1. Elevated BP without diagnosis of hypertension   2. SOB (shortness of breath)    PLAN:    In order of problems listed above:  1. Previous blood pressure was elevated.  Much improved today.  BP usually normal at home.   Continue to monitor off antihypertensives.  Low-salt diet advised.  Exercise advised. 2. History of nonspecific shortness of breath, symptoms are currently resolved.  Last echocardiogram showed normal ejection fraction, mild LVH with EF 60 to 65%.   Follow-up as needed  Total encounter time 35 minutes  Greater than 50% was spent in counseling and coordination of care with the patient   Medication Adjustments/Labs and  Tests Ordered: Current medicines are reviewed at length with the patient today.  Concerns regarding medicines are outlined above.  Orders Placed This Encounter  Procedures  . EKG 12-Lead   No orders of the defined types were placed in this encounter.   Patient Instructions  Medication Instructions:   1. Your physician recommends that you continue on your current medications as directed. Please refer to the Current Medication list given to you today.  *If you need a refill on your cardiac medications before your next appointment, please call your pharmacy*   Lab Work:  1. None Ordered  If you have labs (blood work) drawn today and your tests are completely normal, you will receive your results only by: Marland Kitchen MyChart Message (if you have MyChart) OR . A paper copy in the mail If you have any lab test that is abnormal or we need to change your treatment, we will call you to review the results.   Testing/Procedures:  1. None Ordered   Follow-Up: At [facility], you and your health needs are our priority.  As part of our continuing mission to provide you with exceptional heart care, we have created designated Provider Care Teams.  These Care Teams include your primary Cardiologist (physician) and Advanced Practice Providers (APPs -  Physician Assistants and Nurse Practitioners) who all work together to provide you with the care you need, when you need it.  We recommend signing up for the patient portal called "MyChart".  Sign up information is provided on this After  Visit Summary.  MyChart is used to connect with patients for Virtual Visits (Telemedicine).  Patients are able to view lab/test results, encounter notes, upcoming appointments, etc.  Non-urgent messages can be sent to your provider as well.   To learn more about what you can do with MyChart, go to [email or web address].    Your next appointment:    As needed            Signed, Kate Sable, MD  04/20/2020 12:34 PM    [place]

## 2020-04-21 DIAGNOSIS — Z23 Encounter for immunization: Secondary | ICD-10-CM | POA: Diagnosis not present

## 2020-05-11 DIAGNOSIS — Z961 Presence of intraocular lens: Secondary | ICD-10-CM | POA: Diagnosis not present

## 2020-05-11 DIAGNOSIS — H43393 Other vitreous opacities, bilateral: Secondary | ICD-10-CM | POA: Diagnosis not present

## 2020-05-11 DIAGNOSIS — H524 Presbyopia: Secondary | ICD-10-CM | POA: Diagnosis not present

## 2020-05-11 DIAGNOSIS — H35363 Drusen (degenerative) of macula, bilateral: Secondary | ICD-10-CM | POA: Diagnosis not present

## 2020-05-14 ENCOUNTER — Encounter: Payer: Self-pay | Admitting: Podiatry

## 2020-05-14 ENCOUNTER — Ambulatory Visit (INDEPENDENT_AMBULATORY_CARE_PROVIDER_SITE_OTHER): Payer: Medicare Other | Admitting: Podiatry

## 2020-05-14 ENCOUNTER — Other Ambulatory Visit: Payer: Self-pay

## 2020-05-14 DIAGNOSIS — B351 Tinea unguium: Secondary | ICD-10-CM | POA: Diagnosis not present

## 2020-05-14 DIAGNOSIS — M79674 Pain in right toe(s): Secondary | ICD-10-CM | POA: Diagnosis not present

## 2020-05-14 DIAGNOSIS — M79675 Pain in left toe(s): Secondary | ICD-10-CM

## 2020-05-14 NOTE — Progress Notes (Signed)
This patient returns to the office for evaluation and treatment of long thick painful nails .  This patient is unable to trim her own nails since the patient cannot reach her feet.  Patient says the nails are painful walking and wearing her shoes.  She returns for preventive foot care services.  General Appearance  Alert, conversant and in no acute stress.  Vascular  Dorsalis pedis and posterior tibial  pulses are palpable  bilaterally.  Capillary return is within normal limits  bilaterally. Temperature is within normal limits  bilaterally.  Neurologic  Senn-Weinstein monofilament wire test within normal limits  bilaterally. Muscle power within normal limits bilaterally.  Nails Thick disfigured discolored nails with subungual debris  from hallux to fifth toes bilaterally. No evidence of bacterial infection or drainage bilaterally.  Orthopedic  No limitations of motion  feet .  No crepitus or effusions noted.  No bony pathology or digital deformities noted.  Skin  normotropic skin with no porokeratosis noted bilaterally.  No signs of infections or ulcers noted.   Asymptomatic clavi 3rd left  And hallux left.  Onychomycosis  Pain in toes right foot  Pain in toes left foot  Debridement  of nails  1-5  B/L with a nail nipper.  Nails were then filed using a dremel tool with no incidents.    RTC  10 weeks    Gardiner Barefoot DPM

## 2020-05-28 ENCOUNTER — Ambulatory Visit (INDEPENDENT_AMBULATORY_CARE_PROVIDER_SITE_OTHER): Payer: Medicare Other

## 2020-05-28 VITALS — BP 133/84 | Ht 63.0 in | Wt 162.0 lb

## 2020-05-28 DIAGNOSIS — Z0000 Encounter for general adult medical examination without abnormal findings: Secondary | ICD-10-CM | POA: Diagnosis not present

## 2020-05-28 NOTE — Progress Notes (Addendum)
Subjective:   Kelli Smith is a 79 y.o. female who presents for Medicare Annual (Subsequent) preventive examination.  Review of Systems    No ROS.  Medicare Wellness Virtual Visit.   Cardiac Risk Factors include: advanced age (>48men, >72 women)     Objective:    Today's Vitals   05/28/20 1506  BP: 133/84  Weight: 162 lb (73.5 kg)  Height: 5\' 3"  (1.6 m)   Body mass index is 28.7 kg/m.  Advanced Directives 05/28/2020 05/26/2019  Does Patient Have a Medical Advance Directive? No No  Does patient want to make changes to medical advance directive? - No - Patient declined  Would patient like information on creating a medical advance directive? No - Patient declined -    Current Medications (verified) Outpatient Encounter Medications as of 05/28/2020  Medication Sig   Apoaequorin (PREVAGEN PO) Take 1 tablet by mouth.   Calcium-Magnesium-Vitamin D (CALCIUM 1200+D3 PO) daily.    CHOLECALCIFEROL PO Take 1,000 Units by mouth daily.    diclofenac sodium (VOLTAREN) 1 % GEL Apply 2-4 g topically 4 (four) times daily. 2 grams upper arms/shoulders and 4 grams knees   Ferrous Sulfate (IRON) 325 (65 Fe) MG TABS daily.    NON FORMULARY Hemp Oil 500mg  daily   Omega-3 Fatty Acids (FISH OIL) 1000 MG CAPS Take by mouth daily.    UNABLE TO FIND Med Name: D6 100mg  daily   vitamin B-12 (CYANOCOBALAMIN) 100 MCG tablet Take 100 mcg by mouth daily.   gabapentin (NEURONTIN) 300 MG capsule Take 1 capsule (300 mg total) by mouth at bedtime. (Patient not taking: Reported on 05/28/2020)   No facility-administered encounter medications on file as of 05/28/2020.    Allergies (verified) Patient has no known allergies.   History: Past Medical History:  Diagnosis Date   Arthritis    knees, right shoulder    History of chicken pox    UTI (urinary tract infection)    Past Surgical History:  Procedure Laterality Date   CESAREAN SECTION     1978   EYE SURGERY     cataract 2013/2014 b/l    FOOT  SURGERY     bunion an dhammer toe in 2009   Family History  Problem Relation Age of Onset   Heart disease Mother        died when pt was 47 y.o    Heart Problems Mother    Heart disease Father    Social History   Socioeconomic History   Marital status: Widowed    Spouse name: Not on file   Number of children: Not on file   Years of education: Not on file   Highest education level: Not on file  Occupational History   Not on file  Tobacco Use   Smoking status: Never Smoker   Smokeless tobacco: Never Used  Vaping Use   Vaping Use: Never used  Substance and Sexual Activity   Alcohol use: Yes    Alcohol/week: 1.0 - 2.0 standard drink    Types: 1 - 2 Glasses of wine per week    Comment: occasional drinker   Drug use: Never   Sexual activity: Not on file  Other Topics Concern   Not on file  Social History Narrative   From [place] lived in [place] since late 1990s early 2000    Lives with daughter    Secretary/administrator ed    Former Pharmacist, hospital    No guns, wears seat belt, safe in relationship  Widowed       2 daughters 1/2 in [place] Determinants of Health   Financial Resource Strain: Low Risk    Difficulty of Paying Living Expenses: Not hard at all  Food Insecurity: No Food Insecurity   Worried About Charity fundraiser in the Last Year: Never true   Arboriculturist in the Last Year: Never true  Transportation Needs: No Transportation Needs   Lack of Transportation (Medical): No   Lack of Transportation (Non-Medical): No  Physical Activity:    Days of Exercise per Week: Not on file   Minutes of Exercise per Session: Not on file  Stress: No Stress Concern Present   Feeling of Stress : Not at all  Social Connections: Unknown   Frequency of Communication with Friends and Family: More than three times a week   Frequency of Social Gatherings with Friends and Family: Not on file   Attends Religious Services: Not on Electrical engineer or Organizations: Not on  file   Attends Archivist Meetings: Not on file   Marital Status: Not on file    Tobacco Counseling Counseling given: Not Answered   Clinical Intake:  Pre-visit preparation completed: Yes        Diabetes: No  How often do you need to have someone help you when you read instructions, pamphlets, or other written materials from your doctor or pharmacy?: 1 - Never   Interpreter Needed?: No      Activities of Daily Living In your present state of health, do you have any difficulty performing the following activities: 05/28/2020  Hearing? N  Vision? N  Difficulty concentrating or making decisions? N  Walking or climbing stairs? N  Dressing or bathing? N  Doing errands, shopping? N  Comment Patient does drive alone but not often. Generally with daughter.  Preparing Food and eating ? N  Using the Toilet? N  In the past six months, have you accidently leaked urine? N  Do you have problems with loss of bowel control? N  Managing your Medications? Y  Comment Daughter assists as needed  Managing your Finances? Y  Comment Daughter assist as needed  Housekeeping or managing your Housekeeping? Y  Comment Daughter assists as needed  Some recent data might be hidden    Patient Care Team: McLean-Scocuzza, Nino Glow, MD as PCP - General (Internal Medicine)  Indicate any recent Medical Services you may have received from other than [facility] providers in the past year (date may be approximate).     Assessment:   This is a routine wellness examination for [facility].  I connected with Avri today by telephone and verified that I am speaking with the correct person using two identifiers. Location patient: home Location provider: work Persons participating in the virtual visit: patient, daughter, Marine scientist.    I discussed the limitations, risks, security and privacy concerns of performing an evaluation and management service by telephone and the availability of in person  appointments. The patient expressed understanding and verbally consented to this telephonic visit.    Interactive audio and video telecommunications were attempted between this provider and patient, however failed, due to patient having technical difficulties OR patient did not have access to video capability.  We continued and completed visit with audio only.  Some vital signs may be absent or patient reported.   Hearing/Vision screen  Hearing Screening   125Hz  250Hz  500Hz  1000Hz  2000Hz  3000Hz  4000Hz  6000Hz  8000Hz   Right ear:           Left ear:           Comments: Patient is able to hear conversational tones without difficulty. No issues reported.  Vision Screening Comments: Visual acuity not assessed, virtual visit.  Followed by [facility], Dr. Kerin Ransom Wears glasses  Dietary issues and exercise activities discussed: Current Exercise Habits: Home exercise routine, Type of exercise: walking, Intensity: Mild  Healthy diet Good water intake  Goals      Follow up with Primary Care Provider     As needed Stay hydrated       Depression Screen PHQ 2/9 Scores 05/28/2020 04/05/2020 05/26/2019 09/15/2018  PHQ - 2 Score 0 0 0 0    Fall Risk Fall Risk  05/28/2020 04/05/2020 05/26/2019 09/15/2018  Falls in the past year? 0 0 0 0  Number falls in past yr: 0 0 - -  Injury with Fall? - 0 - -  Follow up Falls evaluation completed Falls evaluation completed - -   Handrails in use when climbing stairs? Yes Home free of loose throw rugs in walkways, pet beds, electrical cords, etc? Yes  Adequate lighting in your home to reduce risk of falls? Yes   ASSISTIVE DEVICES UTILIZED TO PREVENT FALLS: Use of a cane, walker or w/c? No   TIMED UP AND GO: Was the test performed? No . Virtual visit.   Cognitive Function: Patient is alert and oriented x3.  Enjoys puzzle books for brain stimulation.   Taking prevagen daily.    6CIT Screen 05/28/2020  What Year? 0 points  What month? 0 points    Months in reverse 0 points  Repeat phrase 10 points   Immunizations Immunization History  Administered Date(s) Administered   Fluad Quad(high Dose 65+) 05/26/2019   Influenza, High Dose Seasonal PF 04/22/2018, 04/21/2020   PFIZER SARS-COV-2 Vaccination 09/12/2019, 10/03/2019   Pneumococcal Conjugate-13 04/22/2018   Pneumococcal Polysaccharide-23 06/17/2019   TDAP status: Due, Education has been provided regarding the importance of this vaccine. Advised may receive this vaccine at local pharmacy or Health Dept. Aware to provide a copy of the vaccination record if obtained from local pharmacy or Health Dept. Verbalized acceptance and understanding.  Deferred.   Health Maintenance Health Maintenance  Topic Date Due   TETANUS/TDAP  08/07/2020 (Originally 06/18/1960)   Hepatitis C Screening  08/07/2020 (Originally 1940-09-20)   INFLUENZA VACCINE  Completed   DEXA SCAN  Completed   COVID-19 Vaccine  Completed   PNA vac Low Risk Adult  Completed   Colorectal cancer screening: No longer required.    Mammogram status: No longer required.  Discontinued 09/20/19.   Bone Density status: Completed 12/28/18. Results reflect: Bone density results: OSTEOPENIA. Repeat every 2 years. Taking Vitamin D 1000U, Calcium 1200+D3.   Lung Cancer Screening: (Low Dose CT Chest recommended if Age 51-80 years, 30 pack-year currently smoking OR have quit w/in 15years.) does not qualify.    Hepatitis C Screening: Deferred.  Vision Screening: Recommended annual ophthalmology exams for early detection of glaucoma and other disorders of the eye. Is the patient up to date with their annual eye exam?  Yes  Who is the provider or what is the name of the office in which the patient attends annual eye exams? [facility], Dr. Kerin Ransom.   Dental Screening: Recommended annual dental exams for proper oral hygiene. Dentures.   Community Resource Referral / Chronic Care Management: CRR required this visit?  No   CCM  required this visit?  No      Plan:   Keep all routine maintenance appointments.   Follow up 08/07/20 @ 10:30  I have personally reviewed and noted the following in the patients chart:   Medical and social history Use of alcohol, tobacco or illicit drugs  Current medications and supplements Functional ability and status Nutritional status Physical activity Advanced directives List of other physicians Hospitalizations, surgeries, and ER visits in previous 12 months Vitals Screenings to include cognitive, depression, and falls Referrals and appointments  In addition, I have reviewed and discussed with patient certain preventive protocols, quality metrics, and best practice recommendations. A written personalized care plan for preventive services as well as general preventive health recommendations were provided to patient via mychart.     OBrien-Blaney, Renn Stille L, LPN   [id number]    I have reviewed the above information and agree with above.   Deborra Medina, MD

## 2020-05-28 NOTE — Patient Instructions (Addendum)
  Ms. Falin , Thank you for taking time to come for your Medicare Wellness Visit. I appreciate your ongoing commitment to your health goals. Please review the following plan we discussed and let me know if I can assist you in the future.   These are the goals we discussed: Goals    . Follow up with Primary Care Provider     As needed       This is a list of the screening recommended for you and due dates:  Health Maintenance  Topic Date Due  .  Hepatitis C: One time screening is recommended by Center for Disease Control  (CDC) for  adults born from [birth year] through 1966.   Never done  . Tetanus Vaccine  Never done  . Flu Shot  Completed  . DEXA scan (bone density measurement)  Completed  . COVID-19 Vaccine  Completed  . Pneumonia vaccines  Completed

## 2020-07-26 ENCOUNTER — Encounter: Payer: Self-pay | Admitting: Podiatry

## 2020-07-26 ENCOUNTER — Other Ambulatory Visit: Payer: Self-pay

## 2020-07-26 ENCOUNTER — Ambulatory Visit (INDEPENDENT_AMBULATORY_CARE_PROVIDER_SITE_OTHER): Payer: Medicare Other | Admitting: Podiatry

## 2020-07-26 DIAGNOSIS — B351 Tinea unguium: Secondary | ICD-10-CM | POA: Diagnosis not present

## 2020-07-26 DIAGNOSIS — M79675 Pain in left toe(s): Secondary | ICD-10-CM | POA: Diagnosis not present

## 2020-07-26 DIAGNOSIS — M79674 Pain in right toe(s): Secondary | ICD-10-CM | POA: Diagnosis not present

## 2020-07-26 NOTE — Progress Notes (Signed)
This patient returns to the office for evaluation and treatment of long thick painful nails .  This patient is unable to trim her own nails since the patient cannot reach her feet.  Patient says the nails are painful walking and wearing her shoes.  She returns for preventive foot care services.  General Appearance  Alert, conversant and in no acute stress.  Vascular  Dorsalis pedis and posterior tibial  pulses are palpable  bilaterally.  Capillary return is within normal limits  bilaterally. Temperature is within normal limits  bilaterally.  Neurologic  Senn-Weinstein monofilament wire test within normal limits  bilaterally. Muscle power within normal limits bilaterally.  Nails Thick disfigured discolored nails with subungual debris  from hallux to fifth toes bilaterally. No evidence of bacterial infection or drainage bilaterally.  Pincer nail left hallux.  Orthopedic  No limitations of motion  feet .  No crepitus or effusions noted.  No bony pathology or digital deformities noted.  Skin  normotropic skin with no porokeratosis noted bilaterally.  No signs of infections or ulcers noted.   Asymptomatic clavi 3rd left  And hallux left.  Onychomycosis  Pain in toes right foot  Pain in toes left foot  Debridement  of nails  1-5  B/L with a nail nipper.  Nails were then filed using a dremel tool with no incidents.    RTC  10 weeks    Helane Gunther DPM

## 2020-08-07 ENCOUNTER — Ambulatory Visit: Payer: Medicare Other | Admitting: Internal Medicine

## 2020-08-08 ENCOUNTER — Ambulatory Visit (INDEPENDENT_AMBULATORY_CARE_PROVIDER_SITE_OTHER): Payer: Medicare Other

## 2020-08-08 ENCOUNTER — Encounter: Payer: Self-pay | Admitting: Internal Medicine

## 2020-08-08 ENCOUNTER — Ambulatory Visit (INDEPENDENT_AMBULATORY_CARE_PROVIDER_SITE_OTHER): Payer: Medicare Other | Admitting: Internal Medicine

## 2020-08-08 ENCOUNTER — Other Ambulatory Visit: Payer: Self-pay

## 2020-08-08 VITALS — BP 130/80 | HR 116 | Temp 98.4°F | Ht 63.0 in | Wt 161.6 lb

## 2020-08-08 DIAGNOSIS — N3000 Acute cystitis without hematuria: Secondary | ICD-10-CM | POA: Diagnosis not present

## 2020-08-08 DIAGNOSIS — M25562 Pain in left knee: Secondary | ICD-10-CM

## 2020-08-08 DIAGNOSIS — D649 Anemia, unspecified: Secondary | ICD-10-CM

## 2020-08-08 DIAGNOSIS — M25561 Pain in right knee: Secondary | ICD-10-CM | POA: Diagnosis not present

## 2020-08-08 DIAGNOSIS — G8929 Other chronic pain: Secondary | ICD-10-CM

## 2020-08-08 DIAGNOSIS — Z0000 Encounter for general adult medical examination without abnormal findings: Secondary | ICD-10-CM

## 2020-08-08 DIAGNOSIS — M2550 Pain in unspecified joint: Secondary | ICD-10-CM | POA: Diagnosis not present

## 2020-08-08 NOTE — Progress Notes (Signed)
Chief Complaint  Patient presents with  . Follow-up   F/u with daughter  1. B/l knee pain knee will lock up L>R and having trouble walking short distances with pain mild to moderate nothing tried does not wear brace at times walks with cane  She also have joint changes in her b/l hands and pains at times and daughter ? If she has RA Nothing tried for joint pain 2. No UTI sx's   Review of Systems  Constitutional: Negative for weight loss.  HENT: Negative for hearing loss.   Eyes: Negative for blurred vision.  Respiratory: Negative for shortness of breath.   Cardiovascular: Negative for chest pain and palpitations.  Genitourinary: Negative for dysuria.  Musculoskeletal: Positive for joint pain.  Skin: Negative for rash.  Neurological: Negative for headaches.  Psychiatric/Behavioral: Negative for memory loss.   Past Medical History:  Diagnosis Date  . Arthritis    knees, right shoulder   . History of chicken pox   . UTI (urinary tract infection)    Past Surgical History:  Procedure Laterality Date  . [place]  . EYE SURGERY     cataract 2013/2014 b/l   . FOOT SURGERY     bunion an dhammer toe in 2009   Family History  Problem Relation Age of Onset  . Heart disease Mother        died when pt was 41 y.o   . Heart Problems Mother   . Heart disease Father    Social History   Socioeconomic History  . Marital status: Widowed    Spouse name: Not on file  . Number of children: Not on file  . Years of education: Not on file  . Highest education level: Not on file  Occupational History  . Not on file  Tobacco Use  . Smoking status: Never Smoker  . Smokeless tobacco: Never Used  Vaping Use  . Vaping Use: Never used  Substance and Sexual Activity  . Alcohol use: Yes    Alcohol/week: 1.0 - 2.0 standard drink    Types: 1 - 2 Glasses of wine per week    Comment: occasional drinker  . Drug use: Never  . Sexual activity: Not on file  Other Topics  Concern  . Not on file  Social History Narrative   From [place] lived in [place] since late 1990s early 2000    Lives with daughter    Secretary/administrator ed    Former Pharmacist, hospital    No guns, wears seat belt, safe in relationship    Widowed       2 daughters 1/2 in [employer]    Social Determinants of Health   Financial Resource Strain: Low Risk   . Difficulty of Paying Living Expenses: Not hard at all  Food Insecurity: No Food Insecurity  . Worried About Charity fundraiser in the Last Year: Never true  . Ran Out of Food in the Last Year: Never true  Transportation Needs: No Transportation Needs  . Lack of Transportation (Medical): No  . Lack of Transportation (Non-Medical): No  Physical Activity: Not on file  Stress: No Stress Concern Present  . Feeling of Stress : Not at all  Social Connections: Unknown  . Frequency of Communication with Friends and Family: More than three times a week  . Frequency of Social Gatherings with Friends and Family: Not on file  . Attends Religious Services: Not on file  . Active Member of Clubs or  Organizations: Not on file  . Attends Archivist Meetings: Not on file  . Marital Status: Not on file  Intimate Partner Violence: Not At Risk  . Fear of Current or Ex-Partner: No  . Emotionally Abused: No  . Physically Abused: No  . Sexually Abused: No   Current Meds  Medication Sig  . Apoaequorin (PREVAGEN PO) Take 1 tablet by mouth.  . Calcium-Magnesium-Vitamin D (CALCIUM 1200+D3 PO) daily.   . CHOLECALCIFEROL PO Take 1,000 Units by mouth daily.   . Ferrous Sulfate (IRON) 325 (65 Fe) MG TABS daily.   . NON FORMULARY Hemp Oil 500mg  daily  . UNABLE TO FIND Med Name: D6 100mg  daily   No Known Allergies No results found for this or any previous visit (from the past 2160 hour(s)). Objective  Body mass index is 28.63 kg/m. Wt Readings from Last 3 Encounters:  08/08/20 161 lb 9.6 oz (73.3 kg)  05/28/20 162 lb (73.5 kg)  04/20/20 162 lb 2 oz (73.5 kg)    Temp Readings from Last 3 Encounters:  08/08/20 98.4 F (36.9 C) (Oral)  12/12/19 (!) 97.4 F (36.3 C)  09/29/19 (!) 94.5 F (34.7 C)   BP Readings from Last 3 Encounters:  08/08/20 130/80  05/28/20 133/84  04/20/20 130/80   Pulse Readings from Last 3 Encounters:  08/08/20 (!) 116  04/20/20 90  10/20/19 88    Physical Exam Vitals and nursing note reviewed.  Constitutional:      Appearance: Normal appearance. She is well-developed, well-groomed and overweight.  HENT:     Head: Normocephalic and atraumatic.  Eyes:     Conjunctiva/sclera: Conjunctivae normal.     Pupils: Pupils are equal, round, and reactive to light.  Cardiovascular:     Rate and Rhythm: Normal rate and regular rhythm.     Heart sounds: Normal heart sounds. No murmur heard.   Pulmonary:     Effort: Pulmonary effort is normal.     Breath sounds: Normal breath sounds.  Musculoskeletal:     Right knee: Crepitus present. Tenderness present over the medial joint line and lateral joint line.     Left knee: Crepitus present. Tenderness present over the medial joint line.  Skin:    General: Skin is warm and dry.  Neurological:     General: No focal deficit present.     Mental Status: She is alert and oriented to person, place, and time. Mental status is at baseline.     Gait: Gait normal.  Psychiatric:        Attention and Perception: Attention and perception normal.        Mood and Affect: Mood and affect normal.        Speech: Speech normal.        Behavior: Behavior normal. Behavior is cooperative.        Thought Content: Thought content normal.        Cognition and Memory: Cognition and memory normal.        Judgment: Judgment normal.     Assessment  Plan  Chronic pain of both knees - Plan: DG Knee Complete 4 Views Left Disc voltaren gel otc  Prn tylenol  Polyarthralgia with knee and hand changes OA vs RA- Plan: Antinuclear Antib (ANA), Sedimentation rate, C-reactive protein, Cyclic citrul  peptide antibody, IgG (QUEST), Rheumatoid Factor   HM Flu shot  and prevnar utd and pna 23 Consider shingrix vaccine and Tdap given Rx 08/08/20 covid 2/2 and booster had 08/07/20  Never smoker Pap out of age window  cologuard neg 09/2018  Mammogram negative 08/2018 referred again pt to schedule  dexa 12/2018 osteopenia on calcium 1200 mg qd and vitamin D3 1000 to 2000 iu daily Never had colonoscopy   Skin Dr. Kellie Moor h/o [facility] nose appt sch 12/05/20    Provider: Dr. Olivia Mackie McLean-Scocuzza-Internal Medicine

## 2020-08-08 NOTE — Patient Instructions (Addendum)
Osteoarthritis  Osteoarthritis is a type of arthritis. It refers to joint pain or joint disease. Osteoarthritis affects tissue that covers the ends of bones in joints (cartilage). Cartilage acts as a cushion between the bones and helps them move smoothly. Osteoarthritis occurs when cartilage in the joints gets worn down. Osteoarthritis is sometimes called "wear and tear" arthritis. Osteoarthritis is the most common form of arthritis. It often occurs in older people. It is a condition that gets worse over time. The joints most often affected by this condition are in the fingers, toes, hips, knees, and spine, including the neck and lower back. What are the causes? This condition is caused by the wearing down of cartilage that covers the ends of bones. What increases the risk? The following factors may make you more likely to develop this condition:  Being age 50 or older.  Obesity.  Overuse of joints.  Past injury of a joint.  Past surgery on a joint.  Family history of osteoarthritis. What are the signs or symptoms? The main symptoms of this condition are pain, swelling, and stiffness in the joint. Other symptoms may include:  An enlarged joint.  More pain and further damage caused by small pieces of bone or cartilage that break off and float inside of the joint.  Small deposits of bone (osteophytes) that grow on the edges of the joint.  A grating or scraping feeling inside the joint when you move it.  Popping or creaking sounds when you move.  Difficulty walking or exercising.  An inability to grip items, twist your hand(s), or control the movements of your hands and fingers. How is this diagnosed? This condition may be diagnosed based on:  Your medical history.  A physical exam.  Your symptoms.  X-rays of the affected joint(s).  Blood tests to rule out other types of arthritis. How is this treated? There is no cure for this condition, but treatment can help control  pain and improve joint function. Treatment may include a combination of therapies, such as:  Pain relief techniques, such as: ? Applying heat and cold to the joint. ? Massage. ? A form of talk therapy called cognitive behavioral therapy (CBT). This therapy helps you set goals and follow up on the changes that you make.  Medicines for pain and inflammation. The medicines can be taken by mouth or applied to the skin. They include: ? NSAIDs, such as ibuprofen. ? Prescription medicines. ? Strong anti-inflammatory medicines (corticosteroids). ? Certain nutritional supplements.  A prescribed exercise program. You may work with a physical therapist.  Assistive devices, such as a brace, wrap, splint, specialized glove, or cane.  A weight control plan.  Surgery, such as: ? An osteotomy. This is done to reposition the bones and relieve pain or to remove loose pieces of bone and cartilage. ? Joint replacement surgery. You may need this surgery if you have advanced osteoarthritis. Follow these instructions at home: Activity  Rest your affected joints as told by your health care provider.  Exercise as told by your health care provider. He or she may recommend specific types of exercise, such as: ? Strengthening exercises. These are done to strengthen the muscles that support joints affected by arthritis. ? Aerobic activities. These are exercises, such as brisk walking or water aerobics, that increase your heart rate. ? Range-of-motion activities. These help your joints move more easily. ? Balance and agility exercises. Managing pain, stiffness, and swelling  If directed, apply heat to the affected area as often   as told by your health care provider. Use the heat source that your health care provider recommends, such as a moist heat pack or a heating pad. ? If you have a removable assistive device, remove it as told by your health care provider. ? Place a towel between your skin and the heat  source. If your health care provider tells you to keep the assistive device on while you apply heat, place a towel between the assistive device and the heat source. ? Leave the heat on for 20-30 minutes. ? Remove the heat if your skin turns bright red. This is especially important if you are unable to feel pain, heat, or cold. You may have a greater risk of getting burned.  If directed, put ice on the affected area. To do this: ? If you have a removable assistive device, remove it as told by your health care provider. ? Put ice in a plastic bag. ? Place a towel between your skin and the bag. If your health care provider tells you to keep the assistive device on during icing, place a towel between the assistive device and the bag. ? Leave the ice on for 20 minutes, 2-3 times a day. ? Move your fingers or toes often to reduce stiffness and swelling. ? Raise (elevate) the injured area above the level of your heart while you are sitting or lying down.      General instructions  Take over-the-counter and prescription medicines only as told by your health care provider.  Maintain a healthy weight. Follow instructions from your health care provider for weight control.  Do not use any products that contain nicotine or tobacco, such as cigarettes, e-cigarettes, and chewing tobacco. If you need help quitting, ask your health care provider.  Use assistive devices as told by your health care provider.  Keep all follow-up visits as told by your health care provider. This is important. Where to find more information  [employer] of Arthritis and Musculoskeletal and Skin Diseases: www.niams.SouthExposed.es  [employer] on Aging: http://kim-Murdaugh.com/  American College of Rheumatology: www.rheumatology.org Contact a health care provider if:  You have redness, swelling, or a feeling of warmth in a joint that gets worse.  You have a fever along with joint or muscle aches.  You develop a  rash.  You have trouble doing your normal activities. Get help right away if:  You have pain that gets worse and is not relieved by pain medicine. Summary  Osteoarthritis is a type of arthritis that affects tissue covering the ends of bones in joints (cartilage).  This condition is caused by the wearing down of cartilage that covers the ends of bones.  The main symptom of this condition is pain, swelling, and stiffness in the joint.  There is no cure for this condition, but treatment can help control pain and improve joint function. This information is not intended to replace advice given to you by your health care provider. Make sure you discuss any questions you have with your health care provider. Document Revised: 07/11/2019 Document Reviewed: 07/11/2019 Elsevier Patient Education  2021 [place].  Chronic Knee Pain, Adult Chronic knee pain is pain in one or both knees that lasts longer than 3 months. Symptoms of chronic knee pain may include swelling, stiffness, and discomfort. Age-related wear and tear (osteoarthritis) of the knee joint is the most common cause of chronic knee pain. Other possible causes include:  A long-term immune-related disease that causes inflammation of the knee (rheumatoid  arthritis). This usually affects both knees.  Inflammatory arthritis, such as gout or pseudogout.  An injury to the knee that causes arthritis.  An injury to the knee that damages the ligaments. Ligaments are strong tissues that connect bones to each other.  Runner's knee or pain behind the kneecap. Treatment for chronic knee pain depends on the cause. The main treatments for chronic knee pain are physical therapy and weight loss. This condition may also be treated with medicines, injections, a knee sleeve or brace, and by using crutches. Rest, ice, pressure (compression), and elevation, also known as RICE therapy, may also be recommended. Follow these instructions at home: If you  have a knee sleeve or brace:  Wear the knee sleeve or brace as told by your health care provider. Remove it only as told by your health care provider.  Loosen it if your toes tingle, become numb, or turn cold and blue.  Keep it clean.  If the sleeve or brace is not waterproof: ? Do not let it get wet. ? Remove it if allowed by your health care provider, or cover it with a watertight covering when you take a bath or a shower.   Managing pain, stiffness, and swelling  If directed, apply heat to the affected area as often as told by your health care provider. Use the heat source that your health care provider recommends, such as a moist heat pack or a heating pad. ? If you have a removable knee sleeve or brace, remove it as told by your health care provider. ? Place a towel between your skin and the heat source. ? Leave the heat on for 20-30 minutes. ? Remove the heat if your skin turns bright red. This is especially important if you are unable to feel pain, heat, or cold. You may have a greater risk of getting burned.  If directed, put ice on the affected area. To do this: ? If you have a removable knee sleeve or brace, remove it as told by your health care provider. ? Put ice in a plastic bag. ? Place a towel between your skin and the bag. ? Leave the ice on for 20 minutes, 2-3 times a day. ? Remove the ice if your skin turns bright red. This is very important. If you cannot feel pain, heat, or cold, you have a greater risk of damage to the area.  Move your toes often to reduce stiffness and swelling.  Raise (elevate) the injured area above the level of your heart while you are sitting or lying down.      Activity  Avoid high-impact activities or exercises, such as running, jumping rope, or doing jumping jacks.  Follow the exercise plan that your health care provider designed for you. Your health care provider may suggest that you: ? Avoid activities that make knee pain worse. This  may require you to change your exercise routines, sport participation, or job duties. ? Wear shoes with cushioned soles. ? Avoid sports that require running and sudden changes in direction. ? Do physical therapy. Physical therapy is planned to match your needs and abilities. It may include exercises for strength, flexibility, stability, and endurance. ? Do exercises that increase balance and strength, such as tai chi and yoga.  Do not use the injured limb to support your body weight until your health care provider says that you can. Use crutches as told by your health care provider.  Return to your normal activities as told by your  health care provider. Ask your health care provider what activities are safe for you. General instructions  Take over-the-counter and prescription medicines only as told by your health care provider.  Lose weight if you are overweight. Losing even a little weight can reduce knee pain. Ask your health care provider what your ideal weight is, and how to safely lose extra weight. A dietitian may be able to help you plan your meals.  Do not use any products that contain nicotine or tobacco, such as cigarettes, e-cigarettes, and chewing tobacco. These can delay healing. If you need help quitting, ask your health care provider.  Keep all follow-up visits. This is important. Contact a health care provider if:  You have knee pain that is not getting better or gets worse.  You are unable to do your physical therapy exercises due to knee pain. Get help right away if:  Your knee swells and the swelling becomes worse.  You cannot move your knee.  You have severe knee pain. Summary  Knee pain that lasts more than 3 months is considered chronic knee pain.  The main treatments for chronic knee pain are physical therapy and weight loss. You may also need to take medicines, wear a knee sleeve or brace, use crutches, and apply ice or heat.  Losing even a little weight  can reduce knee pain. Ask your health care provider what your ideal weight is, and how to safely lose extra weight. A dietitian may be able to help you plan your meals.  Follow the exercise plan that your health care provider designed for you. This information is not intended to replace advice given to you by your health care provider. Make sure you discuss any questions you have with your health care provider. Document Revised: 12/28/2019 Document Reviewed: 12/28/2019 Elsevier Patient Education  2021 [employer].

## 2020-08-09 NOTE — Addendum Note (Signed)
Addended by: Orland Mustard on: 08/09/2020 06:04 PM   Modules accepted: Orders

## 2020-08-22 DIAGNOSIS — M170 Bilateral primary osteoarthritis of knee: Secondary | ICD-10-CM | POA: Diagnosis not present

## 2020-08-22 DIAGNOSIS — M25562 Pain in left knee: Secondary | ICD-10-CM | POA: Diagnosis not present

## 2020-08-22 DIAGNOSIS — M25561 Pain in right knee: Secondary | ICD-10-CM | POA: Diagnosis not present

## 2020-09-10 ENCOUNTER — Ambulatory Visit
Admission: RE | Admit: 2020-09-10 | Discharge: 2020-09-10 | Disposition: A | Discharge status: Home / Self Care | Payer: Medicare Other | Source: Ambulatory Visit | Attending: Internal Medicine | Admitting: Internal Medicine

## 2020-09-10 ENCOUNTER — Other Ambulatory Visit: Payer: Self-pay

## 2020-09-10 DIAGNOSIS — Z1231 Encounter for screening mammogram for malignant neoplasm of breast: Secondary | ICD-10-CM

## 2020-09-14 ENCOUNTER — Other Ambulatory Visit: Payer: Self-pay | Admitting: Internal Medicine

## 2020-09-14 DIAGNOSIS — N6489 Other specified disorders of breast: Secondary | ICD-10-CM

## 2020-09-14 DIAGNOSIS — R928 Other abnormal and inconclusive findings on diagnostic imaging of breast: Secondary | ICD-10-CM

## 2020-09-24 ENCOUNTER — Ambulatory Visit
Admission: RE | Admit: 2020-09-24 | Discharge: 2020-09-24 | Disposition: A | Discharge status: Home / Self Care | Payer: Medicare Other | Source: Ambulatory Visit | Attending: Internal Medicine | Admitting: Internal Medicine

## 2020-09-24 ENCOUNTER — Ambulatory Visit: Admission: RE | Admit: 2020-09-24 | Payer: Medicare Other | Source: Ambulatory Visit

## 2020-09-24 ENCOUNTER — Other Ambulatory Visit: Payer: Self-pay

## 2020-09-24 DIAGNOSIS — N6489 Other specified disorders of breast: Secondary | ICD-10-CM | POA: Insufficient documentation

## 2020-09-24 DIAGNOSIS — R928 Other abnormal and inconclusive findings on diagnostic imaging of breast: Secondary | ICD-10-CM | POA: Diagnosis not present

## 2020-09-24 DIAGNOSIS — R922 Inconclusive mammogram: Secondary | ICD-10-CM | POA: Diagnosis not present

## 2020-09-25 DIAGNOSIS — M170 Bilateral primary osteoarthritis of knee: Secondary | ICD-10-CM | POA: Diagnosis not present

## 2020-09-30 DIAGNOSIS — M170 Bilateral primary osteoarthritis of knee: Secondary | ICD-10-CM | POA: Insufficient documentation

## 2020-09-30 HISTORY — DX: Bilateral primary osteoarthritis of knee: M17.0

## 2020-10-04 ENCOUNTER — Other Ambulatory Visit: Payer: Self-pay

## 2020-10-04 ENCOUNTER — Encounter: Payer: Self-pay | Admitting: Podiatry

## 2020-10-04 ENCOUNTER — Ambulatory Visit (INDEPENDENT_AMBULATORY_CARE_PROVIDER_SITE_OTHER): Payer: Medicare Other | Admitting: Podiatry

## 2020-10-04 DIAGNOSIS — M79675 Pain in left toe(s): Secondary | ICD-10-CM | POA: Diagnosis not present

## 2020-10-04 DIAGNOSIS — M79674 Pain in right toe(s): Secondary | ICD-10-CM | POA: Diagnosis not present

## 2020-10-04 DIAGNOSIS — L84 Corns and callosities: Secondary | ICD-10-CM | POA: Diagnosis not present

## 2020-10-04 DIAGNOSIS — B351 Tinea unguium: Secondary | ICD-10-CM

## 2020-10-04 NOTE — Progress Notes (Signed)
This patient returns to the office for evaluation and treatment of long thick painful nails .  This patient is unable to trim her own nails since the patient cannot reach her feet.  Patient says the nails are painful walking and wearing her shoes.  She returns for preventive foot care services.  General Appearance  Alert, conversant and in no acute stress.  Vascular  Dorsalis pedis and posterior tibial  pulses are palpable  bilaterally.  Capillary return is within normal limits  bilaterally. Temperature is within normal limits  bilaterally.  Neurologic  Senn-Weinstein monofilament wire test within normal limits  bilaterally. Muscle power within normal limits bilaterally.  Nails Thick disfigured discolored nails with subungual debris  from hallux to fifth toes bilaterally. No evidence of bacterial infection or drainage bilaterally.  Pincer nail left hallux.  Orthopedic  No limitations of motion  feet .  No crepitus or effusions noted.  No bony pathology or digital deformities noted.  Skin  normotropic skin with no porokeratosis noted bilaterally.  No signs of infections or ulcers noted.   Asymptomatic clavi 3rd left  and callus left  hallux   Onychomycosis  Pain in toes right foot  Pain in toes left foot  Debridement  of nails  1-5  B/L with a nail nipper.  Nails were then filed using a dremel tool with no incidents.    RTC  12 weeks    Gardiner Barefoot DPM

## 2020-11-02 ENCOUNTER — Other Ambulatory Visit: Payer: Self-pay

## 2020-11-02 ENCOUNTER — Other Ambulatory Visit: Payer: Self-pay | Admitting: Internal Medicine

## 2020-11-02 ENCOUNTER — Ambulatory Visit (INDEPENDENT_AMBULATORY_CARE_PROVIDER_SITE_OTHER): Payer: Medicare Other | Admitting: Internal Medicine

## 2020-11-02 ENCOUNTER — Encounter: Payer: Self-pay | Admitting: Internal Medicine

## 2020-11-02 VITALS — BP 120/80 | HR 57 | Temp 97.6°F | Ht 63.0 in | Wt 140.4 lb

## 2020-11-02 DIAGNOSIS — I4891 Unspecified atrial fibrillation: Secondary | ICD-10-CM | POA: Diagnosis not present

## 2020-11-02 DIAGNOSIS — R5383 Other fatigue: Secondary | ICD-10-CM

## 2020-11-02 DIAGNOSIS — I517 Cardiomegaly: Secondary | ICD-10-CM

## 2020-11-02 DIAGNOSIS — N3000 Acute cystitis without hematuria: Secondary | ICD-10-CM | POA: Diagnosis not present

## 2020-11-02 DIAGNOSIS — D72829 Elevated white blood cell count, unspecified: Secondary | ICD-10-CM

## 2020-11-02 DIAGNOSIS — I313 Pericardial effusion (noninflammatory): Secondary | ICD-10-CM | POA: Diagnosis not present

## 2020-11-02 DIAGNOSIS — R600 Localized edema: Secondary | ICD-10-CM | POA: Diagnosis not present

## 2020-11-02 DIAGNOSIS — R1033 Periumbilical pain: Secondary | ICD-10-CM | POA: Diagnosis not present

## 2020-11-02 DIAGNOSIS — I4892 Unspecified atrial flutter: Secondary | ICD-10-CM | POA: Diagnosis not present

## 2020-11-02 DIAGNOSIS — I3139 Other pericardial effusion (noninflammatory): Secondary | ICD-10-CM

## 2020-11-02 DIAGNOSIS — Z01818 Encounter for other preprocedural examination: Secondary | ICD-10-CM

## 2020-11-02 DIAGNOSIS — R009 Unspecified abnormalities of heart beat: Secondary | ICD-10-CM

## 2020-11-02 DIAGNOSIS — R799 Abnormal finding of blood chemistry, unspecified: Secondary | ICD-10-CM

## 2020-11-02 DIAGNOSIS — R630 Anorexia: Secondary | ICD-10-CM | POA: Diagnosis not present

## 2020-11-02 DIAGNOSIS — R634 Abnormal weight loss: Secondary | ICD-10-CM | POA: Diagnosis not present

## 2020-11-02 LAB — COMPREHENSIVE METABOLIC PANEL
ALT: 21 U/L (ref 0–35)
AST: 14 U/L (ref 0–37)
Albumin: 3.5 g/dL (ref 3.5–5.2)
Alkaline Phosphatase: 70 U/L (ref 39–117)
BUN: 30 mg/dL — ABNORMAL HIGH (ref 6–23)
CO2: 29 mEq/L (ref 19–32)
Calcium: 10.6 mg/dL — ABNORMAL HIGH (ref 8.4–10.5)
Chloride: 100 mEq/L (ref 96–112)
Creatinine, Ser: 0.83 mg/dL (ref 0.40–1.20)
GFR: 66.6 mL/min (ref >60.00)
Glucose, Bld: 109 mg/dL — ABNORMAL HIGH (ref 70–99)
Potassium: 3.9 mEq/L (ref 3.5–5.1)
Sodium: 138 mEq/L (ref 135–145)
Total Bilirubin: 1.2 mg/dL (ref 0.2–1.2)
Total Protein: 6 g/dL (ref 6.0–8.3)

## 2020-11-02 LAB — CBC WITH DIFFERENTIAL/PLATELET
Basophils Absolute: 0 10*3/uL (ref 0.0–0.1)
Basophils Relative: 0.3 % (ref 0.0–3.0)
Eosinophils Absolute: 0 10*3/uL (ref 0.0–0.7)
Eosinophils Relative: 0.2 % (ref 0.0–5.0)
HCT: 35.3 % — ABNORMAL LOW (ref 36.0–46.0)
Hemoglobin: 11.5 g/dL — ABNORMAL LOW (ref 12.0–15.0)
Lymphocytes Relative: 12.3 % (ref 12.0–46.0)
Lymphs Abs: 1.3 10*3/uL (ref 0.7–4.0)
MCHC: 32.5 g/dL (ref 30.0–36.0)
MCV: 72.4 fl — ABNORMAL LOW (ref 78.0–100.0)
Monocytes Absolute: 0.9 10*3/uL (ref 0.1–1.0)
Monocytes Relative: 8.3 % (ref 3.0–12.0)
Neutro Abs: 8.5 10*3/uL — ABNORMAL HIGH (ref 1.4–7.7)
Neutrophils Relative %: 78.9 % — ABNORMAL HIGH (ref 43.0–77.0)
Platelets: 146 10*3/uL — ABNORMAL LOW (ref 150.0–400.0)
RBC: 4.87 Mil/uL (ref 3.87–5.11)
RDW: 16.6 % — ABNORMAL HIGH (ref 11.5–15.5)
WBC: 10.8 10*3/uL — ABNORMAL HIGH (ref 4.0–10.5)

## 2020-11-02 LAB — T4, FREE: Free T4: 1.63 ng/dL — ABNORMAL HIGH (ref 0.60–1.60)

## 2020-11-02 LAB — TSH: TSH: 0.72 u[IU]/mL (ref 0.35–4.50)

## 2020-11-02 MED ORDER — FUROSEMIDE 20 MG PO TABS: 20.0000 mg | ORAL_TABLET | Freq: Every day | ORAL | 5 refills | Status: DC

## 2020-11-02 NOTE — Addendum Note (Signed)
Addended by: Leeanne Rio on: 11/02/2020 04:02 PM   Modules accepted: Orders

## 2020-11-02 NOTE — Patient Instructions (Signed)
Premier protein shakes  Lasix 20 mg daily in the am   Edema  Edema is an abnormal buildup of fluids in the body tissues and under the skin. Swelling of the legs, feet, and ankles is a common symptom that becomes more likely as you get older. Swelling is also common in looser tissues, like around the eyes. When the affected area is squeezed, the fluid may move out of that spot and leave a dent for a few moments. This dent is called pitting edema. There are many possible causes of edema. Eating too much salt (sodium) and being on your feet or sitting for a long time can cause edema in your legs, feet, and ankles. Hot weather may make edema worse. Common causes of edema include:  Heart failure.  Liver or kidney disease.  Weak leg blood vessels.  Cancer.  An injury.  Pregnancy.  Medicines.  Being obese.  Low protein levels in the blood. Edema is usually painless. Your skin may look swollen or shiny. Follow these instructions at home:  Keep the affected body part raised (elevated) above the level of your heart when you are sitting or lying down.  Do not sit still or stand for long periods of time.  Do not wear tight clothing. Do not wear garters on your upper legs.  Exercise your legs to get your circulation going. This helps to move the fluid back into your blood vessels, and it may help the swelling go down.  Wear elastic bandages or support stockings to reduce swelling as told by your health care provider.  Eat a low-salt (low-sodium) diet to reduce fluid as told by your health care provider.  Depending on the cause of your swelling, you may need to limit how much fluid you drink (fluid restriction).  Take over-the-counter and prescription medicines only as told by your health care provider. Contact a health care provider if:  Your edema does not get better with treatment.  You have heart, liver, or kidney disease and have symptoms of edema.  You have sudden and  unexplained weight gain. Get help right away if:  You develop shortness of breath or chest pain.  You cannot breathe when you lie down.  You develop pain, redness, or warmth in the swollen areas.  You have heart, liver, or kidney disease and suddenly get edema.  You have a fever and your symptoms suddenly get worse. Summary  Edema is an abnormal buildup of fluids in the body tissues and under the skin.  Eating too much salt (sodium) and being on your feet or sitting for a long time can cause edema in your legs, feet, and ankles.  Keep the affected body part raised (elevated) above the level of your heart when you are sitting or lying down. This information is not intended to replace advice given to you by your health care provider. Make sure you discuss any questions you have with your health care provider. Document Revised: 12/01/2018 Document Reviewed: 08/16/2016 Elsevier Patient Education  2021 [employer].

## 2020-11-02 NOTE — Addendum Note (Signed)
Addended by: Orland Mustard on: 11/02/2020 04:04 PM   Modules accepted: Orders

## 2020-11-02 NOTE — Progress Notes (Signed)
Chief Complaint  Patient presents with  . Low Appetite   F/u with daughter 1. Lost 20 lbs in 3 months, fatigue, leg edema, loss of appetite worse x 1 month daughter seeing she is not eating  2. 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 Left edema L>R no pain new  4. Abnormal heart beat today EKG done   Review of Systems  Constitutional: Positive for malaise/fatigue and weight loss.  HENT: Negative for hearing loss.   Eyes: Negative for blurred vision.  Respiratory: Negative for shortness of breath.   Cardiovascular: Negative for chest pain.  Gastrointestinal: Positive for abdominal pain. Negative for nausea and vomiting.       +loss of appetite Denies dysphagia   Musculoskeletal: Positive for joint pain.  Skin: Negative for rash.  Neurological: Negative for headaches.  Psychiatric/Behavioral: Negative for depression.   Past Medical History:  Diagnosis Date  . Arthritis    knees, right shoulder   . History of chicken pox   . UTI (urinary tract infection)    Past Surgical History:  Procedure Laterality Date  . [place]  . EYE SURGERY     cataract 2013/2014 b/l   . FOOT SURGERY     bunion an dhammer toe in 2009   Family History  Problem Relation Age of Onset  . Heart disease Mother        died when pt was 30 y.o   . Heart Problems Mother   . Heart disease Father    Social History   Socioeconomic History  . Marital status: Widowed    Spouse name: Not on file  . Number of children: Not on file  . Years of education: Not on file  . Highest education level: Not on file  Occupational History  . Not on file  Tobacco Use  . Smoking status: Never Smoker  . Smokeless tobacco: Never Used  Vaping Use  . Vaping Use: Never used  Substance and Sexual Activity  . Alcohol use: Yes    Alcohol/week: 1.0 - 2.0 standard drink    Types: 1 - 2 Glasses of wine per week    Comment: occasional drinker  . Drug use: Never  . Sexual activity: Not on  file  Other Topics Concern  . Not on file  Social History Narrative   From [place] lived in [place] since late 1990s early 2000    Lives with daughter    Secretary/administrator ed    Former Pharmacist, hospital    No guns, wears seat belt, safe in relationship    Widowed       2 daughters 1/2 in [employer]    Social Determinants of Health   Financial Resource Strain: Low Risk   . Difficulty of Paying Living Expenses: Not hard at all  Food Insecurity: No Food Insecurity  . Worried About Charity fundraiser in the Last Year: Never true  . Ran Out of Food in the Last Year: Never true  Transportation Needs: No Transportation Needs  . Lack of Transportation (Medical): No  . Lack of Transportation (Non-Medical): No  Physical Activity: Not on file  Stress: No Stress Concern Present  . Feeling of Stress : Not at all  Social Connections: Unknown  . Frequency of Communication with Friends and Family: More than three times a week  . Frequency of Social Gatherings with Friends and Family: Not on file  . Attends Religious Services: Not on file  . Active  Member of Clubs or Organizations: Not on file  . Attends Archivist Meetings: Not on file  . Marital Status: Not on file  Intimate Partner Violence: Not At Risk  . Fear of Current or Ex-Partner: No  . Emotionally Abused: No  . Physically Abused: No  . Sexually Abused: No   Current Meds  Medication Sig  . Apoaequorin (PREVAGEN PO) Take 1 tablet by mouth.  . Calcium-Magnesium-Vitamin D (CALCIUM 1200+D3 PO) daily.   . CHOLECALCIFEROL PO Take 1,000 Units by mouth daily.   . diclofenac sodium (VOLTAREN) 1 % GEL Apply 2-4 g topically 4 (four) times daily. 2 grams upper arms/shoulders and 4 grams knees  . Ferrous Sulfate (IRON) 325 (65 Fe) MG TABS daily.   . furosemide (LASIX) 20 MG tablet Take 1 tablet (20 mg total) by mouth daily. In am  . NON FORMULARY Hemp Oil 500mg  daily  . UNABLE TO FIND Med Name: D6 100mg  daily  . vitamin B-12 (CYANOCOBALAMIN) 100 MCG  tablet Take 100 mcg by mouth daily.   No Known Allergies No results found for this or any previous visit (from the past 2160 hour(s)). Objective  Body mass index is 24.87 kg/m. Wt Readings from Last 3 Encounters:  11/02/20 140 lb 6.4 oz (63.7 kg)  08/08/20 161 lb 9.6 oz (73.3 kg)  05/28/20 162 lb (73.5 kg)   Temp Readings from Last 3 Encounters:  11/02/20 97.6 F (36.4 C) (Oral)  08/08/20 98.4 F (36.9 C) (Oral)  12/12/19 (!) 97.4 F (36.3 C)   BP Readings from Last 3 Encounters:  11/02/20 120/80  08/08/20 130/80  05/28/20 133/84   Pulse Readings from Last 3 Encounters:  11/02/20 (!) 57  08/08/20 (!) 116  04/20/20 90    Physical Exam Vitals and nursing note reviewed.  Constitutional:      Appearance: Normal appearance. She is well-developed and well-groomed.  HENT:     Head: Normocephalic and atraumatic.  Eyes:     Conjunctiva/sclera: Conjunctivae normal.     Pupils: Pupils are equal, round, and reactive to light.  Cardiovascular:     Rate and Rhythm: Bradycardia present. Rhythm irregular.  Pulmonary:     Effort: Pulmonary effort is normal.     Breath sounds: Normal breath sounds.  Abdominal:     Tenderness: There is abdominal tenderness in the periumbilical area.    Musculoskeletal:     Right lower leg: 2+ Pitting Edema present.     Left lower leg: 2+ Pitting Edema present.  Skin:    General: Skin is warm and dry.  Neurological:     General: No focal deficit present.     Mental Status: She is alert and oriented to person, place, and time. Mental status is at baseline.     Gait: Gait normal.  Psychiatric:        Attention and Perception: Attention and perception normal.        Mood and Affect: Mood and affect normal.        Speech: Speech normal.        Behavior: Behavior normal. Behavior is cooperative.        Thought Content: Thought content normal.        Cognition and Memory: Cognition and memory normal.        Judgment: Judgment normal.      Assessment  Plan  Fatigue, unspecified type - Plan: Comprehensive metabolic panel, CBC w/Diff, TSH, T4, free, CT Chest W Contrast, CT Abdomen Pelvis W Contrast  R/o malignancy as cause   Abnormal weight loss 20 lbs due to low appetite- Plan: Comprehensive metabolic panel, CBC w/Diff, TSH, T4, free, Protein Electrophoresis, (serum), Protein Electrophoresis, Urine Rflx., CT Chest W Contrast, CT Abdomen Pelvis W Contrast Disc premier protein shakes   Periumbilical abdominal pain - Plan: CT Abdomen Pelvis W Contrast Loss of appetite - Plan: Comprehensive metabolic panel, CBC w/Diff, TSH, T4, free, CT Chest W Contrast, CT Abdomen Pelvis W Contrast  Elevated BUN - Plan: Protein Electrophoresis, (serum), Protein Electrophoresis, Urine Rflx., CT Chest W Contrast, CT Abdomen Pelvis W Contrast  Pericardial effusion - Plan: CT Chest W Contrast Cardiomegaly - Plan: CT Chest W Contrast Heart beat abnormality - Plan: EKG 12-Lead  -likely needs to f/u with cards pending [facility] cards    Leg edema - Plan: furosemide (LASIX) 20 MG tablet  HM  HM Flu shot  and prevnar utd and pna 23 Consider shingrix vaccine and Tdapgiven Rx 08/08/20 covid 2/2 and booster had 08/07/20   Never smoker  Pap out of age window  cologuard neg 09/2018  Mammogram negative 09/24/20  dexa 12/2018 osteopenia on calcium 1200 mg qd and vitamin D3 1000 to 2000 iu daily Never had colonoscopy  Skin Dr. Kellie Moor h/o [facility] nose appt sch 12/05/20  Surgery left knee 12/12/20 Dr. Marry Guan arthritis   Provider: Dr. Olivia Mackie McLean-Scocuzza-[facility]

## 2020-11-04 LAB — URINE CULTURE

## 2020-11-06 ENCOUNTER — Other Ambulatory Visit: Payer: Self-pay

## 2020-11-06 ENCOUNTER — Encounter: Payer: Self-pay | Admitting: Cardiology

## 2020-11-06 ENCOUNTER — Other Ambulatory Visit
Admission: RE | Admit: 2020-11-06 | Discharge: 2020-11-06 | Disposition: A | Discharge status: Home / Self Care | Payer: Medicare Other | Source: Ambulatory Visit | Attending: Cardiology | Admitting: Cardiology

## 2020-11-06 ENCOUNTER — Ambulatory Visit (INDEPENDENT_AMBULATORY_CARE_PROVIDER_SITE_OTHER): Payer: Medicare Other | Admitting: Cardiology

## 2020-11-06 VITALS — BP 110/62 | HR 171 | Ht 63.0 in | Wt 140.0 lb

## 2020-11-06 DIAGNOSIS — Z01810 Encounter for preprocedural cardiovascular examination: Secondary | ICD-10-CM | POA: Diagnosis not present

## 2020-11-06 DIAGNOSIS — I4892 Unspecified atrial flutter: Secondary | ICD-10-CM | POA: Diagnosis not present

## 2020-11-06 DIAGNOSIS — Z01812 Encounter for preprocedural laboratory examination: Secondary | ICD-10-CM | POA: Insufficient documentation

## 2020-11-06 DIAGNOSIS — Z20822 Contact with and (suspected) exposure to covid-19: Secondary | ICD-10-CM | POA: Diagnosis not present

## 2020-11-06 LAB — SARS CORONAVIRUS 2 (TAT 6-24 HRS): SARS Coronavirus 2: NEGATIVE

## 2020-11-06 MED ORDER — APIXABAN 5 MG PO TABS: 5.0000 mg | ORAL_TABLET | Freq: Two times a day (BID) | ORAL | 5 refills | Status: DC

## 2020-11-06 MED ORDER — METOPROLOL TARTRATE 25 MG PO TABS: 25.0000 mg | ORAL_TABLET | Freq: Two times a day (BID) | ORAL | 5 refills | Status: DC

## 2020-11-06 MED ORDER — METOPROLOL TARTRATE 12.5 MG HALF TABLET
50.0000 mg | ORAL_TABLET | Freq: Once | ORAL | Status: AC
  Administered 2020-11-06: 50 mg via ORAL

## 2020-11-06 NOTE — H&P (View-Only) (Signed)
Cardiology Office Note:    Date:  11/06/2020   ID:  Kelli Smith, DOB May 12, 1941, MRN [id number]  PCP:  McLean-Scocuzza, Nino Glow, MD  Cardiologist:  No primary care provider on file.  Electrophysiologist:  None   Referring MD: Kelli Smith *   Chief Complaint  Patient presents with  . Other     per Terese Door for afib, aflutter, pre op. Patient c/o swelling in ankles within the last 2 weeks.  Meds reviewed verbally with patient.     History of Present Illness:    Kelli Smith is a 80 y.o. female with a hx of arthritis who presents for follow-up due to abnormal heartbeat and atrial fibrillation.  Patient saw her primary care physician 4 days ago, EKG obtained showed atrial fibrillation with rapid ventricular response.  She denies chest pain, palpitations, dizziness.  She has a history of abnormal heart rhythm, previous cardiac monitor 08/2019 reviewed again by myself showed PACs, paroxysmal atrial fibrillation.  She has bilateral knee arthritis, left knee surgery is being planned next month.  Denies chest pain.  Has some lower extremity edema, resolved with Lasix.  Prior notes Echocardiogram 11/4648 normal systolic function, mild LVH, EF 60 to 65%. Cardiac monitor on 09/2019 reviewed again showing paroxysmal atrial fibrillation.   Past Medical History:  Diagnosis Date  . Arthritis    knees, right shoulder   . History of chicken pox   . UTI (urinary tract infection)     Past Surgical History:  Procedure Laterality Date  . [place]  . EYE SURGERY     cataract 2013/2014 b/l   . FOOT SURGERY     bunion an dhammer toe in 2009    Current Medications: Current Meds  Medication Sig  . apixaban (ELIQUIS) 5 MG TABS tablet Take 1 tablet (5 mg total) by mouth 2 (two) times daily.  . Calcium-Magnesium-Vitamin D (CALCIUM 1200+D3 PO) Take 1 tablet by mouth daily.  . Ferrous Sulfate (IRON) 325 (65 Fe) MG TABS Take 325 mg by mouth daily.  .  furosemide (LASIX) 20 MG tablet Take 1 tablet (20 mg total) by mouth daily. In am  . metoprolol tartrate (LOPRESSOR) 25 MG tablet Take 1 tablet (25 mg total) by mouth 2 (two) times daily.  . vitamin B-12 (CYANOCOBALAMIN) 100 MCG tablet Take 100 mcg by mouth daily.  . [DISCONTINUED] Apoaequorin (PREVAGEN PO) Take 1 tablet by mouth.  . [DISCONTINUED] CHOLECALCIFEROL PO Take 1,000 Units by mouth daily.   . [DISCONTINUED] diclofenac sodium (VOLTAREN) 1 % GEL Apply 2-4 g topically 4 (four) times daily. 2 grams upper arms/shoulders and 4 grams knees (Patient not taking: Reported on 11/06/2020)  . [DISCONTINUED] NON FORMULARY Hemp Oil 500mg  daily  . [DISCONTINUED] UNABLE TO FIND Med Name: D6 100mg  daily     Allergies:   Patient has no known allergies.   Social History   Socioeconomic History  . Marital status: Widowed    Spouse name: Not on file  . Number of children: Not on file  . Years of education: Not on file  . Highest education level: Not on file  Occupational History  . Not on file  Tobacco Use  . Smoking status: Never Smoker  . Smokeless tobacco: Never Used  Vaping Use  . Vaping Use: Never used  Substance and Sexual Activity  . Alcohol use: Yes    Alcohol/week: 1.0 - 2.0 standard drink    Types: 1 - 2 Glasses of wine per  week    Comment: occasional drinker  . Drug use: Never  . Sexual activity: Not on file  Other Topics Concern  . Not on file  Social History Narrative   From [place] lived in [place] since late 1990s early 2000    Lives with daughter    Secretary/administrator ed    Former Pharmacist, hospital    No guns, wears seat belt, safe in relationship    Widowed       2 daughters 1/2 in [employer]    Social Determinants of Health   Financial Resource Strain: Low Risk   . Difficulty of Paying Living Expenses: Not hard at all  Food Insecurity: No Food Insecurity  . Worried About Charity fundraiser in the Last Year: Never true  . Ran Out of Food in the Last Year: Never true  Transportation  Needs: No Transportation Needs  . Lack of Transportation (Medical): No  . Lack of Transportation (Non-Medical): No  Physical Activity: Not on file  Stress: No Stress Concern Present  . Feeling of Stress : Not at all  Social Connections: Unknown  . Frequency of Communication with Friends and Family: More than three times a week  . Frequency of Social Gatherings with Friends and Family: Not on file  . Attends Religious Services: Not on file  . Active Member of Clubs or Organizations: Not on file  . Attends Archivist Meetings: Not on file  . Marital Status: Not on file     Family History: The patient's family history includes Heart Problems in her mother; Heart disease in her father and mother.  ROS:   Please see the history of present illness.     All other systems reviewed and are negative.  EKGs/Labs/Other Studies Reviewed:    The following studies were reviewed today:   EKG:  EKG is  ordered today.  The ekg ordered today demonstrates atrial flutter with 2-1 block, heart rate 171  Recent Labs: 11/02/2020: ALT 21; BUN 30; Creatinine, Ser 0.83; Hemoglobin 11.5; Platelets 146.0; Potassium 3.9; Sodium 138; TSH 0.72  Recent Lipid Panel    Component Value Date/Time   CHOL 161 04/03/2020 0809   TRIG 47.0 04/03/2020 0809   HDL 68.80 04/03/2020 0809   CHOLHDL 2 04/03/2020 0809   VLDL 9.4 04/03/2020 0809   LDLCALC 83 04/03/2020 0809    Physical Exam:    VS:  BP 110/62 (BP Location: Right Arm, Patient Position: Sitting, Cuff Size: Normal)   Pulse (!) 171   Ht 5\' 3"  (1.6 m)   Wt 140 lb (63.5 kg)   SpO2 96%   BMI 24.80 kg/m     Wt Readings from Last 3 Encounters:  11/06/20 140 lb (63.5 kg)  11/02/20 140 lb 6.4 oz (63.7 kg)  08/08/20 161 lb 9.6 oz (73.3 kg)     GEN:  Well nourished, well developed in no acute distress HEENT: Normal NECK: No JVD; No carotid bruits LYMPHATICS: No lymphadenopathy CARDIAC: Irregular irregular, tachycardic, no murmurs RESPIRATORY:   Clear to auscultation without rales, wheezing or rhonchi  ABDOMEN: Soft, non-tender, non-distended MUSCULOSKELETAL:  No edema; No deformity  SKIN: Warm and dry NEUROLOGIC:  Alert and oriented x 3 PSYCHIATRIC:  Normal affect   ASSESSMENT:    1. Atrial flutter, unspecified type ([place])   2. Preop cardiovascular exam    PLAN:    In order of problems listed above:  1. A. fib, atrial flutter., CHA2DS2-VASc score of 3.  EKG shows atrial flutter 2-1  conduction, heart rate 171.  Lopressor given 50 mg x 1, start Lopressor 25 mg twice daily.  Start Eliquis 5 mg twice daily.  Previous echo with normal systolic function, mildly dilated left atrium.  Plan for TEE cardioversion in the hospital with myself in the morning. 2. Left knee arthritis, knee surgery being planned.  Okay to proceed with surgery from a cardiac perspective without additional cardiac testing after arrhythmia is controlled as above.  Last echocardiogram showed normal systolic function, no wall motion abnormalities.  EF 60 to 65%.  Okay to hold Eliquis 48 hours prior to surgical procedure.  Follow-up in 2 weeks after cardioversion  Total encounter time 60 minutes  Greater than 50% was spent in counseling and coordination of care with the patient   Medication Adjustments/Labs and Tests Ordered: Current medicines are reviewed at length with the patient today.  Concerns regarding medicines are outlined above.  Orders Placed This Encounter  Procedures  . CBC  . Basic metabolic panel  . EKG 12-Lead   Meds ordered this encounter  Medications  . metoprolol tartrate (LOPRESSOR) tablet 50 mg  . metoprolol tartrate (LOPRESSOR) 25 MG tablet    Sig: Take 1 tablet (25 mg total) by mouth 2 (two) times daily.    Dispense:  60 tablet    Refill:  5  . apixaban (ELIQUIS) 5 MG TABS tablet    Sig: Take 1 tablet (5 mg total) by mouth 2 (two) times daily.    Dispense:  60 tablet    Refill:  5    Patient Instructions  Medication  Instructions:   Your physician has recommended you make the following change in your medication:   1.  START taking Metoprolol Tartrate (Lopressor) 25 MG twice a day.  2.  START taking Eliquis 5 MG twice a day.   *If you need a refill on your cardiac medications before your next appointment, please call your pharmacy*   Lab Work:  CBC, BMP to be drawn today  If you have labs (blood work) drawn today and your tests are completely normal, you will receive your results only by: Marland Kitchen MyChart Message (if you have MyChart) OR . A paper copy in the mail If you have any lab test that is abnormal or we need to change your treatment, we will call you to review the results.   Testing/Procedures:  You are scheduled for a TEE on ____Wed. 4/12/22____________ with Dr.__AGBOR-Etang_________ Please arrive at the [place] [facility] at __0730______ a.m. on the day of your procedure.  DIET INSTRUCTIONS:  Nothing to eat or drink after midnight except your medications with a small sip of water.         1) Labs: ______done in office____________  2) Medications:  YOU MAY TAKE ALL of your remaining medications with a small amount of water.  3) Must have a responsible person to drive you home.  4) Bring a current list of your medications and current insurance cards.    If you have any questions after you get home, please call the office at [phone]   Follow-Up: At [facility], you and your health needs are our priority.  As part of our continuing mission to provide you with exceptional heart care, we have created designated Provider Care Teams.  These Care Teams include your primary Cardiologist (physician) and Advanced Practice Providers (APPs -  Physician Assistants and Nurse Practitioners) who all work together to provide you with the care you need, when you need  it.  We recommend signing up for the patient portal called "MyChart".  Sign up information is provided on this After Visit  Summary.  MyChart is used to connect with patients for Virtual Visits (Telemedicine).  Patients are able to view lab/test results, encounter notes, upcoming appointments, etc.  Non-urgent messages can be sent to your provider as well.   To learn more about what you can do with MyChart, go to [email or web address].    Your next appointment:   2 week(s)  The format for your next appointment:   In Person  Provider:   Kate Sable, MD   Other Instructions       Signed, Kate Sable, MD  11/06/2020 10:41 AM    [place]

## 2020-11-06 NOTE — Progress Notes (Signed)
Cardiology Office Note:    Date:  11/06/2020   ID:  Kelli Smith, DOB [birth year], MRN [id number]  PCP:  McLean-Scocuzza, Nino Glow, MD  Cardiologist:  No primary care provider on file.  Electrophysiologist:  None   Referring MD: McLean-Scocuzza, Olivia Mackie *   Chief Complaint  Patient presents with  . Other     per Terese Door for afib, aflutter, pre op. Patient c/o swelling in ankles within the last 2 weeks.  Meds reviewed verbally with patient.     History of Present Illness:    Kelli Smith is a 80 y.o. female with a hx of arthritis who presents for follow-up due to abnormal heartbeat and atrial fibrillation.  Patient saw her primary care physician 4 days ago, EKG obtained showed atrial fibrillation with rapid ventricular response.  She denies chest pain, palpitations, dizziness.  She has a history of abnormal heart rhythm, previous cardiac monitor 08/2019 reviewed again by myself showed PACs, paroxysmal atrial fibrillation.  She has bilateral knee arthritis, left knee surgery is being planned next month.  Denies chest pain.  Has some lower extremity edema, resolved with Lasix.  Prior notes Echocardiogram 10/1960 normal systolic function, mild LVH, EF 60 to 65%. Cardiac monitor on 09/2019 reviewed again showing paroxysmal atrial fibrillation.   Past Medical History:  Diagnosis Date  . Arthritis    knees, right shoulder   . History of chicken pox   . UTI (urinary tract infection)     Past Surgical History:  Procedure Laterality Date  . [place]  . EYE SURGERY     cataract 2013/2014 b/l   . FOOT SURGERY     bunion an dhammer toe in 2009    Current Medications: Current Meds  Medication Sig  . apixaban (ELIQUIS) 5 MG TABS tablet Take 1 tablet (5 mg total) by mouth 2 (two) times daily.  . Calcium-Magnesium-Vitamin D (CALCIUM 1200+D3 PO) Take 1 tablet by mouth daily.  . Ferrous Sulfate (IRON) 325 (65 Fe) MG TABS Take 325 mg by mouth daily.  .  furosemide (LASIX) 20 MG tablet Take 1 tablet (20 mg total) by mouth daily. In am  . metoprolol tartrate (LOPRESSOR) 25 MG tablet Take 1 tablet (25 mg total) by mouth 2 (two) times daily.  . vitamin B-12 (CYANOCOBALAMIN) 100 MCG tablet Take 100 mcg by mouth daily.  . [DISCONTINUED] Apoaequorin (PREVAGEN PO) Take 1 tablet by mouth.  . [DISCONTINUED] CHOLECALCIFEROL PO Take 1,000 Units by mouth daily.   . [DISCONTINUED] diclofenac sodium (VOLTAREN) 1 % GEL Apply 2-4 g topically 4 (four) times daily. 2 grams upper arms/shoulders and 4 grams knees (Patient not taking: Reported on 11/06/2020)  . [DISCONTINUED] NON FORMULARY Hemp Oil 500mg  daily  . [DISCONTINUED] UNABLE TO FIND Med Name: D6 100mg  daily     Allergies:   Patient has no known allergies.   Social History   Socioeconomic History  . Marital status: Widowed    Spouse name: Not on file  . Number of children: Not on file  . Years of education: Not on file  . Highest education level: Not on file  Occupational History  . Not on file  Tobacco Use  . Smoking status: Never Smoker  . Smokeless tobacco: Never Used  Vaping Use  . Vaping Use: Never used  Substance and Sexual Activity  . Alcohol use: Yes    Alcohol/week: 1.0 - 2.0 standard drink    Types: 1 - 2 Glasses of wine per  week    Comment: occasional drinker  . Drug use: Never  . Sexual activity: Not on file  Other Topics Concern  . Not on file  Social History Narrative   From [place] lived in [place] since late 1990s early 2000    Lives with daughter    Secretary/administrator ed    Former Pharmacist, hospital    No guns, wears seat belt, safe in relationship    Widowed       2 daughters 1/2 in [employer]    Social Determinants of Health   Financial Resource Strain: Low Risk   . Difficulty of Paying Living Expenses: Not hard at all  Food Insecurity: No Food Insecurity  . Worried About Charity fundraiser in the Last Year: Never true  . Ran Out of Food in the Last Year: Never true  Transportation  Needs: No Transportation Needs  . Lack of Transportation (Medical): No  . Lack of Transportation (Non-Medical): No  Physical Activity: Not on file  Stress: No Stress Concern Present  . Feeling of Stress : Not at all  Social Connections: Unknown  . Frequency of Communication with Friends and Family: More than three times a week  . Frequency of Social Gatherings with Friends and Family: Not on file  . Attends Religious Services: Not on file  . Active Member of Clubs or Organizations: Not on file  . Attends Archivist Meetings: Not on file  . Marital Status: Not on file     Family History: The patient's family history includes Heart Problems in her mother; Heart disease in her father and mother.  ROS:   Please see the history of present illness.     All other systems reviewed and are negative.  EKGs/Labs/Other Studies Reviewed:    The following studies were reviewed today:   EKG:  EKG is  ordered today.  The ekg ordered today demonstrates atrial flutter with 2-1 block, heart rate 171  Recent Labs: 11/02/2020: ALT 21; BUN 30; Creatinine, Ser 0.83; Hemoglobin 11.5; Platelets 146.0; Potassium 3.9; Sodium 138; TSH 0.72  Recent Lipid Panel    Component Value Date/Time   CHOL 161 04/03/2020 0809   TRIG 47.0 04/03/2020 0809   HDL 68.80 04/03/2020 0809   CHOLHDL 2 04/03/2020 0809   VLDL 9.4 04/03/2020 0809   LDLCALC 83 04/03/2020 0809    Physical Exam:    VS:  BP 110/62 (BP Location: Right Arm, Patient Position: Sitting, Cuff Size: Normal)   Pulse (!) 171   Ht 5\' 3"  (1.6 m)   Wt 140 lb (63.5 kg)   SpO2 96%   BMI 24.80 kg/m     Wt Readings from Last 3 Encounters:  11/06/20 140 lb (63.5 kg)  11/02/20 140 lb 6.4 oz (63.7 kg)  08/08/20 161 lb 9.6 oz (73.3 kg)     GEN:  Well nourished, well developed in no acute distress HEENT: Normal NECK: No JVD; No carotid bruits LYMPHATICS: No lymphadenopathy CARDIAC: Irregular irregular, tachycardic, no murmurs RESPIRATORY:   Clear to auscultation without rales, wheezing or rhonchi  ABDOMEN: Soft, non-tender, non-distended MUSCULOSKELETAL:  No edema; No deformity  SKIN: Warm and dry NEUROLOGIC:  Alert and oriented x 3 PSYCHIATRIC:  Normal affect   ASSESSMENT:    1. Atrial flutter, unspecified type ([place])   2. Preop cardiovascular exam    PLAN:    In order of problems listed above:  1. A. fib, atrial flutter., CHA2DS2-VASc score of 3.  EKG shows atrial flutter 2-1  conduction, heart rate 171.  Lopressor given 50 mg x 1, start Lopressor 25 mg twice daily.  Start Eliquis 5 mg twice daily.  Previous echo with normal systolic function, mildly dilated left atrium.  Plan for TEE cardioversion in the hospital with myself in the morning. 2. Left knee arthritis, knee surgery being planned.  Okay to proceed with surgery from a cardiac perspective without additional cardiac testing after arrhythmia is controlled as above.  Last echocardiogram showed normal systolic function, no wall motion abnormalities.  EF 60 to 65%.  Okay to hold Eliquis 48 hours prior to surgical procedure.  Follow-up in 2 weeks after cardioversion  Total encounter time 60 minutes  Greater than 50% was spent in counseling and coordination of care with the patient   Medication Adjustments/Labs and Tests Ordered: Current medicines are reviewed at length with the patient today.  Concerns regarding medicines are outlined above.  Orders Placed This Encounter  Procedures  . CBC  . Basic metabolic panel  . EKG 12-Lead   Meds ordered this encounter  Medications  . metoprolol tartrate (LOPRESSOR) tablet 50 mg  . metoprolol tartrate (LOPRESSOR) 25 MG tablet    Sig: Take 1 tablet (25 mg total) by mouth 2 (two) times daily.    Dispense:  60 tablet    Refill:  5  . apixaban (ELIQUIS) 5 MG TABS tablet    Sig: Take 1 tablet (5 mg total) by mouth 2 (two) times daily.    Dispense:  60 tablet    Refill:  5    Patient Instructions  Medication  Instructions:   Your physician has recommended you make the following change in your medication:   1.  START taking Metoprolol Tartrate (Lopressor) 25 MG twice a day.  2.  START taking Eliquis 5 MG twice a day.   *If you need a refill on your cardiac medications before your next appointment, please call your pharmacy*   Lab Work:  CBC, BMP to be drawn today  If you have labs (blood work) drawn today and your tests are completely normal, you will receive your results only by: Marland Kitchen MyChart Message (if you have MyChart) OR . A paper copy in the mail If you have any lab test that is abnormal or we need to change your treatment, we will call you to review the results.   Testing/Procedures:  You are scheduled for a TEE on ____Wed. 4/12/22____________ with Dr.__AGBOR-Etang_________ Please arrive at the [place] [facility] at __0730______ a.m. on the day of your procedure.  DIET INSTRUCTIONS:  Nothing to eat or drink after midnight except your medications with a small sip of water.         1) Labs: ______done in office____________  2) Medications:  YOU MAY TAKE ALL of your remaining medications with a small amount of water.  3) Must have a responsible person to drive you home.  4) Bring a current list of your medications and current insurance cards.    If you have any questions after you get home, please call the office at [phone]   Follow-Up: At [facility], you and your health needs are our priority.  As part of our continuing mission to provide you with exceptional heart care, we have created designated Provider Care Teams.  These Care Teams include your primary Cardiologist (physician) and Advanced Practice Providers (APPs -  Physician Assistants and Nurse Practitioners) who all work together to provide you with the care you need, when you need  it.  We recommend signing up for the patient portal called "MyChart".  Sign up information is provided on this After Visit  Summary.  MyChart is used to connect with patients for Virtual Visits (Telemedicine).  Patients are able to view lab/test results, encounter notes, upcoming appointments, etc.  Non-urgent messages can be sent to your provider as well.   To learn more about what you can do with MyChart, go to [email or web address].    Your next appointment:   2 week(s)  The format for your next appointment:   In Person  Provider:   Kate Sable, MD   Other Instructions       Signed, Kate Sable, MD  11/06/2020 10:41 AM    [place]

## 2020-11-06 NOTE — Patient Instructions (Signed)
Medication Instructions:   Your physician has recommended you make the following change in your medication:   1.  START taking Metoprolol Tartrate (Lopressor) 25 MG twice a day.  2.  START taking Eliquis 5 MG twice a day.   *If you need a refill on your cardiac medications before your next appointment, please call your pharmacy*   Lab Work:  CBC, BMP to be drawn today  If you have labs (blood work) drawn today and your tests are completely normal, you will receive your results only by: Marland Kitchen MyChart Message (if you have MyChart) OR . A paper copy in the mail If you have any lab test that is abnormal or we need to change your treatment, we will call you to review the results.   Testing/Procedures:  You are scheduled for a TEE on ____Wed. 4/12/22____________ with Dr.__AGBOR-Etang_________ Please arrive at the [place] [facility] at __0730______ a.m. on the day of your procedure.  DIET INSTRUCTIONS:  Nothing to eat or drink after midnight except your medications with a small sip of water.         1) Labs: ______done in office____________  2) Medications:  YOU MAY TAKE ALL of your remaining medications with a small amount of water.  3) Must have a responsible person to drive you home.  4) Bring a current list of your medications and current insurance cards.    If you have any questions after you get home, please call the office at [phone]   Follow-Up: At [facility], you and your health needs are our priority.  As part of our continuing mission to provide you with exceptional heart care, we have created designated Provider Care Teams.  These Care Teams include your primary Cardiologist (physician) and Advanced Practice Providers (APPs -  Physician Assistants and Nurse Practitioners) who all work together to provide you with the care you need, when you need it.  We recommend signing up for the patient portal called "MyChart".  Sign up information is provided on this After  Visit Summary.  MyChart is used to connect with patients for Virtual Visits (Telemedicine).  Patients are able to view lab/test results, encounter notes, upcoming appointments, etc.  Non-urgent messages can be sent to your provider as well.   To learn more about what you can do with MyChart, go to [email or web address].    Your next appointment:   2 week(s)  The format for your next appointment:   In Person  Provider:   Kate Sable, MD   Other Instructions

## 2020-11-07 ENCOUNTER — Ambulatory Visit (HOSPITAL_BASED_OUTPATIENT_CLINIC_OR_DEPARTMENT_OTHER)
Admission: RE | Admit: 2020-11-07 | Discharge: 2020-11-07 | Disposition: A | Discharge status: Home / Self Care | Payer: Medicare Other | Source: Home / Self Care | Attending: Cardiology | Admitting: Cardiology

## 2020-11-07 ENCOUNTER — Ambulatory Visit: Payer: Medicare Other | Admitting: Anesthesiology

## 2020-11-07 ENCOUNTER — Encounter: Payer: Self-pay | Admitting: Cardiology

## 2020-11-07 ENCOUNTER — Other Ambulatory Visit: Payer: Self-pay

## 2020-11-07 ENCOUNTER — Ambulatory Visit
Admission: RE | Admit: 2020-11-07 | Discharge: 2020-11-07 | Disposition: A | Discharge status: Home / Self Care | Payer: Medicare Other | Attending: Cardiology | Admitting: Cardiology

## 2020-11-07 ENCOUNTER — Encounter
Admission: RE | Disposition: A | Discharge status: Home / Self Care | Payer: Self-pay | Source: Home / Self Care | Attending: Cardiology

## 2020-11-07 ENCOUNTER — Telehealth: Payer: Self-pay | Admitting: Cardiology

## 2020-11-07 DIAGNOSIS — I340 Nonrheumatic mitral (valve) insufficiency: Secondary | ICD-10-CM | POA: Insufficient documentation

## 2020-11-07 DIAGNOSIS — N390 Urinary tract infection, site not specified: Secondary | ICD-10-CM | POA: Diagnosis not present

## 2020-11-07 DIAGNOSIS — I361 Nonrheumatic tricuspid (valve) insufficiency: Secondary | ICD-10-CM

## 2020-11-07 DIAGNOSIS — Z79899 Other long term (current) drug therapy: Secondary | ICD-10-CM | POA: Insufficient documentation

## 2020-11-07 DIAGNOSIS — I4819 Other persistent atrial fibrillation: Secondary | ICD-10-CM | POA: Diagnosis not present

## 2020-11-07 DIAGNOSIS — Z7901 Long term (current) use of anticoagulants: Secondary | ICD-10-CM | POA: Insufficient documentation

## 2020-11-07 DIAGNOSIS — I4892 Unspecified atrial flutter: Secondary | ICD-10-CM

## 2020-11-07 DIAGNOSIS — I480 Paroxysmal atrial fibrillation: Secondary | ICD-10-CM | POA: Insufficient documentation

## 2020-11-07 DIAGNOSIS — M170 Bilateral primary osteoarthritis of knee: Secondary | ICD-10-CM | POA: Diagnosis not present

## 2020-11-07 DIAGNOSIS — M19011 Primary osteoarthritis, right shoulder: Secondary | ICD-10-CM | POA: Diagnosis not present

## 2020-11-07 HISTORY — PX: CARDIOVERSION: SHX1299

## 2020-11-07 HISTORY — PX: TEE WITHOUT CARDIOVERSION: SHX5443

## 2020-11-07 LAB — PROTEIN ELECTROPHORESIS, SERUM
A/G Ratio: 1.1 (ref 0.7–1.7)
Albumin ELP: 3.2 g/dL (ref 2.9–4.4)
Alpha 1: 0.3 g/dL (ref 0.0–0.4)
Alpha 2: 0.7 g/dL (ref 0.4–1.0)
Beta: 0.9 g/dL (ref 0.7–1.3)
Gamma Globulin: 0.8 g/dL (ref 0.4–1.8)
Globulin, Total: 2.8 g/dL (ref 2.2–3.9)
Total Protein: 6 g/dL (ref 6.0–8.5)

## 2020-11-07 LAB — CBC
Hematocrit: 34.1 % (ref 34.0–46.6)
Hemoglobin: 11.2 g/dL (ref 11.1–15.9)
MCH: 23.5 pg — ABNORMAL LOW (ref 26.6–33.0)
MCHC: 32.8 g/dL (ref 31.5–35.7)
MCV: 72 fL — ABNORMAL LOW (ref 79–97)
Platelets: 188 10*3/uL (ref 150–450)
RBC: 4.76 x10E6/uL (ref 3.77–5.28)
RDW: 17 % — ABNORMAL HIGH (ref 11.7–15.4)
WBC: 8.9 10*3/uL (ref 3.4–10.8)

## 2020-11-07 LAB — BASIC METABOLIC PANEL
BUN/Creatinine Ratio: 36 — ABNORMAL HIGH (ref 12–28)
BUN: 27 mg/dL (ref 8–27)
CO2: 19 mmol/L — ABNORMAL LOW (ref 20–29)
Calcium: 9.6 mg/dL (ref 8.7–10.3)
Chloride: 101 mmol/L (ref 96–106)
Creatinine, Ser: 0.75 mg/dL (ref 0.57–1.00)
Glucose: 114 mg/dL — ABNORMAL HIGH (ref 65–99)
Potassium: 4.4 mmol/L (ref 3.5–5.2)
Sodium: 141 mmol/L (ref 134–144)
eGFR: 80 mL/min/{1.73_m2} (ref >59)

## 2020-11-07 SURGERY — ECHOCARDIOGRAM, TRANSESOPHAGEAL
Anesthesia: General

## 2020-11-07 MED ORDER — LIDOCAINE VISCOUS HCL 2 % MT SOLN
OROMUCOSAL | Status: AC
  Filled 2020-11-07: qty 15

## 2020-11-07 MED ORDER — SODIUM CHLORIDE FLUSH 0.9 % IV SOLN
INTRAVENOUS | Status: AC
  Filled 2020-11-07: qty 10

## 2020-11-07 MED ORDER — FENTANYL CITRATE (PF) 100 MCG/2ML IJ SOLN: 25.0000 ug | INTRAMUSCULAR | Status: DC | PRN

## 2020-11-07 MED ORDER — PROPOFOL 10 MG/ML IV BOLUS
INTRAVENOUS | Status: DC | PRN
  Administered 2020-11-07: 20 mg via INTRAVENOUS
  Administered 2020-11-07: 30 mg via INTRAVENOUS
  Administered 2020-11-07: 20 mg via INTRAVENOUS
  Administered 2020-11-07: 10 mg via INTRAVENOUS

## 2020-11-07 MED ORDER — ONDANSETRON HCL 4 MG/2ML IJ SOLN: 4.0000 mg | Freq: Once | INTRAMUSCULAR | Status: DC | PRN

## 2020-11-07 MED ORDER — SODIUM CHLORIDE 0.9 % IV SOLN: INTRAVENOUS | Status: DC

## 2020-11-07 MED ORDER — PHENYLEPHRINE HCL (PRESSORS) 10 MG/ML IV SOLN
INTRAVENOUS | Status: DC | PRN
  Administered 2020-11-07 (×4): 100 ug via INTRAVENOUS

## 2020-11-07 MED ORDER — AMIODARONE HCL 200 MG PO TABS
400.0000 mg | ORAL_TABLET | Freq: Two times a day (BID) | ORAL | 3 refills | Status: DC
Start: 2020-11-07 — End: 2020-11-16

## 2020-11-07 NOTE — Telephone Encounter (Signed)
Juliann Pulse from [facility] calling stating the patient was supposed to be scheduled for a TEE Cardioversion but was scheduled as a TEE. Juliann Pulse stated this caused a delay and just wanted to check and make sure nothing was missed or what the confusion may have been

## 2020-11-07 NOTE — Anesthesia Postprocedure Evaluation (Signed)
Anesthesia Post Note  Patient: Kelli Smith  Procedure(s) Performed: TRANSESOPHAGEAL ECHOCARDIOGRAM (TEE) (N/A )  Patient location during evaluation: PACU Anesthesia Type: General Level of consciousness: awake and alert Pain management: pain level controlled Vital Signs Assessment: post-procedure vital signs reviewed and stable Respiratory status: spontaneous breathing, nonlabored ventilation, respiratory function stable and patient connected to nasal cannula oxygen Cardiovascular status: blood pressure returned to baseline and stable Postop Assessment: no apparent nausea or vomiting Anesthetic complications: no   No complications documented.   Last Vitals:  Vitals:   11/07/20 0945 11/07/20 1000  BP: 90/63 92/74  Pulse: (!) 42   Resp: 12 14  SpO2: 99% 100%    Last Pain:  Vitals:   11/07/20 1000  PainSc: 0-No pain                 Molli Barrows

## 2020-11-07 NOTE — Anesthesia Preprocedure Evaluation (Signed)
Anesthesia Evaluation  Patient identified by MRN, date of birth, ID band Patient awake    Reviewed: Allergy & Precautions, H&P , NPO status , Patient's Chart, lab work & pertinent test results, reviewed documented beta blocker date and time   Airway Mallampati: II   Neck ROM: full    Dental  (+) Poor Dentition, Edentulous Upper, Edentulous Lower   Pulmonary neg pulmonary ROS,    Pulmonary exam normal        Cardiovascular Exercise Tolerance: Poor negative cardio ROS  Atrial Fibrillation  Rhythm:regular Rate:Normal     Neuro/Psych  Neuromuscular disease negative psych ROS   GI/Hepatic negative GI ROS, Neg liver ROS,   Endo/Other  negative endocrine ROS  Renal/GU negative Renal ROS  negative genitourinary   Musculoskeletal   Abdominal   Peds  Hematology negative hematology ROS (+)   Anesthesia Other Findings Past Medical History: No date: Arthritis     Comment:  knees, right shoulder  No date: History of chicken pox No date: UTI (urinary tract infection) Past Surgical History: No date: CESAREAN SECTION     Comment:  1978 No date: EYE SURGERY     Comment:  cataract 2013/2014 b/l  No date: FOOT SURGERY     Comment:  bunion an dhammer toe in 2009   Reproductive/Obstetrics negative OB ROS                             Anesthesia Physical Anesthesia Plan  ASA: IV  Anesthesia Plan: General   Post-op Pain Management:    Induction:   PONV Risk Score and Plan:   Airway Management Planned:   Additional Equipment:   Intra-op Plan:   Post-operative Plan:   Informed Consent: I have reviewed the patients History and Physical, chart, labs and discussed the procedure including the risks, benefits and alternatives for the proposed anesthesia with the patient or authorized representative who has indicated his/her understanding and acceptance.     Dental Advisory Given  Plan  Discussed with: CRNA  Anesthesia Plan Comments:         Anesthesia Quick Evaluation

## 2020-11-07 NOTE — Discharge Instructions (Signed)
Electrical Cardioversion Electrical cardioversion is the delivery of a jolt of electricity to restore a normal rhythm to the heart. A rhythm that is too fast or is not regular keeps the heart from pumping well. In this procedure, sticky patches or metal paddles are placed on the chest to deliver electricity to the heart from a device. This procedure may be done in an emergency if:  There is low or no blood pressure as a result of the heart rhythm.  Normal rhythm must be restored as fast as possible to protect the brain and heart from further damage.  It may save a life. This may also be a scheduled procedure for irregular or fast heart rhythms that are not immediately life-threatening. Tell a health care provider about:  Any allergies you have.  All medicines you are taking, including vitamins, herbs, eye drops, creams, and over-the-counter medicines.  Any problems you or family members have had with anesthetic medicines.  Any blood disorders you have.  Any surgeries you have had.  Any medical conditions you have.  Whether you are pregnant or may be pregnant. What are the risks? Generally, this is a safe procedure. However, problems may occur, including:  Allergic reactions to medicines.  A blood clot that breaks free and travels to other parts of your body.  The possible return of an abnormal heart rhythm within hours or days after the procedure.  Your heart stopping (cardiac arrest). This is rare. What happens before the procedure? Medicines  Your health care provider may have you start taking: ? Blood-thinning medicines (anticoagulants) so your blood does not clot as easily. ? Medicines to help stabilize your heart rate and rhythm.  Ask your health care provider about: ? Changing or stopping your regular medicines. This is especially important if you are taking diabetes medicines or blood thinners. ? Taking medicines such as aspirin and ibuprofen. These medicines can  thin your blood. Do not take these medicines unless your health care provider tells you to take them. ? Taking over-the-counter medicines, vitamins, herbs, and supplements. General instructions  Follow instructions from your health care provider about eating or drinking restrictions.  Plan to have someone take you home from the hospital or clinic.  If you will be going home right after the procedure, plan to have someone with you for 24 hours.  Ask your health care provider what steps will be taken to help prevent infection. These may include washing your skin with a germ-killing soap. What happens during the procedure?  An IV will be inserted into one of your veins.  Sticky patches (electrodes) or metal paddles may be placed on your chest.  You will be given a medicine to help you relax (sedative).  An electrical shock will be delivered. The procedure may vary among health care providers and hospitals.   What can I expect after the procedure?  Your blood pressure, heart rate, breathing rate, and blood oxygen level will be monitored until you leave the hospital or clinic.  Your heart rhythm will be watched to make sure it does not change.  You may have some redness on the skin where the shocks were given. Follow these instructions at home:  Do not drive for 24 hours if you were given a sedative during your procedure.  Take over-the-counter and prescription medicines only as told by your health care provider.  Ask your health care provider how to check your pulse. Check it often.  Rest for 48 hours after the procedure   or as told by your health care provider.  Avoid or limit your caffeine use as told by your health care provider.  Keep all follow-up visits as told by your health care provider. This is important. Contact a health care provider if:  You feel like your heart is beating too quickly or your pulse is not regular.  You have a serious muscle cramp that does not go  away. Get help right away if:  You have discomfort in your chest.  You are dizzy or you feel faint.  You have trouble breathing or you are short of breath.  Your speech is slurred.  You have trouble moving an arm or leg on one side of your body.  Your fingers or toes turn cold or blue. Summary  Electrical cardioversion is the delivery of a jolt of electricity to restore a normal rhythm to the heart.  This procedure may be done right away in an emergency or may be a scheduled procedure if the condition is not an emergency.  Generally, this is a safe procedure.  After the procedure, check your pulse often as told by your health care provider. This information is not intended to replace advice given to you by your health care provider. Make sure you discuss any questions you have with your health care provider. Document Revised: 02/14/2019 Document Reviewed: 02/14/2019 Elsevier Patient Education  2021 Elsevier Inc.  

## 2020-11-07 NOTE — Progress Notes (Signed)
*  PRELIMINARY RESULTS* Echocardiogram Echocardiogram Transesophageal has been performed.  [name] 11/07/2020, 9:21 AM

## 2020-11-07 NOTE — Transfer of Care (Signed)
Immediate Anesthesia Transfer of Care Note  Patient: Letticia Bhattacharyya  Procedure(s) Performed: TRANSESOPHAGEAL ECHOCARDIOGRAM (TEE) (N/A )  Patient Location: PACU and Nursing Unit  Anesthesia Type:General  Level of Consciousness: drowsy and patient cooperative  Airway & Oxygen Therapy: Patient Spontanous Breathing and Patient connected to nasal cannula oxygen  Post-op Assessment: Report given to RN and Post -op Vital signs reviewed and stable  Post vital signs: Reviewed and stable  Last Vitals:  Vitals Value Taken Time  BP 91/71 11/07/20 0932  Temp    Pulse 48 11/07/20 0933  Resp 16 11/07/20 0933  SpO2 90 % 11/07/20 0933  Vitals shown include unvalidated device data.  Last Pain: There were no vitals filed for this visit.       Complications: No complications documented.

## 2020-11-07 NOTE — Procedures (Addendum)
Transesophageal Echocardiogram :  Indication: atrial fibrillation/ atrial flutter Requesting/ordering  physician:   Procedure: 10cc of viscous lidocaine were given orally to provide local anesthesia to the oropharynx. The patient was positioned supine on the left side, bite block provided. The patient was  sedated per anesthesia team.  Using digital technique an omniplane probe was advanced into the esophagus without incident.   See report in EPIC  for complete details: In brief, imaging revealed normal LV function with no RWMAs and no mural apical thrombus.  .  Estimated ejection fraction was 55%%.  Right sided cardiac chambers were normal with no evidence of pulmonary hypertension.  Moderate to severely dilated left atrium.  Imaging of the septum showed no ASD or VSD Bubble study was negative for shunt 2D and color flow confirmed no PFO  The LA was well visualized in orthogonal views.  There was no spontaneous contrast and no thrombus in the LA and LA appendage   The descending thoracic aorta had no  mural aortic debris with no evidence of aneurysmal dilation or dissection  Cardioversion procedure note For atrial fibrillation.  Procedure Details:  Consent: Risks of procedure as well as the alternatives and risks of each were explained to the (patient/caregiver).  Consent for procedure obtained.  Time Out: Verified patient identification, verified procedure, site/side was marked, verified correct patient position, special equipment/implants available, medications/allergies/relevent history reviewed, required imaging and test results available.  Performed  Patient placed on cardiac monitor, pulse oximetry, supplemental oxygen as necessary.   Sedation given: propofol IV,per anesthesia team Pacer pads placed anterior and posterior chest.   Cardioverted 2 time(s).   Cardioverted at  [place]. Synchronized biphasic Converted to NSR   Evaluation: Findings: Post procedure EKG shows:  Sinus rhythm, frequent PAC's Complications: None Patient did tolerate procedure well.  Dilated left atrium, high chances for going back to afib. Will start amiodarone 400mg  bid x 1 week, then decrease to 200mg  bid after.  Time Spent Directly with the Patient:  35 minutes   Kate Sable, M.D.   Aaron Edelman Agbor-Etang 11/07/2020 9:32 AM

## 2020-11-07 NOTE — Interval H&P Note (Signed)
History and Physical Interval Note:  11/07/2020 9:31 AM  Kelli Smith  has presented today for surgery, with the diagnosis of Afib/Aflutter.  The various methods of treatment have been discussed with the patient and family. After consideration of risks, benefits and other options for treatment, the patient has consented to  Procedure(s): TRANSESOPHAGEAL ECHOCARDIOGRAM (TEE) (N/A) with cardioversion as a surgical intervention.  The patient's history has been reviewed, patient examined, no change in status, stable for surgery.  I have reviewed the patient's chart and labs.  Questions were answered to the patient's satisfaction.     Aaron Edelman Agbor-Etang

## 2020-11-08 ENCOUNTER — Encounter: Payer: Self-pay | Admitting: Cardiology

## 2020-11-08 NOTE — Telephone Encounter (Signed)
Noted. Procedure was performed. Closing encounter.

## 2020-11-16 ENCOUNTER — Telehealth: Payer: Self-pay

## 2020-11-16 ENCOUNTER — Other Ambulatory Visit: Payer: Self-pay

## 2020-11-16 ENCOUNTER — Ambulatory Visit (INDEPENDENT_AMBULATORY_CARE_PROVIDER_SITE_OTHER): Payer: Medicare Other | Admitting: Internal Medicine

## 2020-11-16 ENCOUNTER — Encounter: Payer: Self-pay | Admitting: Internal Medicine

## 2020-11-16 VITALS — BP 138/70 | HR 55 | Temp 98.3°F | Ht 63.0 in | Wt 143.1 lb

## 2020-11-16 DIAGNOSIS — I4892 Unspecified atrial flutter: Secondary | ICD-10-CM

## 2020-11-16 DIAGNOSIS — R632 Polyphagia: Secondary | ICD-10-CM

## 2020-11-16 DIAGNOSIS — E041 Nontoxic single thyroid nodule: Secondary | ICD-10-CM

## 2020-11-16 MED ORDER — AMIODARONE HCL 200 MG PO TABS: 200.0000 mg | ORAL_TABLET | Freq: Every day | ORAL | 3 refills | Status: DC

## 2020-11-16 NOTE — Telephone Encounter (Signed)
Called patients daughter and informed her of the  Instructions below from Dr. Garen Lah. She verbalized understanding, agreed with plan, and confirmed that they would be here Monday 11/19/20 for her follow up appointment.

## 2020-11-16 NOTE — Patient Instructions (Signed)
[place] EKG

## 2020-11-16 NOTE — Telephone Encounter (Signed)
   Kelli Smith,   Please advise patient to stop Lopressor due to bradycardia.   Reduce amiodarone to 200 mg daily.   Keep follow-up appointment with myself.   Ty  BA

## 2020-11-16 NOTE — Progress Notes (Addendum)
Chief Complaint  Patient presents with  . Follow-up   F/u with daughter  1. Fatigue improved and atrial flutter had cardioversion 11/14/20 and doing better on amiodarone 200, eliquis and lopressor 25 mg bid but HR is in 40s-50s at home pt tolerating meds ok  2. Appetite improved and eating better gained 3 lbs since last visit and CT scans pending end of 10/2020    Review of Systems  Constitutional: Positive for weight loss. Negative for malaise/fatigue.  HENT: Negative for hearing loss.   Respiratory: Negative for shortness of breath.   Cardiovascular: Negative for chest pain.  Gastrointestinal:       +appetite improved   Musculoskeletal: Positive for joint pain.  Skin: Negative for rash.   Past Medical History:  Diagnosis Date  . Arthritis    knees, right shoulder   . History of chicken pox   . UTI (urinary tract infection)    Past Surgical History:  Procedure Laterality Date  . [place]  . EYE SURGERY     cataract 2013/2014 b/l   . FOOT SURGERY     bunion an dhammer toe in 2009  . TEE WITHOUT CARDIOVERSION N/A 11/07/2020   Procedure: TRANSESOPHAGEAL ECHOCARDIOGRAM (TEE);  Surgeon: Kate Sable, MD;  Location: [facility] ORS;  Service: Cardiovascular;  Laterality: N/A;   Family History  Problem Relation Age of Onset  . Heart disease Mother        died when pt was 47 y.o   . Heart Problems Mother   . Heart disease Father    Social History   Socioeconomic History  . Marital status: Widowed    Spouse name: Not on file  . Number of children: Not on file  . Years of education: Not on file  . Highest education level: Not on file  Occupational History  . Not on file  Tobacco Use  . Smoking status: Never Smoker  . Smokeless tobacco: Never Used  Vaping Use  . Vaping Use: Never used  Substance and Sexual Activity  . Alcohol use: Yes    Alcohol/week: 1.0 - 2.0 standard drink    Types: 1 - 2 Glasses of wine per week    Comment: occasional drinker  .  Drug use: Never  . Sexual activity: Not on file  Other Topics Concern  . Not on file  Social History Narrative   From [place] lived in [place] since late 1990s early 2000    Lives with daughter    Secretary/administrator ed    Former Pharmacist, hospital    No guns, wears seat belt, safe in relationship    Widowed       2 daughters 1/2 in [employer]    Social Determinants of Health   Financial Resource Strain: Low Risk   . Difficulty of Paying Living Expenses: Not hard at all  Food Insecurity: No Food Insecurity  . Worried About Charity fundraiser in the Last Year: Never true  . Ran Out of Food in the Last Year: Never true  Transportation Needs: No Transportation Needs  . Lack of Transportation (Medical): No  . Lack of Transportation (Non-Medical): No  Physical Activity: Not on file  Stress: No Stress Concern Present  . Feeling of Stress : Not at all  Social Connections: Unknown  . Frequency of Communication with Friends and Family: More than three times a week  . Frequency of Social Gatherings with Friends and Family: Not on file  . Attends Religious Services:  Not on file  . Active Member of Clubs or Organizations: Not on file  . Attends Archivist Meetings: Not on file  . Marital Status: Not on file  Intimate Partner Violence: Not At Risk  . Fear of Current or Ex-Partner: No  . Emotionally Abused: No  . Physically Abused: No  . Sexually Abused: No   Current Meds  Medication Sig  . amiodarone (PACERONE) 200 MG tablet Take 2 tablets (400 mg total) by mouth 2 (two) times daily. Take 440m twice daily x 1 week, then 2063mbid  . apixaban (ELIQUIS) 5 MG TABS tablet Take 1 tablet (5 mg total) by mouth 2 (two) times daily.  . Calcium-Magnesium-Vitamin D (CALCIUM 1200+D3 PO) Take 1 tablet by mouth daily.  . Ferrous Sulfate (IRON) 325 (65 Fe) MG TABS Take 325 mg by mouth daily.  . furosemide (LASIX) 20 MG tablet Take 1 tablet (20 mg total) by mouth daily. In am  . metoprolol tartrate (LOPRESSOR) 25  MG tablet Take 1 tablet (25 mg total) by mouth 2 (two) times daily.  . Multiple Vitamins-Minerals (MULTIVITAMIN WITH MINERALS) tablet Take 1 tablet by mouth daily.  . vitamin B-12 (CYANOCOBALAMIN) 100 MCG tablet Take 100 mcg by mouth daily.   No Known Allergies Recent Results (from the past 2160 hour(s))  Comprehensive metabolic panel     Status: Abnormal   Collection Time: 11/02/20 10:52 AM  Result Value Ref Range   Sodium 138 135 - 145 mEq/L   Potassium 3.9 3.5 - 5.1 mEq/L   Chloride 100 96 - 112 mEq/L   CO2 29 19 - 32 mEq/L   Glucose, Bld 109 (H) 70 - 99 mg/dL   BUN 30 (H) 6 - 23 mg/dL   Creatinine, Ser 0.83 0.40 - 1.20 mg/dL   Total Bilirubin 1.2 0.2 - 1.2 mg/dL   Alkaline Phosphatase 70 39 - 117 U/L   AST 14 0 - 37 U/L   ALT 21 0 - 35 U/L   Total Protein 6.0 6.0 - 8.3 g/dL   Albumin 3.5 3.5 - 5.2 g/dL   GFR 66.60 >60.00 mL/min    Comment: Calculated using the CKD-EPI Creatinine Equation (2021)   Calcium 10.6 (H) 8.4 - 10.5 mg/dL  CBC w/Diff     Status: Abnormal   Collection Time: 11/02/20 10:52 AM  Result Value Ref Range   WBC 10.8 (H) 4.0 - 10.5 K/uL   RBC 4.87 3.87 - 5.11 Mil/uL   Hemoglobin 11.5 (L) 12.0 - 15.0 g/dL   HCT 35.3 (L) 36.0 - 46.0 %   MCV 72.4 (L) 78.0 - 100.0 fl   MCHC 32.5 30.0 - 36.0 g/dL   RDW 16.6 (H) 11.5 - 15.5 %   Platelets 146.0 (L) 150.0 - 400.0 K/uL   Neutrophils Relative % 78.9 (H) 43.0 - 77.0 %   Lymphocytes Relative 12.3 12.0 - 46.0 %   Monocytes Relative 8.3 3.0 - 12.0 %   Eosinophils Relative 0.2 0.0 - 5.0 %   Basophils Relative 0.3 0.0 - 3.0 %   Neutro Abs 8.5 (H) 1.4 - 7.7 K/uL   Lymphs Abs 1.3 0.7 - 4.0 K/uL   Monocytes Absolute 0.9 0.1 - 1.0 K/uL   Eosinophils Absolute 0.0 0.0 - 0.7 K/uL   Basophils Absolute 0.0 0.0 - 0.1 K/uL  TSH     Status: None   Collection Time: 11/02/20 10:52 AM  Result Value Ref Range   TSH 0.72 0.35 - 4.50 uIU/mL  T4, free  Status: Abnormal   Collection Time: 11/02/20 10:52 AM  Result Value Ref  Range   Free T4 1.63 (H) 0.60 - 1.60 ng/dL    Comment: Specimens from patients who are undergoing biotin therapy and /or ingesting biotin supplements may contain high levels of biotin.  The higher biotin concentration in these specimens interferes with this Free T4 assay.  Specimens that contain high levels  of biotin may cause false high results for this Free T4 assay.  Please interpret results in light of the total clinical presentation of the patient.    Protein Electrophoresis, (serum)     Status: None   Collection Time: 11/02/20 10:52 AM  Result Value Ref Range   Total Protein 6.0 6.0 - 8.5 g/dL   Albumin ELP 3.2 2.9 - 4.4 g/dL   Alpha 1 0.3 0.0 - 0.4 g/dL   Alpha 2 0.7 0.4 - 1.0 g/dL   Beta 0.9 0.7 - 1.3 g/dL   Gamma Globulin 0.8 0.4 - 1.8 g/dL   M-Spike, % Not Observed Not Observed g/dL   Globulin, Total 2.8 2.2 - 3.9 g/dL   A/G Ratio 1.1 0.7 - 1.7   Please Note: Comment     Comment: Protein electrophoresis scan will follow via computer, mail, or courier delivery.    Interpretation: Comment     Comment: The SPE pattern appears unremarkable. Evidence of monoclonal protein is not apparent.   Urine Culture     Status: None   Collection Time: 11/02/20  4:04 PM   Specimen: Urine   Urine  Result Value Ref Range   Urine Culture, Routine Final report    Organism ID, Bacteria Comment     Comment: Mixed urogenital flora 10,000-25,000 colony forming units per mL   CBC     Status: Abnormal   Collection Time: 11/06/20  9:36 AM  Result Value Ref Range   WBC 8.9 3.4 - 10.8 x10E3/uL   RBC 4.76 3.77 - 5.28 x10E6/uL   Hemoglobin 11.2 11.1 - 15.9 g/dL   Hematocrit 34.1 34.0 - 46.6 %   MCV 72 (L) 79 - 97 fL   MCH 23.5 (L) 26.6 - 33.0 pg   MCHC 32.8 31.5 - 35.7 g/dL   RDW 17.0 (H) 11.7 - 15.4 %   Platelets 188 150 - 450 [id number]/MA  Basic metabolic panel     Status: Abnormal   Collection Time: 11/06/20  9:36 AM  Result Value Ref Range   Glucose 114 (H) 65 - 99 mg/dL   BUN 27 8 - 27  mg/dL   Creatinine, Ser 0.75 0.57 - 1.00 mg/dL   eGFR 80 >59 mL/min/1.73   BUN/Creatinine Ratio 36 (H) 12 - 28   Sodium 141 134 - 144 mmol/L   Potassium 4.4 3.5 - 5.2 mmol/L   Chloride 101 96 - 106 mmol/L   CO2 19 (L) 20 - 29 mmol/L   Calcium 9.6 8.7 - 10.3 mg/dL  SARS CORONAVIRUS 2 (TAT 6-24 HRS) Nasopharyngeal Nasopharyngeal Swab     Status: None   Collection Time: 11/06/20 10:32 AM   Specimen: Nasopharyngeal Swab  Result Value Ref Range   SARS Coronavirus 2 NEGATIVE NEGATIVE    Comment: (NOTE) SARS-CoV-2 target nucleic acids are NOT DETECTED.  The SARS-CoV-2 RNA is generally detectable in upper and lower respiratory specimens during the acute phase of infection. Negative results do not preclude SARS-CoV-2 infection, do not rule out co-infections with other pathogens, and should not be used as the sole basis for treatment or  other patient management decisions. Negative results must be combined with clinical observations, patient history, and epidemiological information. The expected result is Negative.  Fact Sheet for Patients: SugarRoll.be  Fact Sheet for Healthcare Providers: https://www.woods-mathews.com/  This test is not yet approved or cleared by the [place] FDA and  has been authorized for detection and/or diagnosis of SARS-CoV-2 by FDA under an Emergency Use Authorization (EUA). This EUA will remain  in effect (meaning this test can be used) for the duration of the COVID-19 declaration under Se ction 564(b)(1) of the Act, 21 U.S.C. section 360bbb-3(b)(1), unless the authorization is terminated or revoked sooner.  Performed at [facility], [place] [address] [place], [place] [address]    Objective  Body mass index is 25.35 kg/m. Wt Readings from Last 3 Encounters:  11/16/20 143 lb 1.9 oz (64.9 kg)  11/06/20 140 lb (63.5 kg)  11/02/20 140 lb 6.4 oz (63.7 kg)   Temp Readings from Last 3 Encounters:  11/16/20  98.3 F (36.8 C) (Oral)  11/02/20 97.6 F (36.4 C) (Oral)  08/08/20 98.4 F (36.9 C) (Oral)   BP Readings from Last 3 Encounters:  11/16/20 138/70  11/07/20 92/74  11/06/20 110/62   Pulse Readings from Last 3 Encounters:  11/16/20 (!) 55  11/07/20 (!) 42  11/06/20 (!) 171    Physical Exam Vitals and nursing note reviewed.  Constitutional:      Appearance: Normal appearance. She is well-developed and well-groomed.  HENT:     Head: Normocephalic and atraumatic.  Cardiovascular:     Rate and Rhythm: Normal rate and regular rhythm.     Heart sounds: Normal heart sounds. No murmur heard.   Pulmonary:     Effort: Pulmonary effort is normal.     Breath sounds: Normal breath sounds.  Skin:    General: Skin is warm and dry.  Neurological:     General: No focal deficit present.     Mental Status: She is alert and oriented to person, place, and time. Mental status is at baseline.     Gait: Gait normal.     Comments: Walking with cane today  Psychiatric:        Attention and Perception: Attention and perception normal.        Mood and Affect: Mood and affect normal.        Speech: Speech normal.        Behavior: Behavior normal. Behavior is cooperative.        Thought Content: Thought content normal.        Cognition and Memory: Cognition and memory normal.        Judgment: Judgment normal.     Assessment  Plan  Atrial flutter, unspecified type ([place]) Resolved s/p cardioversion 11/14/20   Appetite increased with wt gain and improvement of labs  Pending CT chest ab/pelvis sx's may have been related to Atrial flutter which is now doing better and resolved pt is f/u with cards    12/28/20 thyroid US  IMPRESSION: Nodules 1 and 3 should be further evaluated with FNA.  Follow-up ultrasound should be performed in 1 year to document stability of nodule 2.  The above is in keeping with the ACR TI-RADS recommendations - J Am Coll Radiol  2017;14:587-595.   Electronically Signed   By: Miachel Roux M.D.   On: 12/28/2020 16:33 -->referred [facility]   HM Flu shot and prevnar utd and pna 23 Consider shingrix vaccine and Tdapgiven Rx 08/08/20 covid2/2 and  booster had 08/07/20  Never smoker  Pap out of age window  cologuard neg 09/2018  Mammogram negative 09/24/20  dexa 12/2018 osteopenia on calcium 1200 mg qd and vitamin D3 1000 to 2000 iu daily Never had colonoscopy  Skin Dr. Kellie Moor h/o BCCnose appt sch 12/05/20 Surgery left knee 12/12/20 Dr. Marry Guan arthritis  Provider: Dr. Olivia Mackie McLean-Scocuzza-[facility] Medicine

## 2020-11-16 NOTE — Addendum Note (Signed)
Addended by: Kavin Leech on: 11/16/2020 05:32 PM   Modules accepted: Orders

## 2020-11-16 NOTE — Addendum Note (Signed)
Addended by: PICARD-TAGNOLLI, Kailene Steinhart on: 11/16/2020 05:32 PM   Modules accepted: Orders  

## 2020-11-19 ENCOUNTER — Encounter: Payer: Self-pay | Admitting: Cardiology

## 2020-11-19 ENCOUNTER — Other Ambulatory Visit: Payer: Self-pay

## 2020-11-19 ENCOUNTER — Ambulatory Visit (INDEPENDENT_AMBULATORY_CARE_PROVIDER_SITE_OTHER): Payer: Medicare Other | Admitting: Cardiology

## 2020-11-19 ENCOUNTER — Telehealth: Payer: Self-pay | Admitting: Internal Medicine

## 2020-11-19 VITALS — BP 118/62 | HR 87 | Ht 63.0 in | Wt 138.0 lb

## 2020-11-19 DIAGNOSIS — I4892 Unspecified atrial flutter: Secondary | ICD-10-CM | POA: Diagnosis not present

## 2020-11-19 DIAGNOSIS — Z01810 Encounter for preprocedural cardiovascular examination: Secondary | ICD-10-CM | POA: Diagnosis not present

## 2020-11-19 MED ORDER — RIVAROXABAN 20 MG PO TABS: 20.0000 mg | ORAL_TABLET | Freq: Every day | ORAL | 5 refills | Status: DC

## 2020-11-19 NOTE — Progress Notes (Signed)
Cardiology Office Note:    Date:  11/19/2020   ID:  Kelli Smith, DOB September 05, 1940, MRN [id number]  PCP:  McLean-Scocuzza, Nino Glow, MD  Cardiologist:  No primary care provider on file.  Electrophysiologist:  None   Referring MD: McLean-Scocuzza, Olivia Mackie *   Chief Complaint  Patient presents with  . Other    2 week follow up. Patient c.o swelling in ankles.     History of Present Illness:    Kelli Smith is a 80 y.o. female with a hx of arthritis, paroxysmal A. fib/flutter s/p DCCV 11/07/2020 who presents for follow-up.  Previously seen due to abnormal heartbeat and atrial fibrillation.  She underwent TEE guided cardioversion on 11/07/2020 successfully.  Was started on amiodarone postprocedure.  This was subsequently decreased to 200 mg daily.  Had bradycardia, Lopressor was stopped.  Cost for Eliquis too high for patient.  Seeking alternatives.  Has no new concerns at this time.  Bilateral knee surgery being planned.   Prior notes Echocardiogram 08/9922 normal systolic function, mild LVH, EF 60 to 65%. Cardiac monitor on 09/2019 reviewed again showing paroxysmal atrial fibrillation.   Past Medical History:  Diagnosis Date  . Arthritis    knees, right shoulder   . History of chicken pox   . UTI (urinary tract infection)     Past Surgical History:  Procedure Laterality Date  . [place]  . EYE SURGERY     cataract 2013/2014 b/l   . FOOT SURGERY     bunion an dhammer toe in 2009  . TEE WITHOUT CARDIOVERSION N/A 11/07/2020   Procedure: TRANSESOPHAGEAL ECHOCARDIOGRAM (TEE);  Surgeon: Kate Sable, MD;  Location: [facility] ORS;  Service: Cardiovascular;  Laterality: N/A;    Current Medications: Current Meds  Medication Sig  . amiodarone (PACERONE) 200 MG tablet Take 1 tablet (200 mg total) by mouth daily. Take 400mg  twice daily x 1 week, then 200mg  bid  . Calcium-Magnesium-Vitamin D (CALCIUM 1200+D3 PO) Take 1 tablet by mouth daily.  . Ferrous Sulfate (IRON)  325 (65 Fe) MG TABS Take 325 mg by mouth daily.  . furosemide (LASIX) 20 MG tablet Take 1 tablet (20 mg total) by mouth daily. In am  . Multiple Vitamins-Minerals (MULTIVITAMIN WITH MINERALS) tablet Take 1 tablet by mouth daily.  . rivaroxaban (XARELTO) 20 MG TABS tablet Take 1 tablet (20 mg total) by mouth daily with supper.  . vitamin B-12 (CYANOCOBALAMIN) 100 MCG tablet Take 100 mcg by mouth daily.  . [DISCONTINUED] apixaban (ELIQUIS) 5 MG TABS tablet Take 1 tablet (5 mg total) by mouth 2 (two) times daily.     Allergies:   Patient has no known allergies.   Social History   Socioeconomic History  . Marital status: Widowed    Spouse name: Not on file  . Number of children: Not on file  . Years of education: Not on file  . Highest education level: Not on file  Occupational History  . Not on file  Tobacco Use  . Smoking status: Never Smoker  . Smokeless tobacco: Never Used  Vaping Use  . Vaping Use: Never used  Substance and Sexual Activity  . Alcohol use: Yes    Alcohol/week: 1.0 - 2.0 standard drink    Types: 1 - 2 Glasses of wine per week    Comment: occasional drinker  . Drug use: Never  . Sexual activity: Not on file  Other Topics Concern  . Not on file  Social History Narrative  From [place] lived in [place] since late 1990s early 2000    Lives with daughter    Secretary/administrator ed    Former Pharmacist, hospital    No guns, wears seat belt, safe in relationship    Widowed       2 daughters 1/2 in [employer]    Social Determinants of Health   Financial Resource Strain: Low Risk   . Difficulty of Paying Living Expenses: Not hard at all  Food Insecurity: No Food Insecurity  . Worried About Charity fundraiser in the Last Year: Never true  . Ran Out of Food in the Last Year: Never true  Transportation Needs: No Transportation Needs  . Lack of Transportation (Medical): No  . Lack of Transportation (Non-Medical): No  Physical Activity: Not on file  Stress: No Stress Concern Present  .  Feeling of Stress : Not at all  Social Connections: Unknown  . Frequency of Communication with Friends and Family: More than three times a week  . Frequency of Social Gatherings with Friends and Family: Not on file  . Attends Religious Services: Not on file  . Active Member of Clubs or Organizations: Not on file  . Attends Archivist Meetings: Not on file  . Marital Status: Not on file     Family History: The patient's family history includes Heart Problems in her mother; Heart disease in her father and mother.  ROS:   Please see the history of present illness.     All other systems reviewed and are negative.  EKGs/Labs/Other Studies Reviewed:    The following studies were reviewed today:   EKG:  EKG is  ordered today.  The ekg ordered today demonstrates sinus rhythm with occasional PAC's  Recent Labs: 11/02/2020: ALT 21; TSH 0.72 11/06/2020: BUN 27; Creatinine, Ser 0.75; Hemoglobin 11.2; Platelets 188; Potassium 4.4; Sodium 141  Recent Lipid Panel    Component Value Date/Time   CHOL 161 04/03/2020 0809   TRIG 47.0 04/03/2020 0809   HDL 68.80 04/03/2020 0809   CHOLHDL 2 04/03/2020 0809   VLDL 9.4 04/03/2020 0809   LDLCALC 83 04/03/2020 0809    Physical Exam:    VS:  BP 118/62 (BP Location: Right Arm, Patient Position: Sitting, Cuff Size: Normal)   Pulse 87   Ht 5\' 3"  (1.6 m)   Wt 138 lb (62.6 kg)   SpO2 96%   BMI 24.45 kg/m     Wt Readings from Last 3 Encounters:  11/19/20 138 lb (62.6 kg)  11/16/20 143 lb 1.9 oz (64.9 kg)  11/06/20 140 lb (63.5 kg)     GEN:  Well nourished, well developed in no acute distress HEENT: Normal NECK: No JVD; No carotid bruits LYMPHATICS: No lymphadenopathy CARDIAC: Irregular irregular, tachycardic, no murmurs RESPIRATORY:  Clear to auscultation without rales, wheezing or rhonchi  ABDOMEN: Soft, non-tender, non-distended MUSCULOSKELETAL:  No edema; No deformity  SKIN: Warm and dry NEUROLOGIC:  Alert and oriented x  3 PSYCHIATRIC:  Normal affect   ASSESSMENT:    1. Atrial flutter, unspecified type ([place])   2. Preop cardiovascular exam    PLAN:    In order of problems listed above:  1. Atrial flutter status post DC cardioversion.  CHADS2 vascular score of 3.  Continue amiodarone, stop Eliquis due to cost issues, start Xarelto.  If this is too expensive, will plan to start Coumadin. 2. Preop eval, okay to proceed with surgical intervention from a cardiac perspective.  Arrhythmias not controlled on amiodarone.  Hold Eliquis/anticoagulation for 48 hours prior to procedure.  Follow-up in 6 months.  Total encounter time 30 minutes  Greater than 50% was spent in counseling and coordination of care with the patient   Medication Adjustments/Labs and Tests Ordered: Current medicines are reviewed at length with the patient today.  Concerns regarding medicines are outlined above.  Orders Placed This Encounter  Procedures  . EKG 12-Lead   Meds ordered this encounter  Medications  . rivaroxaban (XARELTO) 20 MG TABS tablet    Sig: Take 1 tablet (20 mg total) by mouth daily with supper.    Dispense:  30 tablet    Refill:  5    Patient Instructions  Medication Instructions:   Your physician has recommended you make the following change in your medication:   1.  START Xarelto 20 MG once a day.  2.  STOP taking your Eliquis.  Lab Work: None ordered If you have labs (blood work) drawn today and your tests are completely normal, you will receive your results only by: Marland Kitchen MyChart Message (if you have MyChart) OR . A paper copy in the mail If you have any lab test that is abnormal or we need to change your treatment, we will call you to review the results.   Testing/Procedures: None ordered   Follow-Up: At [facility], you and your health needs are our priority.  As part of our continuing mission to provide you with exceptional heart care, we have created designated Provider Care Teams.  These  Care Teams include your primary Cardiologist (physician) and Advanced Practice Providers (APPs -  Physician Assistants and Nurse Practitioners) who all work together to provide you with the care you need, when you need it.  We recommend signing up for the patient portal called "MyChart".  Sign up information is provided on this After Visit Summary.  MyChart is used to connect with patients for Virtual Visits (Telemedicine).  Patients are able to view lab/test results, encounter notes, upcoming appointments, etc.  Non-urgent messages can be sent to your provider as well.   To learn more about what you can do with MyChart, go to [email or web address].    Your next appointment:   6 month(s)  The format for your next appointment:   In Person  Provider:   Kate Sable, MD   Other Instructions      Signed, Kate Sable, MD  11/19/2020 5:24 PM    [place]

## 2020-11-19 NOTE — Patient Instructions (Signed)
Medication Instructions:   Your physician has recommended you make the following change in your medication:   1.  START Xarelto 20 MG once a day.  2.  STOP taking your Eliquis.  Lab Work: None ordered If you have labs (blood work) drawn today and your tests are completely normal, you will receive your results only by: Marland Kitchen MyChart Message (if you have MyChart) OR . A paper copy in the mail If you have any lab test that is abnormal or we need to change your treatment, we will call you to review the results.   Testing/Procedures: None ordered   Follow-Up: At [facility], you and your health needs are our priority.  As part of our continuing mission to provide you with exceptional heart care, we have created designated Provider Care Teams.  These Care Teams include your primary Cardiologist (physician) and Advanced Practice Providers (APPs -  Physician Assistants and Nurse Practitioners) who all work together to provide you with the care you need, when you need it.  We recommend signing up for the patient portal called "MyChart".  Sign up information is provided on this After Visit Summary.  MyChart is used to connect with patients for Virtual Visits (Telemedicine).  Patients are able to view lab/test results, encounter notes, upcoming appointments, etc.  Non-urgent messages can be sent to your provider as well.   To learn more about what you can do with MyChart, go to [email or web address].    Your next appointment:   6 month(s)  The format for your next appointment:   In Person  Provider:   Kate Sable, MD   Other Instructions

## 2020-11-19 NOTE — Telephone Encounter (Signed)
Kate Sable, MD  McLean-Scocuzza, Nino Glow, MD Cc: Kavin Leech, RN [place],   Please advise patient to stop Lopressor due to bradycardia.   Reduce amiodarone to 200 mg daily.   Keep follow-up appointment with myself.    Per [facility] cardiology

## 2020-11-21 ENCOUNTER — Ambulatory Visit
Admission: RE | Admit: 2020-11-21 | Discharge: 2020-11-21 | Disposition: A | Discharge status: Home / Self Care | Payer: Medicare Other | Source: Ambulatory Visit | Attending: Internal Medicine | Admitting: Internal Medicine

## 2020-11-21 ENCOUNTER — Other Ambulatory Visit: Payer: Self-pay

## 2020-11-21 DIAGNOSIS — M4319 Spondylolisthesis, multiple sites in spine: Secondary | ICD-10-CM | POA: Diagnosis not present

## 2020-11-21 DIAGNOSIS — N281 Cyst of kidney, acquired: Secondary | ICD-10-CM | POA: Diagnosis not present

## 2020-11-21 DIAGNOSIS — I313 Pericardial effusion (noninflammatory): Secondary | ICD-10-CM | POA: Insufficient documentation

## 2020-11-21 DIAGNOSIS — R630 Anorexia: Secondary | ICD-10-CM | POA: Diagnosis not present

## 2020-11-21 DIAGNOSIS — I2510 Atherosclerotic heart disease of native coronary artery without angina pectoris: Secondary | ICD-10-CM | POA: Diagnosis not present

## 2020-11-21 DIAGNOSIS — R799 Abnormal finding of blood chemistry, unspecified: Secondary | ICD-10-CM | POA: Diagnosis not present

## 2020-11-21 DIAGNOSIS — I3139 Other pericardial effusion (noninflammatory): Secondary | ICD-10-CM

## 2020-11-21 DIAGNOSIS — I517 Cardiomegaly: Secondary | ICD-10-CM | POA: Diagnosis not present

## 2020-11-21 DIAGNOSIS — J90 Pleural effusion, not elsewhere classified: Secondary | ICD-10-CM | POA: Diagnosis not present

## 2020-11-21 DIAGNOSIS — J9859 Other diseases of mediastinum, not elsewhere classified: Secondary | ICD-10-CM | POA: Diagnosis not present

## 2020-11-21 DIAGNOSIS — R634 Abnormal weight loss: Secondary | ICD-10-CM | POA: Diagnosis not present

## 2020-11-21 DIAGNOSIS — R1033 Periumbilical pain: Secondary | ICD-10-CM | POA: Insufficient documentation

## 2020-11-21 DIAGNOSIS — R5383 Other fatigue: Secondary | ICD-10-CM | POA: Diagnosis not present

## 2020-11-21 MED ORDER — IOHEXOL 300 MG/ML  SOLN
75.0000 mL | Freq: Once | INTRAMUSCULAR | Status: AC | PRN
  Administered 2020-11-21: 75 mL via INTRAVENOUS

## 2020-11-22 ENCOUNTER — Encounter: Payer: Self-pay | Admitting: Internal Medicine

## 2020-11-22 DIAGNOSIS — I700 Atherosclerosis of aorta: Secondary | ICD-10-CM | POA: Insufficient documentation

## 2020-11-22 DIAGNOSIS — M47816 Spondylosis without myelopathy or radiculopathy, lumbar region: Secondary | ICD-10-CM | POA: Insufficient documentation

## 2020-11-22 DIAGNOSIS — D3502 Benign neoplasm of left adrenal gland: Secondary | ICD-10-CM | POA: Insufficient documentation

## 2020-11-22 DIAGNOSIS — I2510 Atherosclerotic heart disease of native coronary artery without angina pectoris: Secondary | ICD-10-CM | POA: Insufficient documentation

## 2020-11-22 DIAGNOSIS — N281 Cyst of kidney, acquired: Secondary | ICD-10-CM | POA: Insufficient documentation

## 2020-11-22 HISTORY — DX: Atherosclerotic heart disease of native coronary artery without angina pectoris: I25.10

## 2020-11-22 HISTORY — DX: Benign neoplasm of left adrenal gland: D35.02

## 2020-11-22 HISTORY — DX: Spondylosis without myelopathy or radiculopathy, lumbar region: M47.816

## 2020-11-23 ENCOUNTER — Telehealth: Payer: Self-pay | Admitting: Internal Medicine

## 2020-11-23 NOTE — Telephone Encounter (Signed)
Please cancel 02/04/21 labs can be fasting for labs 02/06/21 am appt let daughter know  Kelli Smith  Thanks

## 2020-11-25 NOTE — Discharge Instructions (Signed)
Instructions after Total Knee Replacement   Aneliz Carbary P. Lui Bellis, Jr., M.D.     Dept. of Orthopaedics & Sports Medicine  [facility]  [address]  [place], [place]  [address]  Phone: [phone]   Fax: [phone]    DIET: Drink plenty of non-alcoholic fluids. Resume your normal diet. Include foods high in fiber.  ACTIVITY:  You may use crutches or a walker with weight-bearing as tolerated, unless instructed otherwise. You may be weaned off of the walker or crutches by your Physical Therapist.  Do NOT place pillows under the knee. Anything placed under the knee could limit your ability to straighten the knee.   Continue doing gentle exercises. Exercising will reduce the pain and swelling, increase motion, and prevent muscle weakness.   Please continue to use the TED compression stockings for 6 weeks. You may remove the stockings at night, but should reapply them in the morning. Do not drive or operate any equipment until instructed.  WOUND CARE:  Continue to use the PolarCare or ice packs periodically to reduce pain and swelling. You may bathe or shower after the staples are removed at the first office visit following surgery.  MEDICATIONS: You may resume your regular medications. Please take the pain medication as prescribed on the medication. Do not take pain medication on an empty stomach. You have been given a prescription for a blood thinner (Lovenox or Coumadin). Please take the medication as instructed. (NOTE: After completing a 2 week course of Lovenox, take one Enteric-coated aspirin once a day. This along with elevation will help reduce the possibility of phlebitis in your operated leg.) Do not drive or drink alcoholic beverages when taking pain medications.  CALL THE OFFICE FOR: Temperature above 101 degrees Excessive bleeding or drainage on the dressing. Excessive swelling, coldness, or paleness of the toes. Persistent nausea and vomiting.  FOLLOW-UP:  You  should have an appointment to return to the office in 10-14 days after surgery. Arrangements have been made for continuation of Physical Therapy (either home therapy or outpatient therapy).   [facility] Department Directory         [email or web address]       [email or web address]          Cardiology  Appointments: [place] - [phone] [place] - [phone]  Endocrinology  Appointments: [place] - [phone] [place] - [phone]  Gastroenterology  Appointments: [place] - [phone] [place] - [phone]        General Surgery   Appointments: [place] - [phone]  Internal Medicine/Family Medicine  Appointments: [place] - [phone] [place] - [phone] [place] - [phone]  Metabolic and Weigh Loss Surgery  Appointments: [place] - [phone]        Neurology  Appointments: [place] - [phone] [place] - [phone]  Neurosurgery  Appointments: [place] - [phone]  Obstetrics & Gynecology  Appointments: [place] - [phone] [place] - [phone]        Pediatrics  Appointments: [place] - [phone] [place] - [phone]  Physiatry  Appointments: [place] -[phone]  Physical Therapy  Appointments: [place] - [phone] [place] - [phone]        Podiatry  Appointments: [place] - [phone] [place] - [phone]  Pulmonology  Appointments: [place] - [phone]  Rheumatology  Appointments: [place] - [phone]        [place] Location: [facility]  [address] [place], [place]  [address]  [place] Location: [facility] [address] [place], [place]  [address]  [place] Location: [facility] [address] [place], [place]  [address]    

## 2020-11-27 ENCOUNTER — Telehealth: Payer: Self-pay | Admitting: Cardiology

## 2020-11-27 NOTE — Telephone Encounter (Signed)
Without part D coverage, she will probably meet the requirements for patient assistance. However, she will only be covered until the end of the year, then they will require that she obtain a part D plan.

## 2020-11-27 NOTE — Telephone Encounter (Signed)
**Note De-Identified  Obfuscation** The pt sees Dr Kate Sable in our [place] office. I will forward this note to their triage pool so they can reach out to thept or forward to Dr Vennie Homans nurse to handle.

## 2020-11-27 NOTE — Telephone Encounter (Signed)
Patients daughter stated that the patient had a CT of the chest on 11/21/20 that was ordered by her PCP. There were some cardiac findings (Copied and pasted below) that she would like Dr. Garen Lah to review, and wanted to know if patient should be seen prior to her follow up in 6 months.  Cardiovascular: Small to moderate pericardial effusion. There is cardiomegaly. Calcific aortic and coronary atherosclerosis is noted. There is some calcification of the mitral valve.  Will route to Dr. Garen Lah for review.

## 2020-11-27 NOTE — Telephone Encounter (Signed)
Patient daughter returning call  °

## 2020-11-27 NOTE — Telephone Encounter (Signed)
Left a VM for patients daughter to call me back.

## 2020-11-27 NOTE — Telephone Encounter (Signed)
Spoke with patients daughter and she is filling out the patient assistance form for Xarelto. She will bring it in to the office in the next day or two for [place] to sign and fax through for her. She has 1 week of Eliquis left, then she will be switching to Xarelto. I left 4 sample bottles for her to pick up at the front desk while we work on the PAF.  Samples of this drug (Xarelto 20 MG) were given to the patient, quantity 4 bottles, Lot Number [id number] exp 4/24

## 2020-11-27 NOTE — Telephone Encounter (Signed)
Patients daughter calling in regarding her mothers Xarelto. Patient does not have pharmacy coverage and is now wanting to know her assistance options and what to do in the mean time. Patient has found out about the [place] [employer] [place] application but would like to speak with the nurse about the best option

## 2020-11-28 ENCOUNTER — Encounter: Payer: Self-pay | Admitting: Internal Medicine

## 2020-11-28 ENCOUNTER — Other Ambulatory Visit: Payer: Self-pay | Admitting: Internal Medicine

## 2020-11-28 DIAGNOSIS — E010 Iodine-deficiency related diffuse (endemic) goiter: Secondary | ICD-10-CM

## 2020-11-29 ENCOUNTER — Telehealth: Payer: Self-pay | Admitting: Internal Medicine

## 2020-11-29 NOTE — Telephone Encounter (Signed)
Patient's daughter informed and verbalized understanding.  Copy sent to scan and placed upfront for daughter to pick up

## 2020-11-29 NOTE — Telephone Encounter (Signed)
lft vm for pt to call ofc to sch US thyroid. thanks

## 2020-11-29 NOTE — Telephone Encounter (Signed)
Called patients daughter and informed her of the following recommendation as seen below, I also informed her that I received the Paperwork for the [employer] patient assistance form and will be faxing the completed forms today.Kate Sable, MD  You 20 hours ago (5:18 PM)   Calcifications in the coronary arteries and mitral valve noted. This is not clinically significant to warrant earlier follow-up. Findings are not clinically significant, calcifications in the coronary arteries and valves are fairly common in the elderly. Keep follow-up as previously scheduled.   Routing comment

## 2020-11-30 ENCOUNTER — Encounter
Admission: RE | Admit: 2020-11-30 | Discharge: 2020-11-30 | Disposition: A | Discharge status: Home / Self Care | Payer: Medicare Other | Source: Ambulatory Visit | Attending: Orthopedic Surgery | Admitting: Orthopedic Surgery

## 2020-11-30 ENCOUNTER — Other Ambulatory Visit: Payer: Self-pay

## 2020-11-30 DIAGNOSIS — Z01812 Encounter for preprocedural laboratory examination: Secondary | ICD-10-CM | POA: Insufficient documentation

## 2020-11-30 HISTORY — DX: Personal history of urinary calculi: Z87.442

## 2020-11-30 HISTORY — DX: Anemia, unspecified: D64.9

## 2020-11-30 HISTORY — DX: Unspecified atrial fibrillation: I48.91

## 2020-11-30 HISTORY — DX: Atrial premature depolarization: I49.1

## 2020-11-30 LAB — URINALYSIS, ROUTINE W REFLEX MICROSCOPIC
Bilirubin Urine: NEGATIVE
Glucose, UA: NEGATIVE mg/dL
Ketones, ur: NEGATIVE mg/dL
Leukocytes,Ua: NEGATIVE
Nitrite: NEGATIVE
Protein, ur: NEGATIVE mg/dL
Specific Gravity, Urine: 1.013 (ref 1.005–1.030)
pH: 7 (ref 5.0–8.0)

## 2020-11-30 LAB — COMPREHENSIVE METABOLIC PANEL
ALT: 22 U/L (ref 0–44)
AST: 24 U/L (ref 15–41)
Albumin: 3.7 g/dL (ref 3.5–5.0)
Alkaline Phosphatase: 58 U/L (ref 38–126)
Anion gap: 8 (ref 5–15)
BUN: 17 mg/dL (ref 8–23)
CO2: 29 mmol/L (ref 22–32)
Calcium: 9.4 mg/dL (ref 8.9–10.3)
Chloride: 100 mmol/L (ref 98–111)
Creatinine, Ser: 0.7 mg/dL (ref 0.44–1.00)
GFR, Estimated: >60 mL/min (ref >60)
Glucose, Bld: 102 mg/dL — ABNORMAL HIGH (ref 70–99)
Potassium: 3.3 mmol/L — ABNORMAL LOW (ref 3.5–5.1)
Sodium: 137 mmol/L (ref 135–145)
Total Bilirubin: 0.8 mg/dL (ref 0.3–1.2)
Total Protein: 6.7 g/dL (ref 6.5–8.1)

## 2020-11-30 LAB — CBC
HCT: 36.1 % (ref 36.0–46.0)
Hemoglobin: 11.4 g/dL — ABNORMAL LOW (ref 12.0–15.0)
MCH: 23.6 pg — ABNORMAL LOW (ref 26.0–34.0)
MCHC: 31.6 g/dL (ref 30.0–36.0)
MCV: 74.6 fL — ABNORMAL LOW (ref 80.0–100.0)
Platelets: 222 10*3/uL (ref 150–400)
RBC: 4.84 MIL/uL (ref 3.87–5.11)
RDW: 16 % — ABNORMAL HIGH (ref 11.5–15.5)
WBC: 8.5 10*3/uL (ref 4.0–10.5)
nRBC: 0 % (ref 0.0–0.2)

## 2020-11-30 LAB — SURGICAL PCR SCREEN
MRSA, PCR: NEGATIVE
Staphylococcus aureus: NEGATIVE

## 2020-11-30 LAB — C-REACTIVE PROTEIN: CRP: 0.6 mg/dL (ref <1.0)

## 2020-11-30 LAB — PROTIME-INR
INR: 1.4 — ABNORMAL HIGH (ref 0.8–1.2)
Prothrombin Time: 16.9 seconds — ABNORMAL HIGH (ref 11.4–15.2)

## 2020-11-30 LAB — SEDIMENTATION RATE: Sed Rate: 14 mm/hr (ref 0–30)

## 2020-11-30 LAB — APTT: aPTT: 29 seconds (ref 24–36)

## 2020-11-30 NOTE — Patient Instructions (Addendum)
Your procedure is scheduled on:12-12-20 WEDNESDAY Report to the Registration Desk on the 1st floor of the [facility]-Then proceed to the 2nd floor Surgery Desk in the [facility] To find out your arrival time, please call [phone] between 1PM - 3PM on:12-11-20 TUESDAY  REMEMBER: Instructions that are not followed completely may result in serious medical risk, up to and including death; or upon the discretion of your surgeon and anesthesiologist your surgery may need to be rescheduled.  Do not eat food after midnight the night before surgery.  No gum chewing, lozengers or hard candies.  You may however, drink CLEAR liquids up to 2 hours before you are scheduled to arrive for your surgery. Do not drink anything within 2 hours of your scheduled arrival time.  Clear liquids include: - water  - apple juice without pulp - gatorade  - black coffee or tea (Do NOT add milk or creamers to the coffee or tea) Do NOT drink anything that is not on this list.  In addition, your doctor has ordered for you to drink the provided  Ensure Pre-Surgery Clear Carbohydrate Drink  Drinking this carbohydrate drink up to two hours before surgery helps to reduce insulin resistance and improve patient outcomes. Please complete drinking 2 hours prior to scheduled arrival time.  TAKE THESE MEDICATIONS THE MORNING OF SURGERY WITH A SIP OF WATER: -AMIODARONE (PACERONE)  Follow recommendations from Cardiologist, Pulmonologist or PCP regarding stopping Aspirin, Coumadin, Plavix, Eliquis, Pradaxa, or Pletal-DR AGBOR-ETANG INSTRUCTIONS FOR XARELTO TO BE STOPPED 48 HOURS PRIOR TO SURGERY-CALL DR HOOTEN'S OFFICE TO CONFIRM THAT 48 HOURS IS THE CORRECT TIME FOR XARELTO TO BE HELD   One week prior to surgery: Stop Anti-inflammatories (NSAIDS) such as Advil, Aleve, Ibuprofen, Motrin, Naproxen, Naprosyn and Aspirin based products such as Excedrin, Goodys Powder, BC Powder-OK TO TAKE TYLENOL IF NEEDED  Stop ANY OVER THE  COUNTER supplements/vitamins until after surgery-LAST DOSE OF MULTIVITAMIN, B COMPLEX, VITAMIN D3, FISH OIL, COGNIUM MEMORYAND CALCIUM-VITAMIN D3 ON 12-04-20 TUESDAY  No Alcohol for 24 hours before or after surgery.  No Smoking including e-cigarettes for 24 hours prior to surgery.  No chewable tobacco products for at least 6 hours prior to surgery.  No nicotine patches on the day of surgery.  Do not use any "recreational" drugs for at least a week prior to your surgery.  Please be advised that the combination of cocaine and anesthesia may have negative outcomes, up to and including death. If you test positive for cocaine, your surgery will be cancelled.  On the morning of surgery brush your teeth with toothpaste and water, you may rinse your mouth with mouthwash if you wish. Do not swallow any toothpaste or mouthwash.  Do not wear jewelry, make-up, hairpins, clips or nail polish.  Do not wear lotions, powders, or perfumes.   Do not shave body from the neck down 48 hours prior to surgery just in case you cut yourself which could leave a site for infection.  Also, freshly shaved skin may become irritated if using the CHG soap.  Contact lenses, hearing aids and dentures may not be worn into surgery.  Do not bring valuables to the hospital. [facility] is not responsible for any missing/lost belongings or valuables.   Use CHG Soap as directed on instruction sheet.  Notify your doctor if there is any change in your medical condition (cold, fever, infection).  Wear comfortable clothing (specific to your surgery type) to the hospital.  Plan for stool softeners for  home use; pain medications have a tendency to cause constipation. You can also help prevent constipation by eating foods high in fiber such as fruits and vegetables and drinking plenty of fluids as your diet allows.  After surgery, you can help prevent lung complications by doing breathing exercises.  Take deep breaths and cough  every 1-2 hours. Your doctor may order a device called an Incentive Spirometer to help you take deep breaths. When coughing or sneezing, hold a pillow firmly against your incision with both hands. This is called "splinting." Doing this helps protect your incision. It also decreases belly discomfort.  If you are being admitted to the hospital overnight, leave your suitcase in the car. After surgery it may be brought to your room.  If you are being discharged the day of surgery, you will not be allowed to drive home. You will need a responsible adult (18 years or older) to drive you home and stay with you that night.   If you are taking public transportation, you will need to have a responsible adult (18 years or older) with you. Please confirm with your physician that it is acceptable to use public transportation.   Please call the [place] Dept. at [phone] if you have any questions about these instructions.  Surgery Visitation Policy:  Patients undergoing a surgery or procedure may have one family member or support person with them as long as that person is not COVID-19 positive or experiencing its symptoms.  That person may remain in the waiting area during the procedure.  Inpatient Visitation:    Visiting hours are 7 a.m. to 8 p.m. Inpatients will be allowed two visitors daily. The visitors may change each day during the patient's stay. No visitors under the age of 74. Any visitor under the age of 88 must be accompanied by an adult. The visitor must pass COVID-19 screenings, use hand sanitizer when entering and exiting the patient's room and wear a mask at all times, including in the patient's room. Patients must also wear a mask when staff or their visitor are in the room. Masking is required regardless of vaccination status.

## 2020-11-30 NOTE — Telephone Encounter (Signed)
Pt has been approved for [employer] patient assistance program.  11/29/2020-11/29/2021

## 2020-12-02 ENCOUNTER — Encounter: Payer: Self-pay | Admitting: Internal Medicine

## 2020-12-02 LAB — URINE CULTURE: Special Requests: NORMAL

## 2020-12-05 DIAGNOSIS — D2271 Melanocytic nevi of right lower limb, including hip: Secondary | ICD-10-CM | POA: Diagnosis not present

## 2020-12-05 DIAGNOSIS — L570 Actinic keratosis: Secondary | ICD-10-CM | POA: Diagnosis not present

## 2020-12-05 DIAGNOSIS — D225 Melanocytic nevi of trunk: Secondary | ICD-10-CM | POA: Diagnosis not present

## 2020-12-05 DIAGNOSIS — X32XXXA Exposure to sunlight, initial encounter: Secondary | ICD-10-CM | POA: Diagnosis not present

## 2020-12-05 DIAGNOSIS — Z85828 Personal history of other malignant neoplasm of skin: Secondary | ICD-10-CM | POA: Diagnosis not present

## 2020-12-05 DIAGNOSIS — D2262 Melanocytic nevi of left upper limb, including shoulder: Secondary | ICD-10-CM | POA: Diagnosis not present

## 2020-12-05 DIAGNOSIS — D2261 Melanocytic nevi of right upper limb, including shoulder: Secondary | ICD-10-CM | POA: Diagnosis not present

## 2020-12-06 ENCOUNTER — Encounter: Payer: Self-pay | Admitting: Internal Medicine

## 2020-12-06 DIAGNOSIS — M25562 Pain in left knee: Secondary | ICD-10-CM | POA: Diagnosis not present

## 2020-12-09 NOTE — H&P (Signed)
ORTHOPAEDIC HISTORY & PHYSICAL Gwenlyn Fudge, [place] - 12/06/2020 2:45 PM EDT Formatting of this note is different from the original. [place] [facility] Chief Complaint:   Chief Complaint  Patient presents with  . Knee Pain  H & P LEFT KNEE   History of Present Illness:   Kelli Smith is a 80 y.o. female that presents to clinic today for her preoperative history and evaluation. The patient is scheduled to undergo a left total knee arthroplasty on 12/12/20 by Dr. Marry Guan. Her pain began more than 10 years ago. The pain is located along the medial aspect of the knee. She describes her pain as worse with weightbearing. She reports associated swelling, some locking, and some giving way of the knee. She denies associated numbness or tingling.   The patient's symptoms have progressed to the point that they decrease her quality of life. The patient has previously undergone conservative treatment including NSAIDS and injections to the knee without adequate control of her symptoms.  Patient denies significant cardiac history, history of blood clots, or prior lumbar surgery with retained hardware.  Past Medical, Surgical, Family, Social History, Allergies, Medications:   Past Medical History:  Past Medical History:  Diagnosis Date  . Atrial fibrillation (CMS-HCC)  . Chicken pox  . DDD (degenerative disc disease), lumbar 04/05/2020  . Knee pain, bilateral 06/11/2018  . Lumbar radiculopathy 04/05/2020  . Memory loss  . Osteoarthritis  . Osteopenia   Past Surgical History:  Past Surgical History:  Procedure Laterality Date  . CESAREAN SECTION  . FOOT SURGERY 2009  BUNION AND HAMMER TOE  . LENS EYE SURGERY Bilateral 20136/2014  CATARACT SURGERY   Current Medications:  Current Outpatient Medications  Medication Sig Dispense Refill  . AMIOdarone (PACERONE) 200 MG tablet Take 200 mg by mouth once daily  . Ca-D3-mag ox-zinc-cop-mang-bor 600 mg calcium-  20 mcg-50 mg Tab 1 tablet once daily  . cholecalciferol, vitamin D3, (CHOLECALCIFEROL, VIT D3,,BULK, MISC) Take 1 tablet by mouth once daily  . ferrous sulfate 325 (65 FE) MG tablet 325 mg daily with breakfast  . FUROsemide (LASIX) 20 MG tablet TAKE 1 TABLET (20 MG TOTAL) BY MOUTH DAILY. IN AM  . metoprolol tartrate (LOPRESSOR) 25 MG tablet Take 25 mg by mouth 2 (two) times daily  . multivitamin with minerals tablet Take 1 tablet by mouth once daily  . omega 3-dha-epa-fish oil 1,200 (144-216) mg Cap Take 1 capsule by mouth once daily  . rivaroxaban (XARELTO) 20 mg tablet Take 20 mg by mouth daily with dinner  . vitamin B complex (B COMPLEX 1 ORAL) Take 1 tablet by mouth once daily   No current facility-administered medications for this visit.   Allergies: No Known Allergies  Social History:  Social History   Socioeconomic History  . Marital status: Widowed  . Number of children: 2  . Years of education: 16  Occupational History  . Occupation: RetiredGames developer  Tobacco Use  . Smoking status: Never Smoker  . Smokeless tobacco: Never Used  Substance and Sexual Activity  . Alcohol use: Yes  . Drug use: Never  . Sexual activity: Defer  Partners: Male   Family History:  Family History  Problem Relation Age of Onset  . Heart disease Mother  . Heart disease Father   Review of Systems:   A 10+ ROS was performed, reviewed, and the pertinent orthopaedic findings are documented in the HPI.   Physical Examination:   BP 130/78 (BP Location:  Left upper arm, Patient Position: Sitting, BP Cuff Size: Adult)  Ht 160 cm (5\' 3" )  Wt 60.6 kg (133 lb 9.6 oz)  BMI 23.67 kg/m   Patient is a well-developed, well-nourished female in no acute distress. Patient has normal mood and affect. Patient is alert and oriented to person, place, and time.   HEENT: Atraumatic, normocephalic. Pupils equal and reactive to light. Extraocular motion intact. Noninjected sclera.  Cardiovascular: Regular rate  and rhythm, with no murmurs, rubs, or gallops. Distal pulses palpable.  Respiratory: Lungs clear to auscultation bilaterally.   Left Knee: Soft tissue swelling: minimal Effusion: none Erythema: none Crepitance: mild Tenderness: medial Alignment: relative varus Mediolateral laxity: medial pseudolaxity Posterior sag: negative Patellar tracking: Good tracking without evidence of subluxation or tilt Atrophy: No significant atrophy.  Quadriceps tone was fair to good. Range of motion: 0/20/125 degrees   Sensation intact over the saphenous, lateral sural cutaneous, superficial fibular, and deep fibular nerve distributions.  Tests Performed/Reviewed:  X-rays  3 views of the left knee were obtained. Images reveal severe loss of medial compartment joint space with bone on bone contact and osteophyte formation. No fractures or dislocations. No other osseous abnormality noted.  I personally ordered and interpreted today's xrays.  Impression:   ICD-10-CM  1. Primary osteoarthritis of left knee M17.12   Plan:   The patient has end-stage degenerative changes of the left knee. It was explained to the patient that the condition is progressive in nature. Having failed conservative treatment, the patient has elected to proceed with a total joint arthroplasty. The patient will undergo a total joint arthroplasty with Dr. Marry Guan. The risks of surgery, including blood clot and infection, were discussed with the patient. Measures to reduce these risks, including the use of anticoagulation, perioperative antibiotics, and early ambulation were discussed. The importance of postoperative physical therapy was discussed with the patient. The patient elects to proceed with surgery. The patient is instructed to stop all blood thinners prior to surgery. The patient is instructed to call the hospital the day before surgery to learn of the proper arrival time.   Contact our office with any questions or concerns.  Follow up as indicated, or sooner should any new problems arise, if conditions worsen, or if they are otherwise concerned.   Gwenlyn Fudge, [facility] [place], [place] [address] Phone: [phone]  This note was generated in part with voice recognition software and I apologize for any typographical errors that were not detected and corrected.  Electronically signed by Gwenlyn Fudge, PA at 12/09/2020 8:57 PM EDT

## 2020-12-10 ENCOUNTER — Other Ambulatory Visit
Admission: RE | Admit: 2020-12-10 | Discharge: 2020-12-10 | Disposition: A | Discharge status: Home / Self Care | Payer: Medicare Other | Source: Ambulatory Visit | Attending: Orthopedic Surgery | Admitting: Orthopedic Surgery

## 2020-12-10 ENCOUNTER — Other Ambulatory Visit: Payer: Self-pay

## 2020-12-10 DIAGNOSIS — Z01812 Encounter for preprocedural laboratory examination: Secondary | ICD-10-CM | POA: Insufficient documentation

## 2020-12-10 DIAGNOSIS — Z20822 Contact with and (suspected) exposure to covid-19: Secondary | ICD-10-CM | POA: Insufficient documentation

## 2020-12-10 LAB — SARS CORONAVIRUS 2 (TAT 6-24 HRS): SARS Coronavirus 2: NEGATIVE

## 2020-12-11 ENCOUNTER — Encounter: Payer: Self-pay | Admitting: Orthopedic Surgery

## 2020-12-11 LAB — TYPE AND SCREEN
ABO/RH(D): A POS
Antibody Screen: NEGATIVE

## 2020-12-11 NOTE — Progress Notes (Signed)
Perioperative Services  Pre-Admission/Anesthesia Testing Clinical Review  Date: 12/11/20  Patient Demographics:  Name: Kelli Smith DOB:   06-Mar-1941 MRN:   [id number]  Planned Surgical Procedure(s):    Case: [id number] Date/Time: 12/12/20 1222   Procedure: COMPUTER ASSISTED TOTAL KNEE ARTHROPLASTY (Left Knee)   Anesthesia type: Choice   Pre-op diagnosis: PRIMARY OSTEOARTHRITIS OF LEFT KNEE.   Location: [facility] OR ROOM 01 / [place] [facility] FOR ANESTHESIA GROUP   Surgeons: Dereck Leep, MD    NOTE: Available PAT nursing documentation and vital signs have been reviewed. Clinical nursing staff has updated patient's PMH/PSHx, current medication list, and drug allergies/intolerances to ensure comprehensive history available to assist in medical decision making as it pertains to the aforementioned surgical procedure and anticipated anesthetic course.   Clinical Discussion:  Kelli Smith is a 80 y.o. female who is submitted for pre-surgical anesthesia review and clearance prior to her undergoing the above procedure. Patient has never been a smoker. Pertinent PMH includes: CAD, aortic atherosclerosis, atrial fibrillation, anemia, OA, lumbar DDD.  Patient is followed by cardiology Garen Lah, MD). She was last seen in the cardiology clinic on 11/19/2020; notes reviewed.  At the time of her clinic visit, patient doing well overall from a cardiovascular perspective.  She denied any episodes of chest pain, shortness breath, PND, orthopnea, significant palpitations, peripheral edema, vertiginous symptoms, or presyncope/syncope.  PMH significant for paroxysmal atrial fibrillation/flutter. CHA2DS2-VASc Score = 4 (age x 2, sex, aortic plaque). Patient chronically anticoagulated using rivaroxaban; compliant with therapy with no evidence of GI bleeding.  Long-term cardiac event monitor study performed on 10/11/2019 revealed a predominantly underlying sinus rhythm with frequent atrial tachycardia and SVT runs.   Patient underwent DCCV procedure on 11/07/2020 restoring NSR.  Postoperative TEE performed on 11/07/2020 revealed low normal left ventricular systolic function with an EF of 50%.   she was started on oral antiarrhythmic (amiodarone) post procedurally; 400 mg twice daily x 1 week, then decrease to 200 mg twice daily thereafter (see full interpretation of cardiovascular testing below).  Blood pressure well controlled at 118/62.  Patient not on lipid-lowering therapy for risk factor modification. Functional capacity, as defined by DASI, is documented as being >/= 4 METS.  No changes were made to patient's medication regimen.  Patient to follow-up with outpatient cardiology in 6 months or sooner if needed.  Patient is scheduled for an elective knee arthroplasty on 12/12/2020 with Dr. Skip Estimable.  Given patient's past medical history significant for cardiovascular diagnoses, presurgical cardiac clearance was sought by the performing surgeon's office and PAT team.  Per cardiology, "arrhythmias not controlled on amiodarone.  Preop evaluation was completed and it is okay to proceed with surgical intervention from a cardiac perspective without further cardiovascular testing at this time".  Again, this patient is on daily anticoagulation therapy. She has been instructed on recommendations for holding her rivaroxaban for 2 days prior to her procedure with plans to restart as soon as postoperative bleeding risk felt to be minimized by her attending surgeon. The patient has been instructed that her last dose of her anticoagulant will be on 12/09/2020.  Patient denies previous perioperative complications with anesthesia in the past. In review of the available records, it is noted that patient underwent a general anesthetic course here (ASA IV) in 10/2020 without documented complications.   Vitals with BMI 11/30/2020 11/19/2020 11/16/2020  Height 5\' 3"  5\' 3"  5\' 3"   Weight 132 lbs 15 oz 138 lbs 143 lbs 2 oz  BMI 23.55 24.45  25.36  Systolic Q000111Q [id number]  Diastolic 66 62 70  Pulse 80 87 55    Providers/Specialists:   NOTE: Primary physician provider listed below. Patient may have been seen by APP or partner within same practice.   PROVIDER ROLE / SPECIALTY LAST OV  Hooten, Laurice Record, MD  Orthopedics (Surgeon)  12/06/2020  McLean-Scocuzza, Nino Glow, MD  Primary Care Provider  11/16/2020  Kate Sable, MD  Cardiology  11/19/2020   Allergies:  Patient has no known allergies.  Current Home Medications:   No current facility-administered medications for this encounter.   Marland Kitchen amiodarone (PACERONE) 200 MG tablet  . Calcium-Magnesium-Vitamin D (CALCIUM 1200+D3 PO)  . cholecalciferol (VITAMIN D3) 25 MCG (1000 UNIT) tablet  . Ferrous Sulfate (IRON) 325 (65 Fe) MG TABS  . furosemide (LASIX) 20 MG tablet  . Multiple Vitamins-Minerals (MULTIVITAMIN WITH MINERALS) tablet  . vitamin B-12 (CYANOCOBALAMIN) 100 MCG tablet  . B Complex-C-Folic Acid TABS  . Omega-3 Fatty Acids (FISH OIL) 1200 MG CPDR  . OVER THE COUNTER MEDICATION  . rivaroxaban (XARELTO) 20 MG TABS tablet   History:   Past Medical History:  Diagnosis Date  . A-fib ([place])   . Anemia   . Aortic atherosclerosis ([place])   . Arthritis    knees, right shoulder   . CAD (coronary artery disease)   . DDD (degenerative disc disease), lumbar   . History of chicken pox   . History of kidney stones    H/O  . PAC (premature atrial contraction)   . UTI (urinary tract infection)    Past Surgical History:  Procedure Laterality Date  . [place]  . EYE SURGERY     cataract 2013/2014 b/l   . FOOT SURGERY     bunion an dhammer toe in 2009  . TEE WITHOUT CARDIOVERSION N/A 11/07/2020   Procedure: TRANSESOPHAGEAL ECHOCARDIOGRAM (TEE);  Surgeon: Kate Sable, MD;  Location: [facility] ORS;  Service: Cardiovascular;  Laterality: N/A;   Family History  Problem Relation Age of Onset  . Heart disease Mother        died when pt was 2 y.o    . Heart Problems Mother   . Heart disease Father    Social History   Tobacco Use  . Smoking status: Never Smoker  . Smokeless tobacco: Never Used  Vaping Use  . Vaping Use: Never used  Substance Use Topics  . Alcohol use: Not Currently  . Drug use: Never    Pertinent Clinical Results:  LABS: Labs reviewed: Acceptable for surgery.  Hospital Outpatient Visit on 12/10/2020  Component Date Value Ref Range Status  . SARS Coronavirus 2 12/10/2020 NEGATIVE  NEGATIVE Final   Comment: (NOTE) SARS-CoV-2 target nucleic acids are NOT DETECTED.  The SARS-CoV-2 RNA is generally detectable in upper and lower respiratory specimens during the acute phase of infection. Negative results do not preclude SARS-CoV-2 infection, do not rule out co-infections with other pathogens, and should not be used as the sole basis for treatment or other patient management decisions. Negative results must be combined with clinical observations, patient history, and epidemiological information. The expected result is Negative.  Fact Sheet for Patients: SugarRoll.be  Fact Sheet for Healthcare Providers: https://www.woods-mathews.com/  This test is not yet approved or cleared by the [place] FDA and  has been authorized for detection and/or diagnosis of SARS-CoV-2 by FDA under an Emergency Use Authorization (EUA). This EUA will remain  in effect (meaning this test can be used) for  the duration of the COVID-19 declaration under Se                          ction 564(b)(1) of the Act, 21 U.S.C. section 360bbb-3(b)(1), unless the authorization is terminated or revoked sooner.  Performed at [facility], [place] [address] [place], [place] [address]   Hospital Outpatient Visit on 11/30/2020  Component Date Value Ref Range Status  . WBC 11/30/2020 8.5  4.0 - 10.5 K/uL Final  . RBC 11/30/2020 4.84  3.87 - 5.11 MIL/uL Final  . Hemoglobin 11/30/2020 11.4* 12.0  - 15.0 g/dL Final  . HCT 11/30/2020 36.1  36.0 - 46.0 % Final  . MCV 11/30/2020 74.6* 80.0 - 100.0 fL Final  . MCH 11/30/2020 23.6* 26.0 - 34.0 pg Final  . MCHC 11/30/2020 31.6  30.0 - 36.0 g/dL Final  . RDW 11/30/2020 16.0* 11.5 - 15.5 % Final  . Platelets 11/30/2020 222  150 - 400 K/uL Final  . nRBC 11/30/2020 0.0  0.0 - 0.2 % Final   Performed at [facility], [address] [place], [place] [address]  . Sodium 11/30/2020 137  135 - 145 mmol/L Final  . Potassium 11/30/2020 3.3* 3.5 - 5.1 mmol/L Final  . Chloride 11/30/2020 100  98 - 111 mmol/L Final  . CO2 11/30/2020 29  22 - 32 mmol/L Final  . Glucose, Bld 11/30/2020 102* 70 - 99 mg/dL Final   Glucose reference range applies only to samples taken after fasting for at least 8 hours.  . BUN 11/30/2020 17  8 - 23 mg/dL Final  . Creatinine, Ser 11/30/2020 0.70  0.44 - 1.00 mg/dL Final  . Calcium 11/30/2020 9.4  8.9 - 10.3 mg/dL Final  . Total Protein 11/30/2020 6.7  6.5 - 8.1 g/dL Final  . Albumin 11/30/2020 3.7  3.5 - 5.0 g/dL Final  . AST 11/30/2020 24  15 - 41 U/L Final  . ALT 11/30/2020 22  0 - 44 U/L Final  . Alkaline Phosphatase 11/30/2020 58  38 - 126 U/L Final  . Total Bilirubin 11/30/2020 0.8  0.3 - 1.2 mg/dL Final  . GFR, Estimated 11/30/2020 >60  >60 mL/min Final   Comment: (NOTE) Calculated using the CKD-EPI Creatinine Equation (2021)   . Anion gap 11/30/2020 8  5 - 15 Final   Performed at [facility], [address] [place], [place] [address]  . Prothrombin Time 11/30/2020 16.9* 11.4 - 15.2 seconds Final  . INR 11/30/2020 1.4* 0.8 - 1.2 Final   Comment: (NOTE) INR goal varies based on device and disease states. Performed at [facility], [address], [place], [place] [address]   . aPTT 11/30/2020 29  24 - 36 seconds Final   Performed at [facility], [address] [place], [place] [address]  . Color, Urine 11/30/2020 YELLOW* YELLOW Final  . APPearance 11/30/2020  HAZY* CLEAR Final  . Specific Gravity, Urine 11/30/2020 1.013  1.005 - 1.030 Final  . pH 11/30/2020 7.0  5.0 - 8.0 Final  . Glucose, UA 11/30/2020 NEGATIVE  NEGATIVE mg/dL Final  . Hgb urine dipstick 11/30/2020 SMALL* NEGATIVE Final  . Bilirubin Urine 11/30/2020 NEGATIVE  NEGATIVE Final  . Ketones, ur 11/30/2020 NEGATIVE  NEGATIVE mg/dL Final  . Protein, ur 11/30/2020 NEGATIVE  NEGATIVE mg/dL Final  . Nitrite 11/30/2020 NEGATIVE  NEGATIVE Final  . Chalmers Guest 11/30/2020 NEGATIVE  NEGATIVE Final  . RBC / HPF 11/30/2020 0-5  0 - 5 RBC/hpf Final  . WBC, UA 11/30/2020 0-5  0 - 5 WBC/hpf Final  . Bacteria, UA 11/30/2020 RARE* NONE SEEN Final  . Squamous Epithelial / LPF 11/30/2020 0-5  0 - 5 Final  . Mucus 11/30/2020 PRESENT   Final   Performed at [facility], [address] [place], [place] [address]  . Specimen Description 11/30/2020    Final                   Value:URINE, CLEAN CATCH Performed at [facility], [address] [place], [place] [address]   . Special Requests 11/30/2020    Final                   Value:Normal Performed at [facility], [address] [place], [place] [address]   . Culture 11/30/2020 MULTIPLE SPECIES PRESENT, SUGGEST RECOLLECTION*  Final  . Report Status 11/30/2020 12/02/2020 FINAL   Final  . ABO/RH(D) 11/30/2020 A POS   Final  . Antibody Screen 11/30/2020 NEG   Final  . Sample Expiration 11/30/2020    Final                   Value:12/03/2020,2359 Performed at [facility], [address] [place], [place] [address]   . CRP 11/30/2020 0.6  <1.0 mg/dL Final   Performed at [facility] [address] [place], [place] [address]  . MRSA, PCR 11/30/2020 NEGATIVE  NEGATIVE Final  . Staphylococcus aureus 11/30/2020 NEGATIVE  NEGATIVE Final   Comment: (NOTE) The Xpert SA Assay (FDA approved for NASAL specimens in patients 60 years of age and older), is one component of a comprehensive surveillance program.  It is not intended to diagnose infection nor to guide or monitor treatment. Performed at [facility], [address] [place], [place] [address]   . Sed Rate 11/30/2020 14  0 - 30 mm/hr Final   Performed at [facility], [address] [place], [place] [address]     ECG: Date: 11/19/2020 Time ECG obtained: 1555 PM Rate: 87 bpm Rhythm: Sinus rhythm with PACs Axis (leads I and aVF): Normal Intervals: PR 134 ms. QRS 74 ms. QTc 382 ms. ST segment and T wave changes: No evidence of acute ST segment elevation or depression Comparison: Previous tracing on 11/07/2020 showed A. fib with RVR at a rate of 156 bpm   IMAGING / PROCEDURES: TRANSESOPHAGEAL ECHOCARDIOGRAM performed on 11/07/2020 1. Left ventricular ejection fraction, by estimation, is 50%. The left ventricle has low normal function.  2. Right ventricular systolic function is low normal. The right ventricular size is normal.  3. Left atrial size was moderately dilated. No left atrial/left atrial appendage thrombus was detected.  4. The mitral valve is normal in structure. Mild to moderate mitral valve regurgitation.  5. The aortic valve is tricuspid. Aortic valve regurgitation is trivial.   LONG TERM CARDIAC EVENT MONITOR STUDY performed on 10/11/2019 1. Predominantly underlying sinus rhythm 2. Minimum heart rate of 59 bpm maximum heart rate of 245 bpm and an average heart rate 80 bpm 3. Frequent atrial tachycardias and SVT runs occurred.  Some episodes of SVT may be possible atrial tachycardia 4. Isolated SVE's were frequent (9.7%; 153,104) 5. SVE couplets were occasional (3.4%, 27,095)  ECHOCARDIOGRAM performed on 09/06/2019 1. Heart rate is elevated and irregular. Underlying atrial fibrillation cannot be excluded. Recommend EKG correlation.  2. Left ventricular ejection fraction, by estimation, is 60 to  65%. The left ventricle has normal function. The left ventrical is not well visualized. There is  mildly increased left ventricular hypertrophy. Left ventricular diastolic parameters are indeterminate.  3. Right ventricular systolic function is normal. The right ventricular size is not well visualized. Tricuspid regurgitation signal is inadequate for assessing PA pressure.  4. Left atrial size was mildly dilated.  5. Right atrial size was mildly dilated.  6. Trivial mitral valve regurgitation.  7. The aortic valve is tricuspid. Aortic valve regurgitation is not visualized. No aortic stenosis is present.   Impression and Plan:  Alden Feagan has been referred for pre-anesthesia review and clearance prior to her undergoing the planned anesthetic and procedural courses. Available labs, pertinent testing, and imaging results were personally reviewed by me. This patient has been appropriately cleared by cardiology with an overall ACCEPTABLE risk of significant perioperative cardiovascular complications.  Based on clinical review performed today (12/11/20), barring any significant acute changes in the patient's overall condition, it is anticipated that she will be able to proceed with the planned surgical intervention. Any acute changes in clinical condition may necessitate her procedure being postponed and/or cancelled. Patient will meet with anesthesia team (MD and/or CRNA) on the day of her procedure for preoperative evaluation/assessment. Questions regarding anesthetic course will be fielded at that time.   Pre-surgical instructions were reviewed with the patient during her PAT appointment and questions were fielded by PAT clinical staff. Patient was advised that if any questions or concerns arise prior to her procedure then she should return a call to PAT and/or her surgeon's office to discuss.  Honor Loh, MSN, APRN, FNP-C, CEN [facility]  Peri-operative Services Nurse Practitioner Phone: [phone] 12/11/20 11:15 AM  NOTE: This note has been prepared using Dragon  dictation software. Despite my best ability to proofread, there is always the potential that unintentional transcriptional errors may still occur from this process.

## 2020-12-12 ENCOUNTER — Inpatient Hospital Stay: Payer: Medicare Other

## 2020-12-12 ENCOUNTER — Encounter
Admission: RE | Disposition: A | Discharge status: Home / Self Care | Payer: Self-pay | Source: Home / Self Care | Attending: Orthopedic Surgery

## 2020-12-12 ENCOUNTER — Inpatient Hospital Stay: Payer: Medicare Other | Admitting: Urgent Care

## 2020-12-12 ENCOUNTER — Other Ambulatory Visit: Payer: Self-pay

## 2020-12-12 ENCOUNTER — Encounter: Payer: Self-pay | Admitting: Orthopedic Surgery

## 2020-12-12 ENCOUNTER — Inpatient Hospital Stay
Admission: RE | Admit: 2020-12-12 | Discharge: 2020-12-14 | DRG: 470 | Disposition: A | Discharge status: Home Health | Payer: Medicare Other | Attending: Orthopedic Surgery | Admitting: Orthopedic Surgery

## 2020-12-12 DIAGNOSIS — I4892 Unspecified atrial flutter: Secondary | ICD-10-CM | POA: Diagnosis present

## 2020-12-12 DIAGNOSIS — I2510 Atherosclerotic heart disease of native coronary artery without angina pectoris: Secondary | ICD-10-CM | POA: Diagnosis present

## 2020-12-12 DIAGNOSIS — Z7901 Long term (current) use of anticoagulants: Secondary | ICD-10-CM

## 2020-12-12 DIAGNOSIS — Z96652 Presence of left artificial knee joint: Secondary | ICD-10-CM | POA: Diagnosis not present

## 2020-12-12 DIAGNOSIS — M8580 Other specified disorders of bone density and structure, unspecified site: Secondary | ICD-10-CM | POA: Diagnosis present

## 2020-12-12 DIAGNOSIS — Z471 Aftercare following joint replacement surgery: Secondary | ICD-10-CM | POA: Diagnosis not present

## 2020-12-12 DIAGNOSIS — D649 Anemia, unspecified: Secondary | ICD-10-CM | POA: Diagnosis present

## 2020-12-12 DIAGNOSIS — I700 Atherosclerosis of aorta: Secondary | ICD-10-CM | POA: Diagnosis present

## 2020-12-12 DIAGNOSIS — Z79899 Other long term (current) drug therapy: Secondary | ICD-10-CM

## 2020-12-12 DIAGNOSIS — Z20822 Contact with and (suspected) exposure to covid-19: Secondary | ICD-10-CM | POA: Diagnosis present

## 2020-12-12 DIAGNOSIS — Z8249 Family history of ischemic heart disease and other diseases of the circulatory system: Secondary | ICD-10-CM

## 2020-12-12 DIAGNOSIS — Z9889 Other specified postprocedural states: Secondary | ICD-10-CM | POA: Diagnosis not present

## 2020-12-12 DIAGNOSIS — M1712 Unilateral primary osteoarthritis, left knee: Secondary | ICD-10-CM | POA: Diagnosis present

## 2020-12-12 DIAGNOSIS — Z96659 Presence of unspecified artificial knee joint: Secondary | ICD-10-CM

## 2020-12-12 DIAGNOSIS — I480 Paroxysmal atrial fibrillation: Secondary | ICD-10-CM | POA: Diagnosis present

## 2020-12-12 HISTORY — PX: KNEE ARTHROPLASTY: SHX992

## 2020-12-12 HISTORY — DX: Atherosclerotic heart disease of native coronary artery without angina pectoris: I25.10

## 2020-12-12 HISTORY — DX: Atherosclerosis of aorta: I70.0

## 2020-12-12 HISTORY — DX: Other intervertebral disc degeneration, lumbar region without mention of lumbar back pain or lower extremity pain: M51.369

## 2020-12-12 HISTORY — DX: Presence of unspecified artificial knee joint: Z96.659

## 2020-12-12 HISTORY — DX: Other intervertebral disc degeneration, lumbar region: M51.36

## 2020-12-12 LAB — ABO/RH: ABO/RH(D): A POS

## 2020-12-12 SURGERY — ARTHROPLASTY, KNEE, TOTAL, USING IMAGELESS COMPUTER-ASSISTED NAVIGATION
Anesthesia: Spinal | Site: Knee | Laterality: Left

## 2020-12-12 MED ORDER — CEFAZOLIN SODIUM-DEXTROSE 2-4 GM/100ML-% IV SOLN
2.0000 g | INTRAVENOUS | Status: AC
  Administered 2020-12-12: 2 g via INTRAVENOUS

## 2020-12-12 MED ORDER — PROPOFOL 10 MG/ML IV BOLUS
INTRAVENOUS | Status: DC | PRN
  Administered 2020-12-12: 10 mg via INTRAVENOUS
  Administered 2020-12-12: 20 mg via INTRAVENOUS

## 2020-12-12 MED ORDER — ONDANSETRON HCL 4 MG PO TABS: 4.0000 mg | ORAL_TABLET | Freq: Four times a day (QID) | ORAL | Status: DC | PRN

## 2020-12-12 MED ORDER — PROPOFOL 1000 MG/100ML IV EMUL
INTRAVENOUS | Status: AC
  Filled 2020-12-12: qty 100

## 2020-12-12 MED ORDER — GABAPENTIN 300 MG PO CAPS: 300.0000 mg | ORAL_CAPSULE | Freq: Once | ORAL | Status: AC

## 2020-12-12 MED ORDER — BUPIVACAINE HCL (PF) 0.25 % IJ SOLN
INTRAMUSCULAR | Status: DC | PRN
  Administered 2020-12-12: 60 mL

## 2020-12-12 MED ORDER — TRANEXAMIC ACID-NACL 1000-0.7 MG/100ML-% IV SOLN
INTRAVENOUS | Status: AC
  Administered 2020-12-12: 1000 mg via INTRAVENOUS
  Filled 2020-12-12: qty 100

## 2020-12-12 MED ORDER — GABAPENTIN 300 MG PO CAPS
ORAL_CAPSULE | ORAL | Status: AC
  Administered 2020-12-12: 300 mg via ORAL
  Filled 2020-12-12: qty 1

## 2020-12-12 MED ORDER — CELECOXIB 200 MG PO CAPS
ORAL_CAPSULE | ORAL | Status: AC
  Administered 2020-12-12: 400 mg via ORAL
  Filled 2020-12-12: qty 2

## 2020-12-12 MED ORDER — FISH OIL 1200 MG PO CPDR: 1.0000 | DELAYED_RELEASE_CAPSULE | Freq: Every day | ORAL | Status: DC

## 2020-12-12 MED ORDER — TRANEXAMIC ACID-NACL 1000-0.7 MG/100ML-% IV SOLN: 1000.0000 mg | Freq: Once | INTRAVENOUS | Status: AC

## 2020-12-12 MED ORDER — FERROUS SULFATE 325 (65 FE) MG PO TABS
325.0000 mg | ORAL_TABLET | Freq: Every day | ORAL | Status: DC
  Administered 2020-12-13 – 2020-12-14 (×2): 325 mg via ORAL
  Filled 2020-12-12 (×2): qty 1

## 2020-12-12 MED ORDER — MAGNESIUM HYDROXIDE 400 MG/5ML PO SUSP
30.0000 mL | Freq: Every day | ORAL | Status: DC
  Administered 2020-12-13 – 2020-12-14 (×2): 30 mL via ORAL
  Filled 2020-12-12 (×2): qty 30

## 2020-12-12 MED ORDER — BUPIVACAINE HCL (PF) 0.25 % IJ SOLN
INTRAMUSCULAR | Status: AC
  Filled 2020-12-12: qty 60

## 2020-12-12 MED ORDER — ENSURE PRE-SURGERY PO LIQD
296.0000 mL | Freq: Once | ORAL | Status: DC
  Filled 2020-12-12: qty 296

## 2020-12-12 MED ORDER — ACETAMINOPHEN 10 MG/ML IV SOLN
INTRAVENOUS | Status: DC | PRN
  Administered 2020-12-12: 1000 mg via INTRAVENOUS

## 2020-12-12 MED ORDER — PROPOFOL 500 MG/50ML IV EMUL
INTRAVENOUS | Status: DC | PRN
  Administered 2020-12-12: 50 ug/kg/min via INTRAVENOUS

## 2020-12-12 MED ORDER — CHLORHEXIDINE GLUCONATE 0.12 % MT SOLN
OROMUCOSAL | Status: AC
  Administered 2020-12-12: 15 mL via OROMUCOSAL
  Filled 2020-12-12: qty 15

## 2020-12-12 MED ORDER — BUPIVACAINE HCL (PF) 0.5 % IJ SOLN
INTRAMUSCULAR | Status: DC | PRN
  Administered 2020-12-12: 3 mL

## 2020-12-12 MED ORDER — ONDANSETRON HCL 4 MG/2ML IJ SOLN: 4.0000 mg | Freq: Four times a day (QID) | INTRAMUSCULAR | Status: DC | PRN

## 2020-12-12 MED ORDER — SODIUM CHLORIDE FLUSH 0.9 % IV SOLN
INTRAVENOUS | Status: AC
  Filled 2020-12-12: qty 40

## 2020-12-12 MED ORDER — ONDANSETRON HCL 4 MG/2ML IJ SOLN
INTRAMUSCULAR | Status: DC | PRN
  Administered 2020-12-12: 4 mg via INTRAVENOUS

## 2020-12-12 MED ORDER — FAMOTIDINE 20 MG PO TABS: 20.0000 mg | ORAL_TABLET | Freq: Once | ORAL | Status: AC

## 2020-12-12 MED ORDER — DEXAMETHASONE SODIUM PHOSPHATE 10 MG/ML IJ SOLN
INTRAMUSCULAR | Status: AC
  Administered 2020-12-12: 8 mg via INTRAVENOUS
  Filled 2020-12-12: qty 1

## 2020-12-12 MED ORDER — ACETAMINOPHEN 10 MG/ML IV SOLN
INTRAVENOUS | Status: AC
  Filled 2020-12-12: qty 100

## 2020-12-12 MED ORDER — ACETAMINOPHEN 325 MG PO TABS: 325.0000 mg | ORAL_TABLET | Freq: Four times a day (QID) | ORAL | Status: DC | PRN

## 2020-12-12 MED ORDER — RIVAROXABAN 20 MG PO TABS
20.0000 mg | ORAL_TABLET | ORAL | Status: DC
  Administered 2020-12-13 – 2020-12-14 (×2): 20 mg via ORAL
  Filled 2020-12-12 (×3): qty 1

## 2020-12-12 MED ORDER — CEFAZOLIN SODIUM-DEXTROSE 2-4 GM/100ML-% IV SOLN
INTRAVENOUS | Status: AC
  Filled 2020-12-12: qty 100

## 2020-12-12 MED ORDER — VITAMIN B-12 100 MCG PO TABS
100.0000 ug | ORAL_TABLET | Freq: Every day | ORAL | Status: DC
  Administered 2020-12-14: 100 ug via ORAL
  Filled 2020-12-12 (×3): qty 1

## 2020-12-12 MED ORDER — CEFAZOLIN SODIUM-DEXTROSE 2-4 GM/100ML-% IV SOLN
2.0000 g | Freq: Four times a day (QID) | INTRAVENOUS | Status: AC
  Administered 2020-12-12 – 2020-12-13 (×2): 2 g via INTRAVENOUS
  Filled 2020-12-12 (×2): qty 100

## 2020-12-12 MED ORDER — TRANEXAMIC ACID-NACL 1000-0.7 MG/100ML-% IV SOLN
1000.0000 mg | INTRAVENOUS | Status: AC
  Administered 2020-12-12: 1000 mg via INTRAVENOUS

## 2020-12-12 MED ORDER — CELECOXIB 200 MG PO CAPS: 400.0000 mg | ORAL_CAPSULE | Freq: Once | ORAL | Status: AC

## 2020-12-12 MED ORDER — SENNOSIDES-DOCUSATE SODIUM 8.6-50 MG PO TABS
1.0000 | ORAL_TABLET | Freq: Two times a day (BID) | ORAL | Status: DC
  Administered 2020-12-12 – 2020-12-14 (×4): 1 via ORAL
  Filled 2020-12-12 (×4): qty 1

## 2020-12-12 MED ORDER — BISACODYL 10 MG RE SUPP
10.0000 mg | Freq: Every day | RECTAL | Status: DC | PRN
  Administered 2020-12-14: 10 mg via RECTAL
  Filled 2020-12-12: qty 1

## 2020-12-12 MED ORDER — FENTANYL CITRATE (PF) 100 MCG/2ML IJ SOLN: 25.0000 ug | INTRAMUSCULAR | Status: DC | PRN

## 2020-12-12 MED ORDER — SODIUM CHLORIDE 0.9 % IV SOLN
INTRAVENOUS | Status: DC | PRN
  Administered 2020-12-12: 30 ug/min via INTRAVENOUS

## 2020-12-12 MED ORDER — ORAL CARE MOUTH RINSE: 15.0000 mL | Freq: Once | OROMUCOSAL | Status: AC

## 2020-12-12 MED ORDER — ACETAMINOPHEN 10 MG/ML IV SOLN
1000.0000 mg | Freq: Four times a day (QID) | INTRAVENOUS | Status: AC
  Administered 2020-12-12 – 2020-12-13 (×4): 1000 mg via INTRAVENOUS
  Filled 2020-12-12 (×4): qty 100

## 2020-12-12 MED ORDER — MENTHOL 3 MG MT LOZG
1.0000 | LOZENGE | OROMUCOSAL | Status: DC | PRN
  Filled 2020-12-12: qty 9

## 2020-12-12 MED ORDER — FAMOTIDINE 20 MG PO TABS
ORAL_TABLET | ORAL | Status: AC
  Administered 2020-12-12: 20 mg via ORAL
  Filled 2020-12-12: qty 1

## 2020-12-12 MED ORDER — ALUM & MAG HYDROXIDE-SIMETH 200-200-20 MG/5ML PO SUSP: 30.0000 mL | ORAL | Status: DC | PRN

## 2020-12-12 MED ORDER — VITAMIN D 25 MCG (1000 UNIT) PO TABS
1000.0000 [IU] | ORAL_TABLET | Freq: Every day | ORAL | Status: DC
  Administered 2020-12-13 – 2020-12-14 (×2): 1000 [IU] via ORAL
  Filled 2020-12-12 (×2): qty 1

## 2020-12-12 MED ORDER — AMIODARONE HCL 200 MG PO TABS
200.0000 mg | ORAL_TABLET | ORAL | Status: DC
  Administered 2020-12-13 – 2020-12-14 (×2): 200 mg via ORAL
  Filled 2020-12-12 (×2): qty 1

## 2020-12-12 MED ORDER — DEXAMETHASONE SODIUM PHOSPHATE 10 MG/ML IJ SOLN: 8.0000 mg | Freq: Once | INTRAMUSCULAR | Status: AC

## 2020-12-12 MED ORDER — OXYCODONE HCL 5 MG PO TABS: 5.0000 mg | ORAL_TABLET | ORAL | Status: DC | PRN

## 2020-12-12 MED ORDER — PHENOL 1.4 % MT LIQD
1.0000 | OROMUCOSAL | Status: DC | PRN
  Filled 2020-12-12: qty 177

## 2020-12-12 MED ORDER — SODIUM CHLORIDE 0.9 % IV SOLN: INTRAVENOUS | Status: DC

## 2020-12-12 MED ORDER — ADULT MULTIVITAMIN W/MINERALS CH
1.0000 | ORAL_TABLET | Freq: Every day | ORAL | Status: DC
  Administered 2020-12-13 – 2020-12-14 (×2): 1 via ORAL
  Filled 2020-12-12 (×4): qty 1

## 2020-12-12 MED ORDER — B COMPLEX-C-FOLIC ACID PO TABS
1.0000 | ORAL_TABLET | Freq: Every day | ORAL | Status: DC
  Filled 2020-12-12: qty 1

## 2020-12-12 MED ORDER — IRON 325 (65 FE) MG PO TABS: 325.0000 mg | ORAL_TABLET | Freq: Every day | ORAL | Status: DC

## 2020-12-12 MED ORDER — CHLORHEXIDINE GLUCONATE 4 % EX LIQD: 60.0000 mL | Freq: Once | CUTANEOUS | Status: DC

## 2020-12-12 MED ORDER — CELECOXIB 200 MG PO CAPS
200.0000 mg | ORAL_CAPSULE | Freq: Two times a day (BID) | ORAL | Status: DC
  Administered 2020-12-12 – 2020-12-14 (×4): 200 mg via ORAL
  Filled 2020-12-12 (×4): qty 1

## 2020-12-12 MED ORDER — DIPHENHYDRAMINE HCL 12.5 MG/5ML PO ELIX: 12.5000 mg | ORAL_SOLUTION | ORAL | Status: DC | PRN

## 2020-12-12 MED ORDER — FUROSEMIDE 20 MG PO TABS
20.0000 mg | ORAL_TABLET | Freq: Every day | ORAL | Status: DC
  Administered 2020-12-13 – 2020-12-14 (×2): 20 mg via ORAL
  Filled 2020-12-12 (×2): qty 1

## 2020-12-12 MED ORDER — TRANEXAMIC ACID-NACL 1000-0.7 MG/100ML-% IV SOLN
INTRAVENOUS | Status: AC
  Filled 2020-12-12: qty 100

## 2020-12-12 MED ORDER — CHLORHEXIDINE GLUCONATE 0.12 % MT SOLN: 15.0000 mL | Freq: Once | OROMUCOSAL | Status: AC

## 2020-12-12 MED ORDER — LACTATED RINGERS IV SOLN: INTRAVENOUS | Status: DC

## 2020-12-12 MED ORDER — METOCLOPRAMIDE HCL 10 MG PO TABS
10.0000 mg | ORAL_TABLET | Freq: Three times a day (TID) | ORAL | Status: DC
  Administered 2020-12-12 – 2020-12-13 (×4): 10 mg via ORAL
  Filled 2020-12-12 (×5): qty 1

## 2020-12-12 MED ORDER — FLEET ENEMA 7-19 GM/118ML RE ENEM: 1.0000 | ENEMA | Freq: Once | RECTAL | Status: DC | PRN

## 2020-12-12 MED ORDER — HYDROMORPHONE HCL 1 MG/ML IJ SOLN: 0.5000 mg | INTRAMUSCULAR | Status: DC | PRN

## 2020-12-12 MED ORDER — BUPIVACAINE LIPOSOME 1.3 % IJ SUSP
INTRAMUSCULAR | Status: AC
  Filled 2020-12-12: qty 20

## 2020-12-12 MED ORDER — OXYCODONE HCL 5 MG PO TABS: 10.0000 mg | ORAL_TABLET | ORAL | Status: DC | PRN

## 2020-12-12 MED ORDER — PANTOPRAZOLE SODIUM 40 MG PO TBEC
40.0000 mg | DELAYED_RELEASE_TABLET | Freq: Two times a day (BID) | ORAL | Status: DC
  Administered 2020-12-13 – 2020-12-14 (×3): 40 mg via ORAL
  Filled 2020-12-12 (×3): qty 1

## 2020-12-12 MED ORDER — TRAMADOL HCL 50 MG PO TABS
50.0000 mg | ORAL_TABLET | ORAL | Status: DC | PRN
  Administered 2020-12-12 – 2020-12-14 (×3): 50 mg via ORAL
  Filled 2020-12-12 (×3): qty 1

## 2020-12-12 MED ORDER — NEOMYCIN-POLYMYXIN B GU 40-200000 IR SOLN
Status: AC
  Filled 2020-12-12: qty 20

## 2020-12-12 MED ORDER — OMEGA-3-ACID ETHYL ESTERS 1 G PO CAPS
1.0000 g | ORAL_CAPSULE | Freq: Every day | ORAL | Status: DC
  Administered 2020-12-13 – 2020-12-14 (×2): 1 g via ORAL
  Filled 2020-12-12 (×2): qty 1

## 2020-12-12 MED ORDER — SODIUM CHLORIDE 0.9 % IV SOLN
INTRAVENOUS | Status: DC | PRN
  Administered 2020-12-12: 60 mL

## 2020-12-12 SURGICAL SUPPLY — 79 items
ATTUNE PSFEM LTSZ5 NARCEM KNEE (Femur) ×1 IMPLANT
ATTUNE PSRP INSR SZ5 5 KNEE (Insert) ×1 IMPLANT
BASE TIBIAL ROT PLAT SZ 3 KNEE (Knees) IMPLANT
BATTERY INSTRU NAVIGATION (MISCELLANEOUS) ×8 IMPLANT
BLADE SAW 70X12.5 (BLADE) ×2 IMPLANT
BLADE SAW 90X13X1.19 OSCILLAT (BLADE) ×2 IMPLANT
BLADE SAW 90X25X1.19 OSCILLAT (BLADE) ×2 IMPLANT
BNDG COHESIVE 4X5 TAN STRL (GAUZE/BANDAGES/DRESSINGS) ×1 IMPLANT
CATH FOLEY 2WAY 12FR 5CC LATEX (CATHETERS) ×1 IMPLANT
CEMENT HV SMART SET (Cement) ×2 IMPLANT
COOLER POLAR GLACIER W/PUMP (MISCELLANEOUS) ×2 IMPLANT
COVER WAND RF STERILE (DRAPES) ×2 IMPLANT
CUFF TOURN SGL QUICK 24 (TOURNIQUET CUFF)
CUFF TOURN SGL QUICK 34 (TOURNIQUET CUFF)
CUFF TRNQT CYL 24X4X16.5-23 (TOURNIQUET CUFF) IMPLANT
CUFF TRNQT CYL 34X4.125X (TOURNIQUET CUFF) IMPLANT
DRAPE 3/4 80X56 (DRAPES) ×3 IMPLANT
DRAPE U-SHAPE 47X51 STRL (DRAPES) ×1 IMPLANT
DRSG DERMACEA 8X12 NADH (GAUZE/BANDAGES/DRESSINGS) ×2 IMPLANT
DRSG MEPILEX SACRM 8.7X9.8 (GAUZE/BANDAGES/DRESSINGS) ×2 IMPLANT
DRSG OPSITE POSTOP 4X14 (GAUZE/BANDAGES/DRESSINGS) ×2 IMPLANT
DRSG TEGADERM 4X4.75 (GAUZE/BANDAGES/DRESSINGS) ×2 IMPLANT
DURAPREP 26ML APPLICATOR (WOUND CARE) ×4 IMPLANT
ELECT CAUTERY BLADE 6.4 (BLADE) ×2 IMPLANT
ELECT REM PT RETURN 9FT ADLT (ELECTROSURGICAL) ×2
ELECTRODE REM PT RTRN 9FT ADLT (ELECTROSURGICAL) ×1 IMPLANT
EX-PIN ORTHOLOCK NAV 4X150 (PIN) ×4 IMPLANT
GLOVE SURG ENC MOIS LTX SZ7.5 (GLOVE) ×4 IMPLANT
GLOVE SURG ENC TEXT LTX SZ7.5 (GLOVE) ×4 IMPLANT
GLOVE SURG UNDER LTX SZ8 (GLOVE) ×2 IMPLANT
GLOVE SURG UNDER POLY LF SZ7.5 (GLOVE) ×2 IMPLANT
GOWN STRL REUS W/ TWL LRG LVL3 (GOWN DISPOSABLE) ×2 IMPLANT
GOWN STRL REUS W/ TWL XL LVL3 (GOWN DISPOSABLE) ×1 IMPLANT
GOWN STRL REUS W/TWL LRG LVL3 (GOWN DISPOSABLE) ×2
GOWN STRL REUS W/TWL XL LVL3 (GOWN DISPOSABLE) ×1
HEMOVAC 400CC 10FR (MISCELLANEOUS) ×2 IMPLANT
HOLDER FOLEY CATH W/STRAP (MISCELLANEOUS) ×2 IMPLANT
HOOD PEEL AWAY FLYTE STAYCOOL (MISCELLANEOUS) ×4 IMPLANT
IRRIGATION SURGIPHOR STRL (IV SOLUTION) ×2 IMPLANT
IV NS IRRIG 3000ML ARTHROMATIC (IV SOLUTION) ×2 IMPLANT
KIT TURNOVER KIT A (KITS) ×2 IMPLANT
KNIFE SCULPS 14X20 (INSTRUMENTS) ×2 IMPLANT
LABEL OR SOLS (LABEL) ×2 IMPLANT
MANIFOLD NEPTUNE II (INSTRUMENTS) ×4 IMPLANT
NDL SAFETY ECLIPSE 18X1.5 (NEEDLE) ×1 IMPLANT
NDL SPNL 20GX3.5 QUINCKE YW (NEEDLE) ×2 IMPLANT
NEEDLE HYPO 18GX1.5 SHARP (NEEDLE) ×1
NEEDLE SPNL 20GX3.5 QUINCKE YW (NEEDLE) ×4 IMPLANT
NS IRRIG 500ML POUR BTL (IV SOLUTION) ×2 IMPLANT
PACK TOTAL KNEE (MISCELLANEOUS) ×2 IMPLANT
PAD ABD DERMACEA PRESS 5X9 (GAUZE/BANDAGES/DRESSINGS) ×2 IMPLANT
PAD CAST CTTN 4X4 STRL (SOFTGOODS) IMPLANT
PAD WRAPON POLAR KNEE (MISCELLANEOUS) ×1 IMPLANT
PADDING CAST COTTON 4X4 STRL (SOFTGOODS) ×3
PATELLA MEDIAL ATTUN 35MM KNEE (Knees) ×1 IMPLANT
PENCIL SMOKE EVACUATOR COATED (MISCELLANEOUS) ×2 IMPLANT
PIN FIXATION 1/8DIA X 3INL (PIN) ×6 IMPLANT
PULSAVAC PLUS IRRIG FAN TIP (DISPOSABLE) ×2
SOL PREP PVP 2OZ (MISCELLANEOUS) ×2
SOLUTION PREP PVP 2OZ (MISCELLANEOUS) ×1 IMPLANT
SPONGE DRAIN TRACH 4X4 STRL 2S (GAUZE/BANDAGES/DRESSINGS) ×2 IMPLANT
STAPLER SKIN PROX 35W (STAPLE) ×2 IMPLANT
STOCKINETTE BIAS CUT 6 980064 (GAUZE/BANDAGES/DRESSINGS) ×1 IMPLANT
STOCKINETTE IMPERV 14X48 (MISCELLANEOUS) IMPLANT
STRAP TIBIA SHORT (MISCELLANEOUS) ×2 IMPLANT
SUCTION FRAZIER HANDLE 10FR (MISCELLANEOUS) ×1
SUCTION TUBE FRAZIER 10FR DISP (MISCELLANEOUS) ×1 IMPLANT
SUT VIC AB 0 CT1 36 (SUTURE) ×4 IMPLANT
SUT VIC AB 1 CT1 36 (SUTURE) ×4 IMPLANT
SUT VIC AB 2-0 CT2 27 (SUTURE) ×2 IMPLANT
SYR 20ML LL LF (SYRINGE) ×2 IMPLANT
SYR 30ML LL (SYRINGE) ×4 IMPLANT
TIBIAL BASE ROT PLAT SZ 3 KNEE (Knees) ×2 IMPLANT
TIP FAN IRRIG PULSAVAC PLUS (DISPOSABLE) ×1 IMPLANT
TOWEL OR 17X26 4PK STRL BLUE (TOWEL DISPOSABLE) ×2 IMPLANT
TOWER CARTRIDGE SMART MIX (DISPOSABLE) ×2 IMPLANT
TRAY FOLEY MTR SLVR 16FR STAT (SET/KITS/TRAYS/PACK) ×2 IMPLANT
TUBING CONNECTING 10 (TUBING) ×1 IMPLANT
WRAPON POLAR PAD KNEE (MISCELLANEOUS) ×2

## 2020-12-12 NOTE — Plan of Care (Signed)

## 2020-12-12 NOTE — Transfer of Care (Signed)
Immediate Anesthesia Transfer of Care Note  Patient: Kelli Smith  Procedure(s) Performed: COMPUTER ASSISTED TOTAL KNEE ARTHROPLASTY (Left Knee)  Patient Location: PACU  Anesthesia Type:Spinal  Level of Consciousness: sedated  Airway & Oxygen Therapy: Patient Spontanous Breathing and Patient connected to face mask oxygen  Post-op Assessment: Report given to RN and Post -op Vital signs reviewed and stable  Post vital signs: Reviewed and stable  Last Vitals:  Vitals Value Taken Time  BP 99/55 12/12/20 1631  Temp 36.8 C 12/12/20 1631  Pulse 68 12/12/20 1643  Resp 9 12/12/20 1643  SpO2 100 % 12/12/20 1643  Vitals shown include unvalidated device data.  Last Pain:  Vitals:   12/12/20 1631  TempSrc:   PainSc: Asleep         Complications: No complications documented.

## 2020-12-12 NOTE — Anesthesia Preprocedure Evaluation (Signed)
Anesthesia Evaluation  Patient identified by MRN, date of birth, ID band Patient awake    Reviewed: Allergy & Precautions, H&P , NPO status , Patient's Chart, lab work & pertinent test results, reviewed documented beta blocker date and time   History of Anesthesia Complications Negative for: history of anesthetic complications  Airway Mallampati: II   Neck ROM: full    Dental  (+) Poor Dentition, Edentulous Upper, Edentulous Lower, Dental Advidsory Given   Pulmonary neg pulmonary ROS,    Pulmonary exam normal        Cardiovascular Exercise Tolerance: Poor (-) hypertension(-) angina+ CAD  (-) Past MI and (-) Cardiac Stents + dysrhythmias Atrial Fibrillation (-) Valvular Problems/Murmurs Rhythm:regular Rate:Normal     Neuro/Psych neg Seizures  Neuromuscular disease negative psych ROS   GI/Hepatic negative GI ROS, Neg liver ROS,   Endo/Other  negative endocrine ROS  Renal/GU Renal disease (kidney stones)  negative genitourinary   Musculoskeletal   Abdominal   Peds  Hematology negative hematology ROS (+)   Anesthesia Other Findings Past Medical History: No date: Arthritis     Comment:  knees, right shoulder  No date: History of chicken pox No date: UTI (urinary tract infection) Past Surgical History: No date: CESAREAN SECTION     Comment:  1978 No date: EYE SURGERY     Comment:  cataract 2013/2014 b/l  No date: FOOT SURGERY     Comment:  bunion an dhammer toe in 2009   Reproductive/Obstetrics negative OB ROS                             Anesthesia Physical  Anesthesia Plan  ASA: IV  Anesthesia Plan: Spinal   Post-op Pain Management:    Induction:   PONV Risk Score and Plan: TIVA and Propofol infusion  Airway Management Planned: Natural Airway and Simple Face Mask  Additional Equipment:   Intra-op Plan:   Post-operative Plan:   Informed Consent: I have reviewed the  patients History and Physical, chart, labs and discussed the procedure including the risks, benefits and alternatives for the proposed anesthesia with the patient or authorized representative who has indicated his/her understanding and acceptance.     Dental Advisory Given  Plan Discussed with: CRNA  Anesthesia Plan Comments:         Anesthesia Quick Evaluation

## 2020-12-12 NOTE — H&P (Signed)
The patient has been re-examined, and the chart reviewed, and there have been no interval changes to the documented history and physical.    The risks, benefits, and alternatives have been discussed at length. The patient expressed understanding of the risks benefits and agreed with plans for surgical intervention.  Sharisa Toves P. Derrich Gaby, Jr. M.D.    

## 2020-12-13 ENCOUNTER — Encounter: Payer: Self-pay | Admitting: Orthopedic Surgery

## 2020-12-13 MED ORDER — B COMPLEX-C PO TABS
1.0000 | ORAL_TABLET | Freq: Every day | ORAL | Status: DC
  Filled 2020-12-13: qty 1

## 2020-12-13 MED ORDER — B COMPLEX-C PO TABS
1.0000 | ORAL_TABLET | Freq: Every day | ORAL | Status: DC
  Administered 2020-12-13 – 2020-12-14 (×2): 1 via ORAL
  Filled 2020-12-13 (×2): qty 1

## 2020-12-13 MED ORDER — FOLIC ACID 1 MG PO TABS
0.5000 mg | ORAL_TABLET | Freq: Every day | ORAL | Status: DC
  Administered 2020-12-14: 0.5 mg via ORAL
  Filled 2020-12-13: qty 1

## 2020-12-13 NOTE — Progress Notes (Signed)
Physical Therapy Treatment Patient Details Name: Kelli Smith MRN: [id number] DOB: Mar 14, 1941 Today's Date: 12/13/2020    History of Present Illness Pt admitted for L TKR. History includes DDD, Afib, and OA.    PT Comments    Pt is making good progress towards goals with ability to ambulate further distance in hallway. Still presents with little pain, encouraged to reach out for meds if indicated. Appears confused with orientation questions, however pleasant and agreeable to therapy. Good endurance with HEP, reviewed written packet. Will continue to progress as able.   Follow Up Recommendations  Home health PT;Supervision/Assistance - 24 hour     Equipment Recommendations  Rolling walker with 5" wheels    Recommendations for Other Services       Precautions / Restrictions Precautions Precautions: Fall;Knee Precaution Booklet Issued: Yes (comment) Restrictions Weight Bearing Restrictions: Yes LLE Weight Bearing: Weight bearing as tolerated    Mobility  Bed Mobility Overal bed mobility: Needs Assistance Bed Mobility: Supine to Sit     Supine to sit: Min guard     General bed mobility comments: safe technique, follows commands well    Transfers Overall transfer level: Needs assistance Equipment used: Rolling walker (2 wheeled) Transfers: Sit to/from Stand Sit to Stand: Min guard         General transfer comment: cues for hand placement. Once standing, tends to forward flexed, cues for upright posture  Ambulation/Gait Ambulation/Gait assistance: Min guard Gait Distance (Feet): 160 Feet Assistive device: Rolling walker (2 wheeled) Gait Pattern/deviations: Step-to pattern     General Gait Details: inconsistent ability to ambulate with reciprocal gait pattern. Needs cues for sequencing   Stairs             Wheelchair Mobility    Modified Rankin (Stroke Patients Only)       Balance Overall balance assessment: Needs assistance Sitting-balance  support: Feet supported Sitting balance-Leahy Scale: Good     Standing balance support: Bilateral upper extremity supported Standing balance-Leahy Scale: Good                              Cognition Arousal/Alertness: Awake/alert Behavior During Therapy: WFL for tasks assessed/performed Overall Cognitive Status: No family/caregiver present to determine baseline cognitive functioning                                 General Comments: continues to be confused, unable to recite year/president      Exercises Total Joint Exercises Goniometric ROM: L knee AROM: 0-90 degrees Other Exercises Other Exercises: supine ther-ex performed x 10 reps including AP, QS, hip abd/add, SAQ, and knee flexion stretches. Other Exercises: Pt/dtr instructed in home/routines modifications, falls prevention strategies, AE/DME, compression stocking mgt, and polar care mgt; handout provided Other Exercises: ambulated to [facility] with cga. Able to perform self hygiene with supervision    General Comments        Pertinent Vitals/Pain Pain Assessment: 0-10 Pain Score: 3  Pain Location: L knee Pain Descriptors / Indicators: Operative site guarding Pain Intervention(s): Limited activity within patient's tolerance    Home Living Family/patient expects to be discharged to:: Private residence Living Arrangements: Children Available Help at Discharge: Family;Available 24 hours/day Type of Home: House Home Access: Stairs to enter Entrance Stairs-Rails:  (pt is poor historian, unusre) Home Layout: Able to live on main level with bedroom/bathroom;Two level Home Equipment: Cane - single point;Bedside  commode;Other (comment);Shower seat Additional Comments: pt is poor historian. History obtained from OT per conversation with daughter. Has tub rail.    Prior Function Level of Independence: Independent with assistive device(s)      Comments: PRN use with SPC. Per patient, she wasn't using any  AD PTA   PT Goals (current goals can now be found in the care plan section) Acute Rehab PT Goals Patient Stated Goal: to go home PT Goal Formulation: With patient Time For Goal Achievement: 12/27/20 Potential to Achieve Goals: Good Additional Goals Additional Goal #1: Pt will be able to perform bed mobility/transfers with supervision and safe technique in order to improve functional independence Progress towards PT goals: Progressing toward goals    Frequency    BID      PT Plan Current plan remains appropriate    Co-evaluation              AM-PAC PT "6 Clicks" Mobility   Outcome Measure  Help needed turning from your back to your side while in a flat bed without using bedrails?: A Little Help needed moving from lying on your back to sitting on the side of a flat bed without using bedrails?: A Little Help needed moving to and from a bed to a chair (including a wheelchair)?: A Little Help needed standing up from a chair using your arms (e.g., wheelchair or bedside chair)?: A Little Help needed to walk in hospital room?: A Little Help needed climbing 3-5 steps with a railing? : A Little 6 Click Score: 18    End of Session Equipment Utilized During Treatment: Gait belt Activity Tolerance: Patient tolerated treatment well Patient left: in bed;with bed alarm set Nurse Communication: Mobility status PT Visit Diagnosis: Pain;Muscle weakness (generalized) (M62.81);Difficulty in walking, not elsewhere classified (R26.2) Pain - Right/Left: Left Pain - part of body: Knee     Time: 1351-1420 PT Time Calculation (min) (ACUTE ONLY): 29 min  Charges:  $Gait Training: 8-22 mins $Therapeutic Exercise: 8-22 mins                     Greggory Stallion, PT, DPT [phone]    Merrissa Giacobbe 12/13/2020, 4:28 PM

## 2020-12-13 NOTE — Anesthesia Postprocedure Evaluation (Signed)
Anesthesia Post Note  Patient: Kelli Smith  Procedure(s) Performed: COMPUTER ASSISTED TOTAL KNEE ARTHROPLASTY (Left Knee)  Patient location during evaluation: Nursing Unit Anesthesia Type: Spinal Level of consciousness: oriented and awake and alert Pain management: pain level controlled Vital Signs Assessment: post-procedure vital signs reviewed and stable Respiratory status: spontaneous breathing and respiratory function stable Cardiovascular status: blood pressure returned to baseline and stable Postop Assessment: no headache, no backache, no apparent nausea or vomiting and patient able to bend at knees Anesthetic complications: no   No complications documented.   Last Vitals:  Vitals:   12/13/20 0606 12/13/20 0916  BP: (!) 124/58 121/60  Pulse: 78 82  Resp:    Temp:  (!) 36.4 C  SpO2:  100%    Last Pain:  Vitals:   12/13/20 0916  TempSrc: Oral  PainSc:                  Norm Salt

## 2020-12-13 NOTE — Evaluation (Signed)
Occupational Therapy Evaluation Patient Details Name: Kelli Smith MRN: [id number] DOB: [birth year] Today's Date: 12/13/2020    History of Present Illness Pt admitted for L TKR. History includes DDD, Afib, and OA.   Clinical Impression   Pt seen for OT evaluation this date, POD#1 from above surgery. Pt received in recliner, agreeable to therapy. She demonstrates difficulty with recall and orientation questions, requiring multiple choice answers and cues to orient. Unclear if this is baseline. Daughter present for last half of session and able to verify home set up info which is in line with pt's recall. Pt was independent in bathing, dressing, and toileting prior to surgery, however occasionally using a SPC for mobility due to L knee pain. Pt lives with her daughter in a 2 story town home (1/2 bath on 1st floor). Pt endorses doing some light meal prep occasionally but daughter does majority of meals, cleaning, and driving. Dtr reports she tries to minimize pt's driving. Dtr sets up medications and gives them to pt. Pt currently requires minimal assist for LB dressing and bathing while in seated position due to pain and limited AROM of L knee. Pt/dtr instructed in polar care mgt, falls prevention strategies, home/routines modifications, DME/AE for LB bathing and dressing tasks, and compression stocking mgt. Pt would benefit from skilled OT services including additional instruction in dressing techniques with or without assistive devices for dressing and bathing skills to support recall and carryover prior to discharge and ultimately to maximize safety, independence, and minimize falls risk and caregiver burden. Recommend [place] at discharge. Dtr endorses plan to stay with pt during recovery at home.      Follow Up Recommendations  Home health OT    Equipment Recommendations  None recommended by OT    Recommendations for Other Services       Precautions / Restrictions  Precautions Precautions: Fall;Knee Precaution Booklet Issued: Yes (comment) Restrictions Weight Bearing Restrictions: Yes LLE Weight Bearing: Weight bearing as tolerated      Mobility Bed Mobility               General bed mobility comments: NT, up in recliner    Transfers Overall transfer level: Needs assistance Equipment used: Rolling walker (2 wheeled) Transfers: Sit to/from Stand Sit to Stand: Min guard         General transfer comment: VC hand placement    Balance Overall balance assessment: Needs assistance Sitting-balance support: Feet supported Sitting balance-Leahy Scale: Good     Standing balance support: Bilateral upper extremity supported Standing balance-Leahy Scale: Good                             ADL either performed or assessed with clinical judgement   ADL Overall ADL's : Needs assistance/impaired                                       General ADL Comments: Pt requires MIN A for LB ADL tasks from seated position to initiate, CGA for ADL transfers with AD, MAX A for polar care and compression stocking mgt - pt/dtr agree dtr will be taking FMLA to help pt upon return home     Vision Patient Visual Report: No change from baseline       Perception     Praxis      Pertinent Vitals/Pain Pain Assessment:  (pt reports  L shoulder pain but "not plenty", denies L knee pain)     Hand Dominance Right   Extremity/Trunk Assessment Upper Extremity Assessment Upper Extremity Assessment: Generalized weakness;LUE deficits/detail LUE Deficits / Details: hx L shoulder injury/pain resulting in impaired shoulder AROM, able to achieve full assisted AROM but unable to maintain shoulder flexion   Lower Extremity Assessment Lower Extremity Assessment: Generalized weakness;LLE deficits/detail LLE Deficits / Details: s/p TKA       Communication Communication Communication: No difficulties (appears confused)   Cognition  Arousal/Alertness: Awake/alert Behavior During Therapy: WFL for tasks assessed/performed Overall Cognitive Status: No family/caregiver present to determine baseline cognitive functioning                                 General Comments: Pt oriented to self and requires cues to orient to situation, place, unable to recall year or president. Demo's difficulty with memory. Able to follow commands consistently.   General Comments       Exercises Other Exercises: Pt/dtr instructed in home/routines modifications, falls prevention strategies, AE/DME, compression stocking mgt, and polar care mgt; handout provided   Shoulder Instructions      Home Living Family/patient expects to be discharged to:: Private residence Living Arrangements: Children Available Help at Discharge: Family;Available 24 hours/day Type of Home: House Home Access: Stairs to enter [employer] of Steps: 1 Entrance Stairs-Rails:  (pt is poor historian, unusre) Home Layout: Able to live on main level with bedroom/bathroom;Two level     Bathroom Shower/Tub: Teacher, early years/pre: Handicapped height (BSC frame over toilet)     Home Equipment: Cane - single point;Bedside commode;Other (comment);Shower seat   Additional Comments: pt is poor historian. History obtained from OT per conversation with daughter. Has tub rail.      Prior Functioning/Environment Level of Independence: Independent with assistive device(s)        Comments: PRN use with SPC. Per patient, she wasn't using any AD PTA        OT Problem List: Decreased strength;Pain;Decreased range of motion;Impaired balance (sitting and/or standing);Decreased knowledge of use of DME or AE      OT Treatment/Interventions: Self-care/ADL training;Therapeutic exercise;Therapeutic activities;DME and/or AE instruction;Patient/family education;Balance training    OT Goals(Current goals can be found in the care plan section)  Acute Rehab OT Goals Patient Stated Goal: to go home OT Goal Formulation: With patient/family Time For Goal Achievement: 12/27/20 Potential to Achieve Goals: Good ADL Goals Pt Will Perform Lower Body Dressing: with caregiver independent in assisting;sit to/from stand Pt Will Transfer to Toilet: with supervision;ambulating (BSC over toilet, LRAD for amb) Additional ADL Goal #1: Pt/family will demonstrate independence with compression stocking mgt Additional ADL Goal #2: Pt/family will demo independence with polar care mgt  OT Frequency: Min 1X/week   Barriers to D/C:            Co-evaluation              AM-PAC OT "6 Clicks" Daily Activity     Outcome Measure Help from another person eating meals?: None Help from another person taking care of personal grooming?: A Little Help from another person toileting, which includes using toliet, bedpan, or urinal?: A Little Help from another person bathing (including washing, rinsing, drying)?: A Little Help from another person to put on and taking off regular upper body clothing?: None Help from another person to put on and taking off regular lower body clothing?:  A Little 6 Click Score: 20   End of Session    Activity Tolerance: Patient tolerated treatment well Patient left: in chair;with call bell/phone within reach;with family/visitor present;with nursing/sitter in room  OT Visit Diagnosis: Other abnormalities of gait and mobility (R26.89);Pain Pain - Right/Left: Left Pain - part of body: Knee;Shoulder                Time: 1039-1110 OT Time Calculation (min): 31 min Charges:  OT General Charges $OT Visit: 1 Visit OT Evaluation $OT Eval Moderate Complexity: 1 Mod OT Treatments $Self Care/Home Management : 23-37 mins  Hanley Hays, MPH, MS, OTR/L ascom [phone] 12/13/20, 2:56 PM

## 2020-12-13 NOTE — Op Note (Signed)
OPERATIVE NOTE  DATE OF SURGERY:  12/12/2020  PATIENT NAME:  Kelli Smith   DOB: June 11, 1941  MRN: [id number]  PRE-OPERATIVE DIAGNOSIS: Degenerative arthrosis of the left knee, primary  POST-OPERATIVE DIAGNOSIS:  Same  PROCEDURE:  Left total knee arthroplasty using computer-assisted navigation  SURGEON:  Marciano Sequin. M.D.  ASSISTANT:  Cassell Smiles, PA-C (present and scrubbed throughout the case, critical for assistance with exposure, retraction, instrumentation, and closure)  ANESTHESIA: spinal  ESTIMATED BLOOD LOSS: 50 mL  FLUIDS REPLACED: 1200 mL of crystalloid  TOURNIQUET TIME: 97 minutes  DRAINS: 2 medium Hemovac  SOFT TISSUE RELEASES: Anterior cruciate ligament, posterior cruciate ligament, deep medial collateral ligament, patellofemoral ligament  IMPLANTS UTILIZED: DePuy Attune size 5N posterior stabilized femoral component (cemented), size 3 rotating platform tibial component (cemented), 35 mm medialized dome patella (cemented), and a 5 mm stabilized rotating platform polyethylene insert.  INDICATIONS FOR SURGERY: Kelli Smith is a 80 y.o. year old female with a long history of progressive knee pain. X-rays demonstrated severe degenerative changes in tricompartmental fashion. The patient had not seen any significant improvement despite conservative nonsurgical intervention. After discussion of the risks and benefits of surgical intervention, the patient expressed understanding of the risks benefits and agree with plans for total knee arthroplasty.   The risks, benefits, and alternatives were discussed at length including but not limited to the risks of infection, bleeding, nerve injury, stiffness, blood clots, the need for revision surgery, cardiopulmonary complications, among others, and they were willing to proceed.  PROCEDURE IN DETAIL: The patient was brought into the operating room and, after adequate spinal anesthesia was achieved, a tourniquet was placed on the  patient's upper thigh. The patient's knee and leg were cleaned and prepped with alcohol and DuraPrep and draped in the usual sterile fashion. A "timeout" was performed as per usual protocol. The lower extremity was exsanguinated using an Esmarch, and the tourniquet was inflated to 300 mmHg. An anterior longitudinal incision was made followed by a standard mid vastus approach. The deep fibers of the medial collateral ligament were elevated in a subperiosteal fashion off of the medial flare of the tibia so as to maintain a continuous soft tissue sleeve. The patella was subluxed laterally and the patellofemoral ligament was incised. Inspection of the knee demonstrated severe degenerative changes with full-thickness loss of articular cartilage. Osteophytes were debrided using a rongeur. Anterior and posterior cruciate ligaments were excised. Two 4.0 mm Schanz pins were inserted in the femur and into the tibia for attachment of the array of trackers used for computer-assisted navigation. Hip center was identified using a circumduction technique. Distal landmarks were mapped using the computer. The distal femur and proximal tibia were mapped using the computer. The distal femoral cutting guide was positioned using computer-assisted navigation so as to achieve a 5 distal valgus cut. The femur was sized and it was felt that a size 5N femoral component was appropriate. A size 5 femoral cutting guide was positioned and the anterior cut was performed and verified using the computer. This was followed by completion of the posterior and chamfer cuts. Femoral cutting guide for the central box was then positioned in the center box cut was performed.  Attention was then directed to the proximal tibia. Medial and lateral menisci were excised. The extramedullary tibial cutting guide was positioned using computer-assisted navigation so as to achieve a 0 varus-valgus alignment and 3 posterior slope. The cut was performed and  verified using the computer. The proximal tibia was sized and  it was felt that a size 3 tibial tray was appropriate. Tibial and femoral trials were inserted followed by insertion of a 5 mm polyethylene insert. This allowed for excellent mediolateral soft tissue balancing both in flexion and in full extension. Finally, the patella was cut and prepared so as to accommodate a 35 mm medialized dome patella. A patella trial was placed and the knee was placed through a range of motion with excellent patellar tracking appreciated. The femoral trial was removed after debridement of posterior osteophytes. The central post-hole for the tibial component was reamed followed by insertion of a keel punch. Tibial trials were then removed. Cut surfaces of bone were irrigated with copious amounts of normal saline using pulsatile lavage and then suctioned dry. Polymethylmethacrylate cement was prepared in the usual fashion using a vacuum mixer. Cement was applied to the cut surface of the proximal tibia as well as along the undersurface of a size 3 rotating platform tibial component. Tibial component was positioned and impacted into place. Excess cement was removed using [employer] fast streamer. Cement was then applied to the cut surfaces of the femur as well as along the posterior flanges of the size 5N femoral component. The femoral component was positioned and impacted into place. Excess cement was removed using [employer] fast streamer. A 5 mm polyethylene trial was inserted and the knee was brought into full extension with steady axial compression applied. Finally, cement was applied to the backside of a 35 mm medialized dome patella and the patellar component was positioned and patellar clamp applied. Excess cement was removed using [employer] fast streamer. After adequate curing of the cement, the tourniquet was deflated after a total tourniquet time of 97 minutes. Hemostasis was achieved using electrocautery. The knee was irrigated with copious  amounts of normal saline using pulsatile lavage followed by 500 ml of Surgiphor and then suctioned dry. 20 mL of 1.3% Exparel and 60 mL of 0.25% Marcaine in 40 mL of normal saline was injected along the posterior capsule, medial and lateral gutters, and along the arthrotomy site. A 5 mm stabilized rotating platform polyethylene insert was inserted and the knee was placed through a range of motion with excellent mediolateral soft tissue balancing appreciated and excellent patellar tracking noted. 2 medium drains were placed in the wound bed and brought out through separate stab incisions. The medial parapatellar portion of the incision was reapproximated using interrupted sutures of #1 Vicryl. Subcutaneous tissue was approximated in layers using first #0 Vicryl followed #2-0 Vicryl. The skin was approximated with skin staples. A sterile dressing was applied.  The patient tolerated the procedure well and was transported to the recovery room in stable condition.    Raysean Graumann P. Holley Bouche., M.D.

## 2020-12-13 NOTE — Progress Notes (Addendum)
  Subjective: 1 Day Post-Op Procedure(s) (LRB): COMPUTER ASSISTED TOTAL KNEE ARTHROPLASTY (Left) Patient reports pain as well-controlled.   Patient is well, and has had no acute complaints or problems Plan is to go Home after hospital stay. Negative for chest pain and shortness of breath Fever: no Gastrointestinal: negative for nausea and vomiting.   Patient has not had a bowel movement.  Objective: Vital signs in last 24 hours: Temp:  [97.6 F (36.4 C)-98.3 F (36.8 C)] 97.6 F (36.4 C) (05/19 0507) Pulse Rate:  [70-97] 78 (05/19 0606) Resp:  [16-20] 17 (05/19 0507) BP: (99-151)/(55-81) 124/58 (05/19 0606) SpO2:  [93 %-100 %] 98 % (05/19 0507) Weight:  [60.3 kg] 60.3 kg (05/18 1126)  Intake/Output from previous day:  Intake/Output Summary (Last 24 hours) at 12/13/2020 0827 Last data filed at 12/13/2020 0634 Gross per 24 hour  Intake 2517.72 ml  Output 670 ml  Net 1847.72 ml    Intake/Output this shift: No intake/output data recorded.  Labs: No results for input(s): HGB in the last 72 hours. No results for input(s): WBC, RBC, HCT, PLT in the last 72 hours. No results for input(s): NA, K, CL, CO2, BUN, CREATININE, GLUCOSE, CALCIUM in the last 72 hours. No results for input(s): LABPT, INR in the last 72 hours.   EXAM General - Patient is Alert, Appropriate and Oriented Extremity - Neurovascular intact Dorsiflexion/Plantar flexion intact Compartment soft Dressing/Incision -Polar Care in place and working. , Hemovac in place. Post op dressing in place  Motor Function - intact, moving foot and toes well on exam.  Cardiovascular- Regular rate and rhythm, no murmurs/rubs/gallops Respiratory- Lungs clear to auscultation bilaterally Gastrointestinal- soft, nontender and active bowel sounds   Assessment/Plan: 1 Day Post-Op Procedure(s) (LRB): COMPUTER ASSISTED TOTAL KNEE ARTHROPLASTY (Left) Active Problems:   Total knee replacement status  Estimated body mass index is  23.55 kg/m as calculated from the following:   Height as of this encounter: 5\' 3"  (1.6 m).   Weight as of this encounter: 60.3 kg. Advance diet Up with therapy     DVT Prophylaxis - Xarelto, Ted hose and foot pumps Weight-Bearing as tolerated to left leg  Cassell Smiles, PA-C [facility] 12/13/2020, 8:27 AM

## 2020-12-13 NOTE — Care Management Important Message (Signed)
Important Message  Patient Details  Name: Kelli Smith MRN: [id number] Date of Birth: 1941-06-27   Medicare Important Message Given:  N/A - LOS <3 / Initial given by admissions  Initial Medicare IM reviewed with daughter, [name], by Thornton Dales, Patient Access Associate on 12/13/2020 at 9:40am.   Dannette Barbara 12/13/2020, 3:20 PM

## 2020-12-13 NOTE — Evaluation (Signed)
Physical Therapy Evaluation Patient Details Name: Kelli Smith MRN: [id number] DOB: [birth year] Today's Date: 12/13/2020   History of Present Illness  Pt admitted for L TKR. History includes DDD, Afib, and OA.  Clinical Impression  Pt is a pleasant 80 year old female who was admitted for L TKR. Pt is confused and poor historian. Pt performs transfers and ambulation with cga and RW. Reports no pain, however grimacing at end of session. Encouraged to reach out to RN if she wanted medication as she currently denies need. Poor safety awareness and general insight into reason for admission. Pt demonstrates deficits with strength/mobility/ROM. Would benefit from skilled PT to address above deficits and promote optimal return to PLOF. Recommend transition to [place] upon discharge from acute hospitalization.     Follow Up Recommendations Home health PT;Supervision/Assistance - 24 hour    Equipment Recommendations  Rolling walker with 5" wheels    Recommendations for Other Services       Precautions / Restrictions Precautions Precautions: Fall;Knee Precaution Booklet Issued: Yes (comment) Restrictions Weight Bearing Restrictions: Yes LLE Weight Bearing: Weight bearing as tolerated      Mobility  Bed Mobility               General bed mobility comments: not tested as received in recliner, however unable to give details on how she transferred between surfaces. She states "someone like you did it"    Transfers Overall transfer level: Needs assistance Equipment used: Rolling walker (2 wheeled) Transfers: Sit to/from Stand Sit to Stand: Min guard         General transfer comment: needs cues for pushing from seated surface. Once standing, good weight acceptance noted on surgical leg  Ambulation/Gait Ambulation/Gait assistance: Min guard Gait Distance (Feet): 60 Feet Assistive device: Rolling walker (2 wheeled) Gait Pattern/deviations: Step-to pattern     General Gait  Details: small step to gait pattern, asking to turn around multiple times, however still keeps ambulating in forward direction. Needs cues for RW sequencing, however grossly safe with light guard needed.  Stairs            Wheelchair Mobility    Modified Rankin (Stroke Patients Only)       Balance Overall balance assessment: Needs assistance Sitting-balance support: Feet supported Sitting balance-Leahy Scale: Good     Standing balance support: Bilateral upper extremity supported Standing balance-Leahy Scale: Good                               Pertinent Vitals/Pain Pain Assessment: No/denies pain    Home Living Family/patient expects to be discharged to:: Private residence Living Arrangements: Children Available Help at Discharge: Family;Available 24 hours/day Type of Home: House Home Access: Stairs to enter Entrance Stairs-Rails:  (pt is poor historian, unusre) Technical brewer of Steps: 1 Home Layout: Able to live on main level with bedroom/bathroom Home Equipment: Cane - single point;Bedside commode Additional Comments: pt is poor historian. History obtained from OT per conversation with daughter.    Prior Function Level of Independence: Independent with assistive device(s)         Comments: PRN use with SPC. Per patient, she wasn't using any AD PTA     Hand Dominance        Extremity/Trunk Assessment   Upper Extremity Assessment Upper Extremity Assessment: Defer to OT evaluation    Lower Extremity Assessment Lower Extremity Assessment: Generalized weakness (L LE grossly 3/5; R LE grossly 4/5)  Communication   Communication: No difficulties (appears confused)  Cognition Arousal/Alertness: Awake/alert Behavior During Therapy: WFL for tasks assessed/performed Overall Cognitive Status: No family/caregiver present to determine baseline cognitive functioning                                 General Comments: pt  seems confused to situation with poor insight and confused why therapist was there.      General Comments      Exercises Total Joint Exercises Goniometric ROM: L knee AROM: 0-90 degrees Other Exercises Other Exercises: seated ther-ex performed on L LE including AP, quad sets (has difficulty with technique), SLRs, hip abd/add, and knee flexion. 10 reps with min assist   Assessment/Plan    PT Assessment Patient needs continued PT services  PT Problem List Decreased strength;Decreased range of motion;Decreased mobility;Decreased cognition;Decreased knowledge of use of DME;Pain       PT Treatment Interventions DME instruction;Gait training;Stair training;Therapeutic exercise;Balance training    PT Goals (Current goals can be found in the Care Plan section)  Acute Rehab PT Goals Patient Stated Goal: to go home PT Goal Formulation: With patient Time For Goal Achievement: 12/27/20 Potential to Achieve Goals: Good    Frequency BID   Barriers to discharge        Co-evaluation               AM-PAC PT "6 Clicks" Mobility  Outcome Measure Help needed turning from your back to your side while in a flat bed without using bedrails?: A Little Help needed moving from lying on your back to sitting on the side of a flat bed without using bedrails?: A Little Help needed moving to and from a bed to a chair (including a wheelchair)?: A Little Help needed standing up from a chair using your arms (e.g., wheelchair or bedside chair)?: A Little Help needed to walk in hospital room?: A Little Help needed climbing 3-5 steps with a railing? : A Little 6 Click Score: 18    End of Session Equipment Utilized During Treatment: Gait belt Activity Tolerance: Patient tolerated treatment well Patient left: in chair (no chair alarm present, left door open) Nurse Communication: Mobility status PT Visit Diagnosis: Pain;Muscle weakness (generalized) (M62.81);Difficulty in walking, not elsewhere  classified (R26.2) Pain - Right/Left: Left Pain - part of body: Knee    Time: 3888-2800 PT Time Calculation (min) (ACUTE ONLY): 24 min   Charges:   PT Evaluation $PT Eval Low Complexity: 1 Low PT Treatments $Therapeutic Exercise: 8-22 mins        Kelli Smith, PT, DPT [phone]   Kelli Smith 12/13/2020, 1:20 PM

## 2020-12-13 NOTE — Progress Notes (Signed)
Met with the patient in the room to discuss DC plan and needs She stated that her adult daughter would be helping her at home, Her daughter is who provides transportation for her, she can afford her medications She stated that she has a 3 in 1 and a RW at home that she will use, She is set up with [facility] for [place] will continue to monitor for needs

## 2020-12-14 LAB — CBC
HCT: 33.7 % — ABNORMAL LOW (ref 36.0–46.0)
Hemoglobin: 10.8 g/dL — ABNORMAL LOW (ref 12.0–15.0)
MCH: 23.8 pg — ABNORMAL LOW (ref 26.0–34.0)
MCHC: 32 g/dL (ref 30.0–36.0)
MCV: 74.2 fL — ABNORMAL LOW (ref 80.0–100.0)
Platelets: 295 10*3/uL (ref 150–400)
RBC: 4.54 MIL/uL (ref 3.87–5.11)
RDW: 15.5 % (ref 11.5–15.5)
WBC: 16.2 10*3/uL — ABNORMAL HIGH (ref 4.0–10.5)
nRBC: 0 % (ref 0.0–0.2)

## 2020-12-14 MED ORDER — TRAMADOL HCL 50 MG PO TABS: 50.0000 mg | ORAL_TABLET | ORAL | 0 refills | Status: DC | PRN

## 2020-12-14 MED ORDER — CELECOXIB 200 MG PO CAPS: 200.0000 mg | ORAL_CAPSULE | Freq: Two times a day (BID) | ORAL | 0 refills | Status: DC

## 2020-12-14 NOTE — Discharge Summary (Signed)
Physician Discharge Summary  Patient ID: Kelli Smith MRN: [id number] DOB/AGE: 03/12/1941 80 y.o.  Admit date: 12/12/2020 Discharge date: 12/14/2020  Admission Diagnoses:  Total knee replacement status [Z96.659]  Surgeries:Procedure(s): Left total knee arthroplasty using computer-assisted navigation  SURGEON:  Marciano Sequin. M.D.  ASSISTANT:  Cassell Smiles, PA-C (present and scrubbed throughout the case, critical for assistance with exposure, retraction, instrumentation, and closure)  ANESTHESIA: spinal  ESTIMATED BLOOD LOSS: 50 mL  FLUIDS REPLACED: 1200 mL of crystalloid  TOURNIQUET TIME: 97 minutes  DRAINS: 2 medium Hemovac  SOFT TISSUE RELEASES: Anterior cruciate ligament, posterior cruciate ligament, deep medial collateral ligament, patellofemoral ligament  IMPLANTS UTILIZED: DePuy Attune size 5N posterior stabilized femoral component (cemented), size 3 rotating platform tibial component (cemented), 35 mm medialized dome patella (cemented), and a 5 mm stabilized rotating platform polyethylene insert.   Discharge Diagnoses: Patient Active Problem List   Diagnosis Date Noted  . 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  . 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 Cyst of right kidney 11/22/2020  . Aortic atherosclerosis ([place]) 11/22/2020  . CAD (coronary artery disease) 11/22/2020  . Adrenal adenoma, left 11/22/2020  . Arthritis of lumbar spine 11/22/2020  . Primary osteoarthritis of both knees 09/30/2020  . DDD (degenerative disc disease), lumbar 04/05/2020  . Pain of right heel 04/05/2020  . Hematuria 04/05/2020  . Elevated BP without diagnosis of hypertension 04/05/2020  . Lumbar radiculopathy 04/05/2020  . Premature atrial contraction 09/20/2019  . Atrial fibrillation ([place]) 09/08/2019  . Basal cell carcinoma 06/20/2019  . Osteopenia 12/28/2018  . Microcytosis 09/15/2018  . Osteoarthritis 06/16/2018  . Right shoulder pain 06/11/2018  . Knee pain, bilateral  06/11/2018  . Memory loss 06/11/2018    Past Medical History:  Diagnosis Date  . A-fib ([place])   . Anemia   . Aortic atherosclerosis ([place])   . Arthritis    knees, right shoulder   . CAD (coronary artery disease)   . DDD (degenerative disc disease), lumbar   . History of chicken pox   . History of kidney stones    H/O  . PAC (premature atrial contraction)   . UTI (urinary tract infection)      Transfusion:    Consultants (if any):   Discharged Condition: Improved  Hospital Course: [name] is an 80 y.o. female who was admitted 12/12/2020 with a diagnosis of left knee osteoarthritis and went to the operating room on 12/12/2020 and underwent left total knee arthoplasty. The patient received perioperative antibiotics for prophylaxis (see below). The patient tolerated the procedure well and was transported to PACU in stable condition. After meeting PACU criteria, the patient was subsequently transferred to the Orthopaedics/Rehabilitation unit.   The patient received DVT prophylaxis in the form of early mobilization, Xarelto, Foot Pumps and TED hose. A sacral pad had been placed and heels were elevated off of the bed with rolled towels in order to protect skin integrity. Foley catheter was discontinued on postoperative day #0. Wound drains were discontinued on postoperative day #2. The surgical incision was healing well without signs of infection.  Physical therapy was initiated postoperatively for transfers, gait training, and strengthening. Occupational therapy was initiated for activities of daily living and evaluation for assisted devices. Rehabilitation goals were reviewed in detail with the patient. The patient made steady progress with physical therapy and physical therapy recommended discharge to Home.   The patient achieved the preliminary goals of this hospitalization and was felt to be medically and orthopaedically appropriate for discharge.  She was  given perioperative  antibiotics:  Anti-infectives (From admission, onward)   Start     Dose/Rate Route Frequency Ordered Stop   12/12/20 1900  ceFAZolin (ANCEF) IVPB 2g/100 mL premix        2 g 200 mL/hr over 30 Minutes Intravenous Every 6 hours 12/12/20 1750 12/13/20 0423   12/12/20 0600  ceFAZolin (ANCEF) IVPB 2g/100 mL premix        2 g 200 mL/hr over 30 Minutes Intravenous On call to O.R. 12/12/20 0146 12/12/20 1305    .  Recent vital signs:  Vitals:   12/14/20 0603 12/14/20 0803  BP: (!) 146/82 (!) 141/74  Pulse: 93 98  Resp:  16  Temp:  98.1 F (36.7 C)  SpO2:      Recent laboratory studies:  Recent Labs    12/14/20 0511  WBC 16.2*  HGB 10.8*  HCT 33.7*  PLT 295    Diagnostic Studies: CT Chest W Contrast  Result Date: 11/22/2020 CLINICAL DATA:  Unintended 20 pound weight loss over 3 months. EXAM: CT CHEST, ABDOMEN, AND PELVIS WITH CONTRAST TECHNIQUE: Multidetector CT imaging of the chest, abdomen and pelvis was performed following the standard protocol during bolus administration of intravenous contrast. CONTRAST:  75 mL OMNIPAQUE IOHEXOL 300 MG/ML  SOLN COMPARISON:  CT abdomen and pelvis 07/08/2019. FINDINGS: CT CHEST FINDINGS Cardiovascular: Small to moderate pericardial effusion. There is cardiomegaly. Calcific aortic and coronary atherosclerosis is noted. There is some calcification of the mitral valve. Mediastinum/Nodes: The thyroid gland is markedly enlarged. A low attenuating lesion in the left lobe of the thyroid measures 4 cm AP x 2.3 cm transverse. There is also heterogeneous lesion in the right lobe of the thyroid measuring 3.4 cm AP x 2.3 cm transverse. The thyroid extends inferiorly into the mediastinum anterior to the trachea where a hypoattenuating lesion measuring 3 cm transverse x 2.4 cm AP is identified. There is no lymphadenopathy. The esophagus is unremarkable. Lungs/Pleura: Trace pleural effusions are present process. Or worrisome no consolidative pulmonary nodule. Punctate  calcified granuloma right upper lobe noted. Musculoskeletal: No acute or focal abnormality. CT ABDOMEN PELVIS FINDINGS Hepatobiliary: No focal liver abnormality is seen. No gallstones, gallbladder wall thickening, or biliary dilatation. Pancreas: Unremarkable. No pancreatic ductal dilatation or surrounding inflammatory changes. Spleen: Normal in size without focal abnormality. Adrenals/Urinary Tract: Small left adrenal adenoma is unchanged. The right adrenal gland appears normal. 2 right renal cysts are unchanged. The kidneys are otherwise unremarkable. Ureters and urinary bladder appear normal. Stomach/Bowel: Stomach is within normal limits. Appendix appears normal. No evidence of bowel wall thickening, distention, or inflammatory changes. Vascular/Lymphatic: Aortic atherosclerosis. No enlarged abdominal or pelvic lymph nodes. Reproductive: Uterus and bilateral adnexa are unremarkable. Other: None. Musculoskeletal: No acute or focal abnormality. The patient has convex left lumbar scoliosis and multilevel degenerative disease. Chronic bilateral L5 pars interarticularis defects result in 0.9 cm anterolisthesis L5 on S1. IMPRESSION: No acute abnormality chest, abdomen or pelvis. Marked thyromegaly with large heterogeneous lesions seen in both lobes. Recommend thyroid US. (Ref: J Am Coll Radiol. 2015 Feb;12(2): 143-50). Cardiomegaly. Small to moderate pericardial effusion and trace bilateral pleural effusions. Aortic Atherosclerosis (ICD10-I70.0). Calcific coronary artery disease also noted. Electronically Signed   By: Inge Rise M.D.   On: 11/22/2020 10:42   CT Abdomen Pelvis W Contrast  Result Date: 11/22/2020 CLINICAL DATA:  Unintended 20 pound weight loss over 3 months. EXAM: CT CHEST, ABDOMEN, AND PELVIS WITH CONTRAST TECHNIQUE: Multidetector CT imaging of the chest, abdomen and pelvis was  performed following the standard protocol during bolus administration of intravenous contrast. CONTRAST:  75 mL  OMNIPAQUE IOHEXOL 300 MG/ML  SOLN COMPARISON:  CT abdomen and pelvis 07/08/2019. FINDINGS: CT CHEST FINDINGS Cardiovascular: Small to moderate pericardial effusion. There is cardiomegaly. Calcific aortic and coronary atherosclerosis is noted. There is some calcification of the mitral valve. Mediastinum/Nodes: The thyroid gland is markedly enlarged. A low attenuating lesion in the left lobe of the thyroid measures 4 cm AP x 2.3 cm transverse. There is also heterogeneous lesion in the right lobe of the thyroid measuring 3.4 cm AP x 2.3 cm transverse. The thyroid extends inferiorly into the mediastinum anterior to the trachea where a hypoattenuating lesion measuring 3 cm transverse x 2.4 cm AP is identified. There is no lymphadenopathy. The esophagus is unremarkable. Lungs/Pleura: Trace pleural effusions are present process. Or worrisome no consolidative pulmonary nodule. Punctate calcified granuloma right upper lobe noted. Musculoskeletal: No acute or focal abnormality. CT ABDOMEN PELVIS FINDINGS Hepatobiliary: No focal liver abnormality is seen. No gallstones, gallbladder wall thickening, or biliary dilatation. Pancreas: Unremarkable. No pancreatic ductal dilatation or surrounding inflammatory changes. Spleen: Normal in size without focal abnormality. Adrenals/Urinary Tract: Small left adrenal adenoma is unchanged. The right adrenal gland appears normal. 2 right renal cysts are unchanged. The kidneys are otherwise unremarkable. Ureters and urinary bladder appear normal. Stomach/Bowel: Stomach is within normal limits. Appendix appears normal. No evidence of bowel wall thickening, distention, or inflammatory changes. Vascular/Lymphatic: Aortic atherosclerosis. No enlarged abdominal or pelvic lymph nodes. Reproductive: Uterus and bilateral adnexa are unremarkable. Other: None. Musculoskeletal: No acute or focal abnormality. The patient has convex left lumbar scoliosis and multilevel degenerative disease. Chronic  bilateral L5 pars interarticularis defects result in 0.9 cm anterolisthesis L5 on S1. IMPRESSION: No acute abnormality chest, abdomen or pelvis. Marked thyromegaly with large heterogeneous lesions seen in both lobes. Recommend thyroid US. (Ref: J Am Coll Radiol. 2015 Feb;12(2): 143-50). Cardiomegaly. Small to moderate pericardial effusion and trace bilateral pleural effusions. Aortic Atherosclerosis (ICD10-I70.0). Calcific coronary artery disease also noted. Electronically Signed   By: Inge Rise M.D.   On: 11/22/2020 10:42   DG Knee Left Port  Result Date: 12/12/2020 CLINICAL DATA:  Postop EXAM: PORTABLE LEFT KNEE - 1-2 VIEW COMPARISON:  08/08/2020 FINDINGS: Interval left knee replacement with intact hardware and normal alignment. No fracture is seen. Gas in the soft tissues consistent with recent surgery. Suprapatellar drainage catheter IMPRESSION: Interval left knee replacement with expected postsurgical change Electronically Signed   By: Donavan Foil M.D.   On: 12/12/2020 17:28    Discharge Medications:   Allergies as of 12/14/2020   No Known Allergies     Medication List    TAKE these medications   amiodarone 200 MG tablet Commonly known as: Pacerone Take 1 tablet (200 mg total) by mouth daily. Take 400mg  twice daily x 1 week, then 200mg  bid What changed:   when to take this  additional instructions   B Complex-C-Folic Acid Tabs Take 1 tablet by mouth daily.   CALCIUM 1200+D3 PO Take 1 tablet by mouth daily.   celecoxib 200 MG capsule Commonly known as: CELEBREX Take 1 capsule (200 mg total) by mouth 2 (two) times daily.   cholecalciferol 25 MCG (1000 UNIT) tablet Commonly known as: VITAMIN D3 Take 1,000 Units by mouth daily.   Fish Oil 1200 MG Cpdr Take 1 capsule by mouth daily.   furosemide 20 MG tablet Commonly known as: LASIX Take 1 tablet (20 mg total) by mouth daily.  In am   Iron 325 (65 Fe) MG Tabs Take 325 mg by mouth daily.   multivitamin with  minerals tablet Take 1 tablet by mouth daily.   OVER THE COUNTER MEDICATION Take 1 tablet by mouth daily. COGNIUM MEMORY   rivaroxaban 20 MG Tabs tablet Commonly known as: XARELTO Take 1 tablet (20 mg total) by mouth daily with supper. What changed: when to take this   traMADol 50 MG tablet Commonly known as: ULTRAM Take 1 tablet (50 mg total) by mouth every 4 (four) hours as needed for moderate pain.   vitamin B-12 100 MCG tablet Commonly known as: CYANOCOBALAMIN Take 100 mcg by mouth daily.            Durable Medical Equipment  (From admission, onward)         Start     Ordered   12/12/20 1750  DME Walker rolling  Once       Question:  Patient needs a walker to treat with the following condition  Answer:  Total knee replacement status   12/12/20 1750   12/12/20 1750  DME Bedside commode  Once       Question:  Patient needs a bedside commode to treat with the following condition  Answer:  Total knee replacement status   12/12/20 1750          Disposition: Home with home health PT     Follow-up Information    [facility] On 12/27/2020.   Specialty: Orthopedic Surgery Why: at 1:15pm Contact information: [place] [address] [phone]        Dereck Leep, MD On 01/29/2021.   Specialty: Orthopedic Surgery Why: at 3:00pm Contact information: [place] [address] [place], PA-C 12/14/2020, 10:52 AM

## 2020-12-14 NOTE — Progress Notes (Signed)
Pt d/c home via [place] with daughter Kathlee Nations. D/c paperwork was reviewed with pt and daughter and they both expressed understanding.  TEDs were placed prior to d/c and pt went home with two additional dressings, bone foam, IS and polar care.  Follow up appts made and pt aware.  NAD noted at time of d/c VSS.  IV removed from pts R forearm without issue.  All belongings taken.  Pt wheeled to medical mall ent and assisted into car. DME was given to daughter.

## 2020-12-14 NOTE — Progress Notes (Signed)
Occupational Therapy Treatment Patient Details Name: Kelli Smith MRN: [id number] DOB: 07-31-1940 Today's Date: 12/14/2020    History of present illness Pt admitted for L TKR. History includes DDD, Afib, and OA.   OT comments  Pt seen for OT tx this date. Dtr present. Supervision for bed mobility, CGA for STS transfer with RW, and supervision for toileting via [facility] with set up for hygiene items. Pt endorsing mild pain. Review of previous OT instruction provided to ensure pt/dtr confidence in home set up and plan for ADL routines at home during recovery. Both verbalized understanding. Pt continues to benefit from skilled OT services. Progressing well towards OT goals.   Follow Up Recommendations  Home health OT    Equipment Recommendations  None recommended by OT    Recommendations for Other Services      Precautions / Restrictions Precautions Precautions: Fall;Knee Restrictions Weight Bearing Restrictions: Yes LLE Weight Bearing: Weight bearing as tolerated       Mobility Bed Mobility Overal bed mobility: Needs Assistance Bed Mobility: Supine to Sit     Supine to sit: Supervision     General bed mobility comments: safe technique    Transfers Overall transfer level: Needs assistance Equipment used: Rolling walker (2 wheeled) Transfers: Sit to/from Stand Sit to Stand: Min guard              Balance Overall balance assessment: Needs assistance Sitting-balance support: Feet supported Sitting balance-Leahy Scale: Good     Standing balance support: Single extremity supported;During functional activity Standing balance-Leahy Scale: Good                             ADL either performed or assessed with clinical judgement   ADL Overall ADL's : Needs assistance/impaired                         Toilet Transfer: Ambulation;Supervision/safety;BSC;RW   Toileting- Clothing Manipulation and Hygiene: Set up;Supervision/safety;Sit to/from  stand;Sitting/lateral lean               Vision Patient Visual Report: No change from baseline     Perception     Praxis      Cognition Arousal/Alertness: Awake/alert Behavior During Therapy: WFL for tasks assessed/performed Overall Cognitive Status: Within Functional Limits for tasks assessed                                 General Comments: follows commands        Exercises Other Exercises Other Exercises: OT provided review of  s/p TKA OT instruction Other Exercises: Pt performed toileting via BSC with RW with supervision   Shoulder Instructions       General Comments      Pertinent Vitals/ Pain       Pain Assessment: 0-10 Pain Score: 4  Pain Location: L knee Pain Descriptors / Indicators: Operative site guarding;Aching Pain Intervention(s): Monitored during session;Limited activity within patient's tolerance;Repositioned;Ice applied  Home Living                                          Prior Functioning/Environment              Frequency  Min 1X/week        Progress Toward Goals  OT Goals(current goals can now be found in the care plan section)  Progress towards OT goals: Progressing toward goals  Acute Rehab OT Goals Patient Stated Goal: to go home OT Goal Formulation: With patient/family Time For Goal Achievement: 12/27/20 Potential to Achieve Goals: Good  Plan Discharge plan remains appropriate;Frequency remains appropriate    Co-evaluation                 AM-PAC OT "6 Clicks" Daily Activity     Outcome Measure   Help from another person eating meals?: None Help from another person taking care of personal grooming?: A Little Help from another person toileting, which includes using toliet, bedpan, or urinal?: A Little Help from another person bathing (including washing, rinsing, drying)?: A Little Help from another person to put on and taking off regular upper body clothing?: None Help from  another person to put on and taking off regular lower body clothing?: A Little 6 Click Score: 20    End of Session Equipment Utilized During Treatment: Gait belt;Rolling walker  OT Visit Diagnosis: Other abnormalities of gait and mobility (R26.89);Pain Pain - Right/Left: Left Pain - part of body: Knee   Activity Tolerance Patient tolerated treatment well   Patient Left in chair;with call bell/phone within reach;with chair alarm set;with family/visitor present   Nurse Communication          Time: 1610-9604 OT Time Calculation (min): 26 min  Charges: OT General Charges $OT Visit: 1 Visit OT Treatments $Self Care/Home Management : 23-37 mins  Hanley Hays, MPH, MS, OTR/L ascom [phone] 12/14/20, 11:53 AM

## 2020-12-14 NOTE — Progress Notes (Signed)
Physical Therapy Treatment Patient Details Name: Kelli Smith MRN: [id number] DOB: Dec 10, 1940 Today's Date: 12/14/2020    History of Present Illness Pt admitted for L TKR. History includes DDD, Afib, and OA.    PT Comments    Pt is making great progress and has reached her PT goals. Daughter in room throughout session and educated on polar care system and home supervision. Good progress towards ROM goals with minimal pain noted. Good endurance with HEP, however needs frequent cues for carryover. Improved posture with use of AD this date. Message sent to care team, discharge order present in chart. Will sign off.   Follow Up Recommendations  Home health PT;Supervision/Assistance - 24 hour     Equipment Recommendations  Rolling walker with 5" wheels    Recommendations for Other Services       Precautions / Restrictions Precautions Precautions: Fall;Knee Precaution Booklet Issued: Yes (comment) Restrictions Weight Bearing Restrictions: Yes LLE Weight Bearing: Weight bearing as tolerated    Mobility  Bed Mobility Overal bed mobility: Needs Assistance Bed Mobility: Supine to Sit     Supine to sit: Supervision     General bed mobility comments: pt received up in recliner, not performed    Transfers Overall transfer level: Needs assistance Equipment used: Rolling walker (2 wheeled) Transfers: Sit to/from Stand Sit to Stand: Min guard         General transfer comment: cues for hand placement. Once standing, upright posture noted. Improved ease but decreased carry over, still requires cues for sequencing.  Ambulation/Gait Ambulation/Gait assistance: Supervision Gait Distance (Feet): 200 Feet Assistive device: Rolling walker (2 wheeled) Gait Pattern/deviations: Step-through pattern     General Gait Details: improved technique from previous session with consistent reciprocal gait pattern. Cues for posture and symmetrical step length. 2 standing rest break  required   Stairs Stairs: Yes Stairs assistance: Min guard Stair Management: No rails Number of Stairs: 1 General stair comments: therapist demonstrated prior to performance. Performed stair training twice, demonstrating safe technique. Pt plans to live on main level once in home   Wheelchair Mobility    Modified Rankin (Stroke Patients Only)       Balance Overall balance assessment: Needs assistance Sitting-balance support: Feet supported Sitting balance-Leahy Scale: Good     Standing balance support: Single extremity supported;During functional activity Standing balance-Leahy Scale: Good                              Cognition Arousal/Alertness: Awake/alert Behavior During Therapy: WFL for tasks assessed/performed Overall Cognitive Status: Within Functional Limits for tasks assessed                                 General Comments: follows commands      Exercises Total Joint Exercises Goniometric ROM: L knee AAROM: 0-94 degrees Other Exercises Other Exercises: seated ther-ex performed on L LE including AP, quad sets, SLRs, hip abd/add, and SAQ. All ther-ex performed x 12 reps with cues for sequencing. Written HEP reviewed with daughter Other Exercises: OT provided review of  s/p TKA OT instruction Other Exercises: Pt performed toileting via [place] with RW with supervision    General Comments        Pertinent Vitals/Pain Pain Assessment: 0-10 Pain Score: 3  Pain Location: L knee Pain Descriptors / Indicators: Operative site guarding;Aching Pain Intervention(s): Limited activity within patient's tolerance;Ice applied;Patient requesting pain meds-RN  notified    Home Living                      Prior Function            PT Goals (current goals can now be found in the care plan section) Acute Rehab PT Goals Patient Stated Goal: to go home PT Goal Formulation: With patient Time For Goal Achievement: 12/27/20 Potential to  Achieve Goals: Good Progress towards PT goals: Progressing toward goals    Frequency    BID      PT Plan Current plan remains appropriate    Co-evaluation              AM-PAC PT "6 Clicks" Mobility   Outcome Measure  Help needed turning from your back to your side while in a flat bed without using bedrails?: A Little Help needed moving from lying on your back to sitting on the side of a flat bed without using bedrails?: A Little Help needed moving to and from a bed to a chair (including a wheelchair)?: A Little Help needed standing up from a chair using your arms (e.g., wheelchair or bedside chair)?: A Little Help needed to walk in hospital room?: A Little Help needed climbing 3-5 steps with a railing? : A Little 6 Click Score: 18    End of Session Equipment Utilized During Treatment: Gait belt Activity Tolerance: Patient tolerated treatment well Patient left: in chair;with chair alarm set Nurse Communication: Mobility status PT Visit Diagnosis: Pain;Muscle weakness (generalized) (M62.81);Difficulty in walking, not elsewhere classified (R26.2) Pain - Right/Left: Left Pain - part of body: Knee     Time: 0916-1000 PT Time Calculation (min) (ACUTE ONLY): 44 min  Charges:  $Gait Training: 23-37 mins $Therapeutic Exercise: 8-22 mins                     Greggory Stallion, PT, DPT [phone]    Kelli Smith 12/14/2020, 12:10 PM

## 2020-12-14 NOTE — Progress Notes (Signed)
  Subjective: 2 Days Post-Op Procedure(s) (LRB): COMPUTER ASSISTED TOTAL KNEE ARTHROPLASTY (Left) Patient reports pain as well-controlled.   Patient is well, and has had no acute complaints or problems Plan is to go Home after hospital stay. Negative for chest pain and shortness of breath Fever: no Gastrointestinal: negative for nausea and vomiting.  Patient has not had a bowel movement.  Objective: Vital signs in last 24 hours: Temp:  [97.5 F (36.4 C)-98.7 F (37.1 C)] 98.7 F (37.1 C) (05/19 2332) Pulse Rate:  [81-93] 93 (05/20 0603) Resp:  [16-18] 17 (05/19 2332) BP: (97-146)/(51-82) 146/82 (05/20 0603) SpO2:  [94 %-100 %] 100 % (05/19 2332) Weight:  [64 kg] 64 kg (05/19 1500)  Intake/Output from previous day:  Intake/Output Summary (Last 24 hours) at 12/14/2020 0721 Last data filed at 12/14/2020 0511 Gross per 24 hour  Intake 360 ml  Output 450 ml  Net -90 ml    Intake/Output this shift: No intake/output data recorded.  Labs: Recent Labs    12/14/20 0511  HGB 10.8*   Recent Labs    12/14/20 0511  WBC 16.2*  RBC 4.54  HCT 33.7*  PLT 295   No results for input(s): NA, K, CL, CO2, BUN, CREATININE, GLUCOSE, CALCIUM in the last 72 hours. No results for input(s): LABPT, INR in the last 72 hours.   EXAM General - Patient is Alert and Lacking orientation to time, but oriented to place/person Extremity - Neurovascular intact Dorsiflexion/Plantar flexion intact Compartment soft Dressing/Incision -Postoperative dressing remains in place., Polar Care in place and working. , Hemovac in place. , Following removal of post-op dressing, mild dried blood noted Motor Function - intact, moving foot and toes well on exam.  Cardiovascular- slightly tachycardic, regular rate and rhythm Respiratory- Lungs clear to auscultation bilaterally Gastrointestinal- soft, nontender and active bowel sounds   Assessment/Plan: 2 Days Post-Op Procedure(s) (LRB): COMPUTER ASSISTED TOTAL  KNEE ARTHROPLASTY (Left) Active Problems:   Total knee replacement status  Estimated body mass index is 24.99 kg/m as calculated from the following:   Height as of this encounter: 5\' 3"  (1.6 m).   Weight as of this encounter: 64 kg. Advance diet Up with therapy Discharge home with home health pending PT clearance and BM    Post-op dressing removed. , Hemovac removed., Mini compression dressing applied.  and Fresh honeycomb dressing applied.   DVT Prophylaxis - Xarelto, Ted hose and foot pumps Weight-Bearing as tolerated to left leg  Cassell Smiles, PA-C [facility] 12/14/2020, 7:21 AM

## 2020-12-15 DIAGNOSIS — E010 Iodine-deficiency related diffuse (endemic) goiter: Secondary | ICD-10-CM | POA: Diagnosis not present

## 2020-12-15 DIAGNOSIS — I4891 Unspecified atrial fibrillation: Secondary | ICD-10-CM | POA: Diagnosis not present

## 2020-12-15 DIAGNOSIS — D630 Anemia in neoplastic disease: Secondary | ICD-10-CM | POA: Diagnosis not present

## 2020-12-15 DIAGNOSIS — M19011 Primary osteoarthritis, right shoulder: Secondary | ICD-10-CM | POA: Diagnosis not present

## 2020-12-15 DIAGNOSIS — I10 Essential (primary) hypertension: Secondary | ICD-10-CM | POA: Diagnosis not present

## 2020-12-15 DIAGNOSIS — I2510 Atherosclerotic heart disease of native coronary artery without angina pectoris: Secondary | ICD-10-CM | POA: Diagnosis not present

## 2020-12-15 DIAGNOSIS — M5116 Intervertebral disc disorders with radiculopathy, lumbar region: Secondary | ICD-10-CM | POA: Diagnosis not present

## 2020-12-15 DIAGNOSIS — Z7901 Long term (current) use of anticoagulants: Secondary | ICD-10-CM | POA: Diagnosis not present

## 2020-12-15 DIAGNOSIS — Z471 Aftercare following joint replacement surgery: Secondary | ICD-10-CM | POA: Diagnosis not present

## 2020-12-15 DIAGNOSIS — Z96652 Presence of left artificial knee joint: Secondary | ICD-10-CM | POA: Diagnosis not present

## 2020-12-15 DIAGNOSIS — M4726 Other spondylosis with radiculopathy, lumbar region: Secondary | ICD-10-CM | POA: Diagnosis not present

## 2020-12-15 DIAGNOSIS — Z85828 Personal history of other malignant neoplasm of skin: Secondary | ICD-10-CM | POA: Diagnosis not present

## 2020-12-15 DIAGNOSIS — F0390 Unspecified dementia without behavioral disturbance: Secondary | ICD-10-CM | POA: Diagnosis not present

## 2020-12-15 DIAGNOSIS — M8580 Other specified disorders of bone density and structure, unspecified site: Secondary | ICD-10-CM | POA: Diagnosis not present

## 2020-12-15 DIAGNOSIS — I700 Atherosclerosis of aorta: Secondary | ICD-10-CM | POA: Diagnosis not present

## 2020-12-15 DIAGNOSIS — M1711 Unilateral primary osteoarthritis, right knee: Secondary | ICD-10-CM | POA: Diagnosis not present

## 2020-12-15 DIAGNOSIS — D3500 Benign neoplasm of unspecified adrenal gland: Secondary | ICD-10-CM | POA: Diagnosis not present

## 2020-12-15 DIAGNOSIS — N281 Cyst of kidney, acquired: Secondary | ICD-10-CM | POA: Diagnosis not present

## 2020-12-17 DIAGNOSIS — Z471 Aftercare following joint replacement surgery: Secondary | ICD-10-CM | POA: Diagnosis not present

## 2020-12-17 DIAGNOSIS — I4891 Unspecified atrial fibrillation: Secondary | ICD-10-CM | POA: Diagnosis not present

## 2020-12-17 DIAGNOSIS — M19011 Primary osteoarthritis, right shoulder: Secondary | ICD-10-CM | POA: Diagnosis not present

## 2020-12-17 DIAGNOSIS — M1711 Unilateral primary osteoarthritis, right knee: Secondary | ICD-10-CM | POA: Diagnosis not present

## 2020-12-17 DIAGNOSIS — I10 Essential (primary) hypertension: Secondary | ICD-10-CM | POA: Diagnosis not present

## 2020-12-17 DIAGNOSIS — I2510 Atherosclerotic heart disease of native coronary artery without angina pectoris: Secondary | ICD-10-CM | POA: Diagnosis not present

## 2020-12-19 DIAGNOSIS — Z471 Aftercare following joint replacement surgery: Secondary | ICD-10-CM | POA: Diagnosis not present

## 2020-12-19 DIAGNOSIS — I4891 Unspecified atrial fibrillation: Secondary | ICD-10-CM | POA: Diagnosis not present

## 2020-12-19 DIAGNOSIS — I2510 Atherosclerotic heart disease of native coronary artery without angina pectoris: Secondary | ICD-10-CM | POA: Diagnosis not present

## 2020-12-19 DIAGNOSIS — M1711 Unilateral primary osteoarthritis, right knee: Secondary | ICD-10-CM | POA: Diagnosis not present

## 2020-12-19 DIAGNOSIS — M19011 Primary osteoarthritis, right shoulder: Secondary | ICD-10-CM | POA: Diagnosis not present

## 2020-12-19 DIAGNOSIS — I10 Essential (primary) hypertension: Secondary | ICD-10-CM | POA: Diagnosis not present

## 2020-12-21 DIAGNOSIS — M1711 Unilateral primary osteoarthritis, right knee: Secondary | ICD-10-CM | POA: Diagnosis not present

## 2020-12-21 DIAGNOSIS — I2510 Atherosclerotic heart disease of native coronary artery without angina pectoris: Secondary | ICD-10-CM | POA: Diagnosis not present

## 2020-12-21 DIAGNOSIS — I10 Essential (primary) hypertension: Secondary | ICD-10-CM | POA: Diagnosis not present

## 2020-12-21 DIAGNOSIS — I4891 Unspecified atrial fibrillation: Secondary | ICD-10-CM | POA: Diagnosis not present

## 2020-12-21 DIAGNOSIS — Z471 Aftercare following joint replacement surgery: Secondary | ICD-10-CM | POA: Diagnosis not present

## 2020-12-21 DIAGNOSIS — M19011 Primary osteoarthritis, right shoulder: Secondary | ICD-10-CM | POA: Diagnosis not present

## 2020-12-24 DIAGNOSIS — I2510 Atherosclerotic heart disease of native coronary artery without angina pectoris: Secondary | ICD-10-CM | POA: Diagnosis not present

## 2020-12-24 DIAGNOSIS — M1711 Unilateral primary osteoarthritis, right knee: Secondary | ICD-10-CM | POA: Diagnosis not present

## 2020-12-24 DIAGNOSIS — I10 Essential (primary) hypertension: Secondary | ICD-10-CM | POA: Diagnosis not present

## 2020-12-24 DIAGNOSIS — Z471 Aftercare following joint replacement surgery: Secondary | ICD-10-CM | POA: Diagnosis not present

## 2020-12-24 DIAGNOSIS — M19011 Primary osteoarthritis, right shoulder: Secondary | ICD-10-CM | POA: Diagnosis not present

## 2020-12-24 DIAGNOSIS — I4891 Unspecified atrial fibrillation: Secondary | ICD-10-CM | POA: Diagnosis not present

## 2020-12-26 DIAGNOSIS — I10 Essential (primary) hypertension: Secondary | ICD-10-CM | POA: Diagnosis not present

## 2020-12-26 DIAGNOSIS — Z471 Aftercare following joint replacement surgery: Secondary | ICD-10-CM | POA: Diagnosis not present

## 2020-12-26 DIAGNOSIS — M19011 Primary osteoarthritis, right shoulder: Secondary | ICD-10-CM | POA: Diagnosis not present

## 2020-12-26 DIAGNOSIS — M1711 Unilateral primary osteoarthritis, right knee: Secondary | ICD-10-CM | POA: Diagnosis not present

## 2020-12-26 DIAGNOSIS — I2510 Atherosclerotic heart disease of native coronary artery without angina pectoris: Secondary | ICD-10-CM | POA: Diagnosis not present

## 2020-12-26 DIAGNOSIS — I4891 Unspecified atrial fibrillation: Secondary | ICD-10-CM | POA: Diagnosis not present

## 2020-12-27 DIAGNOSIS — Z96652 Presence of left artificial knee joint: Secondary | ICD-10-CM | POA: Diagnosis not present

## 2020-12-27 DIAGNOSIS — S76112A Strain of left quadriceps muscle, fascia and tendon, initial encounter: Secondary | ICD-10-CM | POA: Diagnosis not present

## 2020-12-27 DIAGNOSIS — M25562 Pain in left knee: Secondary | ICD-10-CM | POA: Diagnosis not present

## 2020-12-27 DIAGNOSIS — M1712 Unilateral primary osteoarthritis, left knee: Secondary | ICD-10-CM | POA: Diagnosis not present

## 2020-12-27 DIAGNOSIS — M25662 Stiffness of left knee, not elsewhere classified: Secondary | ICD-10-CM | POA: Diagnosis not present

## 2020-12-27 DIAGNOSIS — S76912A Strain of unspecified muscles, fascia and tendons at thigh level, left thigh, initial encounter: Secondary | ICD-10-CM | POA: Diagnosis not present

## 2020-12-27 DIAGNOSIS — M6281 Muscle weakness (generalized): Secondary | ICD-10-CM | POA: Diagnosis not present

## 2020-12-27 DIAGNOSIS — G8929 Other chronic pain: Secondary | ICD-10-CM | POA: Diagnosis not present

## 2020-12-28 ENCOUNTER — Ambulatory Visit
Admission: RE | Admit: 2020-12-28 | Discharge: 2020-12-28 | Disposition: A | Discharge status: Home / Self Care | Payer: Medicare Other | Source: Ambulatory Visit | Attending: Internal Medicine | Admitting: Internal Medicine

## 2020-12-28 ENCOUNTER — Other Ambulatory Visit: Payer: Self-pay

## 2020-12-28 DIAGNOSIS — E010 Iodine-deficiency related diffuse (endemic) goiter: Secondary | ICD-10-CM | POA: Diagnosis not present

## 2020-12-28 DIAGNOSIS — E041 Nontoxic single thyroid nodule: Secondary | ICD-10-CM | POA: Diagnosis not present

## 2020-12-31 NOTE — Addendum Note (Signed)
Addended by: Orland Mustard on: 12/31/2020 11:55 AM   Modules accepted: Orders

## 2021-01-01 DIAGNOSIS — M25562 Pain in left knee: Secondary | ICD-10-CM | POA: Diagnosis not present

## 2021-01-01 DIAGNOSIS — G8929 Other chronic pain: Secondary | ICD-10-CM | POA: Diagnosis not present

## 2021-01-01 DIAGNOSIS — M25662 Stiffness of left knee, not elsewhere classified: Secondary | ICD-10-CM | POA: Diagnosis not present

## 2021-01-01 DIAGNOSIS — M6281 Muscle weakness (generalized): Secondary | ICD-10-CM | POA: Diagnosis not present

## 2021-01-01 DIAGNOSIS — Z96652 Presence of left artificial knee joint: Secondary | ICD-10-CM | POA: Diagnosis not present

## 2021-01-04 DIAGNOSIS — M25662 Stiffness of left knee, not elsewhere classified: Secondary | ICD-10-CM | POA: Diagnosis not present

## 2021-01-04 DIAGNOSIS — Z96652 Presence of left artificial knee joint: Secondary | ICD-10-CM | POA: Diagnosis not present

## 2021-01-04 DIAGNOSIS — M6281 Muscle weakness (generalized): Secondary | ICD-10-CM | POA: Diagnosis not present

## 2021-01-04 DIAGNOSIS — G8929 Other chronic pain: Secondary | ICD-10-CM | POA: Diagnosis not present

## 2021-01-04 DIAGNOSIS — M25562 Pain in left knee: Secondary | ICD-10-CM | POA: Diagnosis not present

## 2021-01-08 DIAGNOSIS — M6281 Muscle weakness (generalized): Secondary | ICD-10-CM | POA: Diagnosis not present

## 2021-01-08 DIAGNOSIS — M25562 Pain in left knee: Secondary | ICD-10-CM | POA: Diagnosis not present

## 2021-01-08 DIAGNOSIS — G8929 Other chronic pain: Secondary | ICD-10-CM | POA: Diagnosis not present

## 2021-01-08 DIAGNOSIS — M25662 Stiffness of left knee, not elsewhere classified: Secondary | ICD-10-CM | POA: Diagnosis not present

## 2021-01-08 DIAGNOSIS — Z96652 Presence of left artificial knee joint: Secondary | ICD-10-CM | POA: Diagnosis not present

## 2021-01-10 ENCOUNTER — Ambulatory Visit: Payer: Medicare Other | Admitting: Podiatry

## 2021-01-11 ENCOUNTER — Encounter: Payer: Self-pay | Admitting: Internal Medicine

## 2021-01-11 DIAGNOSIS — M6281 Muscle weakness (generalized): Secondary | ICD-10-CM | POA: Diagnosis not present

## 2021-01-11 DIAGNOSIS — M25562 Pain in left knee: Secondary | ICD-10-CM | POA: Diagnosis not present

## 2021-01-11 DIAGNOSIS — G8929 Other chronic pain: Secondary | ICD-10-CM | POA: Diagnosis not present

## 2021-01-11 DIAGNOSIS — M25662 Stiffness of left knee, not elsewhere classified: Secondary | ICD-10-CM | POA: Diagnosis not present

## 2021-01-11 DIAGNOSIS — Z96652 Presence of left artificial knee joint: Secondary | ICD-10-CM | POA: Diagnosis not present

## 2021-01-14 DIAGNOSIS — M25562 Pain in left knee: Secondary | ICD-10-CM | POA: Diagnosis not present

## 2021-01-14 DIAGNOSIS — Z96652 Presence of left artificial knee joint: Secondary | ICD-10-CM | POA: Diagnosis not present

## 2021-01-14 DIAGNOSIS — M25662 Stiffness of left knee, not elsewhere classified: Secondary | ICD-10-CM | POA: Diagnosis not present

## 2021-01-14 DIAGNOSIS — M6281 Muscle weakness (generalized): Secondary | ICD-10-CM | POA: Diagnosis not present

## 2021-01-14 DIAGNOSIS — G8929 Other chronic pain: Secondary | ICD-10-CM | POA: Diagnosis not present

## 2021-01-16 DIAGNOSIS — G8929 Other chronic pain: Secondary | ICD-10-CM | POA: Diagnosis not present

## 2021-01-16 DIAGNOSIS — Z96652 Presence of left artificial knee joint: Secondary | ICD-10-CM | POA: Diagnosis not present

## 2021-01-16 DIAGNOSIS — M25562 Pain in left knee: Secondary | ICD-10-CM | POA: Diagnosis not present

## 2021-01-16 DIAGNOSIS — M25662 Stiffness of left knee, not elsewhere classified: Secondary | ICD-10-CM | POA: Diagnosis not present

## 2021-01-16 DIAGNOSIS — M6281 Muscle weakness (generalized): Secondary | ICD-10-CM | POA: Diagnosis not present

## 2021-01-17 ENCOUNTER — Encounter: Payer: Self-pay | Admitting: Podiatry

## 2021-01-17 ENCOUNTER — Other Ambulatory Visit: Payer: Self-pay

## 2021-01-17 ENCOUNTER — Ambulatory Visit (INDEPENDENT_AMBULATORY_CARE_PROVIDER_SITE_OTHER): Payer: Medicare Other | Admitting: Podiatry

## 2021-01-17 DIAGNOSIS — B351 Tinea unguium: Secondary | ICD-10-CM

## 2021-01-17 DIAGNOSIS — M79674 Pain in right toe(s): Secondary | ICD-10-CM

## 2021-01-17 DIAGNOSIS — M79675 Pain in left toe(s): Secondary | ICD-10-CM | POA: Diagnosis not present

## 2021-01-17 NOTE — Progress Notes (Signed)
This patient returns to the office for evaluation and treatment of long thick painful nails .  This patient is unable to trim her own nails since the patient cannot reach her feet.  Patient says the nails are painful walking and wearing her shoes.  She returns for preventive foot care services.  General Appearance  Alert, conversant and in no acute stress.  Vascular  Dorsalis pedis and posterior tibial  pulses are palpable  bilaterally.  Capillary return is within normal limits  bilaterally. Temperature is within normal limits  bilaterally.  Neurologic  Senn-Weinstein monofilament wire test within normal limits  bilaterally. Muscle power within normal limits bilaterally.  Nails Thick disfigured discolored nails with subungual debris  from hallux to fifth toes bilaterally. No evidence of bacterial infection or drainage bilaterally.  Pincer nail left hallux.  Orthopedic  No limitations of motion  feet .  No crepitus or effusions noted.  No bony pathology or digital deformities noted.  Skin  normotropic skin with no porokeratosis noted bilaterally.  No signs of infections or ulcers noted.   Asymptomatic clavi 3rd left  and callus left  hallux   Onychomycosis  Pain in toes right foot  Pain in toes left foot  Debridement  of nails  1-5  B/L with a nail nipper.  Nails were then filed using a dremel tool with no incidents.    RTC  12 weeks    Gardiner Barefoot DPM

## 2021-01-22 ENCOUNTER — Other Ambulatory Visit: Payer: Self-pay

## 2021-01-22 DIAGNOSIS — Z96652 Presence of left artificial knee joint: Secondary | ICD-10-CM | POA: Diagnosis not present

## 2021-01-24 ENCOUNTER — Other Ambulatory Visit: Payer: Self-pay

## 2021-01-24 DIAGNOSIS — M25562 Pain in left knee: Secondary | ICD-10-CM | POA: Diagnosis not present

## 2021-01-24 DIAGNOSIS — M6281 Muscle weakness (generalized): Secondary | ICD-10-CM | POA: Diagnosis not present

## 2021-01-24 DIAGNOSIS — M25662 Stiffness of left knee, not elsewhere classified: Secondary | ICD-10-CM | POA: Diagnosis not present

## 2021-01-24 DIAGNOSIS — Z96652 Presence of left artificial knee joint: Secondary | ICD-10-CM | POA: Diagnosis not present

## 2021-01-24 DIAGNOSIS — G8929 Other chronic pain: Secondary | ICD-10-CM | POA: Diagnosis not present

## 2021-01-24 MED ORDER — AMIODARONE HCL 200 MG PO TABS: 200.0000 mg | ORAL_TABLET | ORAL | 0 refills | Status: DC

## 2021-01-29 DIAGNOSIS — Z96652 Presence of left artificial knee joint: Secondary | ICD-10-CM | POA: Diagnosis not present

## 2021-02-04 ENCOUNTER — Other Ambulatory Visit: Payer: Medicare Other

## 2021-02-06 ENCOUNTER — Ambulatory Visit (INDEPENDENT_AMBULATORY_CARE_PROVIDER_SITE_OTHER): Payer: Medicare Other | Admitting: Internal Medicine

## 2021-02-06 ENCOUNTER — Other Ambulatory Visit: Payer: Self-pay

## 2021-02-06 ENCOUNTER — Encounter: Payer: Self-pay | Admitting: Internal Medicine

## 2021-02-06 VITALS — BP 146/68 | HR 83 | Temp 98.1°F | Ht 63.0 in | Wt 141.4 lb

## 2021-02-06 DIAGNOSIS — Z13220 Encounter for screening for lipoid disorders: Secondary | ICD-10-CM

## 2021-02-06 DIAGNOSIS — N3000 Acute cystitis without hematuria: Secondary | ICD-10-CM

## 2021-02-06 DIAGNOSIS — E042 Nontoxic multinodular goiter: Secondary | ICD-10-CM | POA: Diagnosis not present

## 2021-02-06 DIAGNOSIS — R739 Hyperglycemia, unspecified: Secondary | ICD-10-CM | POA: Diagnosis not present

## 2021-02-06 DIAGNOSIS — E785 Hyperlipidemia, unspecified: Secondary | ICD-10-CM | POA: Diagnosis not present

## 2021-02-06 DIAGNOSIS — R030 Elevated blood-pressure reading, without diagnosis of hypertension: Secondary | ICD-10-CM

## 2021-02-06 DIAGNOSIS — E041 Nontoxic single thyroid nodule: Secondary | ICD-10-CM | POA: Diagnosis not present

## 2021-02-06 LAB — CBC WITH DIFFERENTIAL/PLATELET
Basophils Absolute: 0 10*3/uL (ref 0.0–0.1)
Basophils Relative: 0.5 % (ref 0.0–3.0)
Eosinophils Absolute: 0.1 10*3/uL (ref 0.0–0.7)
Eosinophils Relative: 1.1 % (ref 0.0–5.0)
HCT: 37.9 % (ref 36.0–46.0)
Hemoglobin: 11.9 g/dL — ABNORMAL LOW (ref 12.0–15.0)
Lymphocytes Relative: 16.1 % (ref 12.0–46.0)
Lymphs Abs: 1.4 10*3/uL (ref 0.7–4.0)
MCHC: 31.4 g/dL (ref 30.0–36.0)
MCV: 75.4 fl — ABNORMAL LOW (ref 78.0–100.0)
Monocytes Absolute: 0.6 10*3/uL (ref 0.1–1.0)
Monocytes Relative: 6.8 % (ref 3.0–12.0)
Neutro Abs: 6.7 10*3/uL (ref 1.4–7.7)
Neutrophils Relative %: 75.5 % (ref 43.0–77.0)
Platelets: 209 10*3/uL (ref 150.0–400.0)
RBC: 5.03 Mil/uL (ref 3.87–5.11)
RDW: 14.9 % (ref 11.5–15.5)
WBC: 8.8 10*3/uL (ref 4.0–10.5)

## 2021-02-06 LAB — COMPREHENSIVE METABOLIC PANEL
ALT: 14 U/L (ref 0–35)
AST: 15 U/L (ref 0–37)
Albumin: 4 g/dL (ref 3.5–5.2)
Alkaline Phosphatase: 81 U/L (ref 39–117)
BUN: 20 mg/dL (ref 6–23)
CO2: 30 mEq/L (ref 19–32)
Calcium: 9.6 mg/dL (ref 8.4–10.5)
Chloride: 104 mEq/L (ref 96–112)
Creatinine, Ser: 0.71 mg/dL (ref 0.40–1.20)
GFR: 80.18 mL/min (ref >60.00)
Glucose, Bld: 88 mg/dL (ref 70–99)
Potassium: 4.6 mEq/L (ref 3.5–5.1)
Sodium: 142 mEq/L (ref 135–145)
Total Bilirubin: 0.5 mg/dL (ref 0.2–1.2)
Total Protein: 6.5 g/dL (ref 6.0–8.3)

## 2021-02-06 LAB — LIPID PANEL
Cholesterol: 165 mg/dL (ref 0–200)
HDL: 71.9 mg/dL (ref >39.00)
LDL Cholesterol: 82 mg/dL (ref 0–99)
NonHDL: 93.18
Total CHOL/HDL Ratio: 2
Triglycerides: 57 mg/dL (ref 0.0–149.0)
VLDL: 11.4 mg/dL (ref 0.0–40.0)

## 2021-02-06 LAB — HEMOGLOBIN A1C: Hgb A1c MFr Bld: 5.1 % (ref 4.6–6.5)

## 2021-02-06 MED ORDER — AMLODIPINE BESYLATE 2.5 MG PO TABS
2.5000 mg | ORAL_TABLET | Freq: Every day | ORAL | 3 refills | Status: DC
Start: 2021-02-06 — End: 2021-10-15

## 2021-02-06 NOTE — Progress Notes (Signed)
Chief Complaint  Patient presents with   Follow-up   F/u 1. S/p left knee surgery doing well d/c PT  2. Elevated BP on lasix 20 mg qd at home lower and will monitor  3. Thyroid bx today at 3 pm for thyroid enlargement nodule    Review of Systems  Constitutional:  Negative for weight loss.  HENT:  Negative for hearing loss.   Eyes:  Negative for blurred vision.  Respiratory:  Negative for shortness of breath.   Cardiovascular:  Negative for chest pain.  Gastrointestinal:  Negative for abdominal pain.  Musculoskeletal:  Negative for falls and joint pain.  Skin:  Negative for rash.  Neurological:  Negative for headaches.  Psychiatric/Behavioral:  Negative for depression.   Past Medical History:  Diagnosis Date   A-fib ([place])    Anemia    Aortic atherosclerosis ([facility])    Arthritis    knees, right shoulder    CAD (coronary artery disease)    DDD (degenerative disc disease), lumbar    History of chicken pox    History of kidney stones    H/O   PAC (premature atrial contraction)    UTI (urinary tract infection)    Past Surgical History:  Procedure Laterality Date   CESAREAN SECTION     1978   EYE SURGERY     cataract 2013/2014 b/l    FOOT SURGERY     bunion an dhammer toe in 2009   KNEE ARTHROPLASTY Left 12/12/2020   Procedure: COMPUTER ASSISTED TOTAL KNEE ARTHROPLASTY;  Surgeon: Dereck Leep, MD;  Location: [facility] ORS;  Service: Orthopedics;  Laterality: Left;   TEE WITHOUT CARDIOVERSION N/A 11/07/2020   Procedure: TRANSESOPHAGEAL ECHOCARDIOGRAM (TEE);  Surgeon: Kate Sable, MD;  Location: [facility] ORS;  Service: Cardiovascular;  Laterality: N/A;   Family History  Problem Relation Age of Onset   Heart disease Mother        died when pt was 26 y.o    Heart Problems Mother    Heart disease Father    Social History   Socioeconomic History   Marital status: Widowed    Spouse name: Not on file   Number of children: Not on file   Years of education: Not on file    Highest education level: Not on file  Occupational History   Not on file  Tobacco Use   Smoking status: Never   Smokeless tobacco: Never  Vaping Use   Vaping Use: Never used  Substance and Sexual Activity   Alcohol use: Not Currently   Drug use: Never   Sexual activity: Not on file  Other Topics Concern   Not on file  Social History Narrative   From [place] lived in [place] since late 1990s early 2000    Lives with daughter    Secretary/administrator ed    Former Pharmacist, hospital    No guns, wears seat belt, safe in relationship    Widowed       2 daughters 1/2 in [place] Determinants of Health   Financial Resource Strain: Low Risk    Difficulty of Paying Living Expenses: Not hard at all  Food Insecurity: No Food Insecurity   Worried About Charity fundraiser in the Last Year: Never true   Arboriculturist in the Last Year: Never true  Transportation Needs: No Transportation Needs   Lack of Transportation (Medical): No   Lack of Transportation (Non-Medical): No  Physical Activity: Not on file  Stress:  No Stress Concern Present   Feeling of Stress : Not at all  Social Connections: Unknown   Frequency of Communication with Friends and Family: More than three times a week   Frequency of Social Gatherings with Friends and Family: Not on file   Attends Religious Services: Not on Electrical engineer or Organizations: Not on file   Attends Archivist Meetings: Not on file   Marital Status: Not on file  Intimate Partner Violence: Not At Risk   Fear of Current or Ex-Partner: No   Emotionally Abused: No   Physically Abused: No   Sexually Abused: No   Current Meds  Medication Sig   amiodarone (PACERONE) 200 MG tablet Take 1 tablet (200 mg total) by mouth every morning.   amLODipine (NORVASC) 2.5 MG tablet Take 1 tablet (2.5 mg total) by mouth daily. If BP >140/>80 fill this   B Complex-C-Folic Acid TABS Take 1 tablet by mouth daily.   Calcium-Magnesium-Vitamin D (CALCIUM  1200+D3 PO) Take 1 tablet by mouth daily.   cholecalciferol (VITAMIN D3) 25 MCG (1000 UNIT) tablet Take 1,000 Units by mouth daily.   Ferrous Sulfate (IRON) 325 (65 Fe) MG TABS Take 325 mg by mouth daily.   furosemide (LASIX) 20 MG tablet Take 1 tablet (20 mg total) by mouth daily. In am   Multiple Vitamins-Minerals (MULTIVITAMIN WITH MINERALS) tablet Take 1 tablet by mouth daily.   Omega-3 Fatty Acids (FISH OIL) 1200 MG CPDR Take 1 capsule by mouth daily.   OVER THE COUNTER MEDICATION Take 1 tablet by mouth daily. COGNIUM MEMORY   rivaroxaban (XARELTO) 20 MG TABS tablet Take 1 tablet (20 mg total) by mouth daily with supper. (Patient taking differently: Take 20 mg by mouth every morning.)   traMADol (ULTRAM) 50 MG tablet Take 1 tablet (50 mg total) by mouth every 4 (four) hours as needed for moderate pain.   vitamin B-12 (CYANOCOBALAMIN) 100 MCG tablet Take 100 mcg by mouth daily.   No Known Allergies Recent Results (from the past 2160 hour(s))  CBC     Status: Abnormal   Collection Time: 11/30/20  2:09 PM  Result Value Ref Range   WBC 8.5 4.0 - 10.5 K/uL   RBC 4.84 3.87 - 5.11 MIL/uL   Hemoglobin 11.4 (L) 12.0 - 15.0 g/dL   HCT 36.1 36.0 - 46.0 %   MCV 74.6 (L) 80.0 - 100.0 fL   MCH 23.6 (L) 26.0 - 34.0 pg   MCHC 31.6 30.0 - 36.0 g/dL   RDW 16.0 (H) 11.5 - 15.5 %   Platelets 222 150 - 400 K/uL   nRBC 0.0 0.0 - 0.2 %    Comment: Performed at [facility], [address] [place], [place] [address]  Comprehensive metabolic panel     Status: Abnormal   Collection Time: 11/30/20  2:09 PM  Result Value Ref Range   Sodium 137 135 - 145 mmol/L   Potassium 3.3 (L) 3.5 - 5.1 mmol/L   Chloride 100 98 - 111 mmol/L   CO2 29 22 - 32 mmol/L   Glucose, Bld 102 (H) 70 - 99 mg/dL    Comment: Glucose reference range applies only to samples taken after fasting for at least 8 hours.   BUN 17 8 - 23 mg/dL   Creatinine, Ser 0.70 0.44 - 1.00 mg/dL   Calcium 9.4 8.9 - 10.3 mg/dL   Total  Protein 6.7 6.5 - 8.1 g/dL   Albumin 3.7  3.5 - 5.0 g/dL   AST 24 15 - 41 U/L   ALT 22 0 - 44 U/L   Alkaline Phosphatase 58 38 - 126 U/L   Total Bilirubin 0.8 0.3 - 1.2 mg/dL   GFR, Estimated >60 >60 mL/min    Comment: (NOTE) Calculated using the CKD-EPI Creatinine Equation (2021)    Anion gap 8 5 - 15    Comment: Performed at [facility], [address] [place], [place] [address]  Protime-INR     Status: Abnormal   Collection Time: 11/30/20  2:09 PM  Result Value Ref Range   Prothrombin Time 16.9 (H) 11.4 - 15.2 seconds   INR 1.4 (H) 0.8 - 1.2    Comment: (NOTE) INR goal varies based on device and disease states. Performed at [facility] [address] [place], [place] [address]   APTT     Status: None   Collection Time: 11/30/20  2:09 PM  Result Value Ref Range   aPTT 29 24 - 36 seconds    Comment: Performed at [facility], [address] [place], [place] [address]  Urinalysis, Routine w reflex microscopic     Status: Abnormal   Collection Time: 11/30/20  2:09 PM  Result Value Ref Range   Color, Urine YELLOW (A) YELLOW   APPearance HAZY (A) CLEAR   Specific Gravity, Urine 1.013 1.005 - 1.030   pH 7.0 5.0 - 8.0   Glucose, UA NEGATIVE NEGATIVE mg/dL   Hgb urine dipstick SMALL (A) NEGATIVE   Bilirubin Urine NEGATIVE NEGATIVE   Ketones, ur NEGATIVE NEGATIVE mg/dL   Protein, ur NEGATIVE NEGATIVE mg/dL   Nitrite NEGATIVE NEGATIVE   Leukocytes,Ua NEGATIVE NEGATIVE   RBC / HPF 0-5 0 - 5 RBC/hpf   WBC, UA 0-5 0 - 5 WBC/hpf   Bacteria, UA RARE (A) NONE SEEN   Squamous Epithelial / LPF 0-5 0 - 5   Mucus PRESENT     Comment: Performed at [facility], [address] [place], [place] [address]  Urine culture     Status: Abnormal   Collection Time: 11/30/20  2:09 PM   Specimen: Urine, Clean Catch  Result Value Ref Range   Specimen Description      URINE, CLEAN CATCH Performed at [facility], [address],  [place], [place] [address]    Special Requests      Normal Performed at [facility], [address], [place], [place] [address]    Culture MULTIPLE SPECIES PRESENT, SUGGEST RECOLLECTION (A)    Report Status 12/02/2020 FINAL   Type and screen Order type and screen if day of surgery is less than 15 days from draw of preadmission visit or order morning of surgery if day of surgery is greater than 6 days from preadmission visit.     Status: None   Collection Time: 11/30/20  2:09 PM  Result Value Ref Range   ABO/RH(D) A POS    Antibody Screen NEG    Sample Expiration 12/14/2020,2359    Extend sample reason      NO TRANSFUSIONS OR PREGNANCY IN THE PAST 3 MONTHS Performed at [facility], [address] [place], [place] [address]   C-reactive protein     Status: None   Collection Time: 11/30/20  2:09 PM  Result Value Ref Range   CRP 0.6 <1.0 mg/dL    Comment: Performed at [facility], [place] [address] [place], [place] [address]  Surgical pcr  screen     Status: None   Collection Time: 11/30/20  2:09 PM   Specimen: Nasal Mucosa; Nasal Swab  Result Value Ref Range   MRSA, PCR NEGATIVE NEGATIVE   Staphylococcus aureus NEGATIVE NEGATIVE    Comment: (NOTE) The Xpert SA Assay (FDA approved for NASAL specimens in patients 57 years of age and older), is one component of a comprehensive surveillance program. It is not intended to diagnose infection nor to guide or monitor treatment. Performed at [facility], [place], [place] [address]   Sedimentation rate     Status: None   Collection Time: 11/30/20  2:09 PM  Result Value Ref Range   Sed Rate 14 0 - 30 mm/hr    Comment: Performed at [facility], [place], [place] [address]  SARS CORONAVIRUS 2 (TAT 6-24 HRS) Nasopharyngeal Nasopharyngeal Swab     Status: None   Collection Time: 12/10/20  9:03 AM   Specimen: Nasopharyngeal Swab  Result Value Ref Range   SARS  Coronavirus 2 NEGATIVE NEGATIVE    Comment: (NOTE) SARS-CoV-2 target nucleic acids are NOT DETECTED.  The SARS-CoV-2 RNA is generally detectable in upper and lower respiratory specimens during the acute phase of infection. Negative results do not preclude SARS-CoV-2 infection, do not rule out co-infections with other pathogens, and should not be used as the sole basis for treatment or other patient management decisions. Negative results must be combined with clinical observations, patient history, and epidemiological information. The expected result is Negative.  Fact Sheet for Patients: SugarRoll.be  Fact Sheet for Healthcare Providers: https://www.woods-mathews.com/  This test is not yet approved or cleared by the [place] FDA and  has been authorized for detection and/or diagnosis of SARS-CoV-2 by FDA under an Emergency Use Authorization (EUA). This EUA will remain  in effect (meaning this test can be used) for the duration of the COVID-19 declaration under Se ction 564(b)(1) of the Act, 21 U.S.C. section 360bbb-3(b)(1), unless the authorization is terminated or revoked sooner.  Performed at [facility], [place] [address], [place], [place] [address]   ABO/Rh     Status: None   Collection Time: 12/12/20 11:27 AM  Result Value Ref Range   ABO/RH(D)      A POS Performed at [facility], [address] [place], [place] [address]   CBC     Status: Abnormal   Collection Time: 12/14/20  5:11 AM  Result Value Ref Range   WBC 16.2 (H) 4.0 - 10.5 K/uL   RBC 4.54 3.87 - 5.11 MIL/uL   Hemoglobin 10.8 (L) 12.0 - 15.0 g/dL   HCT 33.7 (L) 36.0 - 46.0 %   MCV 74.2 (L) 80.0 - 100.0 fL   MCH 23.8 (L) 26.0 - 34.0 pg   MCHC 32.0 30.0 - 36.0 g/dL   RDW 15.5 11.5 - 15.5 %   Platelets 295 150 - 400 K/uL   nRBC 0.0 0.0 - 0.2 %    Comment: Performed at [facility], [address] [place], [place] [address]    Objective  Body mass index is 25.05 kg/m. Wt Readings from Last 3 Encounters:  02/06/21 141 lb 6.4 oz (64.1 kg)  12/13/20 141 lb 1.5 oz (64 kg)  11/30/20 132 lb 15 oz (60.3 kg)   Temp Readings from Last 3 Encounters:  02/06/21 98.1 F (36.7 C) (Oral)  12/14/20 98.3 F (36.8 C) (Oral)  11/16/20 98.3 F (36.8 C) (Oral)  BP Readings from Last 3 Encounters:  02/06/21 (!) 146/68  12/14/20 (!) 155/98  11/30/20 129/66   Pulse Readings from Last 3 Encounters:  02/06/21 83  12/14/20 85  11/30/20 80    Physical Exam Vitals and nursing note reviewed.  Constitutional:      Appearance: Normal appearance. She is well-developed and well-groomed.  HENT:     Head: Normocephalic and atraumatic.  Eyes:     Conjunctiva/sclera: Conjunctivae normal.     Pupils: Pupils are equal, round, and reactive to light.  Cardiovascular:     Rate and Rhythm: Normal rate and regular rhythm.     Heart sounds: Normal heart sounds. No murmur heard. Pulmonary:     Effort: Pulmonary effort is normal.     Breath sounds: Normal breath sounds.  Skin:    General: Skin is warm and dry.  Neurological:     General: No focal deficit present.     Mental Status: She is alert and oriented to person, place, and time. Mental status is at baseline.     Gait: Gait normal.  Psychiatric:        Attention and Perception: Attention and perception normal.        Mood and Affect: Mood and affect normal.        Speech: Speech normal.        Behavior: Behavior normal. Behavior is cooperative.        Thought Content: Thought content normal.        Cognition and Memory: Cognition and memory normal.        Judgment: Judgment normal.    Assessment  Plan  Elevated blood pressure reading - Plan: Comprehensive metabolic panel, Lipid panel, CBC with Differential/Platelet, amLODipine (NORVASC) 2.5 MG tablet qd fill if BP >140/>80  Monitor BP   Hyperglycemia - Plan: Hemoglobin A1c  Acute cystitis without hematuria -  Plan: Urine Culture  Lipid screening - Plan: Lipid panel Hyperlipidemia, unspecified hyperlipidemia type - Plan: Lipid panel  Thyroid nodule  Thyroid bx today 3 pm   HM Flu shot  and prevnar utd and pna 23 Consider shingrix vaccine and Tdap given Rx 08/08/20 covid 3/3 consider booster 4th dose    Never smoker    Pap out of age window cologuard neg 09/2018 Mammogram negative 09/24/20  dexa 12/2018 osteopenia on calcium 1200 mg qd and vitamin D3 1000 to 2000 iu daily  Never had colonoscopy    Skin Dr. Kellie Moor h/o [facility] nose appt sch 12/05/20  Surgery left knee 12/12/20 Dr. Marry Guan arthritis   Provider: Dr. Olivia Mackie McLean-Scocuzza-Internal Medicine

## 2021-02-07 LAB — URINE CULTURE
MICRO NUMBER:: 12114434
Result:: NO GROWTH
SPECIMEN QUALITY:: ADEQUATE

## 2021-02-21 ENCOUNTER — Other Ambulatory Visit: Payer: Self-pay

## 2021-02-21 MED ORDER — AMIODARONE HCL 200 MG PO TABS: 200.0000 mg | ORAL_TABLET | ORAL | 0 refills | Status: DC

## 2021-02-25 ENCOUNTER — Ambulatory Visit: Payer: Medicare Other

## 2021-03-07 ENCOUNTER — Other Ambulatory Visit: Payer: Self-pay

## 2021-03-07 MED ORDER — AMIODARONE HCL 200 MG PO TABS: 200.0000 mg | ORAL_TABLET | ORAL | 0 refills | Status: DC

## 2021-04-28 ENCOUNTER — Encounter: Payer: Self-pay | Admitting: Internal Medicine

## 2021-04-28 DIAGNOSIS — R600 Localized edema: Secondary | ICD-10-CM

## 2021-04-29 MED ORDER — FUROSEMIDE 20 MG PO TABS: 20.0000 mg | ORAL_TABLET | Freq: Every day | ORAL | 5 refills | Status: DC

## 2021-04-29 NOTE — Telephone Encounter (Signed)
Medication has been refilled.

## 2021-05-20 ENCOUNTER — Ambulatory Visit (INDEPENDENT_AMBULATORY_CARE_PROVIDER_SITE_OTHER): Payer: Medicare Other | Admitting: Cardiology

## 2021-05-20 ENCOUNTER — Encounter: Payer: Self-pay | Admitting: Cardiology

## 2021-05-20 ENCOUNTER — Other Ambulatory Visit: Payer: Self-pay

## 2021-05-20 VITALS — BP 144/66 | HR 75 | Ht 63.0 in | Wt 131.0 lb

## 2021-05-20 DIAGNOSIS — R600 Localized edema: Secondary | ICD-10-CM

## 2021-05-20 DIAGNOSIS — Z23 Encounter for immunization: Secondary | ICD-10-CM | POA: Diagnosis not present

## 2021-05-20 DIAGNOSIS — R030 Elevated blood-pressure reading, without diagnosis of hypertension: Secondary | ICD-10-CM | POA: Diagnosis not present

## 2021-05-20 DIAGNOSIS — I4892 Unspecified atrial flutter: Secondary | ICD-10-CM | POA: Diagnosis not present

## 2021-05-20 DIAGNOSIS — I10 Essential (primary) hypertension: Secondary | ICD-10-CM | POA: Diagnosis not present

## 2021-05-20 MED ORDER — FUROSEMIDE 20 MG PO TABS: 20.0000 mg | ORAL_TABLET | Freq: Every day | ORAL | 5 refills | Status: DC | PRN

## 2021-05-20 NOTE — Patient Instructions (Signed)
Medication Instructions:    Your physician has recommended you make the following change in your medication:   Take your Lasix only as needed for swelling or shortness of breath.   *If you need a refill on your cardiac medications before your next appointment, please call your pharmacy*   Lab Work:  Your physician recommends that you return for lab work (TSH, Free T4) in: Prior to your follow up appointment   Please return to our office on_____________________at______________am/pm  Our office will call you to schedule this appointment   Testing/Procedures: None ordered   Follow-Up: At [facility], you and your health needs are our priority.  As part of our continuing mission to provide you with exceptional heart care, we have created designated Provider Care Teams.  These Care Teams include your primary Cardiologist (physician) and Advanced Practice Providers (APPs -  Physician Assistants and Nurse Practitioners) who all work together to provide you with the care you need, when you need it.  We recommend signing up for the patient portal called "MyChart".  Sign up information is provided on this After Visit Summary.  MyChart is used to connect with patients for Virtual Visits (Telemedicine).  Patients are able to view lab/test results, encounter notes, upcoming appointments, etc.  Non-urgent messages can be sent to your provider as well.   To learn more about what you can do with MyChart, go to [email or web address].    Your next appointment:   6 month(s)  The format for your next appointment:   In Person  Provider:   You may see Dr. Garen Lah or one of the following Advanced Practice Providers on your designated Care Team:   Murray Hodgkins, NP Christell Faith, PA-C Marrianne Mood, PA-C Cadence Kathlen Mody, [place]   Other Instructions

## 2021-05-20 NOTE — Progress Notes (Signed)
Cardiology Office Note:    Date:  05/20/2021   ID:  Kelli Smith, DOB [birth year], MRN [id number]  PCP:  McLean-Scocuzza, Nino Glow, MD  Cardiologist:  None  Electrophysiologist:  None   Referring MD: McLean-Scocuzza, Olivia Mackie *   Chief Complaint  Patient presents with   Other    6 month follow up -- Meds reviewed verbally with patient.     History of Present Illness:    Kelli Smith is a 80 y.o. female with a hx of arthritis, paroxysmal A. fib/flutter s/p DCCV 11/07/2020 who presents for follow-up.    Being seen for atrial flutter.  Maintaining sinus rhythm on amiodarone.  Takes Xarelto for stroke prophylaxis.  No bleeding issues noted.  Successfully underwent left knee surgery.  States walking better.  Denies any chest pain, shortness of breath, palpitations.  Feels well, has no concerns at this time.  Checks blood pressure frequently at home, systolic blood pressure of 120s to 130s.  States blood pressure elevates when she comes to the office.  Prior notes Echocardiogram 01/16 normal systolic function, mild LVH, EF 60 to 65%. Cardiac monitor on 09/2019 reviewed again showing paroxysmal atrial fibrillation.   Past Medical History:  Diagnosis Date   A-fib ([place])    Anemia    Aortic atherosclerosis (HCC)    Arthritis    knees, right shoulder    CAD (coronary artery disease)    DDD (degenerative disc disease), lumbar    History of chicken pox    History of kidney stones    H/O   PAC (premature atrial contraction)    UTI (urinary tract infection)     Past Surgical History:  Procedure Laterality Date   CESAREAN SECTION     1978   EYE SURGERY     cataract 2013/2014 b/l    FOOT SURGERY     bunion an dhammer toe in 2009   KNEE ARTHROPLASTY Left 12/12/2020   Procedure: COMPUTER ASSISTED TOTAL KNEE ARTHROPLASTY;  Surgeon: Dereck Leep, MD;  Location: [facility] ORS;  Service: Orthopedics;  Laterality: Left;   TEE WITHOUT CARDIOVERSION N/A 11/07/2020   Procedure: TRANSESOPHAGEAL  ECHOCARDIOGRAM (TEE);  Surgeon: Kate Sable, MD;  Location: [facility] ORS;  Service: Cardiovascular;  Laterality: N/A;    Current Medications: Current Meds  Medication Sig   rivaroxaban (XARELTO) 20 MG TABS tablet Take 1 tablet (20 mg total) by mouth daily with supper.     Allergies:   Patient has no known allergies.   Social History   Socioeconomic History   Marital status: Widowed    Spouse name: Not on file   Number of children: Not on file   Years of education: Not on file   Highest education level: Not on file  Occupational History   Not on file  Tobacco Use   Smoking status: Never   Smokeless tobacco: Never  Vaping Use   Vaping Use: Never used  Substance and Sexual Activity   Alcohol use: Not Currently   Drug use: Never   Sexual activity: Not on file  Other Topics Concern   Not on file  Social History Narrative   From [place] lived in [place] since late 1990s early 2000    Lives with daughter    Secretary/administrator ed    Former Pharmacist, hospital    No guns, wears seat belt, safe in relationship    Widowed       2 daughters 1/2 in [place]  Strain: Low Risk    Difficulty of Paying Living Expenses: Not hard at all  Food Insecurity: No Food Insecurity   Worried About Charity fundraiser in the Last Year: Never true   Ran Out of Food in the Last Year: Never true  Transportation Needs: No Transportation Needs   Lack of Transportation (Medical): No   Lack of Transportation (Non-Medical): No  Physical Activity: Not on file  Stress: No Stress Concern Present   Feeling of Stress : Not at all  Social Connections: Unknown   Frequency of Communication with Friends and Family: More than three times a week   Frequency of Social Gatherings with Friends and Family: Not on file   Attends Religious Services: Not on file   Active Member of Clubs or Organizations: Not on file   Attends Archivist Meetings: Not on file   Marital  Status: Not on file     Family History: The patient's family history includes Heart Problems in her mother; Heart disease in her father and mother.  ROS:   Please see the history of present illness.     All other systems reviewed and are negative.  EKGs/Labs/Other Studies Reviewed:    The following studies were reviewed today:   EKG:  EKG is  ordered today.  The ekg ordered today demonstrates normal sinus rhythm, normal ECG.  Recent Labs: 11/02/2020: TSH 0.72 02/06/2021: ALT 14; BUN 20; Creatinine, Ser 0.71; Hemoglobin 11.9; Platelets 209.0; Potassium 4.6; Sodium 142  Recent Lipid Panel    Component Value Date/Time   CHOL 165 02/06/2021 0909   TRIG 57.0 02/06/2021 0909   HDL 71.90 02/06/2021 0909   CHOLHDL 2 02/06/2021 0909   VLDL 11.4 02/06/2021 0909   LDLCALC 82 02/06/2021 0909    Physical Exam:    VS:  BP (!) 144/66 (BP Location: Left Arm, Patient Position: Sitting, Cuff Size: Normal)   Pulse 75   Ht 5\' 3"  (1.6 m)   Wt 131 lb (59.4 kg)   SpO2 97%   BMI 23.21 kg/m     Wt Readings from Last 3 Encounters:  05/20/21 131 lb (59.4 kg)  02/06/21 141 lb 6.4 oz (64.1 kg)  12/13/20 141 lb 1.5 oz (64 kg)     GEN:  Well nourished, well developed in no acute distress HEENT: Normal NECK: No JVD; No carotid bruits LYMPHATICS: No lymphadenopathy CARDIAC: Irregular irregular, tachycardic, no murmurs RESPIRATORY:  Clear to auscultation without rales, wheezing or rhonchi  ABDOMEN: Soft, non-tender, non-distended MUSCULOSKELETAL:  No edema; No deformity  SKIN: Warm and dry NEUROLOGIC:  Alert and oriented x 3 PSYCHIATRIC:  Normal affect   ASSESSMENT:    1. Atrial flutter, unspecified type ([place])   2. Elevated BP without diagnosis of hypertension   3. Leg edema   4. Hypertension, unspecified type      PLAN:    In order of problems listed above:  Atrial flutter status post DC cardioversion 10/2020.  CHADS2 vascular score of 3.  Continue amiodarone, Xarelto. Elevated BP,  BP controlled at home.  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  Continue to monitor.  Okay to take Lasix as needed.  Follow-up in 6 months.  Total encounter time 30 minutes  Greater than 50% was spent in counseling and coordination of care with the patient   Medication Adjustments/Labs and Tests Ordered: Current medicines are reviewed at length with the patient today.  Concerns regarding medicines are outlined above.  Orders Placed This Encounter  Procedures   TSH   T4, free   EKG 12-Lead    Meds ordered this encounter  Medications   furosemide (LASIX) 20 MG tablet    Sig: Take 1 tablet (20 mg total) by mouth daily as needed. For swelling and shortness of breath.    Dispense:  30 tablet    Refill:  5     Patient Instructions  Medication Instructions:    Your physician has recommended you make the following change in your medication:   Take your Lasix only as needed for swelling or shortness of breath.   *If you need a refill on your cardiac medications before your next appointment, please call your pharmacy*   Lab Work:  Your physician recommends that you return for lab work (TSH, Free T4) in: Prior to your follow up appointment   Please return to our office on_____________________at______________am/pm  Our office will call you to schedule this appointment   Testing/Procedures: None ordered   Follow-Up: At [facility], you and your health needs are our priority.  As part of our continuing mission to provide you with exceptional heart care, we have created designated Provider Care Teams.  These Care Teams include your primary Cardiologist (physician) and Advanced Practice Providers (APPs -  Physician Assistants and Nurse Practitioners) who all work together to provide you with the care you need, when you need it.  We recommend signing up for the patient portal called "MyChart".  Sign up information is provided on this After Visit Summary.  MyChart  is used to connect with patients for Virtual Visits (Telemedicine).  Patients are able to view lab/test results, encounter notes, upcoming appointments, etc.  Non-urgent messages can be sent to your provider as well.   To learn more about what you can do with MyChart, go to [email or web address].    Your next appointment:   6 month(s)  The format for your next appointment:   In Person  Provider:   You may see Dr. Garen Lah or one of the following Advanced Practice Providers on your designated Care Team:   Murray Hodgkins, NP Christell Faith, PA-C Marrianne Mood, PA-C Cadence Roots, [place]   Other Instructions    Signed, Kate Sable, MD  05/20/2021 12:31 PM    [place]

## 2021-05-23 DIAGNOSIS — Z23 Encounter for immunization: Secondary | ICD-10-CM | POA: Diagnosis not present

## 2021-05-29 ENCOUNTER — Other Ambulatory Visit: Payer: Self-pay

## 2021-05-29 ENCOUNTER — Ambulatory Visit: Payer: Medicare Other

## 2021-05-29 ENCOUNTER — Ambulatory Visit (INDEPENDENT_AMBULATORY_CARE_PROVIDER_SITE_OTHER): Payer: Medicare Other | Admitting: Podiatry

## 2021-05-29 ENCOUNTER — Encounter: Payer: Self-pay | Admitting: Podiatry

## 2021-05-29 DIAGNOSIS — M79674 Pain in right toe(s): Secondary | ICD-10-CM

## 2021-05-29 DIAGNOSIS — B351 Tinea unguium: Secondary | ICD-10-CM | POA: Diagnosis not present

## 2021-05-29 DIAGNOSIS — M79675 Pain in left toe(s): Secondary | ICD-10-CM | POA: Diagnosis not present

## 2021-05-29 NOTE — Progress Notes (Signed)
This patient returns to the office for evaluation and treatment of long thick painful nails .  This patient is unable to trim her own nails since the patient cannot reach her feet.  Patient says the nails are painful walking and wearing her shoes.  She returns for preventive foot care services.  General Appearance  Alert, conversant and in no acute stress.  Vascular  Dorsalis pedis and posterior tibial  pulses are palpable  bilaterally.  Capillary return is within normal limits  bilaterally. Temperature is within normal limits  bilaterally.  Neurologic  Senn-Weinstein monofilament wire test within normal limits  bilaterally. Muscle power within normal limits bilaterally.  Nails Thick disfigured discolored nails with subungual debris  from hallux to fifth toes bilaterally. No evidence of bacterial infection or drainage bilaterally.  Pincer nail left hallux.  Orthopedic  No limitations of motion  feet .  No crepitus or effusions noted.  No bony pathology or digital deformities noted.  Skin  normotropic skin with no porokeratosis noted bilaterally.  No signs of infections or ulcers noted.  Onychomycosis  Pain in toes right foot  Pain in toes left foot  Debridement  of nails  1-5  B/L with a nail nipper.  Nails were then filed using a dremel tool with no incidents.    RTC  12 weeks    Gardiner Barefoot DPM

## 2021-06-04 ENCOUNTER — Ambulatory Visit (INDEPENDENT_AMBULATORY_CARE_PROVIDER_SITE_OTHER): Payer: Medicare Other

## 2021-06-04 VITALS — Ht 63.0 in | Wt 131.0 lb

## 2021-06-04 DIAGNOSIS — Z0000 Encounter for general adult medical examination without abnormal findings: Secondary | ICD-10-CM | POA: Diagnosis not present

## 2021-06-04 NOTE — Patient Instructions (Addendum)
  Ms. Dunigan , Thank you for taking time to come for your Medicare Wellness Visit. I appreciate your ongoing commitment to your health goals. Please review the following plan we discussed and let me know if I can assist you in the future.   These are the goals we discussed:  Goals      Follow up with Primary Care Provider     As needed Stay hydrated        This is a list of the screening recommended for you and due dates:  Health Maintenance  Topic Date Due   Zoster (Shingles) Vaccine (1 of 2) 09/04/2021*   COVID-19 Vaccine (4 - Booster for Pfizer series) 01/25/2022*   Tetanus Vaccine  05/21/2031   Pneumonia Vaccine  Completed   Flu Shot  Completed   DEXA scan (bone density measurement)  Completed   HPV Vaccine  Aged Out  *Topic was postponed. The date shown is not the original due date.

## 2021-06-04 NOTE — Progress Notes (Signed)
Subjective:   Kelli Smith is a 80 y.o. female who presents for Medicare Annual (Subsequent) preventive examination.  Review of Systems    No ROS.  Medicare Wellness Virtual Visit.  Visual/audio telehealth visit, UTA vital signs.   See social history for additional risk factors.   Cardiac Risk Factors include: advanced age (>10men, >42 women)     Objective:    Today's Vitals   06/04/21 1413  Weight: 131 lb (59.4 kg)  Height: 5\' 3"  (1.6 m)   Body mass index is 23.21 kg/m.  Advanced Directives 12/12/2020 11/30/2020 05/28/2020 05/26/2019  Does Patient Have a Medical Advance Directive? No No No No  Does patient want to make changes to medical advance directive? - - - No - Patient declined  Would patient like information on creating a medical advance directive? No - Patient declined - No - Patient declined -    Current Medications (verified) Outpatient Encounter Medications as of 06/04/2021  Medication Sig   amiodarone (PACERONE) 200 MG tablet Take 1 tablet (200 mg total) by mouth every morning.   amLODipine (NORVASC) 2.5 MG tablet Take 1 tablet (2.5 mg total) by mouth daily. If BP >140/>80 fill this (Patient not taking: Reported on 05/20/2021)   B Complex-C-Folic Acid TABS Take 1 tablet by mouth daily.   Calcium-Magnesium-Vitamin D (CALCIUM 1200+D3 PO) Take 1 tablet by mouth daily.   cholecalciferol (VITAMIN D3) 25 MCG (1000 UNIT) tablet Take 1,000 Units by mouth daily.   Ferrous Sulfate (IRON) 325 (65 Fe) MG TABS Take 325 mg by mouth daily.   furosemide (LASIX) 20 MG tablet Take 1 tablet (20 mg total) by mouth daily as needed. For swelling and shortness of breath.   Multiple Vitamins-Minerals (MULTIVITAMIN WITH MINERALS) tablet Take 1 tablet by mouth daily.   Omega-3 Fatty Acids (FISH OIL) 1200 MG CPDR Take 1 capsule by mouth daily.   OVER THE COUNTER MEDICATION Take 1 tablet by mouth daily. COGNIUM MEMORY   rivaroxaban (XARELTO) 20 MG TABS tablet Take 1 tablet (20 mg total) by  mouth daily with supper.   traMADol (ULTRAM) 50 MG tablet Take 1 tablet (50 mg total) by mouth every 4 (four) hours as needed for moderate pain. (Patient not taking: Reported on 05/20/2021)   vitamin B-12 (CYANOCOBALAMIN) 100 MCG tablet Take 100 mcg by mouth daily.   No facility-administered encounter medications on file as of 06/04/2021.    Allergies (verified) Patient has no known allergies.   History: Past Medical History:  Diagnosis Date   A-fib ([place])    Anemia    Aortic atherosclerosis (HCC)    Arthritis    knees, right shoulder    CAD (coronary artery disease)    DDD (degenerative disc disease), lumbar    History of chicken pox    History of kidney stones    H/O   PAC (premature atrial contraction)    UTI (urinary tract infection)    Past Surgical History:  Procedure Laterality Date   CESAREAN SECTION     1978   EYE SURGERY     cataract 2013/2014 b/l    FOOT SURGERY     bunion an dhammer toe in 2009   KNEE ARTHROPLASTY Left 12/12/2020   Procedure: COMPUTER ASSISTED TOTAL KNEE ARTHROPLASTY;  Surgeon: Dereck Leep, MD;  Location: [facility] ORS;  Service: Orthopedics;  Laterality: Left;   TEE WITHOUT CARDIOVERSION N/A 11/07/2020   Procedure: TRANSESOPHAGEAL ECHOCARDIOGRAM (TEE);  Surgeon: Kate Sable, MD;  Location: [facility] ORS;  Service: Cardiovascular;  Laterality: N/A;   Family History  Problem Relation Age of Onset   Heart disease Mother        died when pt was 80 y.o    Heart Problems Mother    Heart disease Father    Alzheimer's disease Sister    Social History   Socioeconomic History   Marital status: Widowed    Spouse name: Not on file   Number of children: Not on file   Years of education: Not on file   Highest education level: Not on file  Occupational History   Not on file  Tobacco Use   Smoking status: Never   Smokeless tobacco: Never  Vaping Use   Vaping Use: Never used  Substance and Sexual Activity   Alcohol use: Not Currently   Drug  use: Never   Sexual activity: Not on file  Other Topics Concern   Not on file  Social History Narrative   From [place] lived in [place] since late 1990s early 2000    Lives with daughter    Secretary/administrator ed    Former Pharmacist, hospital    No guns, wears seat belt, safe in relationship    Widowed       2 daughters 1/2 in [place] Determinants of Health   Financial Resource Strain: Low Risk    Difficulty of Paying Living Expenses: Not hard at all  Food Insecurity: No Food Insecurity   Worried About Charity fundraiser in the Last Year: Never true   Arboriculturist in the Last Year: Never true  Transportation Needs: No Transportation Needs   Lack of Transportation (Medical): No   Lack of Transportation (Non-Medical): No  Physical Activity: Insufficiently Active   Days of Exercise per Week: 1 day   Minutes of Exercise per Session: 20 min  Stress: No Stress Concern Present   Feeling of Stress : Not at all  Social Connections: Unknown   Frequency of Communication with Friends and Family: Not on file   Frequency of Social Gatherings with Friends and Family: More than three times a week   Attends Religious Services: Not on Electrical engineer or Organizations: Not on file   Attends Archivist Meetings: Not on file   Marital Status: Not on file    Tobacco Counseling Counseling given: Not Answered   Clinical Intake:  Pre-visit preparation completed: Yes        Diabetes: No  How often do you need to have someone help you when you read instructions, pamphlets, or other written materials from your doctor or pharmacy?: 3 - Sometimes   Interpreter Needed?: No      Activities of Daily Living In your present state of health, do you have any difficulty performing the following activities: 06/04/2021 12/12/2020  Hearing? N N  Vision? N N  Difficulty concentrating or making decisions? N N  Walking or climbing stairs? Y Y  Comment Paces self -  Dressing or  bathing? N N  Doing errands, shopping? Tempie Donning  Comment Daughter assist as needed -  Conservation officer, nature and eating ? Y -  Comment Daughter assist with meal prep. Self feeds. -  Using the Toilet? N -  In the past six months, have you accidently leaked urine? N -  Do you have problems with loss of bowel control? N -  Managing your Medications? N -  Managing your Finances? Y -  Comment Daughter assist as needed -  Housekeeping or managing your Housekeeping? Y -  Comment Daughter assist -  Some recent data might be hidden    Patient Care Team: McLean-Scocuzza, Nino Glow, MD as PCP - General (Internal Medicine)  Indicate any recent Medical Services you may have received from other than [facility] providers in the past year (date may be approximate).     Assessment:   This is a routine wellness examination for [facility].  Virtual Visit via Telephone Note  I connected with  Kelli Smith on 06/04/21 at  2:00 PM EST by telephone and verified that I am speaking with the correct person using two identifiers.  Location: Patient: home Provider: office Persons participating in the virtual visit: patient/daughter/Nurse Health Advisor   I discussed the limitations, risks, security and privacy concerns of performing an evaluation and management service by telephone and the availability of in person appointments. The patient expressed understanding and agreed to proceed.  Interactive audio and video telecommunications were attempted between this nurse and patient, however failed, due to patient having technical difficulties OR patient did not have access to video capability.  We continued and completed visit with audio only.  Some vital signs may be absent or patient reported.   OBrien-Blaney, Jeylin Woodmansee L, LPN   Hearing/Vision screen Hearing Screening - Comments:: Patient is able to hear conversational tones without difficulty. No issues reported. Vision Screening - Comments:: Followed by [facility], Dr.  Kerin Ransom  Wears glasses  Dietary issues and exercise activities discussed: Current Exercise Habits: Home exercise routine, Type of exercise: walking, Intensity: Mild Healthy diet  Good water intake   Goals Addressed             This Visit's Progress    Follow up with Primary Care Provider       As needed Stay hydrated       Depression Screen PHQ 2/9 Scores 06/04/2021 05/28/2020 04/05/2020 05/26/2019 09/15/2018  PHQ - 2 Score 0 0 0 0 0    Fall Risk Fall Risk  06/04/2021 02/06/2021 11/16/2020 11/02/2020 08/08/2020  Falls in the past year? 0 0 0 0 0  Number falls in past yr: 0 0 0 0 0  Injury with Fall? - 0 0 0 0  Follow up Falls evaluation completed Falls evaluation completed Falls evaluation completed Falls evaluation completed Falls evaluation completed    [place]: Adequate lighting in your home to reduce risk of falls? Yes   ASSISTIVE DEVICES UTILIZED TO PREVENT FALLS:  Life alert? No  Use of a cane, walker or w/c? Yes  Grab bars in the bathroom? Yes  Shower chair or bench in shower? Yes  Elevated toilet seat or a handicapped toilet? Yes   TIMED UP AND GO: Was the test performed? No . Virtual visit.   Cognitive Function:     6CIT Screen 06/04/2021 05/28/2020  What Year? 0 points 0 points  What month? 0 points 0 points  What time? 0 points -  Count back from 20 0 points -  Months in reverse 0 points 0 points  Repeat phrase 10 points 10 points  Total Score 10 -    Immunizations Immunization History  Administered Date(s) Administered   Fluad Quad(high Dose 65+) 05/26/2019   Influenza, High Dose Seasonal PF 04/22/2018, 04/21/2020   PFIZER(Purple Top)SARS-COV-2 Vaccination 09/12/2019, 10/03/2019, 08/07/2020   Pneumococcal Conjugate-13 04/22/2018   Pneumococcal Polysaccharide-23 06/17/2019   Tdap 05/20/2021   Shingrix Completed?: No.    Education has been provided regarding the  importance of this vaccine. Patient has been advised  to call insurance company to determine out of pocket expense if they have not yet received this vaccine. Advised may also receive vaccine at local pharmacy or Health Dept. Verbalized acceptance and understanding.  Screening Tests Health Maintenance  Topic Date Due   Zoster Vaccines- Shingrix (1 of 2) 09/04/2021 (Originally 06/18/1960)   COVID-19 Vaccine (4 - Booster for Pfizer series) 01/25/2022 (Originally 10/02/2020)   TETANUS/TDAP  05/21/2031   Pneumonia Vaccine 50+ Years old  Completed   INFLUENZA VACCINE  Completed   DEXA SCAN  Completed   HPV VACCINES  Aged Out    Health Maintenance  There are no preventive care reminders to display for this patient.  Lung Cancer Screening: (Low Dose CT Chest recommended if Age 82-80 years, 30 pack-year currently smoking OR have quit w/in 15years.) does not qualify.   Hepatitis C Screening: does not qualify.  Vision Screening: Recommended annual ophthalmology exams for early detection of glaucoma and other disorders of the eye.  Dental Screening: Recommended annual dental exams for proper oral hygiene.  Community Resource Referral / Chronic Care Management: CRR required this visit?  No   CCM required this visit?  No      Plan:   Keep all routine maintenance appointments.   I have personally reviewed and noted the following in the patient's chart:   Medical and social history Use of alcohol, tobacco or illicit drugs  Current medications and supplements including opioid prescriptions. Not taking opioid.  Functional ability and status Nutritional status Physical activity Advanced directives List of other physicians Hospitalizations, surgeries, and ER visits in previous 12 months Vitals Screenings to include cognitive, depression, and falls Referrals and appointments  In addition, I have reviewed and discussed with patient certain preventive protocols, quality metrics, and best practice recommendations. A written personalized care  plan for preventive services as well as general preventive health recommendations were provided to patient.     Varney Biles, LPN   [id number]

## 2021-06-11 DIAGNOSIS — Z96652 Presence of left artificial knee joint: Secondary | ICD-10-CM | POA: Diagnosis not present

## 2021-06-18 ENCOUNTER — Encounter: Payer: Self-pay | Admitting: Cardiology

## 2021-06-18 ENCOUNTER — Other Ambulatory Visit: Payer: Self-pay

## 2021-06-18 MED ORDER — RIVAROXABAN 20 MG PO TABS
20.0000 mg | ORAL_TABLET | Freq: Every day | ORAL | 5 refills | Status: DC
Start: 2021-06-18 — End: 2021-12-17

## 2021-06-18 NOTE — Telephone Encounter (Signed)
Pt last saw Dr Garen Lah on 05/20/21, last labs 02/06/21 Creat 0.71, age 80, weight 59.4kg, CrCl 58.27, based on CrCl pt is on appropriate dosage of Xarelto 20mg  QD for a-flutter.  Will refill rx.

## 2021-07-04 IMAGING — DX DG KNEE 1-2V PORT*L*
2 series · 2 of 2 positions shown · non-contrast
Comparison: 08/08/2020

CLINICAL DATA: Postop

EXAM:
PORTABLE LEFT KNEE - 1-2 VIEW

[knee ap]
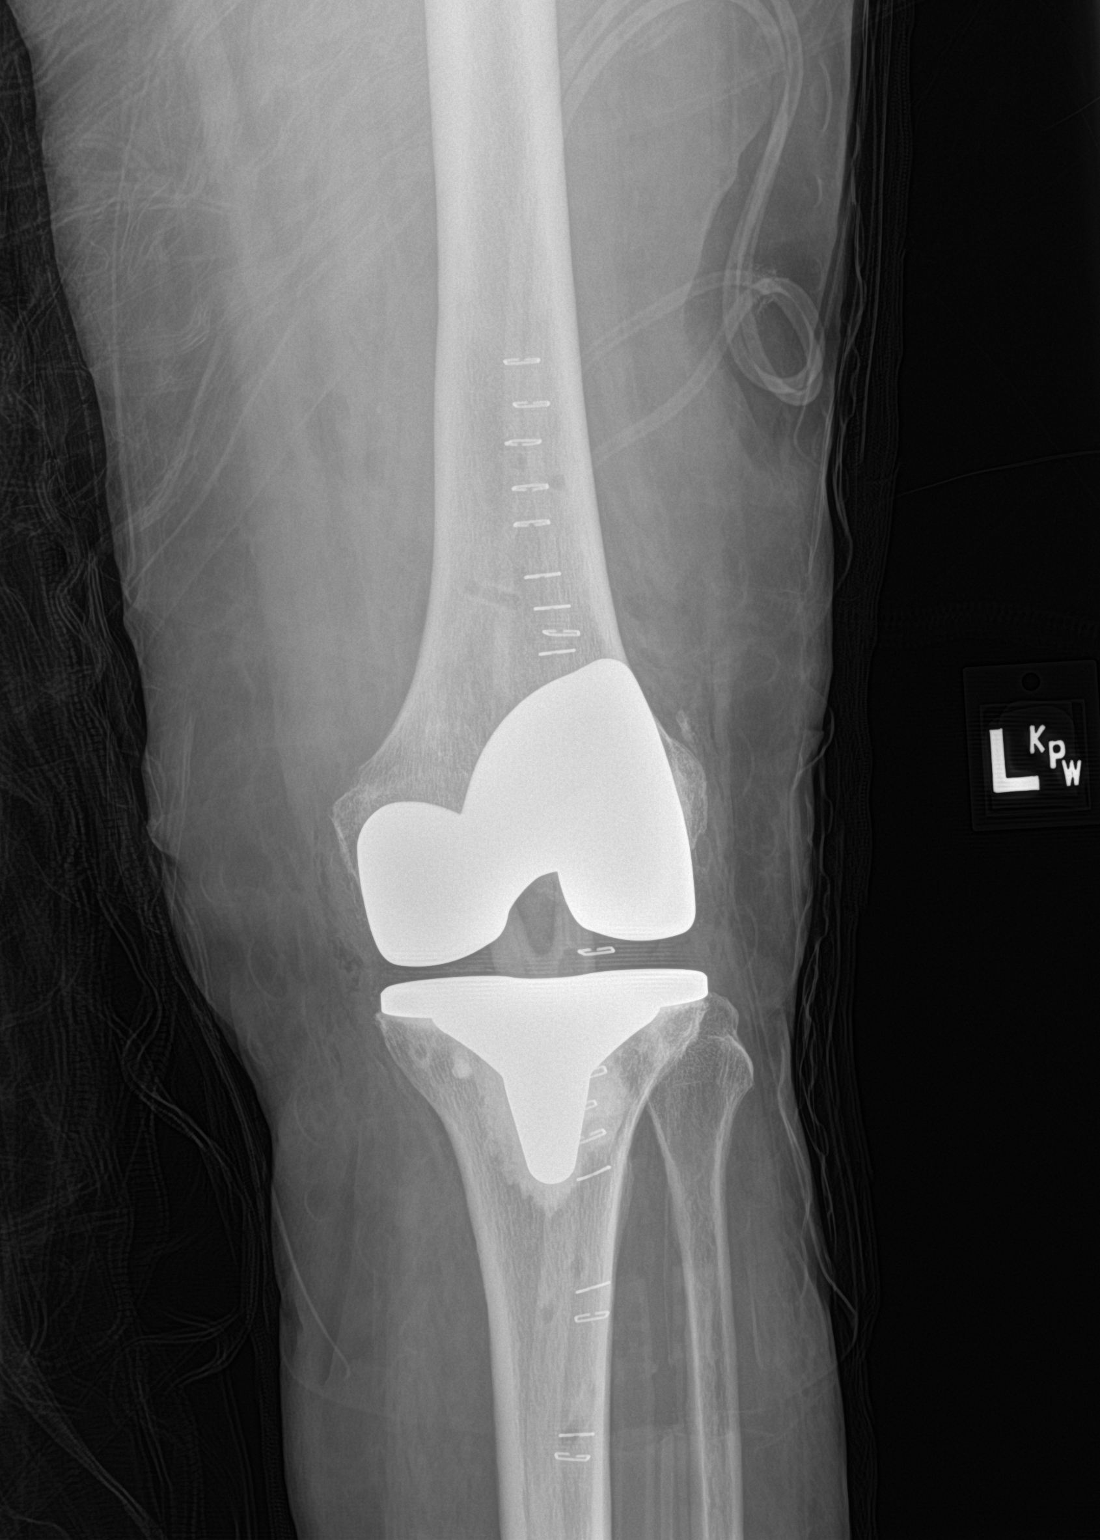

[knee lat]
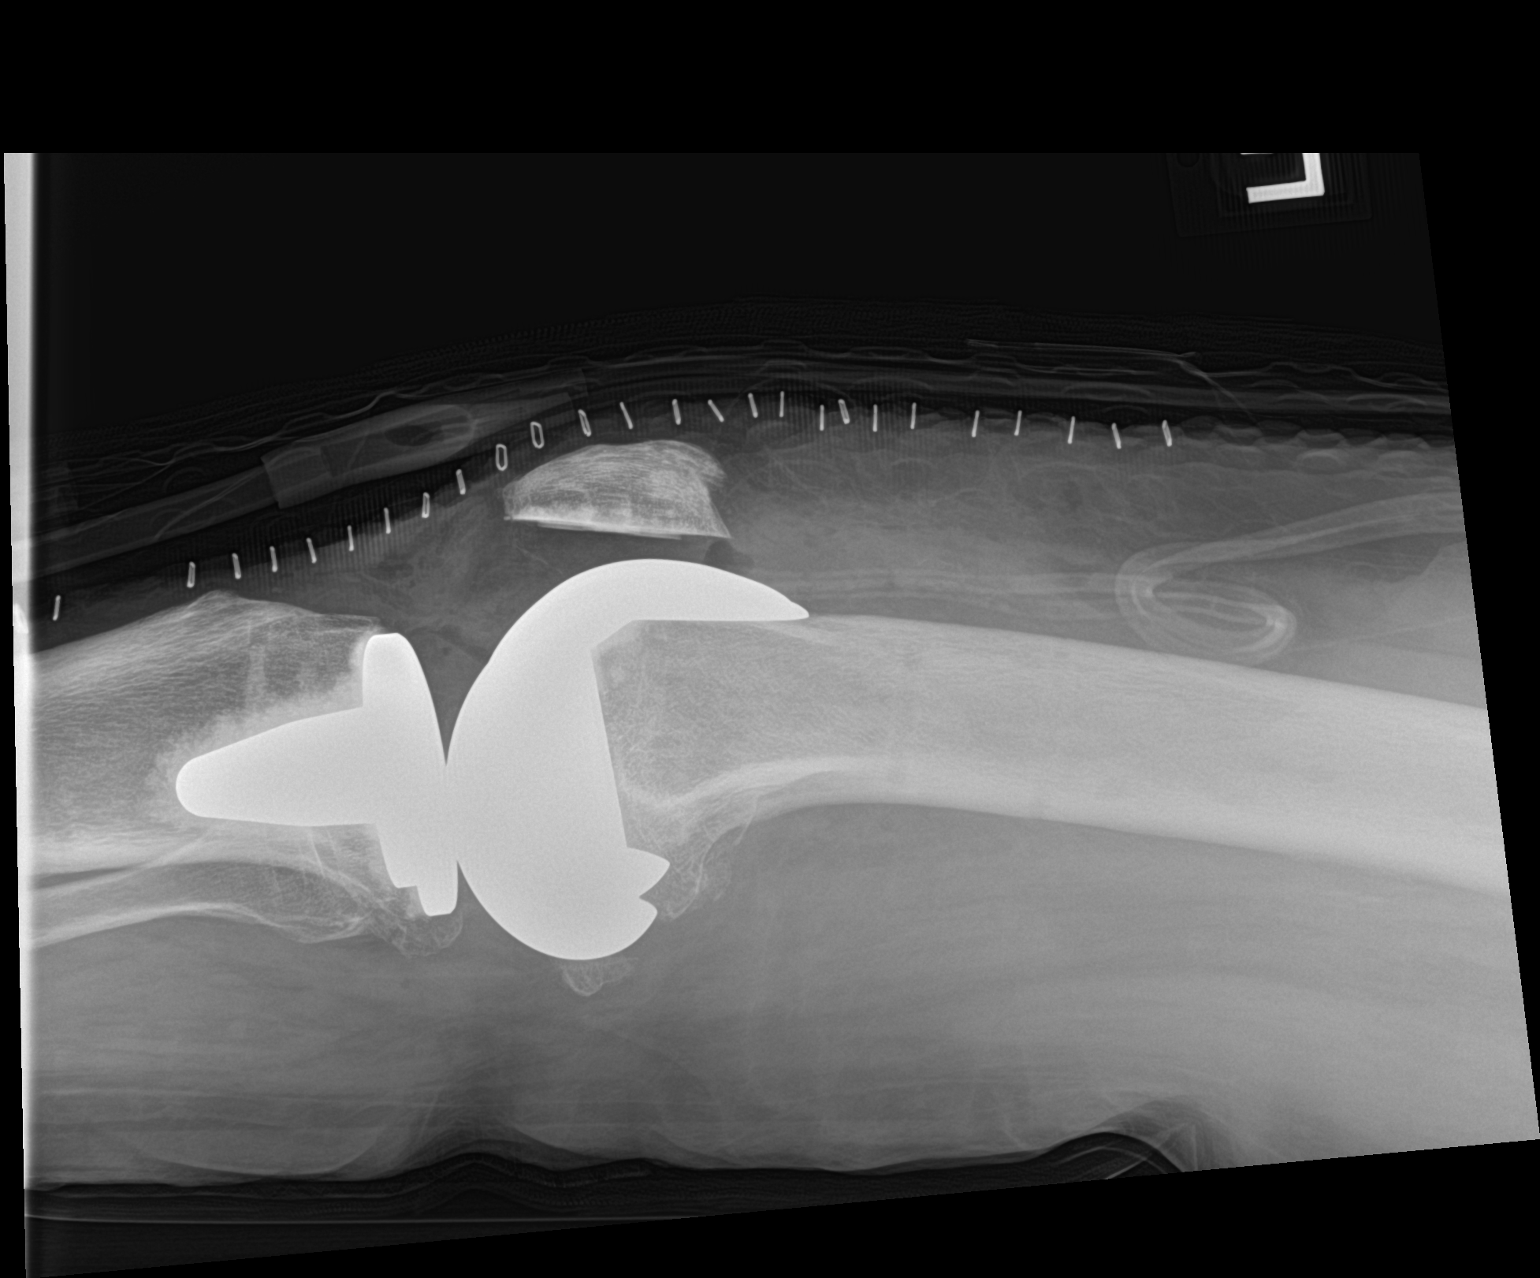

[2 of 2 positions shown; findings below may reference images not displayed]

FINDINGS: Interval left knee replacement with intact hardware and normal
alignment. No fracture is seen. Gas in the soft tissues consistent
with recent surgery. Suprapatellar drainage catheter
IMPRESSION: Interval left knee replacement with expected postsurgical change

## 2021-07-12 ENCOUNTER — Other Ambulatory Visit: Payer: Self-pay | Admitting: *Deleted

## 2021-07-12 MED ORDER — AMIODARONE HCL 200 MG PO TABS
200.0000 mg | ORAL_TABLET | ORAL | 0 refills | Status: DC
Start: 2021-07-12 — End: 2021-09-13

## 2021-08-08 DIAGNOSIS — S40021A Contusion of right upper arm, initial encounter: Secondary | ICD-10-CM | POA: Diagnosis not present

## 2021-08-08 DIAGNOSIS — M19011 Primary osteoarthritis, right shoulder: Secondary | ICD-10-CM | POA: Diagnosis not present

## 2021-08-08 DIAGNOSIS — S40022A Contusion of left upper arm, initial encounter: Secondary | ICD-10-CM | POA: Diagnosis not present

## 2021-08-09 ENCOUNTER — Ambulatory Visit: Payer: Medicare Other | Admitting: Internal Medicine

## 2021-08-15 ENCOUNTER — Ambulatory Visit: Payer: Medicare Other | Admitting: Internal Medicine

## 2021-08-29 ENCOUNTER — Encounter: Payer: Self-pay | Admitting: Podiatry

## 2021-08-29 ENCOUNTER — Ambulatory Visit: Payer: Medicare Other | Admitting: Internal Medicine

## 2021-08-29 ENCOUNTER — Ambulatory Visit (INDEPENDENT_AMBULATORY_CARE_PROVIDER_SITE_OTHER): Payer: Medicare Other | Admitting: Podiatry

## 2021-08-29 ENCOUNTER — Other Ambulatory Visit: Payer: Self-pay

## 2021-08-29 DIAGNOSIS — B351 Tinea unguium: Secondary | ICD-10-CM

## 2021-08-29 DIAGNOSIS — M79674 Pain in right toe(s): Secondary | ICD-10-CM

## 2021-08-29 DIAGNOSIS — M79675 Pain in left toe(s): Secondary | ICD-10-CM | POA: Diagnosis not present

## 2021-08-29 DIAGNOSIS — L84 Corns and callosities: Secondary | ICD-10-CM

## 2021-08-29 NOTE — Progress Notes (Signed)
This patient returns to the office for evaluation and treatment of long thick painful nails .  This patient is unable to trim her own nails since the patient cannot reach her feet.  Patient says the nails are painful walking and wearing her shoes.  She returns for preventive foot care services.  General Appearance  Alert, conversant and in no acute stress.  Vascular  Dorsalis pedis and posterior tibial  pulses are palpable  bilaterally.  Capillary return is within normal limits  bilaterally. Temperature is within normal limits  bilaterally.  Neurologic  Senn-Weinstein monofilament wire test within normal limits  bilaterally. Muscle power within normal limits bilaterally.  Nails Thick disfigured discolored nails with subungual debris  from hallux to fifth toes bilaterally. No evidence of bacterial infection or drainage bilaterally.  Pincer nail left hallux.  Orthopedic  No limitations of motion  feet .  No crepitus or effusions noted.  No bony pathology or digital deformities noted.  Skin  normotropic skin with no porokeratosis noted bilaterally.  No signs of infections or ulcers noted. Asymptomatic pinch callus.  Onychomycosis  Pain in toes right foot  Pain in toes left foot  Debridement  of nails  1-5  B/L with a nail nipper.  Nails were then filed using a dremel tool with no incidents.    RTC  12 weeks    Gardiner Barefoot DPM

## 2021-08-30 ENCOUNTER — Ambulatory Visit (INDEPENDENT_AMBULATORY_CARE_PROVIDER_SITE_OTHER): Payer: Medicare Other | Admitting: Internal Medicine

## 2021-08-30 ENCOUNTER — Encounter: Payer: Self-pay | Admitting: Internal Medicine

## 2021-08-30 ENCOUNTER — Ambulatory Visit (INDEPENDENT_AMBULATORY_CARE_PROVIDER_SITE_OTHER): Payer: Medicare Other

## 2021-08-30 VITALS — BP 118/66 | HR 82 | Temp 98.2°F | Ht 60.0 in | Wt 130.8 lb

## 2021-08-30 DIAGNOSIS — E611 Iron deficiency: Secondary | ICD-10-CM | POA: Diagnosis not present

## 2021-08-30 DIAGNOSIS — E559 Vitamin D deficiency, unspecified: Secondary | ICD-10-CM

## 2021-08-30 DIAGNOSIS — I10 Essential (primary) hypertension: Secondary | ICD-10-CM | POA: Insufficient documentation

## 2021-08-30 DIAGNOSIS — Z1231 Encounter for screening mammogram for malignant neoplasm of breast: Secondary | ICD-10-CM

## 2021-08-30 DIAGNOSIS — R413 Other amnesia: Secondary | ICD-10-CM

## 2021-08-30 DIAGNOSIS — M25511 Pain in right shoulder: Secondary | ICD-10-CM

## 2021-08-30 DIAGNOSIS — E538 Deficiency of other specified B group vitamins: Secondary | ICD-10-CM

## 2021-08-30 DIAGNOSIS — M7989 Other specified soft tissue disorders: Secondary | ICD-10-CM | POA: Diagnosis not present

## 2021-08-30 DIAGNOSIS — E010 Iodine-deficiency related diffuse (endemic) goiter: Secondary | ICD-10-CM | POA: Diagnosis not present

## 2021-08-30 DIAGNOSIS — I4891 Unspecified atrial fibrillation: Secondary | ICD-10-CM

## 2021-08-30 DIAGNOSIS — R946 Abnormal results of thyroid function studies: Secondary | ICD-10-CM | POA: Diagnosis not present

## 2021-08-30 DIAGNOSIS — R319 Hematuria, unspecified: Secondary | ICD-10-CM | POA: Diagnosis not present

## 2021-08-30 MED ORDER — TRAMADOL HCL 50 MG PO TABS: 50.0000 mg | ORAL_TABLET | Freq: Every evening | ORAL | 0 refills | Status: DC | PRN

## 2021-08-30 MED ORDER — DONEPEZIL HCL 5 MG PO TABS: 5.0000 mg | ORAL_TABLET | Freq: Every day | ORAL | 3 refills | Status: DC

## 2021-08-30 NOTE — Patient Instructions (Addendum)
Biofreeze, aspercream with lidocaine, heat/ice, lidocaine patch  Mention to Dr. Marry Guan   Donepezil Tablets What is this medication? DONEPEZIL (doe NEP e zil) treats memory loss and confusion (dementia) in people who have Alzheimer disease. It works by improving attention, memory, and the ability to engage in daily activities. It is not a cure for dementia or Alzheimer disease. This medicine may be used for other purposes; ask your health care provider or pharmacist if you have questions. COMMON BRAND NAME(S): Aricept What should I tell my care team before I take this medication? They need to know if you have any of these conditions: Asthma or other lung disease Difficulty passing urine Head injury Heart disease History of irregular heartbeat Liver disease Seizures (convulsions) Stomach or intestinal disease, ulcers or stomach bleeding An unusual or allergic reaction to donepezil, other medications, foods, dyes, or preservatives Pregnant or trying to get pregnant Breast-feeding How should I use this medication? Take this medication by mouth with a glass of water. Follow the directions on the prescription label. You may take this medication with or without food. Take this medication at regular intervals. This medication is usually taken before bedtime. Do not take it more often than directed. Continue to take your medication even if you feel better. Do not stop taking except on your care team's advice. If you are taking the 23 mg donepezil tablet, swallow it whole; do not cut, crush, or chew it. Talk to your care team about the use of this medication in children. Special care may be needed. Overdosage: If you think you have taken too much of this medicine contact a poison control center or emergency room at once. NOTE: This medicine is only for you. Do not share this medicine with others. What if I miss a dose? If you miss a dose, take it as soon as you can. If it is almost time for your  next dose, take only that dose, do not take double or extra doses. What may interact with this medication? Do not take this medication with any of the following: Certain medications for fungal infections like itraconazole, fluconazole, posaconazole, and voriconazole Cisapride Dextromethorphan; quinidine Dronedarone Pimozide Quinidine Thioridazine This medication may also interact with the following: Antihistamines for allergy, cough and cold Atropine Bethanechol Carbamazepine Certain medications for bladder problems like oxybutynin, tolterodine Certain medications for Parkinson's disease like benztropine, trihexyphenidyl Certain medications for stomach problems like dicyclomine, hyoscyamine Certain medications for travel sickness like scopolamine Dexamethasone Dofetilide Ipratropium NSAIDs, medications for pain and inflammation, like ibuprofen or naproxen Other medications for Alzheimer's disease Other medications that prolong the QT interval (cause an abnormal heart rhythm) Phenobarbital Phenytoin Rifampin, rifabutin or rifapentine Ziprasidone This list may not describe all possible interactions. Give your health care provider a list of all the medicines, herbs, non-prescription drugs, or dietary supplements you use. Also tell them if you smoke, drink alcohol, or use illegal drugs. Some items may interact with your medicine. What should I watch for while using this medication? Visit your care team for regular checks on your progress. Check with your care team if your symptoms do not get better or if they get worse. You may get drowsy or dizzy. Do not drive, use machinery, or do anything that needs mental alertness until you know how this medication affects you. What side effects may I notice from receiving this medication? Side effects that you should report to your care team as soon as possible: Allergic reactions--skin rash, itching, hives, swelling of the face,  lips, tongue, or  throat Peptic ulcer--burning stomach pain, loss of appetite, bloating, burping, heartburn, nausea, vomiting Seizures Slow heartbeat--dizziness, feeling faint or lightheaded, confusion, trouble breathing, unusual weakness or fatigue Stomach bleeding--bloody or black, tar-like stools, vomiting blood or brown material that looks like coffee grounds Trouble passing urine Side effects that usually do not require medical attention (report these to your care team if they continue or are bothersome): Diarrhea Fatigue Loss of appetite Muscle pain or cramps Nausea Trouble sleeping This list may not describe all possible side effects. Call your doctor for medical advice about side effects. You may report side effects to FDA at 1-800-FDA-1088. Where should I keep my medication? Keep out of reach of children. Store at room temperature between 15 and 30 degrees C (59 and 86 degrees F). Throw away any unused medication after the expiration date. NOTE: This sheet is a summary. It may not cover all possible information. If you have questions about this medicine, talk to your doctor, pharmacist, or health care provider.  2022 Elsevier/Gold Standard (2021-02-27 00:00:00)  Management of Memory Problems  There are some general things you can do to help manage your memory problems.  Your memory may not in fact recover, but by using techniques and strategies you will be able to manage your memory difficulties better.  1)  Establish a routine. Try to establish and then stick to a regular routine.  By doing this, you will get used to what to expect and you will reduce the need to rely on your memory.  Also, try to do things at the same time of day, such as taking your medication or checking your calendar first thing in the morning. Think about think that you can do as a part of a regular routine and make a list.  Then enter them into a daily planner to remind you.  This will help you establish a routine.  2)   Organize your environment. Organize your environment so that it is uncluttered.  Decrease visual stimulation.  Place everyday items such as keys or cell phone in the same place every day (ie.  Basket next to front door) Use post it notes with a brief message to yourself (ie. Turn off light, lock the door) Use labels to indicate where things go (ie. Which cupboards are for food, dishes, etc.) Keep a notepad and pen by the telephone to take messages  3)  Memory Aids A diary or journal/notebook/daily planner Making a list (shopping list, chore list, to do list that needs to be done) Using an alarm as a reminder (kitchen timer or cell phone alarm) Using cell phone to store information (Notes, Calendar, Reminders) Calendar/White board placed in a prominent position Post-it notes  In order for memory aids to be useful, you need to have good habits.  It's no good remembering to make a note in your journal if you don't remember to look in it.  Try setting aside a certain time of day to look in journal.  4)  Improving mood and managing fatigue. There may be other factors that contribute to memory difficulties.  Factors, such as anxiety, depression and tiredness can affect memory. Regular gentle exercise can help improve your mood and give you more energy. Simple relaxation techniques may help relieve symptoms of anxiety Try to get back to completing activities or hobbies you enjoyed doing in the past. Learn to pace yourself through activities to decrease fatigue. Find out about some local support groups where you can  share experiences with others. Try and achieve 7-8 hours of sleep at night. Memory Compensation Strategies  Use "WARM" strategy.  W= write it down  A= associate it  R= repeat it  M= make a mental note  2.   You can keep a Social worker.  Use a 3-ring notebook with sections for the following: calendar, important names and phone numbers,  medications, doctors' names/phone  numbers, lists/reminders, and a section to journal what you did  each day.   3.    Use a calendar to write appointments down.  4.    Write yourself a schedule for the day.  This can be placed on the calendar or in a separate section of the Memory Notebook.  Keeping a  regular schedule can help memory.  5.    Use medication organizer with sections for each day or morning/evening pills.  You may need help loading it  6.    Keep a basket, or pegboard by the door.  Place items that you need to take out with you in the basket or on the pegboard.  You may also want to  include a message board for reminders.  7.    Use sticky notes.  Place sticky notes with reminders in a place where the task is performed.  For example: " turn off the  stove" placed by the stove, "lock the door" placed on the door at eye level, " take your medications" on  the bathroom mirror or by the place where you normally take your medications.  8.    Use alarms/timers.  Use while cooking to remind yourself to check on food or as a reminder to take your medicine, or as a  reminder to make a call, or as a reminder to perform another task, etc.  Shoulder Pain Many things can cause shoulder pain, including: An injury to the shoulder. Overuse of the shoulder. Arthritis. The source of the pain can be: Inflammation. An injury to the shoulder joint. An injury to a tendon, ligament, or bone. Follow these instructions at home: Pay attention to changes in your symptoms. Let your health care provider know about them. Follow these instructions to relieve your pain. If you have a sling: Wear the sling as told by your health care provider. Remove it only as told by your health care provider. Loosen the sling if your fingers tingle, become numb, or turn cold and blue. Keep the sling clean. If the sling is not waterproof: Do not let it get wet. Remove it to shower or bathe. Move your arm as little as possible, but keep your hand  moving to prevent swelling. Managing pain, stiffness, and swelling  If directed, put ice on the painful area: Put ice in a plastic bag. Place a towel between your skin and the bag. Leave the ice on for 20 minutes, 2-3 times per day. Stop applying ice if it does not help with the pain. Squeeze a soft ball or a foam pad as much as possible. This helps to keep the shoulder from swelling. It also helps to strengthen the arm. General instructions Take over-the-counter and prescription medicines only as told by your health care provider. Keep all follow-up visits as told by your health care provider. This is important. Contact a health care provider if: Your pain gets worse. Your pain is not relieved with medicines. New pain develops in your arm, hand, or fingers. Get help right away if: Your arm, hand, or fingers: Tingle. Become  numb. Become swollen. Become painful. Turn white or blue. Summary Shoulder pain can be caused by an injury, overuse, or arthritis. Pay attention to changes in your symptoms. Let your health care provider know about them. This condition may be treated with a sling, ice, and pain medicines. Contact your health care provider if the pain gets worse or new pain develops. Get help right away if your arm, hand, or fingers tingle or become numb, swollen, or painful. Keep all follow-up visits as told by your health care provider. This is important. This information is not intended to replace advice given to you by your health care provider. Make sure you discuss any questions you have with your health care provider. Document Revised: 01/26/2018 Document Reviewed: 01/26/2018 Elsevier Patient Education  2022 [place].  Shoulder Range of Motion Exercises Shoulder range of motion (ROM) exercises are done to keep the shoulder moving freely or to increase movement. They are often recommended for people who have shoulder pain or stiffness or who are recovering from a  shoulder surgery. Phase 1 exercises When you are able, do this exercise 1-2 times per day for 30-60 seconds in each direction, or as directed by your health care provider. Pendulum exercise To do this exercise while sitting: Sit in a chair or at the edge of your bed with your feet flat on the floor. Let your affected arm hang down in front of you over the edge of the bed or chair. Relax your shoulder, arm, and hand. Rock your body so your arm gently swings in small circles. You can also use your unaffected arm to start the motion. Repeat changing the direction of the circles, swinging your arm left and right, and swinging your arm forward and back. To do this exercise while standing: Stand next to a sturdy chair or table, and hold on to it with your hand on your unaffected side. Bend forward at the waist. Bend your knees slightly. Relax your shoulder, arm, and hand. While keeping your shoulder relaxed, use body motion to swing your arm in small circles. Repeat changing the direction of the circles, swinging your arm left and right, and swinging your arm forward and back. Between exercises, stand up tall and take a short break to relax your lower back.  Phase 2 exercises Do these exercises 1-2 times per day or as told by your health care provider. Hold each stretch for 30 seconds, and repeat 3 times. Do the exercises with one or both arms as instructed by your health care provider. For these exercises, sit at a table with your hand and arm supported by the table. A chair that slides easily or has wheels can be helpful. External rotation Turn your chair so that your affected side is nearest to the table. Place your forearm on the table to your side. Bend your elbow about 90 at the elbow (right angle) and place your hand palm facing down on the table. Your elbow should be about 6 inches away from your side. Keeping your arm on the table, lean your body forward. Abduction Turn your chair so  that your affected side is nearest to the table. Place your forearm and hand on the table so that your thumb points toward the ceiling and your arm is straight out to your side. Slide your hand out to the side and away from you, using your unaffected arm to do the work. To increase the stretch, you can slide your chair away from the table. Flexion:  forward stretch Sit facing the table. Place your hand and elbow on the table in front of you. Slide your hand forward and away from you, using your unaffected arm to do the work. To increase the stretch, you can slide your chair backward. Phase 3 exercises Do these exercises 1-2 times per day or as told by your health care provider. Hold each stretch for 30 seconds, and repeat 3 times. Do the exercises with one or both arms as instructed by your health care provider. Cross-body stretch: posterior capsule stretch Lift your arm straight out in front of you. Bend your arm 90 at the elbow (right angle) so your forearm moves across your body. Use your other arm to gently pull the elbow across your body, toward your other shoulder. Wall climbs Stand with your affected arm extended out to the side with your hand resting on a door frame. Slide your hand slowly up the door frame. To increase the stretch, step through the door frame. Keep your body upright and do not lean. Wand exercises You will need a cane, a piece of PVC pipe, or a sturdy wooden dowel for wand exercises. Flexion To do this exercise while standing: Hold the wand with both of your hands, palms down. Using the other arm to help, lift your arms up and over your head, if able. Push upward with your other arm to gently increase the stretch. To do this exercise while lying down: Lie on your back with your elbows resting on the floor and the wand in both your hands. Your hands will be palm down, or pointing toward your feet. Lift your hands toward the ceiling, using your unaffected arm to  help if needed. Bring your arms overhead as able, using your unaffected arm to help if needed. Internal rotation Stand while holding the wand behind you with both hands. Your unaffected arm should be extended above your head with the arm of the affected side extended behind you at the level of your waist. The wand should be pointing straight up and down as you hold it. Slowly pull the wand up behind your back by straightening the elbow of your unaffected arm and bending the elbow of your affected arm. External rotation Lie on your back with your affected upper arm supported on a small pillow or rolled towel. When you first do this exercise, keep your upper arm close to your body. Over time, bring your arm up to a 90 angle out to the side. Hold the wand across your stomach and with both hands palm up. Your elbow on your affected side should be bent at a 90 angle. Use your unaffected side to help push your forearm away from you and toward the floor. Keep your elbow on your affected side bent at a 90 angle. Contact a health care provider if you have: New or increasing pain. New numbness, tingling, weakness, or discoloration in your arm or hand. This information is not intended to replace advice given to you by your health care provider. Make sure you discuss any questions you have with your health care provider. Document Revised: 08/26/2017 Document Reviewed: 08/26/2017 Elsevier Patient Education  2022 [place].  Shoulder Exercises Ask your health care provider which exercises are safe for you. Do exercises exactly as told by your health care provider and adjust them as directed. It is normal to feel mild stretching, pulling, tightness, or discomfort as you do these exercises. Stop right away if you feel sudden pain or  your pain gets worse. Do not begin these exercises until told by your health care provider. Stretching exercises External rotation and abduction This exercise is sometimes  called corner stretch. This exercise rotates your arm outward (external rotation) and moves your arm out from your body (abduction). Stand in a doorway with one of your feet slightly in front of the other. This is called a staggered stance. If you cannot reach your forearms to the door frame, stand facing a corner of a room. Choose one of the following positions as told by your health care provider: Place your hands and forearms on the door frame above your head. Place your hands and forearms on the door frame at the height of your head. Place your hands on the door frame at the height of your elbows. Slowly move your weight onto your front foot until you feel a stretch across your chest and in the front of your shoulders. Keep your head and chest upright and keep your abdominal muscles tight. Hold for __________ seconds. To release the stretch, shift your weight to your back foot. Repeat __________ times. Complete this exercise __________ times a day. Extension, standing Stand and hold a broomstick, a cane, or a similar object behind your back. Your hands should be a little wider than shoulder width apart. Your palms should face away from your back. Keeping your elbows straight and your shoulder muscles relaxed, move the stick away from your body until you feel a stretch in your shoulders (extension). Avoid shrugging your shoulders while you move the stick. Keep your shoulder blades tucked down toward the middle of your back. Hold for __________ seconds. Slowly return to the starting position. Repeat __________ times. Complete this exercise __________ times a day. Range-of-motion exercises Pendulum  Stand near a wall or a surface that you can hold onto for balance. Bend at the waist and let your left / right arm hang straight down. Use your other arm to support you. Keep your back straight and do not lock your knees. Relax your left / right arm and shoulder muscles, and move your hips and  your trunk so your left / right arm swings freely. Your arm should swing because of the motion of your body, not because you are using your arm or shoulder muscles. Keep moving your hips and trunk so your arm swings in the following directions, as told by your health care provider: Side to side. Forward and backward. In clockwise and counterclockwise circles. Continue each motion for __________ seconds, or for as long as told by your health care provider. Slowly return to the starting position. Repeat __________ times. Complete this exercise __________ times a day. Shoulder flexion, standing  Stand and hold a broomstick, a cane, or a similar object. Place your hands a little more than shoulder width apart on the object. Your left / right hand should be palm up, and your other hand should be palm down. Keep your elbow straight and your shoulder muscles relaxed. Push the stick up with your healthy arm to raise your left / right arm in front of your body, and then over your head until you feel a stretch in your shoulder (flexion). Avoid shrugging your shoulder while you raise your arm. Keep your shoulder blade tucked down toward the middle of your back. Hold for __________ seconds. Slowly return to the starting position. Repeat __________ times. Complete this exercise __________ times a day. Shoulder abduction, standing Stand and hold a broomstick, a cane, or a  similar object. Place your hands a little more than shoulder width apart on the object. Your left / right hand should be palm up, and your other hand should be palm down. Keep your elbow straight and your shoulder muscles relaxed. Push the object across your body toward your left / right side. Raise your left / right arm to the side of your body (abduction) until you feel a stretch in your shoulder. Do not raise your arm above shoulder height unless your health care provider tells you to do that. If directed, raise your arm over your  head. Avoid shrugging your shoulder while you raise your arm. Keep your shoulder blade tucked down toward the middle of your back. Hold for __________ seconds. Slowly return to the starting position. Repeat __________ times. Complete this exercise __________ times a day. Internal rotation  Place your left / right hand behind your back, palm up. Use your other hand to dangle an exercise band, a towel, or a similar object over your shoulder. Grasp the band with your left / right hand so you are holding on to both ends. Gently pull up on the band until you feel a stretch in the front of your left / right shoulder. The movement of your arm toward the center of your body is called internal rotation. Avoid shrugging your shoulder while you raise your arm. Keep your shoulder blade tucked down toward the middle of your back. Hold for __________ seconds. Release the stretch by letting go of the band and lowering your hands. Repeat __________ times. Complete this exercise __________ times a day. Strengthening exercises External rotation  Sit in a stable chair without armrests. Secure an exercise band to a stable object at elbow height on your left / right side. Place a soft object, such as a folded towel or a small pillow, between your left / right upper arm and your body to move your elbow about 4 inches (10 cm) away from your side. Hold the end of the exercise band so it is tight and there is no slack. Keeping your elbow pressed against the soft object, slowly move your forearm out, away from your abdomen (external rotation). Keep your body steady so only your forearm moves. Hold for __________ seconds. Slowly return to the starting position. Repeat __________ times. Complete this exercise __________ times a day. Shoulder abduction  Sit in a stable chair without armrests, or stand up. Hold a __________ weight in your left / right hand, or hold an exercise band with both hands. Start with your  arms straight down and your left / right palm facing in, toward your body. Slowly lift your left / right hand out to your side (abduction). Do not lift your hand above shoulder height unless your health care provider tells you that this is safe. Keep your arms straight. Avoid shrugging your shoulder while you do this movement. Keep your shoulder blade tucked down toward the middle of your back. Hold for __________ seconds. Slowly lower your arm, and return to the starting position. Repeat __________ times. Complete this exercise __________ times a day. Shoulder extension Sit in a stable chair without armrests, or stand up. Secure an exercise band to a stable object in front of you so it is at shoulder height. Hold one end of the exercise band in each hand. Your palms should face each other. Straighten your elbows and lift your hands up to shoulder height. Step back, away from the secured end of the exercise band,  until the band is tight and there is no slack. Squeeze your shoulder blades together as you pull your hands down to the sides of your thighs (extension). Stop when your hands are straight down by your sides. Do not let your hands go behind your body. Hold for __________ seconds. Slowly return to the starting position. Repeat __________ times. Complete this exercise __________ times a day. Shoulder row Sit in a stable chair without armrests, or stand up. Secure an exercise band to a stable object in front of you so it is at waist height. Hold one end of the exercise band in each hand. Position your palms so that your thumbs are facing the ceiling (neutral position). Bend each of your elbows to a 90-degree angle (right angle) and keep your upper arms at your sides. Step back until the band is tight and there is no slack. Slowly pull your elbows back behind you. Hold for __________ seconds. Slowly return to the starting position. Repeat __________ times. Complete this exercise  __________ times a day. Shoulder press-ups  Sit in a stable chair that has armrests. Sit upright, with your feet flat on the floor. Put your hands on the armrests so your elbows are bent and your fingers are pointing forward. Your hands should be about even with the sides of your body. Push down on the armrests and use your arms to lift yourself off the chair. Straighten your elbows and lift yourself up as much as you comfortably can. Move your shoulder blades down, and avoid letting your shoulders move up toward your ears. Keep your feet on the ground. As you get stronger, your feet should support less of your body weight as you lift yourself up. Hold for __________ seconds. Slowly lower yourself back into the chair. Repeat __________ times. Complete this exercise __________ times a day. Wall push-ups  Stand so you are facing a stable wall. Your feet should be about one arm-length away from the wall. Lean forward and place your palms on the wall at shoulder height. Keep your feet flat on the floor as you bend your elbows and lean forward toward the wall. Hold for __________ seconds. Straighten your elbows to push yourself back to the starting position. Repeat __________ times. Complete this exercise __________ times a day. This information is not intended to replace advice given to you by your health care provider. Make sure you discuss any questions you have with your health care provider. Document Revised: 11/05/2018 Document Reviewed: 08/13/2018 Elsevier Patient Education  [place].

## 2021-08-30 NOTE — Progress Notes (Addendum)
Chief Complaint  Patient presents with   Follow-up   F/u with daughter Elenor Quinones  1. Right shoulder pain had deep tissue massage > 2 weeks ago on xarelto and had significant swelling limited ROM but limited ROM before massage and pain 3/10 pain tried otc biofreeze, lidocaine pain patch w/o help swelling has gone down  Area was significantly bruise but this is now improved 2. Pending tbd right knee replacement 10/28/21 Dr. Marry Guan will disc #1 with him as well  3. Daughter c/w short term memory forgetful and recently could not remember her SS# and tried otc prevagen w/o help    Review of Systems  Constitutional:  Negative for weight loss.  HENT:  Negative for hearing loss.   Eyes:  Negative for blurred vision.  Respiratory:  Negative for shortness of breath.   Cardiovascular:  Negative for chest pain.  Gastrointestinal:  Negative for abdominal pain and blood in stool.  Genitourinary:  Negative for dysuria.  Musculoskeletal:  Positive for joint pain. Negative for falls.  Skin:  Negative for rash.  Neurological:  Negative for headaches.  Psychiatric/Behavioral:  Negative for depression.   Past Medical History:  Diagnosis Date   A-fib ([place])    Anemia    Aortic atherosclerosis ([facility])    Arthritis    knees, right shoulder    CAD (coronary artery disease)    DDD (degenerative disc disease), lumbar    History of chicken pox    History of kidney stones    H/O   PAC (premature atrial contraction)    UTI (urinary tract infection)    Past Surgical History:  Procedure Laterality Date   CESAREAN SECTION     1978   EYE SURGERY     cataract 2013/2014 b/l    FOOT SURGERY     bunion an dhammer toe in 2009   KNEE ARTHROPLASTY Left 12/12/2020   Procedure: COMPUTER ASSISTED TOTAL KNEE ARTHROPLASTY;  Surgeon: Dereck Leep, MD;  Location: [facility] ORS;  Service: Orthopedics;  Laterality: Left;   TEE WITHOUT CARDIOVERSION N/A 11/07/2020   Procedure: TRANSESOPHAGEAL ECHOCARDIOGRAM (TEE);  Surgeon:  Kate Sable, MD;  Location: [facility] OR[facility];  Service: Cardiovascular;  Laterality: N/A;   Family History  Problem Relation Age of Onset   Heart disease Mother        died when pt was 35 y.o    Heart Problems Mother    Heart disease Father    Alzheimer's disease Sister    Social History   Socioeconomic History   Marital status: Widowed    Spouse name: Not on file   Number of children: Not on file   Years of education: Not on file   Highest education level: Not on file  Occupational History   Not on file  Tobacco Use   Smoking status: Never   Smokeless tobacco: Never  Vaping Use   Vaping Use: Never used  Substance and Sexual Activity   Alcohol use: Not Currently   Drug use: Never   Sexual activity: Not on file  Other Topics Concern   Not on file  Social History Narrative   From [place] lived in [place] since late 1990s early 2000    Lives with daughter    Secretary/administrator ed    Former Pharmacist, hospital    No guns, wears seat belt, safe in relationship    Widowed       2 daughters 1/2 in [employer]    Social Determinants of Health   Financial Resource Strain: Low  Risk    Difficulty of Paying Living Expenses: Not hard at all  Food Insecurity: No Food Insecurity   Worried About [place] in the Last Year: Never true   Ran Out of Food in the Last Year: Never true  Transportation Needs: No Transportation Needs   Lack of Transportation (Medical): No   Lack of Transportation (Non-Medical): No  Physical Activity: Insufficiently Active   Days of Exercise per Week: 1 day   Minutes of Exercise per Session: 20 min  Stress: No Stress Concern Present   Feeling of Stress : Not at all  Social Connections: Unknown   Frequency of Communication with Friends and Family: Not on file   Frequency of Social Gatherings with Friends and Family: More than three times a week   Attends Religious Services: Not on Electrical engineer or Organizations: Not on file   Attends Theatre manager Meetings: Not on file   Marital Status: Not on file  Intimate Partner Violence: Not At Risk   Fear of Current or Ex-Partner: No   Emotionally Abused: No   Physically Abused: No   Sexually Abused: No   Current Meds  Medication Sig   acetaminophen (TYLENOL) 650 MG CR tablet Take by mouth.   amiodarone (PACERONE) 200 MG tablet Take 1 tablet (200 mg total) by mouth every morning.   B Complex-C-Folic Acid TABS Take 1 tablet by mouth daily.   Calcium Carbonate-Vit D-Min (CALCIUM 600+D3 PLUS MINERALS) 600-800 MG-UNIT TABS 1 tablet daily.   Calcium-Magnesium-Vitamin D (CALCIUM 1200+D3 PO) Take 1 tablet by mouth daily.   cholecalciferol (VITAMIN D3) 25 MCG (1000 UNIT) tablet Take 1,000 Units by mouth daily.   donepezil (ARICEPT) 5 MG tablet Take 1 tablet (5 mg total) by mouth at bedtime.   Ferrous Sulfate (IRON) 325 (65 Fe) MG TABS Take 325 mg by mouth daily.   furosemide (LASIX) 20 MG tablet Take 1 tablet (20 mg total) by mouth daily as needed. For swelling and shortness of breath.   Multiple Vitamins-Minerals (MULTIVITAMIN WITH MINERALS) tablet Take 1 tablet by mouth daily.   Omega-3 Fatty Acids (FISH OIL) 1200 MG CPDR Take 1 capsule by mouth daily.   OVER THE COUNTER MEDICATION Take 1 tablet by mouth daily. COGNIUM MEMORY   rivaroxaban (XARELTO) 20 MG TABS tablet Take 1 tablet (20 mg total) by mouth daily with supper.   vitamin B-12 (CYANOCOBALAMIN) 100 MCG tablet Take 100 mcg by mouth daily.   [DISCONTINUED] traMADol (ULTRAM) 50 MG tablet Take 1 tablet (50 mg total) by mouth every 4 (four) hours as needed for moderate pain.   No Known Allergies No results found for this or any previous visit (from the past 2160 hour(s)). Objective  Body mass index is 25.55 kg/m. Wt Readings from Last 3 Encounters:  08/30/21 130 lb 12.8 oz (59.3 kg)  06/04/21 131 lb (59.4 kg)  05/20/21 131 lb (59.4 kg)   Temp Readings from Last 3 Encounters:  08/30/21 98.2 F (36.8 C) (Oral)  02/06/21  98.1 F (36.7 C) (Oral)  12/14/20 98.3 F (36.8 C) (Oral)   BP Readings from Last 3 Encounters:  08/30/21 118/66  05/20/21 (!) 144/66  02/06/21 (!) 146/68   Pulse Readings from Last 3 Encounters:  08/30/21 82  05/20/21 75  02/06/21 83    Physical Exam Vitals and nursing note reviewed.  Constitutional:      Appearance: Normal appearance. She is well-developed and well-groomed.  HENT:  Head: Normocephalic and atraumatic.  Eyes:     Conjunctiva/sclera: Conjunctivae normal.     Pupils: Pupils are equal, round, and reactive to light.  Cardiovascular:     Rate and Rhythm: Normal rate and regular rhythm.     Heart sounds: Normal heart sounds. No murmur heard. Pulmonary:     Effort: Pulmonary effort is normal.     Breath sounds: Normal breath sounds.  Abdominal:     General: Abdomen is flat. Bowel sounds are normal.     Tenderness: There is no abdominal tenderness.  Musculoskeletal:     Right shoulder: Swelling, deformity and tenderness present. Decreased range of motion.  Skin:    General: Skin is warm and dry.  Neurological:     General: No focal deficit present.     Mental Status: She is alert and oriented to person, place, and time. Mental status is at baseline.     Cranial Nerves: Cranial nerves 2-12 are intact.     Gait: Gait is intact.  Psychiatric:        Attention and Perception: Attention and perception normal.        Mood and Affect: Mood and affect normal.        Speech: Speech normal.        Behavior: Behavior normal. Behavior is cooperative.        Thought Content: Thought content normal.        Cognition and Memory: Cognition and memory normal.        Judgment: Judgment normal.    Assessment  Plan  Acute pain of right shoulder - Plan: traMADol (ULTRAM) 50 MG tablet, DG Shoulder Right F/u Dr. Marry Guan 10/28/21   Memory loss - Plan: donepezil (ARICEPT) 5 MG tablet Consider titration up and neurology in the future + add namenda   Atrial fibrillation,  unspecified type ([place]) Check labs 09/2021   Hematuria, unspecified type - Plan: Urinalysis, Routine w reflex microscopic, Urine Culture   Abnormal thyroid function test - Plan: TSH, T4, free Thyromegaly  Hypertension, unspecified type - Plan: Comprehensive metabolic panel, Lipid panel, CBC with Differential/Platelet On norvasc 2.5 mg qd rx but pt not taking   Iron deficiency - Plan: IBC + Ferritin  Vitamin D deficiency - Plan: Vitamin D (25 hydroxy)  B12 deficiency - Plan: Vitamin B12   HM Flu shot  and prevnar utd and pna 23 Consider shingrix vaccine and Tdap 05/20/21 covid 3/3 consider booster 4th dose    Never smoker    Pap out of age window cologuard neg 09/2018 Mammogram negative 09/24/20 ordered  dexa 12/2018 osteopenia on calcium 1200 mg qd and vitamin D3 1000 to 2000 iu daily  Never had colonoscopy    Skin Dr. Kellie Moor h/o [facility] nose appt sch 12/05/20  Surgery left knee 12/12/20 Dr. Marry Guan arthritis another appt right knee 10/28/21   Provider: Dr. Olivia Mackie McLean-Scocuzza-Internal Medicine

## 2021-09-13 ENCOUNTER — Other Ambulatory Visit: Payer: Self-pay | Admitting: *Deleted

## 2021-09-13 NOTE — Telephone Encounter (Signed)
Lmovm to verify pharmacy.

## 2021-09-13 NOTE — Telephone Encounter (Signed)
Patient daughter called  States [facility] is [facility] [address] - in [employer]  Patient will be in [place] states until March 18th

## 2021-09-16 MED ORDER — AMIODARONE HCL 200 MG PO TABS: 200.0000 mg | ORAL_TABLET | ORAL | 0 refills | Status: DC

## 2021-10-06 NOTE — Discharge Instructions (Signed)
Instructions after Total Knee Replacement   Kelli Smith, Jr., M.D.     Dept. of Orthopaedics & Sports Medicine  [facility]  [address]  [place], [place]  [address]  Phone: [phone]   Fax: [phone]    DIET: Drink plenty of non-alcoholic fluids. Resume your normal diet. Include foods high in fiber.  ACTIVITY:  You may use crutches or a walker with weight-bearing as tolerated, unless instructed otherwise. You may be weaned off of the walker or crutches by your Physical Therapist.  Do NOT place pillows under the knee. Anything placed under the knee could limit your ability to straighten the knee.   Continue doing gentle exercises. Exercising will reduce the pain and swelling, increase motion, and prevent muscle weakness.   Please continue to use the TED compression stockings for 6 weeks. You may remove the stockings at night, but should reapply them in the morning. Do not drive or operate any equipment until instructed.  WOUND CARE:  Continue to use the PolarCare or ice packs periodically to reduce pain and swelling. You may bathe or shower after the staples are removed at the first office visit following surgery.  MEDICATIONS: You may resume your regular medications. Please take the pain medication as prescribed on the medication. Do not take pain medication on an empty stomach. You have been given a prescription for a blood thinner (Lovenox or Coumadin). Please take the medication as instructed. (NOTE: After completing a 2 week course of Lovenox, take one Enteric-coated aspirin once a day. This along with elevation will help reduce the possibility of phlebitis in your operated leg.) Do not drive or drink alcoholic beverages when taking pain medications.  CALL THE OFFICE FOR: Temperature above 101 degrees Excessive bleeding or drainage on the dressing. Excessive swelling, coldness, or paleness of the toes. Persistent nausea and vomiting.  FOLLOW-UP:  You  should have an appointment to return to the office in 10-14 days after surgery. Arrangements have been made for continuation of Physical Therapy (either home therapy or outpatient therapy).   [facility] Department Directory         [email or web address]       [email or web address]          Cardiology  Appointments: [place] - [phone] [place] - [phone]  Endocrinology  Appointments: [place] - [phone] [place] - [phone]  Gastroenterology  Appointments: [place] - [phone] [place] - [phone]        General Surgery   Appointments: [place] - [phone]  Internal Medicine/Family Medicine  Appointments: [place] - [phone] [place] - [phone] [place] - [phone]  Metabolic and Weigh Loss Surgery  Appointments: [place] - [phone]        Neurology  Appointments: [place] - [phone] [place] - [phone]  Neurosurgery  Appointments: [place] - [phone]  Obstetrics & Gynecology  Appointments: [place] - [phone] [place] - [phone]        Pediatrics  Appointments: [place] - [phone] [place] - [phone]  Physiatry  Appointments: [place] -[phone]  Physical Therapy  Appointments: [place] - [phone] [place] - [phone]        Podiatry  Appointments: [place] - [phone] [place] - [phone]  Pulmonology  Appointments: [place] - [phone]  Rheumatology  Appointments: [place] - [phone]        [place] Location: [facility]  [address] [place], [place]  [address]  [place] Location: [facility] [address] [place], [place]  [address]  [place] Location: [facility] [address] [place],   [address]    

## 2021-10-14 ENCOUNTER — Telehealth: Payer: Self-pay

## 2021-10-14 ENCOUNTER — Encounter: Payer: Self-pay | Admitting: Internal Medicine

## 2021-10-14 ENCOUNTER — Encounter
Admission: RE | Admit: 2021-10-14 | Discharge: 2021-10-14 | Disposition: A | Discharge status: Home / Self Care | Payer: Medicare Other | Source: Ambulatory Visit | Attending: Orthopedic Surgery | Admitting: Orthopedic Surgery

## 2021-10-14 ENCOUNTER — Other Ambulatory Visit: Payer: Self-pay

## 2021-10-14 VITALS — BP 133/66 | HR 72 | Resp 14 | Ht 63.0 in | Wt 121.3 lb

## 2021-10-14 DIAGNOSIS — M13861 Other specified arthritis, right knee: Secondary | ICD-10-CM | POA: Insufficient documentation

## 2021-10-14 DIAGNOSIS — Z01818 Encounter for other preprocedural examination: Secondary | ICD-10-CM

## 2021-10-14 DIAGNOSIS — R718 Other abnormality of red blood cells: Secondary | ICD-10-CM

## 2021-10-14 DIAGNOSIS — Z01812 Encounter for preprocedural laboratory examination: Secondary | ICD-10-CM | POA: Diagnosis not present

## 2021-10-14 DIAGNOSIS — R319 Hematuria, unspecified: Secondary | ICD-10-CM

## 2021-10-14 DIAGNOSIS — R8290 Unspecified abnormal findings in urine: Secondary | ICD-10-CM

## 2021-10-14 DIAGNOSIS — M1711 Unilateral primary osteoarthritis, right knee: Secondary | ICD-10-CM

## 2021-10-14 HISTORY — DX: Other abnormality of red blood cells: R71.8

## 2021-10-14 HISTORY — DX: Iodine-deficiency related diffuse (endemic) goiter: E01.0

## 2021-10-14 HISTORY — DX: Other amnesia: R41.3

## 2021-10-14 HISTORY — DX: Benign neoplasm of left adrenal gland: D35.02

## 2021-10-14 HISTORY — DX: Cyst of kidney, acquired: N28.1

## 2021-10-14 LAB — COMPREHENSIVE METABOLIC PANEL
ALT: 28 U/L (ref 0–44)
AST: 25 U/L (ref 15–41)
Albumin: 3.6 g/dL (ref 3.5–5.0)
Alkaline Phosphatase: 90 U/L (ref 38–126)
Anion gap: 10 (ref 5–15)
BUN: 15 mg/dL (ref 8–23)
CO2: 27 mmol/L (ref 22–32)
Calcium: 9.3 mg/dL (ref 8.9–10.3)
Chloride: 102 mmol/L (ref 98–111)
Creatinine, Ser: 0.55 mg/dL (ref 0.44–1.00)
GFR, Estimated: >60 mL/min (ref >60)
Glucose, Bld: 87 mg/dL (ref 70–99)
Potassium: 3.6 mmol/L (ref 3.5–5.1)
Sodium: 139 mmol/L (ref 135–145)
Total Bilirubin: 0.6 mg/dL (ref 0.3–1.2)
Total Protein: 6.9 g/dL (ref 6.5–8.1)

## 2021-10-14 LAB — URINALYSIS, ROUTINE W REFLEX MICROSCOPIC
Bilirubin Urine: NEGATIVE
Glucose, UA: NEGATIVE mg/dL
Ketones, ur: NEGATIVE mg/dL
Nitrite: NEGATIVE
Protein, ur: NEGATIVE mg/dL
Specific Gravity, Urine: 1.017 (ref 1.005–1.030)
Squamous Epithelial / HPF: NONE SEEN (ref 0–5)
WBC, UA: >50 WBC/hpf — ABNORMAL HIGH (ref 0–5)
pH: 5 (ref 5.0–8.0)

## 2021-10-14 LAB — C-REACTIVE PROTEIN: CRP: <0.5 mg/dL (ref <1.0)

## 2021-10-14 LAB — CBC
HCT: 36.9 % (ref 36.0–46.0)
Hemoglobin: 11.3 g/dL — ABNORMAL LOW (ref 12.0–15.0)
MCH: 22.3 pg — ABNORMAL LOW (ref 26.0–34.0)
MCHC: 30.6 g/dL (ref 30.0–36.0)
MCV: 72.8 fL — ABNORMAL LOW (ref 80.0–100.0)
Platelets: 256 10*3/uL (ref 150–400)
RBC: 5.07 MIL/uL (ref 3.87–5.11)
RDW: 15.6 % — ABNORMAL HIGH (ref 11.5–15.5)
WBC: 7 10*3/uL (ref 4.0–10.5)
nRBC: 0 % (ref 0.0–0.2)

## 2021-10-14 LAB — TYPE AND SCREEN
ABO/RH(D): A POS
Antibody Screen: NEGATIVE

## 2021-10-14 LAB — SURGICAL PCR SCREEN
MRSA, PCR: NEGATIVE
Staphylococcus aureus: NEGATIVE

## 2021-10-14 LAB — SEDIMENTATION RATE: Sed Rate: 15 mm/hr (ref 0–30)

## 2021-10-14 NOTE — Telephone Encounter (Signed)
? ?  Pre-operative Risk Assessment  ?  ?Patient Name: Kelli Smith  ?DOB: May 26, 1941 ?MRN: [id number]  ? ?  ? ?Request for Surgical Clearance   ? ?Procedure:   Computer Assisted total Knee Arthroplasty ? ?Date of Surgery:  Clearance 10/28/21                              ?   ?Surgeon:  Dr. Skip Estimable, MD ?Surgeon's Group or Practice Name:  [facility]  ?Phone number:  [phone] ?Fax number:  [phone] ?  ?Type of Clearance Requested:   ?- Medical  ?- Pharmacy:  Hold Rivaroxaban (Xarelto)   ?  ?Type of Anesthesia:  General  ?  ?Additional requests/questions:     ? ?Signed, ?Damascus Feldpausch   ?10/14/2021, 4:45 PM  ? ?

## 2021-10-14 NOTE — Telephone Encounter (Signed)
Patient with diagnosis of A Fib on Xarelto for anticoagulation.   ? ?Procedure: Computer Assisted total Knee Arthroplasty ?Date of procedure: 10/28/21 ? ? ?CHA2DS2-VASc Score = 5  ?his indicates a 7.2% annual risk of stroke. ?The patient's score is based upon: ?CHF History: 0 ?HTN History: 1 ?Diabetes History: 0 ?Stroke History: 0 ?Vascular Disease History: 1 ?Age Score: 2 ?Gender Score: 1 ?  ?CrCl 75 mL/min ?Platelet count 256K ? ? ?Per office protocol, patient can hold Xarelto for 3 days prior to procedure.   ?

## 2021-10-14 NOTE — Patient Instructions (Addendum)
Your procedure is scheduled on:10-28-21 Monday ?Report to the Registration Desk on the 1st floor of the [facility].Then proceed to the 2nd floor Surgery Desk in the [facility] ?To find out your arrival time, please call [phone] between 1PM - 3PM on:10-25-21 Friday ? ?REMEMBER: ?Instructions that are not followed completely may result in serious medical risk, up to and including death; or upon the discretion of your surgeon and anesthesiologist your surgery may need to be rescheduled. ? ?Do not eat food after midnight the night before surgery.  ?No gum chewing, lozengers or hard candies. ? ?You may however, drink CLEAR liquids up to 2 hours before you are scheduled to arrive for your surgery. Do not drink anything within 2 hours of your scheduled arrival time. ? ?Clear liquids include: ?- water  ?- apple juice without pulp ?- gatorade (not RED colors) ?- black coffee or tea (Do NOT add milk or creamers to the coffee or tea) ?Do NOT drink anything that is not on this list ? ?In addition, your doctor has ordered for you to drink the provided  ?Ensure Pre-Surgery Clear Carbohydrate Drink  ?Drinking this carbohydrate drink up to two hours before surgery helps to reduce insulin resistance and improve patient outcomes. Please complete drinking 2 hours prior to scheduled arrival time. ? ?TAKE THESE MEDICATIONS THE MORNING OF SURGERY WITH A SIP OF WATER: ?-amiodarone (PACERONE)  ? ?Last dose of rivaroxaban Alveda Reasons) will be on 10-24-21  (Thursday) as instructed by Dr Marry Guan ? ?One week prior to surgery:Last dose on 10-20-21 Sunday ?Stop Anti-inflammatories (NSAIDS) such as Advil, Aleve, Ibuprofen, Motrin, Naproxen, Naprosyn and Aspirin based products such as Excedrin, Goodys Powder, BC Powder.You may however, continue to take Tylenol/Tramadol if needed for pain up until the day of surgery. ? ?Stop ANY OVER THE COUNTER supplements/vitamins 7 days prior to surgery (B Complex-C-Folic Acid , Calcium Carbonate-Vit D,  VITAMIN D3, Ferrous Sulfate (IRON) , Multiple Vitamins, FISH OIL, vitamin B-12 ) ? ?No Alcohol for 24 hours before or after surgery. ? ?No Smoking including e-cigarettes for 24 hours prior to surgery.  ?No chewable tobacco products for at least 6 hours prior to surgery.  ?No nicotine patches on the day of surgery. ? ?Do not use any "recreational" drugs for at least a week prior to your surgery.  ?Please be advised that the combination of cocaine and anesthesia may have negative outcomes, up to and including death. ?If you test positive for cocaine, your surgery will be cancelled. ? ?On the morning of surgery brush your teeth with toothpaste and water, you may rinse your mouth with mouthwash if you wish. ?Do not swallow any toothpaste or mouthwash. ? ?Use CHG Soap as directed on instruction sheet. ? ?Do not wear jewelry, make-up, hairpins, clips or nail polish. ? ?Do not wear lotions, powders, or perfumes.  ? ?Do not shave body from the neck down 48 hours prior to surgery just in case you cut yourself which could leave a site for infection.  ?Also, freshly shaved skin may become irritated if using the CHG soap. ? ?Contact lenses, hearing aids and dentures may not be worn into surgery. ? ?Do not bring valuables to the hospital. [facility] is not responsible for any missing/lost belongings or valuables.  ? ?Notify your doctor if there is any change in your medical condition (cold, fever, infection). ? ?Wear comfortable clothing (specific to your surgery type) to the hospital. ? ?After surgery, you can help prevent lung complications by doing breathing  exercises.  ?Take deep breaths and cough every 1-2 hours. Your doctor may order a device called an Incentive Spirometer to help you take deep breaths. ?When coughing or sneezing, hold a pillow firmly against your incision with both hands. This is called ?splinting.? Doing this helps protect your incision. It also decreases belly discomfort. ? ?If you are being admitted  to the hospital overnight, leave your suitcase in the car. ?After surgery it may be brought to your room. ? ?If you are being discharged the day of surgery, you will not be allowed to drive home. ?You will need a responsible adult (18 years or older) to drive you home and stay with you that night.  ? ?If you are taking public transportation, you will need to have a responsible adult (18 years or older) with you. ?Please confirm with your physician that it is acceptable to use public transportation.  ? ?Please call the [facility] Dept. at [phone] if you have any questions about these instructions. ? ?Surgery Visitation Policy: ? ?Patients undergoing a surgery or procedure may have one family member or support person with them as long as that person is not COVID-19 positive or experiencing its symptoms.  ?That person may remain in the waiting area during the procedure and may rotate out with other people. ? ?Inpatient Visitation:   ? ?Visiting hours are 7 a.m. to 8 p.m. ?Up to two visitors ages 16+ are allowed at one time in a patient room. The visitors may rotate out with other people during the day. Visitors must check out when they leave, or other visitors will not be allowed. One designated support person may remain overnight. ?The visitor must pass COVID-19 screenings, use hand sanitizer when entering and exiting the patient?s room and wear a mask at all times, including in the patient?s room. ?Patients must also wear a mask when staff or their visitor are in the room. ?Masking is required regardless of vaccination status.  ?

## 2021-10-15 ENCOUNTER — Telehealth: Payer: Self-pay

## 2021-10-15 NOTE — Telephone Encounter (Signed)
? ? ?  Name: Kelli Smith  ?DOB: July 14, 1941  ?MRN: [id number] ? ?Primary Cardiologist: Kate Sable, MD ? ? ?Preoperative team, please contact this patient and set up a phone call appointment for further preoperative risk assessment. Please obtain consent and complete medication review. Thank you for your help. ? ? ?Ledora Bottcher, PA-C ?10/15/2021, 6:36 AM ?[phone] ?[facility] ?[address] ?[place], [place] [address] ? ? ?

## 2021-10-15 NOTE — Telephone Encounter (Signed)
?  Patient Consent for Virtual Visit  ? ? ?   ? ?Kelli Smith has provided verbal consent on 10/15/2021 for a virtual visit (video or telephone). ? ? ?CONSENT FOR VIRTUAL VISIT FOR:  Kelli Smith  ?By participating in this virtual visit I agree to the following: ? ?I hereby voluntarily request, consent and authorize [facility] and its employed or contracted physicians, physician assistants, nurse practitioners or other licensed health care professionals (the Practitioner), to provide me with telemedicine health care services (the ?Services") as deemed necessary by the treating Practitioner. I acknowledge and consent to receive the Services by the Practitioner via telemedicine. I understand that the telemedicine visit will involve communicating with the Practitioner through live audiovisual communication technology and the disclosure of certain medical information by electronic transmission. I acknowledge that I have been given the opportunity to request an in-person assessment or other available alternative prior to the telemedicine visit and am voluntarily participating in the telemedicine visit. ? ?I understand that I have the right to withhold or withdraw my consent to the use of telemedicine in the course of my care at any time, without affecting my right to future care or treatment, and that the Practitioner or I may terminate the telemedicine visit at any time. I understand that I have the right to inspect all information obtained and/or recorded in the course of the telemedicine visit and may receive copies of available information for a reasonable fee.  I understand that some of the potential risks of receiving the Services via telemedicine include:  ?Delay or interruption in medical evaluation due to technological equipment failure or disruption; ?Information transmitted may not be sufficient (e.g. poor resolution of images) to allow for appropriate medical decision making by the Practitioner; and/or   ?In rare instances, security protocols could fail, causing a breach of personal health information. ? ?Furthermore, I acknowledge that it is my responsibility to provide information about my medical history, conditions and care that is complete and accurate to the best of my ability. I acknowledge that Practitioner's advice, recommendations, and/or decision may be based on factors not within their control, such as incomplete or inaccurate data provided by me or distortions of diagnostic images or specimens that may result from electronic transmissions. I understand that the practice of medicine is not an exact science and that Practitioner makes no warranties or guarantees regarding treatment outcomes. I acknowledge that a copy of this consent can be made available to me via my patient portal ([facility]), or I can request a printed copy by calling the office of [facility].   ? ?I understand that my insurance will be billed for this visit.  ? ?I have read or had this consent read to me. ?I understand the contents of this consent, which adequately explains the benefits and risks of the Services being provided via telemedicine.  ?I have been provided ample opportunity to ask questions regarding this consent and the Services and have had my questions answered to my satisfaction. ?I give my informed consent for the services to be provided through the use of telemedicine in my medical care ? ? ? ?

## 2021-10-15 NOTE — Telephone Encounter (Signed)
Pt is scheduled for a Tele-Visit with our Pre-op team on 10/16/2021 for surgical clearance.  ?

## 2021-10-16 ENCOUNTER — Other Ambulatory Visit: Payer: Self-pay

## 2021-10-16 ENCOUNTER — Telehealth: Payer: Medicare Other

## 2021-10-16 NOTE — Progress Notes (Signed)
?  [facility] ?Perioperative Services: Pre-Admission/Anesthesia Testing ? ?Abnormal Lab Notification ?  ?Date: 10/16/21 ? ?Name: Kelli Smith ?MRN:   [id number] ? ?Re: Abnormal labs noted during PAT appointment  ? ?Notified:  ?Provider Name Provider Role Notification Mode  ?Skip Estimable, MD Orthopedics (Surgeon) Routed and/or faxed via [facility]  ?Tamala Julian, PA-C Orthopedics (APP) Routed and/or fax via [facility]  ? ?Abnormal Lab Value(s):  ? ?Lab Results  ?Component Value Date  ? COLORURINE AMBER (A) 10/14/2021  ? APPEARANCEUR CLOUDY (A) 10/14/2021  ? LABSPEC 1.017 10/14/2021  ? PHURINE 5.0 10/14/2021  ? GLUCOSEU NEGATIVE 10/14/2021  ? HGBUR MODERATE (A) 10/14/2021  ? [place] NEGATIVE 10/14/2021  ? [place] NEGATIVE 10/14/2021  ? PROTEINUR NEGATIVE 10/14/2021  ? NITRITE NEGATIVE 10/14/2021  ? LEUKOCYTESUR LARGE (A) 10/14/2021  ? EPIU NONE SEEN 10/14/2021  ? WBCU >50 (H) 10/14/2021  ? RBCU 21-50 10/14/2021  ? BACTERIA MANY (A) 10/14/2021  ? CULT (A) 10/14/2021  ?  >=100,000 COLONIES/mL ESCHERICHIA COLI ?SUSCEPTIBILITIES TO FOLLOW ?Performed at [facility], [place] [address], [place], [place] [address] ?  ? ?Clinical Information and Notes:  ?Patient is scheduled for COMPUTER ASSISTED TOTAL KNEE ARTHROPLASTY (Right: Knee) on 10/28/2021.   ? ?UA performed in PAT consistent with infection.  ?No leukocytosis noted on CBC; WBC 7000 K/uL ?Renal function: Estimated Creatinine Clearance: 45.6 mL/min (by C-G formula based on SCr of 0.55 mg/dL). ?Urine C&S added to assess for pathogenically significant growth. ?  ?Impression and Plan:  ?Kelli Smith with a UA that was (+) for infection. Subsequent culture sent that grew out significant Escherichia coli colony count. Patient with surgery scheduled soon. In efforts to avoid delaying patient's procedure, or have her experience any potentially significant perioperative complications related to the aforementioned, I am forwarding abnormal results to  patient's primary attending surgeon, and his APP, for review and consideration of treatment prior to her undergoing surgery.  ? ?Honor Loh, MSN, APRN, FNP-C, CEN ?[place]  ?Peri-operative Services Nurse Practitioner ?Phone: [phone] ?Fax: [phone] ?10/16/21 11:16 AM ? ?NOTE: This note has been prepared using [employer]. Despite my best ability to proofread, there is always the potential that unintentional transcriptional errors may still occur from this process. ?

## 2021-10-17 ENCOUNTER — Other Ambulatory Visit: Payer: Self-pay

## 2021-10-17 ENCOUNTER — Ambulatory Visit (INDEPENDENT_AMBULATORY_CARE_PROVIDER_SITE_OTHER): Payer: Medicare Other | Admitting: General Practice

## 2021-10-17 DIAGNOSIS — Z01810 Encounter for preprocedural cardiovascular examination: Secondary | ICD-10-CM | POA: Diagnosis not present

## 2021-10-17 DIAGNOSIS — M1711 Unilateral primary osteoarthritis, right knee: Secondary | ICD-10-CM | POA: Diagnosis not present

## 2021-10-17 DIAGNOSIS — I700 Atherosclerosis of aorta: Secondary | ICD-10-CM | POA: Diagnosis not present

## 2021-10-17 LAB — URINE CULTURE: Culture: >=100000 — AB

## 2021-10-17 NOTE — Progress Notes (Signed)
? ?Virtual Visit via Telephone Note  ? ?This visit type was conducted due to national recommendations for restrictions regarding the COVID-19 Pandemic (e.g. social distancing) in an effort to limit this patient's exposure and mitigate transmission in our community.  Due to her co-morbid illnesses, this patient is at least at moderate risk for complications without adequate follow up.  This format is felt to be most appropriate for this patient at this time.  The patient did not have access to video technology/had technical difficulties with video requiring transitioning to audio format only (telephone).  All issues noted in this document were discussed and addressed.  No physical exam could be performed with this format.  Please refer to the patient's chart for her  consent to telehealth for [facility]. ?Evaluation Performed:  Preoperative cardiovascular risk assessment ? ?This visit type was conducted due to national recommendations for restrictions regarding the COVID-19 Pandemic (e.g. social distancing).  This format is felt to be most appropriate for this patient at this time.  All issues noted in this document were discussed and addressed.  No physical exam was performed (except for noted visual exam findings with Video Visits).  Please refer to the patient's chart (MyChart message for video visits and phone note for telephone visits) for the patient's consent to telehealth for [facility] ?_____________  ? ?Date:  10/17/2021  ? ?Patient ID:  Kelli Smith, DOB 06/07/1941, MRN [id number] ?Patient Location:  ?Home ?Provider location:   ?Office ? ?Primary Care Provider:  McLean-Scocuzza, Nino Glow, MD ?Primary Cardiologist:  Kate Sable, MD ? ?Chief Complaint  ?  ?81 y.o. y/o female with a h/o atrial fibrillation, aortic atherosclerosis, coronary artery disease, hypertension, who is pending computer-assisted total knee arthroplasty, and presents today for telephonic preoperative cardiovascular risk  assessment. ? ?Past Medical History  ?  ?Past Medical History:  ?Diagnosis Date  ? A-fib ([place])   ? Adrenal adenoma, left   ? Anemia   ? Aortic atherosclerosis ([place])   ? Arthritis   ? knees, right shoulder   ? CAD (coronary artery disease)   ? Cyst of right kidney   ? DDD (degenerative disc disease), lumbar   ? History of chicken pox   ? History of kidney stones   ? H/O  ? Memory loss   ? Microcytosis   ? PAC (premature atrial contraction)   ? Thyromegaly   ? UTI (urinary tract infection)   ? ?Past Surgical History:  ?Procedure Laterality Date  ? CESAREAN SECTION    ? 1978  ? EYE SURGERY    ? cataract 2013/2014 b/l   ? FOOT SURGERY    ? bunion an dhammer toe in 2009  ? KNEE ARTHROPLASTY Left 12/12/2020  ? Procedure: COMPUTER ASSISTED TOTAL KNEE ARTHROPLASTY;  Surgeon: Dereck Leep, MD;  Location: [facility] ORS;  Service: Orthopedics;  Laterality: Left;  ? TEE WITHOUT CARDIOVERSION N/A 11/07/2020  ? Procedure: TRANSESOPHAGEAL ECHOCARDIOGRAM (TEE);  Surgeon: Kate Sable, MD;  Location: [facility] ORS;  Service: Cardiovascular;  Laterality: N/A;  ? ? ?Allergies ? ?No Known Allergies ? ?History of Present Illness  ?  ?Kelli Smith is a 81 y.o. female who presents via audio/video conferencing for a telehealth visit today.  Pt was last seen in cardiology clinic on 05/20/2021, by Agbor-Etang.  At that time [name] was doing well .  she is now pending computer-assisted total knee arthroplasty.  Since his last visit, she remained stable from a cardiac standpoint. ? ?Today she denies chest pain,  shortness of breath, lower extremity edema, fatigue, palpitations, melena, hematuria, hemoptysis, diaphoresis, weakness, presyncope, syncope, orthopnea, and PND. ? ? ? ?Home Medications  ?  ?Prior to Admission medications   ?Medication Sig Start Date End Date Taking? Authorizing Provider  ?acetaminophen (TYLENOL) 650 MG CR tablet Take 1,300 mg by mouth every 8 (eight) hours as needed for pain.    [provider]   ?amiodarone (PACERONE) 200 MG tablet Take 1 tablet (200 mg total) by mouth every morning. 09/16/21   Kate Sable, MD  ?B Complex-C-Folic Acid TABS Take 1 tablet by mouth daily.    [provider]  ?Calcium Carbonate-Vit D-Min (CALCIUM 600+D3 PLUS MINERALS PO) Take 1,200 mg by mouth daily.    [provider]  ?cholecalciferol (VITAMIN D3) 25 MCG (1000 UNIT) tablet Take 1,000 Units by mouth daily.    [provider]  ?donepezil (ARICEPT) 5 MG tablet Take 1 tablet (5 mg total) by mouth at bedtime. 08/30/21   McLean-Scocuzza, Nino Glow, MD  ?Ferrous Sulfate (IRON) 325 (65 Fe) MG TABS Take 325 mg by mouth daily.    [provider]  ?furosemide (LASIX) 20 MG tablet Take 1 tablet (20 mg total) by mouth daily as needed. For swelling and shortness of breath. ?Patient taking differently: Take 20 mg by mouth daily as needed (For swelling and shortness of breath.). 05/20/21   Kate Sable, MD  ?Multiple Vitamins-Minerals (MULTIVITAMIN WITH MINERALS) tablet Take 1 tablet by mouth daily.    [provider]  ?Omega-3 Fatty Acids (FISH OIL) 1200 MG CPDR Take 1,200 mg by mouth daily.    [provider]  ?rivaroxaban (XARELTO) 20 MG TABS tablet Take 1 tablet (20 mg total) by mouth daily with supper. 06/18/21   Kate Sable, MD  ?traMADol (ULTRAM) 50 MG tablet Take 1 tablet (50 mg total) by mouth at bedtime as needed for moderate pain. ?Patient taking differently: Take 50 mg by mouth at bedtime as needed for moderate pain or severe pain. 08/30/21   McLean-Scocuzza, Nino Glow, MD  ?vitamin B-12 (CYANOCOBALAMIN) 100 MCG tablet Take 100 mcg by mouth daily.    [provider]  ? ? ?Physical Exam  ?  ?Vital Signs:  Rilla Buckman does not have vital signs available for review today. ? ?Given telephonic nature of communication, physical exam is limited. ?AAOx3. NAD. Normal affect.  Speech and respirations are unlabored. ? ?Accessory Clinical Findings  ?   ?None ? ?Assessment & Plan  ?  ?1.  Preoperative Cardiovascular Risk Assessment: ? ?  ? ?Primary Cardiologist: Kate Sable, MD ? ?Chart reviewed as part of pre-operative protocol coverage. Given past medical history and time since last visit, based on ACC/AHA guidelines, Safiyyah Vasconez would be at acceptable risk for the planned procedure without further cardiovascular testing.  ? ?Patient was advised that if she develops new symptoms prior to surgery to contact our office to arrange a follow-up appointment.  He verbalized understanding. ? ?Her RCRI is a class I risk, 0.4% risk of major cardiac event.  She is able to complete greater than 4 METS of physical activity. ? ?Patient with diagnosis of A Fib on Xarelto for anticoagulation.   ?  ?Procedure: Computer Assisted total Knee Arthroplasty ?Date of procedure: 10/28/21 ?  ?  ?CHA2DS2-VASc Score = 5  ?his indicates a 7.2% annual risk of stroke. ?The patient's score is based upon: ?CHF History: 0 ?HTN History: 1 ?Diabetes History: 0 ?Stroke History: 0 ?Vascular Disease History: 1 ?Age Score: 2 ?Gender  Score: 1 ?  ?CrCl 75 mL/min ?Platelet count 256K ?  ?  ?Per office protocol, patient can hold Xarelto for 3 days prior to procedure. ? ? ? ?COVID-19 Education: ?The signs and symptoms of COVID-19 were discussed with the patient and how to seek care for testing (follow up with PCP or arrange E-visit).  The importance of social distancing was discussed today. ? ?Patient Risk:   ?After full review of this patient's history and clinical status, I feel that he is at least moderate risk for cardiac complications at this time, thus necessitating a telehealth visit sooner than our first available in office visit. ? ?Time:   ?Today, I have spent 5 minutes with the patient with telehealth technology discussing medical history, symptoms, and management plan.  I spent greater than 10 minutes reviewing patient's past medical history, medications, and cardiac history. ? ? ?Deberah Pelton, NP ? ?10/17/2021, 8:31 AM ? ?

## 2021-10-21 ENCOUNTER — Other Ambulatory Visit (INDEPENDENT_AMBULATORY_CARE_PROVIDER_SITE_OTHER): Payer: Medicare Other

## 2021-10-21 ENCOUNTER — Other Ambulatory Visit: Payer: Self-pay

## 2021-10-21 ENCOUNTER — Encounter: Payer: Self-pay | Admitting: Orthopedic Surgery

## 2021-10-21 DIAGNOSIS — E559 Vitamin D deficiency, unspecified: Secondary | ICD-10-CM

## 2021-10-21 DIAGNOSIS — I10 Essential (primary) hypertension: Secondary | ICD-10-CM

## 2021-10-21 DIAGNOSIS — R946 Abnormal results of thyroid function studies: Secondary | ICD-10-CM | POA: Diagnosis not present

## 2021-10-21 DIAGNOSIS — I4891 Unspecified atrial fibrillation: Secondary | ICD-10-CM | POA: Diagnosis not present

## 2021-10-21 DIAGNOSIS — E611 Iron deficiency: Secondary | ICD-10-CM

## 2021-10-21 DIAGNOSIS — E538 Deficiency of other specified B group vitamins: Secondary | ICD-10-CM | POA: Diagnosis not present

## 2021-10-21 LAB — LIPID PANEL
Cholesterol: 160 mg/dL (ref 0–200)
HDL: 74.1 mg/dL (ref >39.00)
LDL Cholesterol: 72 mg/dL (ref 0–99)
NonHDL: 85.42
Total CHOL/HDL Ratio: 2
Triglycerides: 69 mg/dL (ref 0.0–149.0)
VLDL: 13.8 mg/dL (ref 0.0–40.0)

## 2021-10-22 ENCOUNTER — Telehealth: Payer: Self-pay | Admitting: *Deleted

## 2021-10-22 ENCOUNTER — Encounter: Payer: Self-pay | Admitting: Orthopedic Surgery

## 2021-10-22 LAB — IBC + FERRITIN
Ferritin: 224 ng/mL (ref 10.0–291.0)
Iron: 79 ug/dL (ref 42–145)
Saturation Ratios: 29.2 % (ref 20.0–50.0)
TIBC: 270.2 ug/dL (ref 250.0–450.0)
Transferrin: 193 mg/dL — ABNORMAL LOW (ref 212.0–360.0)

## 2021-10-22 LAB — TSH: TSH: <0.01 u[IU]/mL — ABNORMAL LOW (ref 0.35–5.50)

## 2021-10-22 LAB — VITAMIN D 25 HYDROXY (VIT D DEFICIENCY, FRACTURES): VITD: 101.74 ng/mL (ref 30.00–100.00)

## 2021-10-22 LAB — T4, FREE: Free T4: 3.41 ng/dL — ABNORMAL HIGH (ref 0.60–1.60)

## 2021-10-22 LAB — VITAMIN B12: Vitamin B-12: >1504 pg/mL — ABNORMAL HIGH (ref 211–911)

## 2021-10-22 NOTE — Progress Notes (Addendum)
?Perioperative Services ? ?Pre-Admission/Anesthesia Testing Clinical Review ? ?Date: 10/22/21 ? ?Patient Demographics:  ?Name: Kelli Smith ?DOB:   1941-02-07 ?MRN:   [id number] ? ?Planned Surgical Procedure(s):  ? ? Case: [id number] Date/Time: 10/28/21 1120  ? Procedure: COMPUTER ASSISTED TOTAL KNEE ARTHROPLASTY (Right: Knee)  ? Anesthesia type: Choice  ? Pre-op diagnosis: PRIMARY OSTEOARTHRITIS OF RIGHT KNEE.  ? Location: [facility] OR ROOM 01 / [facility] ORS FOR ANESTHESIA GROUP  ? Surgeons: Kelli Leep, MD  ? ?NOTE: Available PAT nursing documentation and vital signs have been reviewed. Clinical nursing staff has updated patient's PMH/PSHx, current medication list, and drug allergies/intolerances to ensure comprehensive history available to assist in medical decision making as it pertains to the aforementioned surgical procedure and anticipated anesthetic course. Extensive review of available clinical information performed. Kelli Smith PMH and PSHx updated with any diagnoses/procedures that  may have been inadvertently omitted during her intake with the pre-admission testing department's nursing staff. ? ?Clinical Discussion:  ?Kelli Smith is a 81 y.o. female who is submitted for pre-surgical anesthesia review and clearance prior to her undergoing the above procedure. Patient has never been a smoker. Pertinent PMH includes: CAD, atrial fibrillation, aortic atherosclerosis, PACs, anemia, aortic atherosclerosis, OA, lumbar DDD, memory loss. ? ?Patient is followed by cardiology Kelli Lah, MD). She was last seen in the cardiology clinic on 05/20/2021; notes reviewed.  At the time of her clinic visit, patient doing well overall from a cardiovascular perspective.  She denied any episodes of chest pain, shortness of breath, PND, orthopnea, palpitations, significant peripheral edema, vertiginous symptoms, or presyncope/syncope.  Patient with past medical history significant for cardiovascular diagnoses. ? ?TTE performed on  09/06/2019 revealed a normal left ventricular systolic function with mild LVH; LVEF 60-65%.  There was mild biatrial enlargement.  Trivial tricuspid valve regurgitation noted.  There was no evidence of a significant transvalvular gradient to suggest stenosis. ? ?Long-term cardiac event monitor study performed on 10/11/2019 revealed a predominant underlying sinus rhythm at an average rate of 80 bpm; range was 59-245 bpm.  There were frequent episodes of atrial tachycardia and supraventricular tachycardia noted.  Cardiology noted that some of the episodes of SVT may have possibly represented atrial tachycardia. ? ?Transesophageal echocardiogram performed on 11/07/2020 revealed a low normal left ventricular systolic function with an EF of 50%.  Left atrium was moderately dilated.  There was mild to moderate mitral valve regurgitation, and trivial aortic valve regurgitation.  There was no evidence of atrial level shunting detected by color-flow Doppler. ? ?Patient with an atrial fibrillation diagnosis; CHA2DS2-VASc Score = 5 (age x 2, sex, HTN, aortic plaque).  Rate and rhythm maintained on oral amiodarone therapy.  Patient is chronically anticoagulated using rivaroxaban; compliant with therapy with no evidence or reports of GI bleeding.  Patient underwent DCCV procedure on 11/07/2020.  Patient required synchronized 200 J biphasic cardioversions before restoring NSR. Blood pressure reasonably controlled at 144/66 on currently prescribed diuretic monotherapy.  Patient is on an omega-3 fatty acid capsule daily for lipid-lowering therapy and ASCVD prevention.. Functional capacity, as defined by DASI, is documented as being >/= 4 METS.'s were made to her medication regimen.  No changes were made to her medication regimen.  Patient to follow-up with outpatient cardiology in 6 months or sooner if needed. ? ?Kelli Smith is scheduled for an elective RIGHT COMPUTER ASSISTED TOTAL KNEE ARTHROPLASTY on 10/28/2021 with Dr. Skip Estimable, MD.  Given patient's past medical history significant for cardiovascular diagnoses, presurgical cardiac clearance was sought by the  PAT team. Per cardiology, "based ACC/AHA guidelines, the patient's past medical history, and the amount of time since her last clinic visit, this patient would be at an overall ACCEPTABLE risk for the planned procedure without further cardiovascular testing or intervention at this time".  Again, this patient is on daily anticoagulation therapy.  She has been instructed on recommendations from her cardiologist for holding her daily rivaroxaban dose for 3 days prior to her procedure with plans to restart since postoperative bleeding risk felt to be minimized by her primary attending surgeon.  The patient is aware that her last dose of rivaroxaban will be on 10/24/2021. ? ?Patient denies previous perioperative complications with anesthesia in the past. In review of the available records, it is noted that patient underwent a neuraxial anesthetic course here (ASA IV) in 11/2020 without documented complications.  ? ? ?  10/14/2021  ? 12:51 PM 08/30/2021  ?  2:22 PM 06/04/2021  ?  2:13 PM  ?Vitals with BMI  ?Height '5\' 3"'$  '5\' 0"'$  '5\' 3"'$   ?Weight 121 lbs 4 oz 130 lbs 13 oz 131 lbs  ?BMI 21.48 25.54 23.21  ?Systolic 213 086   ?Diastolic 66 66   ?Pulse 72 82   ? ? ?Providers/Specialists:  ? ?NOTE: Primary physician provider listed below. Patient may have been seen by APP or partner within same practice.  ? ?PROVIDER ROLE / SPECIALTY LAST OV  ?Kelli Leep, MD Orthopedics (Surgeon) 10/17/2021  ?Kelli Smith, Kelli Glow, MD Primary Care Provider 08/30/2021  ?Kelli Sable, MD Cardiology 05/20/2021  ? ?Allergies:  ?Patient has no known allergies. ? ?Current Home Medications:  ? ?No current facility-administered medications for this encounter.  ? ? acetaminophen (TYLENOL) 650 MG CR tablet  ? amiodarone (PACERONE) 200 MG tablet  ? B Complex-C-Folic Acid TABS  ? Calcium Carbonate-Vit D-Min  (CALCIUM 600+D3 PLUS MINERALS PO)  ? cholecalciferol (VITAMIN D3) 25 MCG (1000 UNIT) tablet  ? donepezil (ARICEPT) 5 MG tablet  ? Ferrous Sulfate (IRON) 325 (65 Fe) MG TABS  ? furosemide (LASIX) 20 MG tablet  ? Multiple Vitamins-Minerals (MULTIVITAMIN WITH MINERALS) tablet  ? Omega-3 Fatty Acids (FISH OIL) 1200 MG CPDR  ? rivaroxaban (XARELTO) 20 MG TABS tablet  ? traMADol (ULTRAM) 50 MG tablet  ? vitamin B-12 (CYANOCOBALAMIN) 100 MCG tablet  ? ?History:  ? ?Past Medical History:  ?Diagnosis Date  ? A-fib ([place])   ? a.) CHA2DS2-VASc = 5 (age x 2, sex, HTN, aortic plaque). b.) s/p DCCV (200J x 2) 11/07/2020. c.) rate/rhythm maintained on oral amiodarone; chronically anticoagulated using rivaroxaban.  ? Adrenal adenoma, left   ? Anemia   ? Aortic atherosclerosis ([place])   ? Arthritis   ? knees, right shoulder   ? B12 deficiency   ? Basal cell carcinoma   ? CAD (coronary artery disease)   ? Cyst of right kidney   ? DDD (degenerative disc disease), lumbar   ? History of chicken pox   ? History of kidney stones   ? HTN (hypertension)   ? Long term current use of anticoagulant   ? a.) rivaroxaban  ? Memory loss   ? Microcytosis   ? Osteopenia   ? PAC (premature atrial contraction)   ? Thyromegaly   ? ?Past Surgical History:  ?Procedure Laterality Date  ? BUNIONECTOMY WITH HAMMERTOE RECONSTRUCTION  2009  ? CARDIOVERSION N/A 11/07/2020  ? Procedure: CARDIOVERSION (200J x 2); Location: [facility]; Surgeon: Kelli Sable, MD  ? CATARACT EXTRACTION Bilateral   ? 2013/2014  ?  [place]  ? KNEE ARTHROPLASTY Left 12/12/2020  ? Procedure: COMPUTER ASSISTED TOTAL KNEE ARTHROPLASTY;  Surgeon: Kelli Leep, MD;  Location: [facility] ORS;  Service: Orthopedics;  Laterality: Left;  ? TEE WITHOUT CARDIOVERSION N/A 11/07/2020  ? Procedure: TRANSESOPHAGEAL ECHOCARDIOGRAM (TEE);  Surgeon: Kelli Sable, MD;  Location: [facility] ORS;  Service: Cardiovascular;  Laterality: N/A;  ? ?Family History  ?Problem Relation Age of Onset  ?  Heart disease Mother   ?     died when pt was 73 y.o   ? Heart Problems Mother   ? Heart disease Father   ? Alzheimer's disease Sister   ? ?Social History  ? ?Tobacco Use  ? Smoking status: Never  ? [place]

## 2021-10-22 NOTE — Telephone Encounter (Signed)
CRITICAL VALUE STICKER ? ?CRITICAL VALUE: Vitamin D-101.74 ? ?RECEIVER (on-site recipient of call): Jari Favre, CMA ? ?DATE & TIME NOTIFIED: 3/28 '@11'$ :36am ? ?MESSENGER (representative from lab): [facility] ? ?MD NOTIFIED: Dr. Gayland Curry ? ?TIME OF NOTIFICATION: 11:43am ? ?RESPONSE:   ?

## 2021-10-22 NOTE — Telephone Encounter (Signed)
Vitamin D elevated stop all vitamin D for now  ? ?

## 2021-10-24 DIAGNOSIS — Z961 Presence of intraocular lens: Secondary | ICD-10-CM | POA: Diagnosis not present

## 2021-10-24 DIAGNOSIS — H04123 Dry eye syndrome of bilateral lacrimal glands: Secondary | ICD-10-CM | POA: Diagnosis not present

## 2021-10-24 DIAGNOSIS — H524 Presbyopia: Secondary | ICD-10-CM | POA: Diagnosis not present

## 2021-10-24 DIAGNOSIS — H35363 Drusen (degenerative) of macula, bilateral: Secondary | ICD-10-CM | POA: Diagnosis not present

## 2021-10-24 NOTE — Telephone Encounter (Signed)
Patient daughter informed and verbalized understanding.

## 2021-10-25 ENCOUNTER — Other Ambulatory Visit: Payer: Medicare Other

## 2021-10-27 MED ORDER — CHLORHEXIDINE GLUCONATE 4 % EX LIQD: 60.0000 mL | Freq: Once | CUTANEOUS | Status: DC

## 2021-10-27 MED ORDER — GABAPENTIN 300 MG PO CAPS: 300.0000 mg | ORAL_CAPSULE | Freq: Once | ORAL | Status: AC

## 2021-10-27 MED ORDER — TRANEXAMIC ACID-NACL 1000-0.7 MG/100ML-% IV SOLN
1000.0000 mg | INTRAVENOUS | Status: AC
  Administered 2021-10-28: 1000 mg via INTRAVENOUS

## 2021-10-27 MED ORDER — CEFAZOLIN SODIUM-DEXTROSE 2-4 GM/100ML-% IV SOLN
2.0000 g | INTRAVENOUS | Status: AC
  Administered 2021-10-28: 2 g via INTRAVENOUS

## 2021-10-27 MED ORDER — CHLORHEXIDINE GLUCONATE 0.12 % MT SOLN: 15.0000 mL | Freq: Once | OROMUCOSAL | Status: AC

## 2021-10-27 MED ORDER — FAMOTIDINE 20 MG PO TABS
20.0000 mg | ORAL_TABLET | Freq: Once | ORAL | Status: AC
Start: 2021-10-27 — End: 2021-10-28

## 2021-10-27 MED ORDER — CELECOXIB 200 MG PO CAPS: 400.0000 mg | ORAL_CAPSULE | Freq: Once | ORAL | Status: AC

## 2021-10-27 MED ORDER — ORAL CARE MOUTH RINSE: 15.0000 mL | Freq: Once | OROMUCOSAL | Status: AC

## 2021-10-27 MED ORDER — LACTATED RINGERS IV SOLN: INTRAVENOUS | Status: DC

## 2021-10-27 MED ORDER — DEXAMETHASONE SODIUM PHOSPHATE 10 MG/ML IJ SOLN: 8.0000 mg | Freq: Once | INTRAMUSCULAR | Status: AC

## 2021-10-27 NOTE — H&P (Signed)
ORTHOPAEDIC HISTORY & PHYSICAL ?Tamala Julian [place], [place] - 10/17/2021 10:45 AM EDT ?Formatting of this note is different from the original. ?[place] [facility] ?Chief Complaint:  ? ?Chief Complaint  ?Patient presents with  ? Knee Pain  ?H & P RIGHT KNEE  ? ?History of Present Illness:  ? ?Kelli Smith is a 81 y.o. female that presents to clinic today for her preoperative history and evaluation. Patient presents with her daughter. The patient is scheduled to undergo a right total knee arthroplasty on 10/28/21 by Dr. Marry Guan. Her pain began over 10 years ago. The pain is located primarily along the medial aspect of the knee. She describes her pain as worse with any weightbearing. She reports associated swelling, locking, and some giving way of the knee. She denies associated numbness or tingling.  ? ?The patient's symptoms have progressed to the point that they decrease her quality of life. The patient has previously undergone conservative treatment including NSAIDS and injections to the knee without adequate control of her symptoms. ? ?Patient denies history of lumbar surgery,denies history of DVT. She is followed by Dr Garen Lah for atrial fibrillation. She is anticoagulated on Xarelto. ? ?No known drug allergies. ? ?Patient lives with her daughter. A second daughter is also flying in to help post-operatively.  ? ?Past Medical, Surgical, Family, Social History, Allergies, Medications:  ? ?Past Medical History:  ?Past Medical History:  ?Diagnosis Date  ? Atrial fibrillation (CMS-HCC)  ? Chicken pox  ? DDD (degenerative disc disease), lumbar 04/05/2020  ? Knee pain, bilateral 06/11/2018  ? Lumbar radiculopathy 04/05/2020  ? Memory loss  ? Osteoarthritis  ? Osteopenia  ? ?Past Surgical History:  ?Past Surgical History:  ?Procedure Laterality Date  ? FOOT SURGERY 2009  ?BUNION AND HAMMER TOE  ? Left total knee arthroplasty using computer-assisted navigation 12/12/2020  ?Dr Marry Guan  ?  CESAREAN SECTION  ? LENS EYE SURGERY Bilateral 20136/2014  ?CATARACT SURGERY  ? ?Current Medications:  ?Current Outpatient Medications  ?Medication Sig Dispense Refill  ? acetaminophen (TYLENOL) 650 MG ER tablet Take 1,300 mg by mouth 2 (two) times daily as needed for Pain  ? AMIOdarone (PACERONE) 200 MG tablet Take 200 mg by mouth once daily  ? Ca-D3-mag ox-zinc-cop-mang-bor 600 mg calcium- 20 mcg-50 mg Tab 1 tablet 2 (two) times daily  ? cholecalciferol, vitamin D3, (CHOLECALCIFEROL, VIT D3,,BULK, MISC) Take 1 tablet by mouth once daily  ? cyanocobalamin (VITAMIN B12) 100 MCG tablet Take 1,000 mcg by mouth once daily  ? donepeziL (ARICEPT) 5 MG tablet Take 5 mg by mouth at bedtime  ? ferrous sulfate 325 (65 FE) MG tablet 325 mg daily with breakfast  ? FUROsemide (LASIX) 20 MG tablet TAKE 1 TABLET (20 MG TOTAL) BY MOUTH DAILY. IN AM  ? multivitamin with minerals tablet Take 1 tablet by mouth once daily  ? omega 3-dha-epa-fish oil 1,200 (144-216) mg Cap Take 1 capsule by mouth once daily  ? rivaroxaban (XARELTO) 20 mg tablet Take 20 mg by mouth daily with dinner  ? traMADoL (ULTRAM) 50 mg tablet Take 50 mg by mouth at bedtime as needed for Pain  ? vitamin B complex (B COMPLEX 1 ORAL) Take 1 tablet by mouth once daily  ? ?No current facility-administered medications for this visit.  ? ?Allergies: No Known Allergies ? ?Social History:  ?Social History  ? ?Socioeconomic History  ? Marital status: Widowed  ? Number of children: 2  ? Years of education: 74  ?  Highest education level: Bachelor's degree (e.g., BA, AB, BS)  ?Occupational History  ? Occupation: RetiredGames developer  ?Tobacco Use  ? Smoking status: Never  ? Smokeless tobacco: Never  ?Vaping Use  ? Vaping Use: Never used  ?Substance and Sexual Activity  ? Alcohol use: Not Currently  ? Drug use: Never  ? Sexual activity: Defer  ?Partners: Male  ? ?Family History:  ?Family History  ?Problem Relation Age of Onset  ? Heart disease Mother  ? Heart disease Father   ? ?Review of Systems:  ? ?A 10+ ROS was performed, reviewed, and the pertinent orthopaedic findings are documented in the HPI.  ? ?Physical Examination:  ? ?BP 138/80 (BP Location: Left upper arm, Patient Position: Sitting, BP Cuff Size: Adult)  Ht 160 cm ('5\' 3"'$ )  Wt 55.5 kg (122 lb 6.4 oz)  BMI 21.68 kg/m?  ? ?Patient is a well-developed, well-nourished female in no acute distress. Patient has normal mood and affect. Patient is alert and oriented to person, place, and time.  ? ?HEENT: Atraumatic, normocephalic. Pupils equal and reactive to light. Extraocular motion intact. Noninjected sclera. ? ?Cardiovascular: Regular rate and rhythm, with no murmurs, rubs, or gallops. Distal pulses palpable. No carotid bruits. ? ?Respiratory: Lungs clear to auscultation bilaterally.  ? ?  ?Right Knee: ?Soft tissue swelling: minimal ?Effusion: none ?Erythema: none ?Crepitance: mild ?Tenderness: medial ?Alignment: relative varus ?Mediolateral laxity: medial pseudolaxity ?Posterior sag: negative ?Patellar tracking: Good tracking without evidence of subluxation or tilt ?Atrophy: No significant atrophy.  ?Quadriceps tone was fair to good. ?Range of motion: 0/5/110 degrees  ? ?Sensation intact over the saphenous, lateral sural cutaneous, superficial fibular, and deep fibular nerve distributions. ? ?Tests Performed/Reviewed:  ?X-rays ? ?3 views of the right knee were obtained. Images reveal severe loss of medial compartment joint space with significant osteophyte formation. Mild subchondral sclerosis noted. No fractures or dislocations. No osseous abnormality noted. ? ?I personally ordered and interpreted today's x-rays. ? ?Impression:  ? ?ICD-10-CM  ?1. Primary osteoarthritis of right knee M17.11  ?2. Aortic atherosclerosis (CMS-HCC) I70.0  ? ?Plan:  ? ?The patient has end-stage degenerative changes of the right knee. It was explained to the patient that the condition is progressive in nature. Having failed conservative treatment,  the patient has elected to proceed with a total joint arthroplasty. The patient will undergo a total joint arthroplasty with Dr. Marry Guan. The risks of surgery, including blood clot and infection, were discussed with the patient. Measures to reduce these risks, including the use of anticoagulation, perioperative antibiotics, and early ambulation were discussed. The importance of postoperative physical therapy was discussed with the patient. The patient elects to proceed with surgery. The patient is instructed to stop all blood thinners prior to surgery. The patient is instructed to call the hospital the day before surgery to learn of the proper arrival time.  ? ?Contact our office with any questions or concerns. Follow up as indicated, or sooner should any new problems arise, if conditions worsen, or if they are otherwise concerned.  ? ?Gwenlyn Fudge, PA -C ?[facility] ?[address] ?[place],  [address] ?Phone: [phone] ? ?This note was generated in part with voice recognition software and I apologize for any typographical errors that were not detected and corrected. ? ?Electronically signed by Gwenlyn Fudge, PA at 10/17/2021 1:38 PM EDT ?

## 2021-10-28 ENCOUNTER — Telehealth: Payer: Self-pay

## 2021-10-28 ENCOUNTER — Observation Stay
Admission: RE | Admit: 2021-10-28 | Discharge: 2021-10-30 | Disposition: A | Discharge status: Home Health | Payer: Medicare Other | Attending: Orthopedic Surgery | Admitting: Orthopedic Surgery

## 2021-10-28 ENCOUNTER — Ambulatory Visit: Payer: Medicare Other | Admitting: Urgent Care

## 2021-10-28 ENCOUNTER — Encounter
Admission: RE | Disposition: A | Discharge status: Home Health | Payer: Self-pay | Source: Home / Self Care | Attending: Orthopedic Surgery

## 2021-10-28 ENCOUNTER — Encounter: Payer: Self-pay | Admitting: Orthopedic Surgery

## 2021-10-28 ENCOUNTER — Other Ambulatory Visit: Payer: Self-pay

## 2021-10-28 ENCOUNTER — Observation Stay: Payer: Medicare Other

## 2021-10-28 ENCOUNTER — Encounter: Payer: Self-pay | Admitting: Cardiology

## 2021-10-28 DIAGNOSIS — M1711 Unilateral primary osteoarthritis, right knee: Principal | ICD-10-CM

## 2021-10-28 DIAGNOSIS — Z96659 Presence of unspecified artificial knee joint: Secondary | ICD-10-CM

## 2021-10-28 DIAGNOSIS — Z96651 Presence of right artificial knee joint: Secondary | ICD-10-CM | POA: Diagnosis not present

## 2021-10-28 DIAGNOSIS — I4891 Unspecified atrial fibrillation: Secondary | ICD-10-CM

## 2021-10-28 DIAGNOSIS — Z96652 Presence of left artificial knee joint: Secondary | ICD-10-CM | POA: Insufficient documentation

## 2021-10-28 DIAGNOSIS — M5416 Radiculopathy, lumbar region: Secondary | ICD-10-CM | POA: Diagnosis not present

## 2021-10-28 DIAGNOSIS — R718 Other abnormality of red blood cells: Secondary | ICD-10-CM

## 2021-10-28 DIAGNOSIS — R319 Hematuria, unspecified: Secondary | ICD-10-CM

## 2021-10-28 HISTORY — DX: Basal cell carcinoma of skin, unspecified: C44.91

## 2021-10-28 HISTORY — DX: Deficiency of other specified B group vitamins: E53.8

## 2021-10-28 HISTORY — DX: Other specified disorders of bone density and structure, unspecified site: M85.80

## 2021-10-28 HISTORY — DX: Essential (primary) hypertension: I10

## 2021-10-28 HISTORY — DX: Long term (current) use of anticoagulants: Z79.01

## 2021-10-28 HISTORY — PX: KNEE ARTHROPLASTY: SHX992

## 2021-10-28 SURGERY — ARTHROPLASTY, KNEE, TOTAL, USING IMAGELESS COMPUTER-ASSISTED NAVIGATION
Anesthesia: Spinal | Site: Knee | Laterality: Right

## 2021-10-28 MED ORDER — TRANEXAMIC ACID-NACL 1000-0.7 MG/100ML-% IV SOLN: 1000.0000 mg | Freq: Once | INTRAVENOUS | Status: AC

## 2021-10-28 MED ORDER — TRANEXAMIC ACID-NACL 1000-0.7 MG/100ML-% IV SOLN
INTRAVENOUS | Status: AC
  Filled 2021-10-28: qty 100

## 2021-10-28 MED ORDER — VITAMIN B-12 100 MCG PO TABS
100.0000 ug | ORAL_TABLET | Freq: Every day | ORAL | Status: DC
  Administered 2021-10-29 – 2021-10-30 (×2): 100 ug via ORAL
  Filled 2021-10-28 (×2): qty 1

## 2021-10-28 MED ORDER — PANTOPRAZOLE SODIUM 40 MG PO TBEC
40.0000 mg | DELAYED_RELEASE_TABLET | Freq: Two times a day (BID) | ORAL | Status: DC
  Administered 2021-10-28 – 2021-10-30 (×4): 40 mg via ORAL
  Filled 2021-10-28 (×4): qty 1

## 2021-10-28 MED ORDER — FLEET ENEMA 7-19 GM/118ML RE ENEM: 1.0000 | ENEMA | Freq: Once | RECTAL | Status: DC | PRN

## 2021-10-28 MED ORDER — ONDANSETRON HCL 4 MG PO TABS: 4.0000 mg | ORAL_TABLET | Freq: Four times a day (QID) | ORAL | Status: DC | PRN

## 2021-10-28 MED ORDER — CELECOXIB 200 MG PO CAPS
ORAL_CAPSULE | ORAL | Status: AC
  Administered 2021-10-28: 400 mg via ORAL
  Filled 2021-10-28: qty 2

## 2021-10-28 MED ORDER — SODIUM CHLORIDE 0.9 % IV SOLN: INTRAVENOUS | Status: DC

## 2021-10-28 MED ORDER — OXYCODONE HCL 5 MG PO TABS
ORAL_TABLET | ORAL | Status: AC
Start: 2021-10-28 — End: 2021-10-29
  Filled 2021-10-28: qty 1

## 2021-10-28 MED ORDER — AMIODARONE HCL 200 MG PO TABS
200.0000 mg | ORAL_TABLET | Freq: Every day | ORAL | Status: DC
  Administered 2021-10-29 – 2021-10-30 (×2): 200 mg via ORAL
  Filled 2021-10-28 (×2): qty 1

## 2021-10-28 MED ORDER — DIPHENHYDRAMINE HCL 12.5 MG/5ML PO ELIX: 12.5000 mg | ORAL_SOLUTION | ORAL | Status: DC | PRN

## 2021-10-28 MED ORDER — FERROUS SULFATE 325 (65 FE) MG PO TABS
325.0000 mg | ORAL_TABLET | Freq: Two times a day (BID) | ORAL | Status: DC
  Administered 2021-10-29 – 2021-10-30 (×3): 325 mg via ORAL
  Filled 2021-10-28 (×3): qty 1

## 2021-10-28 MED ORDER — FISH OIL 1200 MG PO CPDR: 1200.0000 mg | DELAYED_RELEASE_CAPSULE | Freq: Every day | ORAL | Status: DC

## 2021-10-28 MED ORDER — ALUM & MAG HYDROXIDE-SIMETH 200-200-20 MG/5ML PO SUSP: 30.0000 mL | ORAL | Status: DC | PRN

## 2021-10-28 MED ORDER — MENTHOL 3 MG MT LOZG
1.0000 | LOZENGE | OROMUCOSAL | Status: DC | PRN
  Filled 2021-10-28: qty 9

## 2021-10-28 MED ORDER — HYDROMORPHONE HCL 1 MG/ML IJ SOLN: 0.5000 mg | INTRAMUSCULAR | Status: DC | PRN

## 2021-10-28 MED ORDER — PHENYLEPHRINE HCL-NACL 20-0.9 MG/250ML-% IV SOLN
INTRAVENOUS | Status: DC | PRN
  Administered 2021-10-28: 20 ug/min via INTRAVENOUS

## 2021-10-28 MED ORDER — NEOMYCIN-POLYMYXIN B GU 40-200000 IR SOLN
Status: DC | PRN
  Administered 2021-10-28: 14 mL

## 2021-10-28 MED ORDER — FAMOTIDINE 20 MG PO TABS
ORAL_TABLET | ORAL | Status: AC
  Administered 2021-10-28: 20 mg via ORAL
  Filled 2021-10-28: qty 1

## 2021-10-28 MED ORDER — GABAPENTIN 300 MG PO CAPS
ORAL_CAPSULE | ORAL | Status: AC
Start: 2021-10-28 — End: 2021-10-28
  Administered 2021-10-28: 300 mg via ORAL
  Filled 2021-10-28: qty 1

## 2021-10-28 MED ORDER — ACETAMINOPHEN 10 MG/ML IV SOLN
1000.0000 mg | Freq: Four times a day (QID) | INTRAVENOUS | Status: AC
  Administered 2021-10-28 – 2021-10-29 (×4): 1000 mg via INTRAVENOUS
  Filled 2021-10-28 (×4): qty 100

## 2021-10-28 MED ORDER — 0.9 % SODIUM CHLORIDE (POUR BTL) OPTIME
TOPICAL | Status: DC | PRN
  Administered 2021-10-28: 500 mL

## 2021-10-28 MED ORDER — EPHEDRINE SULFATE (PRESSORS) 50 MG/ML IJ SOLN
INTRAMUSCULAR | Status: DC | PRN
  Administered 2021-10-28: 5 mg via INTRAVENOUS

## 2021-10-28 MED ORDER — ONDANSETRON HCL 4 MG/2ML IJ SOLN
INTRAMUSCULAR | Status: AC
  Filled 2021-10-28: qty 2

## 2021-10-28 MED ORDER — PHENOL 1.4 % MT LIQD
1.0000 | OROMUCOSAL | Status: DC | PRN
  Filled 2021-10-28: qty 177

## 2021-10-28 MED ORDER — PROPOFOL 10 MG/ML IV BOLUS
INTRAVENOUS | Status: DC | PRN
  Administered 2021-10-28: 10 mg via INTRAVENOUS

## 2021-10-28 MED ORDER — ACETAMINOPHEN 325 MG PO TABS: 325.0000 mg | ORAL_TABLET | Freq: Four times a day (QID) | ORAL | Status: DC | PRN

## 2021-10-28 MED ORDER — PROPOFOL 500 MG/50ML IV EMUL
INTRAVENOUS | Status: DC | PRN
  Administered 2021-10-28: 80 ug/kg/min via INTRAVENOUS

## 2021-10-28 MED ORDER — OYSTER SHELL CALCIUM/D3 500-5 MG-MCG PO TABS
1.0000 | ORAL_TABLET | Freq: Two times a day (BID) | ORAL | Status: DC
  Administered 2021-10-28 – 2021-10-30 (×4): 1 via ORAL
  Filled 2021-10-28 (×4): qty 1

## 2021-10-28 MED ORDER — VITAMIN D 25 MCG (1000 UNIT) PO TABS
1000.0000 [IU] | ORAL_TABLET | Freq: Every day | ORAL | Status: DC
  Administered 2021-10-29 – 2021-10-30 (×2): 1000 [IU] via ORAL
  Filled 2021-10-28 (×2): qty 1

## 2021-10-28 MED ORDER — OXYCODONE HCL 5 MG PO TABS
5.0000 mg | ORAL_TABLET | Freq: Once | ORAL | Status: AC | PRN
  Administered 2021-10-28: 5 mg via ORAL

## 2021-10-28 MED ORDER — OXYCODONE HCL 5 MG PO TABS
5.0000 mg | ORAL_TABLET | ORAL | Status: DC | PRN
  Administered 2021-10-29 – 2021-10-30 (×2): 5 mg via ORAL
  Filled 2021-10-28 (×2): qty 1

## 2021-10-28 MED ORDER — SENNOSIDES-DOCUSATE SODIUM 8.6-50 MG PO TABS
1.0000 | ORAL_TABLET | Freq: Two times a day (BID) | ORAL | Status: DC
  Administered 2021-10-28 – 2021-10-29 (×3): 1 via ORAL
  Filled 2021-10-28 (×4): qty 1

## 2021-10-28 MED ORDER — EPHEDRINE 5 MG/ML INJ
INTRAVENOUS | Status: AC
  Filled 2021-10-28: qty 5

## 2021-10-28 MED ORDER — BUPIVACAINE HCL (PF) 0.5 % IJ SOLN
INTRAMUSCULAR | Status: DC | PRN
  Administered 2021-10-28: 3 mL via INTRATHECAL

## 2021-10-28 MED ORDER — CELECOXIB 200 MG PO CAPS
200.0000 mg | ORAL_CAPSULE | Freq: Two times a day (BID) | ORAL | Status: DC
  Administered 2021-10-28 – 2021-10-30 (×4): 200 mg via ORAL
  Filled 2021-10-28 (×4): qty 1

## 2021-10-28 MED ORDER — ONDANSETRON HCL 4 MG/2ML IJ SOLN
INTRAMUSCULAR | Status: DC | PRN
  Administered 2021-10-28: 4 mg via INTRAVENOUS

## 2021-10-28 MED ORDER — SODIUM CHLORIDE (PF) 0.9 % IJ SOLN
INTRAMUSCULAR | Status: DC | PRN
  Administered 2021-10-28: 120 mL via INTRAMUSCULAR

## 2021-10-28 MED ORDER — FENTANYL CITRATE (PF) 100 MCG/2ML IJ SOLN
INTRAMUSCULAR | Status: DC | PRN
  Administered 2021-10-28 (×4): 25 ug via INTRAVENOUS

## 2021-10-28 MED ORDER — ONDANSETRON HCL 4 MG/2ML IJ SOLN: 4.0000 mg | Freq: Once | INTRAMUSCULAR | Status: DC | PRN

## 2021-10-28 MED ORDER — TRAMADOL HCL 50 MG PO TABS
50.0000 mg | ORAL_TABLET | ORAL | Status: DC | PRN
  Administered 2021-10-29: 100 mg via ORAL
  Filled 2021-10-28: qty 2

## 2021-10-28 MED ORDER — CHLORHEXIDINE GLUCONATE 0.12 % MT SOLN
OROMUCOSAL | Status: AC
  Administered 2021-10-28: 15 mL via OROMUCOSAL
  Filled 2021-10-28: qty 15

## 2021-10-28 MED ORDER — MAGNESIUM HYDROXIDE 400 MG/5ML PO SUSP
30.0000 mL | Freq: Every day | ORAL | Status: DC
  Administered 2021-10-28 – 2021-10-30 (×3): 30 mL via ORAL
  Filled 2021-10-28 (×3): qty 30

## 2021-10-28 MED ORDER — TRANEXAMIC ACID-NACL 1000-0.7 MG/100ML-% IV SOLN
INTRAVENOUS | Status: AC
  Administered 2021-10-28: 1000 mg via INTRAVENOUS
  Filled 2021-10-28: qty 100

## 2021-10-28 MED ORDER — METOCLOPRAMIDE HCL 10 MG PO TABS
10.0000 mg | ORAL_TABLET | Freq: Three times a day (TID) | ORAL | Status: DC
  Administered 2021-10-28 – 2021-10-30 (×6): 10 mg via ORAL
  Filled 2021-10-28 (×6): qty 1

## 2021-10-28 MED ORDER — CEFAZOLIN SODIUM-DEXTROSE 2-4 GM/100ML-% IV SOLN
INTRAVENOUS | Status: AC
  Filled 2021-10-28: qty 100

## 2021-10-28 MED ORDER — FENTANYL CITRATE (PF) 100 MCG/2ML IJ SOLN
INTRAMUSCULAR | Status: AC
Start: 2021-10-28 — End: 2021-10-29
  Filled 2021-10-28: qty 2

## 2021-10-28 MED ORDER — ONDANSETRON HCL 4 MG/2ML IJ SOLN: 4.0000 mg | Freq: Four times a day (QID) | INTRAMUSCULAR | Status: DC | PRN

## 2021-10-28 MED ORDER — FUROSEMIDE 20 MG PO TABS: 20.0000 mg | ORAL_TABLET | Freq: Every day | ORAL | Status: DC | PRN

## 2021-10-28 MED ORDER — PHENYLEPHRINE HCL (PRESSORS) 10 MG/ML IV SOLN
INTRAVENOUS | Status: AC
  Filled 2021-10-28: qty 1

## 2021-10-28 MED ORDER — DEXAMETHASONE SODIUM PHOSPHATE 10 MG/ML IJ SOLN
INTRAMUSCULAR | Status: AC
  Administered 2021-10-28: 8 mg via INTRAVENOUS
  Filled 2021-10-28: qty 1

## 2021-10-28 MED ORDER — CEFAZOLIN SODIUM-DEXTROSE 2-4 GM/100ML-% IV SOLN
2.0000 g | Freq: Four times a day (QID) | INTRAVENOUS | Status: AC
  Administered 2021-10-28 – 2021-10-29 (×2): 2 g via INTRAVENOUS
  Filled 2021-10-28 (×2): qty 100

## 2021-10-28 MED ORDER — DONEPEZIL HCL 5 MG PO TABS
5.0000 mg | ORAL_TABLET | Freq: Every day | ORAL | Status: DC
  Administered 2021-10-28 – 2021-10-29 (×2): 5 mg via ORAL
  Filled 2021-10-28 (×2): qty 1

## 2021-10-28 MED ORDER — BISACODYL 10 MG RE SUPP: 10.0000 mg | Freq: Every day | RECTAL | Status: DC | PRN

## 2021-10-28 MED ORDER — SODIUM CHLORIDE 0.9 % IR SOLN
Status: DC | PRN
  Administered 2021-10-28: 3000 mL

## 2021-10-28 MED ORDER — FENTANYL CITRATE (PF) 100 MCG/2ML IJ SOLN
25.0000 ug | INTRAMUSCULAR | Status: DC | PRN
  Administered 2021-10-28 (×2): 25 ug via INTRAVENOUS

## 2021-10-28 MED ORDER — FENTANYL CITRATE (PF) 100 MCG/2ML IJ SOLN
INTRAMUSCULAR | Status: AC
  Filled 2021-10-28: qty 2

## 2021-10-28 MED ORDER — OXYCODONE HCL 5 MG/5ML PO SOLN: 5.0000 mg | Freq: Once | ORAL | Status: AC | PRN

## 2021-10-28 MED ORDER — OXYCODONE HCL 5 MG PO TABS: 10.0000 mg | ORAL_TABLET | ORAL | Status: DC | PRN

## 2021-10-28 MED ORDER — ACETAMINOPHEN 10 MG/ML IV SOLN
INTRAVENOUS | Status: DC | PRN
  Administered 2021-10-28: 1000 mg via INTRAVENOUS

## 2021-10-28 MED ORDER — ACETAMINOPHEN 10 MG/ML IV SOLN
INTRAVENOUS | Status: AC
  Filled 2021-10-28: qty 100

## 2021-10-28 MED ORDER — RIVAROXABAN 20 MG PO TABS
20.0000 mg | ORAL_TABLET | Freq: Every day | ORAL | Status: DC
  Administered 2021-10-29: 20 mg via ORAL
  Filled 2021-10-28 (×2): qty 1

## 2021-10-28 MED ORDER — ENSURE PRE-SURGERY PO LIQD
296.0000 mL | Freq: Once | ORAL | Status: DC
  Filled 2021-10-28: qty 296

## 2021-10-28 MED ORDER — SURGIPHOR WOUND IRRIGATION SYSTEM - OPTIME
TOPICAL | Status: DC | PRN
  Administered 2021-10-28: 450 mL

## 2021-10-28 SURGICAL SUPPLY — 72 items
ATTUNE MED DOME PAT 32 KNEE (Knees) ×1 IMPLANT
ATTUNE PSFEM RTSZ5 NARCEM KNEE (Femur) ×1 IMPLANT
ATTUNE PSRP INSR SZ5 8 KNEE (Insert) ×1 IMPLANT
BASE TIBIAL ROT PLAT SZ 3 KNEE (Knees) IMPLANT
BATTERY INSTRU NAVIGATION (MISCELLANEOUS) ×8 IMPLANT
BLADE SAW 70X12.5 (BLADE) ×2 IMPLANT
BLADE SAW 90X13X1.19 OSCILLAT (BLADE) ×2 IMPLANT
BLADE SAW 90X25X1.19 OSCILLAT (BLADE) ×2 IMPLANT
BONE CEMENT GENTAMICIN (Cement) ×4 IMPLANT
CATH COUDE FOLEY 5CC 14FR (CATHETERS) ×1 IMPLANT
CEMENT BONE GENTAMICIN 40 (Cement) IMPLANT
COOLER POLAR GLACIER W/PUMP (MISCELLANEOUS) ×2 IMPLANT
DRAPE 3/4 80X56 (DRAPES) ×2 IMPLANT
DRAPE INCISE IOBAN 66X45 STRL (DRAPES) ×2 IMPLANT
DRSG DERMACEA 8X12 NADH (GAUZE/BANDAGES/DRESSINGS) ×2 IMPLANT
DRSG MEPILEX SACRM 8.7X9.8 (GAUZE/BANDAGES/DRESSINGS) ×2 IMPLANT
DRSG OPSITE POSTOP 4X14 (GAUZE/BANDAGES/DRESSINGS) ×2 IMPLANT
DRSG TEGADERM 4X4.75 (GAUZE/BANDAGES/DRESSINGS) ×2 IMPLANT
DURAPREP 26ML APPLICATOR (WOUND CARE) ×4 IMPLANT
ELECT CAUTERY BLADE 6.4 (BLADE) ×3 IMPLANT
ELECT REM PT RETURN 9FT ADLT (ELECTROSURGICAL) ×2
ELECTRODE REM PT RTRN 9FT ADLT (ELECTROSURGICAL) ×1 IMPLANT
EX-PIN ORTHOLOCK NAV 4X150 (PIN) ×4 IMPLANT
GLOVE SRG 8 PF TXTR STRL LF DI (GLOVE) ×1 IMPLANT
GLOVE SURG ENC TEXT LTX SZ7.5 (GLOVE) ×8 IMPLANT
GLOVE SURG UNDER POLY LF SZ7.5 (GLOVE) ×2 IMPLANT
GLOVE SURG UNDER POLY LF SZ8 (GLOVE) ×1
GOWN STRL REUS W/ TWL LRG LVL3 (GOWN DISPOSABLE) ×2 IMPLANT
GOWN STRL REUS W/ TWL XL LVL3 (GOWN DISPOSABLE) ×1 IMPLANT
GOWN STRL REUS W/TWL LRG LVL3 (GOWN DISPOSABLE) ×2
GOWN STRL REUS W/TWL XL LVL3 (GOWN DISPOSABLE) ×1
HEMOVAC 400CC 10FR (MISCELLANEOUS) ×2 IMPLANT
HOLDER FOLEY CATH W/STRAP (MISCELLANEOUS) ×2 IMPLANT
HOLSTER ELECTROSUGICAL PENCIL (MISCELLANEOUS) ×2 IMPLANT
IRRIGATION SURGIPHOR STRL (IV SOLUTION) ×1 IMPLANT
IV NS IRRIG 3000ML ARTHROMATIC (IV SOLUTION) ×2 IMPLANT
KIT TURNOVER KIT A (KITS) ×2 IMPLANT
KNIFE SCULPS 14X20 (INSTRUMENTS) ×2 IMPLANT
LABEL OR SOLS (LABEL) ×2 IMPLANT
MANIFOLD NEPTUNE II (INSTRUMENTS) ×4 IMPLANT
NDL SAFETY ECLIPSE 18X1.5 (NEEDLE) ×1 IMPLANT
NDL SPNL 20GX3.5 QUINCKE YW (NEEDLE) ×2 IMPLANT
NEEDLE HYPO 18GX1.5 SHARP (NEEDLE) ×1
NEEDLE SPNL 20GX3.5 QUINCKE YW (NEEDLE) ×4 IMPLANT
PACK TOTAL KNEE (MISCELLANEOUS) ×2 IMPLANT
PAD ABD DERMACEA PRESS 5X9 (GAUZE/BANDAGES/DRESSINGS) ×4 IMPLANT
PAD WRAPON POLAR KNEE (MISCELLANEOUS) ×1 IMPLANT
PENCIL SMOKE EVACUATOR COATED (MISCELLANEOUS) ×2 IMPLANT
PIN DRILL FIX HALF THREAD (BIT) ×4 IMPLANT
PIN DRILL QUICK PACK ×4 IMPLANT
PIN FIXATION 1/8DIA X 3INL (PIN) ×2 IMPLANT
PULSAVAC PLUS IRRIG FAN TIP (DISPOSABLE) ×2
SOL PREP PVP 2OZ (MISCELLANEOUS) ×2
SOLUTION PREP PVP 2OZ (MISCELLANEOUS) ×1 IMPLANT
SPONGE DRAIN TRACH 4X4 STRL 2S (GAUZE/BANDAGES/DRESSINGS) ×2 IMPLANT
STAPLER SKIN PROX 35W (STAPLE) ×2 IMPLANT
STOCKINETTE IMPERV 14X48 (MISCELLANEOUS) ×1 IMPLANT
STRAP TIBIA SHORT (MISCELLANEOUS) ×2 IMPLANT
SUCTION FRAZIER HANDLE 10FR (MISCELLANEOUS) ×1
SUCTION TUBE FRAZIER 10FR DISP (MISCELLANEOUS) ×1 IMPLANT
SUT VIC AB 0 CT1 36 (SUTURE) ×4 IMPLANT
SUT VIC AB 1 CT1 36 (SUTURE) ×4 IMPLANT
SUT VIC AB 2-0 CT2 27 (SUTURE) ×2 IMPLANT
SYR 20ML LL LF (SYRINGE) ×2 IMPLANT
SYR 30ML LL (SYRINGE) ×4 IMPLANT
TIBIAL BASE ROT PLAT SZ 3 KNEE (Knees) ×2 IMPLANT
TIP FAN IRRIG PULSAVAC PLUS (DISPOSABLE) ×1 IMPLANT
TOWEL OR 17X26 4PK STRL BLUE (TOWEL DISPOSABLE) ×2 IMPLANT
TOWER CARTRIDGE SMART MIX (DISPOSABLE) ×2 IMPLANT
TRAY FOLEY MTR SLVR 16FR STAT (SET/KITS/TRAYS/PACK) ×2 IMPLANT
WATER STERILE IRR 1000ML POUR (IV SOLUTION) ×2 IMPLANT
WRAPON POLAR PAD KNEE (MISCELLANEOUS) ×2

## 2021-10-28 NOTE — Telephone Encounter (Signed)
Called and left a VM for both daughters and requested a call back. ?

## 2021-10-28 NOTE — Transfer of Care (Signed)
Immediate Anesthesia Transfer of Care Note ? ?Patient: Kelli Smith ? ?Procedure(s) Performed: COMPUTER ASSISTED TOTAL KNEE ARTHROPLASTY (Right: Knee) ? ?Patient Location: PACU ? ?Anesthesia Type:Spinal ? ?Level of Consciousness: awake, alert  and oriented ? ?Airway & Oxygen Therapy: Patient Spontanous Breathing ? ?Post-op Assessment: Report given to RN and Post -op Vital signs reviewed and stable ? ?Post vital signs: Reviewed and stable ? ?Last Vitals:  ?Vitals Value Taken Time  ?BP 112/51 10/28/21 1444  ?Temp    ?Pulse 65 10/28/21 1444  ?Resp 19 10/28/21 1444  ?SpO2 98 % 10/28/21 1444  ? ? ?Last Pain:  ?Vitals:  ? 10/28/21 1444  ?TempSrc:   ?PainSc: 0-No pain  ?   ? ?  ? ?Complications: No notable events documented. ?

## 2021-10-28 NOTE — Progress Notes (Signed)
PT Cancellation Note ? ?Patient Details ?Name: Jameshia Hayashida ?MRN: [id number] ?DOB: Dec 30, 1940 ? ? ?Cancelled Treatment:    Reason Eval/Treat Not Completed: Other (comment).  PT consult received.  Chart reviewed.  Per discussion with pt's nurse, pt s/p spinal anesthesia and does not have adequate strength to participate in therapy at this time.  Will re-attempt PT evaluation tomorrow. ? ?Leitha Bleak, PT ?10/28/21, 4:58 PM ? ?

## 2021-10-28 NOTE — Progress Notes (Signed)
Notified family that patient is doing well, resting comfortably in bed. Waiting for room assignment. Ordered patient dinner to be brought to PACU. ?

## 2021-10-28 NOTE — Telephone Encounter (Signed)
Recall placed and printed  ?

## 2021-10-28 NOTE — Op Note (Signed)
OPERATIVE NOTE ? ?DATE OF SURGERY:  10/28/2021 ? ?PATIENT NAME:  Kelli Smith   ?DOB: [birth year]  ?MRN: [id number] ? ?PRE-OPERATIVE DIAGNOSIS: Degenerative arthrosis of the right knee, primary ? ?POST-OPERATIVE DIAGNOSIS:  Same ? ?PROCEDURE:  Right total knee arthroplasty using computer-assisted navigation ? ?SURGEON:  Marciano Sequin. M.D. ? ?ASSISTANT: Cassell Smiles, PA-C (present and scrubbed throughout the case, critical for assistance with exposure, retraction, instrumentation, and closure) ? ?ANESTHESIA: spinal ? ?ESTIMATED BLOOD LOSS: 50 mL ? ?FLUIDS REPLACED: 700 mL of crystalloid ? ?TOURNIQUET TIME: 87 minutes ? ?DRAINS: 2 medium Hemovac drains ? ?SOFT TISSUE RELEASES: Anterior cruciate ligament, posterior cruciate ligament, deep medial collateral ligament, patellofemoral ligament ? ?IMPLANTS UTILIZED: DePuy Attune size 5N posterior stabilized femoral component (cemented), size 3 rotating platform tibial component (cemented), 32 mm medialized dome patella (cemented), and an 8 mm stabilized rotating platform polyethylene insert. ? ?INDICATIONS FOR SURGERY: Kelli Smith is a 81 y.o. year old female with a long history of progressive knee pain. X-rays demonstrated severe degenerative changes in tricompartmental fashion. The patient had not seen any significant improvement despite conservative nonsurgical intervention. After discussion of the risks and benefits of surgical intervention, the patient expressed understanding of the risks benefits and agree with plans for total knee arthroplasty.  ? ?The risks, benefits, and alternatives were discussed at length including but not limited to the risks of infection, bleeding, nerve injury, stiffness, blood clots, the need for revision surgery, cardiopulmonary complications, among others, and they were willing to proceed. ? ?PROCEDURE IN DETAIL: The patient was brought into the operating room and, after adequate spinal anesthesia was achieved, a tourniquet was placed on  the patient's upper thigh. The patient's knee and leg were cleaned and prepped with alcohol and DuraPrep and draped in the usual sterile fashion. A "timeout" was performed as per usual protocol. The lower extremity was exsanguinated using an Esmarch, and the tourniquet was inflated to 300 mmHg. An anterior longitudinal incision was made followed by a standard mid vastus approach. The deep fibers of the medial collateral ligament were elevated in a subperiosteal fashion off of the medial flare of the tibia so as to maintain a continuous soft tissue sleeve. The patella was subluxed laterally and the patellofemoral ligament was incised. Inspection of the knee demonstrated severe degenerative changes with full-thickness loss of articular cartilage. Osteophytes were debrided using a rongeur. Anterior and posterior cruciate ligaments were excised. Two 4.0 mm Schanz pins were inserted in the femur and into the tibia for attachment of the array of trackers used for computer-assisted navigation. Hip center was identified using a circumduction technique. Distal landmarks were mapped using the computer. The distal femur and proximal tibia were mapped using the computer. The distal femoral cutting guide was positioned using computer-assisted navigation so as to achieve a 5? distal valgus cut. The femur was sized and it was felt that a size 5N femoral component was appropriate. A size 5 femoral cutting guide was positioned and the anterior cut was performed and verified using the computer. This was followed by completion of the posterior and chamfer cuts. Femoral cutting guide for the central box was then positioned in the center box cut was performed. ? ?Attention was then directed to the proximal tibia. Medial and lateral menisci were excised. The extramedullary tibial cutting guide was positioned using computer-assisted navigation so as to achieve a 0? varus-valgus alignment and 3? posterior slope. The cut was performed and  verified using the computer. The proximal tibia was sized and  it was felt that a size 3 tibial tray was appropriate. Tibial and femoral trials were inserted followed by insertion of an 8 mm polyethylene insert. This allowed for excellent mediolateral soft tissue balancing both in flexion and in full extension. Finally, the patella was cut and prepared so as to accommodate a 32 mm medialized dome patella. A patella trial was placed and the knee was placed through a range of motion with excellent patellar tracking appreciated. The femoral trial was removed after debridement of posterior osteophytes. The central post-hole for the tibial component was reamed followed by insertion of a keel punch. Tibial trials were then removed. Cut surfaces of bone were irrigated with copious amounts of normal saline using pulsatile lavage and then suctioned dry. Polymethylmethacrylate cement with gentamicin was prepared in the usual fashion using a vacuum mixer. Cement was applied to the cut surface of the proximal tibia as well as along the undersurface of a size 3 rotating platform tibial component. Tibial component was positioned and impacted into place. Excess cement was removed using [employer] fast streamer. Cement was then applied to the cut surfaces of the femur as well as along the posterior flanges of the size 5N femoral component. The femoral component was positioned and impacted into place. Excess cement was removed using [employer] fast streamer. An 8 mm polyethylene trial was inserted and the knee was brought into full extension with steady axial compression applied. Finally, cement was applied to the backside of a 32 mm medialized dome patella and the patellar component was positioned and patellar clamp applied. Excess cement was removed using [employer] fast streamer. After adequate curing of the cement, the tourniquet was deflated after a total tourniquet time of 87 minutes. Hemostasis was achieved using electrocautery. The knee was irrigated  with copious amounts of normal saline using pulsatile lavage followed by 450 ml of Surgiphor and then suctioned dry. 20 mL of 1.3% Exparel and 60 mL of 0.25% Marcaine in 40 mL of normal saline was injected along the posterior capsule, medial and lateral gutters, and along the arthrotomy site. An 8 mm stabilized rotating platform polyethylene insert was inserted and the knee was placed through a range of motion with excellent mediolateral soft tissue balancing appreciated and excellent patellar tracking noted. 2 medium drains were placed in the wound bed and brought out through separate stab incisions. The medial parapatellar portion of the incision was reapproximated using interrupted sutures of #1 Vicryl. Subcutaneous tissue was approximated in layers using first #0 Vicryl followed #2-0 Vicryl. The skin was approximated with skin staples. A sterile dressing was applied. ? ?The patient tolerated the procedure well and was transported to the recovery room in stable condition.   ? ?Manraj Yeo P. Holley Bouche., M.D.  ?

## 2021-10-28 NOTE — Anesthesia Procedure Notes (Signed)
Spinal ? ?Patient location during procedure: OR ?Start time: 10/28/2021 11:21 AM ?End time: 10/28/2021 11:25 AM ?Reason for block: surgical anesthesia ?Staffing ?Performed: resident/CRNA  ?Resident/CRNA: Lia Foyer, CRNA ?Preanesthetic Checklist ?Completed: patient identified, IV checked, site marked, risks and benefits discussed, surgical consent, monitors and equipment checked, pre-op evaluation and timeout performed ?Spinal Block ?Patient position: sitting ?Prep: DuraPrep ?Patient monitoring: heart rate, cardiac monitor, continuous pulse ox and blood pressure ?Approach: midline ?Location: L3-4 ?Injection technique: single-shot ?Needle ?Needle type: Pencan  ?Needle gauge: 25 G ?Needle length: 9 cm ?Assessment ?Sensory level: T4 ?Events: CSF return ? ? ? ?

## 2021-10-28 NOTE — H&P (Signed)
The patient has been re-examined, and the chart reviewed, and there have been no interval changes to the documented history and physical.    The risks, benefits, and alternatives have been discussed at length. The patient expressed understanding of the risks benefits and agreed with plans for surgical intervention.  Jahziel Sinn P. Donyetta Ogletree, Jr. M.D.    

## 2021-10-28 NOTE — Anesthesia Preprocedure Evaluation (Addendum)
Anesthesia Evaluation  ?Patient identified by MRN, date of birth, ID band ?Patient awake ? ?General Assessment Comment: ? ?Last xarelto dose Thursday (4 days prior) ? ?Reviewed: ?Allergy & Precautions, NPO status , Patient's Chart, lab work & pertinent test results ? ?History of Anesthesia Complications ?Negative for: history of anesthetic complications ? ?Airway ?Mallampati: II ? ?TM Distance: >3 FB ?Neck ROM: Full ? ? ? Dental ? ?(+) Edentulous Upper, Edentulous Lower ?  ?Pulmonary ?neg pulmonary ROS, neg sleep apnea, neg COPD, Patient abstained from smoking.Not current smoker,  ?  ?Pulmonary exam normal ?breath sounds clear to auscultation ? ? ? ? ? ? Cardiovascular ?Exercise Tolerance: Good ?METShypertension, + CAD  ?(-) Past MI + dysrhythmias Atrial Fibrillation + Valvular Problems/Murmurs MR  ?Rhythm:Irregular Rate:Normal ?- Systolic murmurs ? Transesophageal echocardiogram performed on 11/07/2020 revealed a low normal left ventricular systolic function with an EF of 50%.  Left atrium was moderately dilated.  There was mild to moderate mitral valve regurgitation, and trivial aortic valve regurgitation.  There was no evidence of atrial level shunting detected by color-flow Doppler. ?? ?  ?Neuro/Psych ?negative neurological ROS ? negative psych ROS  ? GI/Hepatic ?neg GERD  ,(+)  ?  ? (-) substance abuse ? ,   ?Endo/Other  ?neg diabetes ? Renal/GU ?negative Renal ROS  ? ?  ?Musculoskeletal ? ?(+) Arthritis ,  ? Abdominal ?  ?Peds ? Hematology ? ?(+) Blood dyscrasia, anemia ,   ?Anesthesia Other Findings ?Past Medical History: ?No date: A-fib [facility]) ?    Comment:  a.) CHA2DS2-VASc = 5 (age x 2, sex, HTN, aortic plaque). ?             b.) s/p DCCV (200J x 2) 11/07/2020. c.) rate/rhythm  ?             maintained on oral amiodarone; chronically anticoagulated ?             using rivaroxaban. ?No date: Adrenal adenoma, left ?No date: Anemia ?No date: Aortic atherosclerosis ([place]) ?No  date: Arthritis ?    Comment:  knees, right shoulder  ?No date: B12 deficiency ?No date: Basal cell carcinoma ?No date: CAD (coronary artery disease) ?No date: Cyst of right kidney ?No date: DDD (degenerative disc disease), lumbar ?No date: History of chicken pox ?No date: History of kidney stones ?No date: HTN (hypertension) ?No date: Long term current use of anticoagulant ?    Comment:  a.) rivaroxaban ?No date: Memory loss ?No date: Microcytosis ?No date: Osteopenia ?No date: PAC (premature atrial contraction) ?No date: Thyromegaly ? Reproductive/Obstetrics ? ?  ? ? ? ? ? ? ? ? ? ? ? ? ? ?  ?  ? ? ? ? ? ? ? ?Anesthesia Physical ?Anesthesia Plan ? ?ASA: 3 ? ?Anesthesia Plan: Spinal  ? ?Post-op Pain Management: Gabapentin PO (pre-op)* and Tylenol PO (pre-op)*  ? ?Induction: Intravenous ? ?PONV Risk Score and Plan: 2 and Ondansetron, Dexamethasone, Propofol infusion, TIVA and Treatment may vary due to age or medical condition ? ?Airway Management Planned: Natural Airway ? ?Additional Equipment: None ? ?Intra-op Plan:  ? ?Post-operative Plan:  ? ?Informed Consent: I have reviewed the patients History and Physical, chart, labs and discussed the procedure including the risks, benefits and alternatives for the proposed anesthesia with the patient or authorized representative who has indicated his/her understanding and acceptance.  ? ? ? ? ? ?Plan Discussed with: CRNA and Surgeon ? ?Anesthesia Plan Comments: (Discussed R/B/A of neuraxial anesthesia technique with patient: ?-  rare risks of spinal/epidural hematoma, nerve damage, infection ?- Risk of PDPH ?- Risk of nausea and vomiting ?- Risk of conversion to general anesthesia and its associated risks, including sore throat, damage to lips/eyes/teeth/oropharynx, and rare risks such as cardiac and respiratory events. ?- Risk of allergic reactions ? ?Discussed the role of CRNA in patient's perioperative care. ? ?Patient voiced understanding.)  ? ? ? ? ? ?Anesthesia Quick  Evaluation ? ?

## 2021-10-28 NOTE — Telephone Encounter (Signed)
-----   Message from Kate Sable, MD sent at 10/28/2021  8:08 AM EDT ----- ?Thyroid function abnormal.  Stop amiodarone.  Refer patient to EP/Dr. Quentin Ore for additional input.  Repeat TSH and free T4 in 6 weeks. ?

## 2021-10-28 NOTE — Telephone Encounter (Signed)
Spoke with patients daughter [name] and informed her of the result note. Patient will stop Amiodarone. Patient has been scheduled with EP. Patients daughter verbalized understanding and agreed with plan. ? ?Will route to scheduling for a recall on the 6 week f/u labs (Free T4 and TSH). ?

## 2021-10-29 DIAGNOSIS — Z96652 Presence of left artificial knee joint: Secondary | ICD-10-CM | POA: Diagnosis not present

## 2021-10-29 DIAGNOSIS — I4891 Unspecified atrial fibrillation: Secondary | ICD-10-CM | POA: Diagnosis not present

## 2021-10-29 DIAGNOSIS — M1711 Unilateral primary osteoarthritis, right knee: Secondary | ICD-10-CM | POA: Diagnosis not present

## 2021-10-29 NOTE — Progress Notes (Signed)
?  Subjective: ?1 Day Post-Op Procedure(s) (LRB): ?COMPUTER ASSISTED TOTAL KNEE ARTHROPLASTY (Right) ?Daughter at bedside. ?Patient reports pain as well-controlled.   ?Patient is well, and has had no acute complaints or problems ?Plan is to go Home after hospital stay. ?Negative for chest pain and shortness of breath ?Fever: no ?Gastrointestinal: negative for nausea and vomiting.  Patient has not had a bowel movement. ? ?Objective: ?Vital signs in last 24 hours: ?Temp:  [97.6 ?F (36.4 ?C)-98.3 ?F (36.8 ?C)] 98 ?F (36.7 ?C) (04/04 9937) ?Pulse Rate:  [60-80] 64 (04/04 0647) ?Resp:  [15-19] 16 (04/04 0647) ?BP: (112-161)/(51-66) 119/56 (04/04 1696) ?SpO2:  [96 %-100 %] 99 % (04/04 0647) ?Weight:  [55.3 kg] 55.3 kg (04/03 0954) ? ?Intake/Output from previous day: ? ?Intake/Output Summary (Last 24 hours) at 10/29/2021 7893 ?Last data filed at 10/28/2021 2302 ?Gross per 24 hour  ?Intake 1140 ml  ?Output 1052 ml  ?Net 88 ml  ?  ?Intake/Output this shift: ?No intake/output data recorded. ? ?Labs: ?No results for input(s): HGB in the last 72 hours. ?No results for input(s): WBC, RBC, HCT, PLT in the last 72 hours. ?No results for input(s): NA, K, CL, CO2, BUN, CREATININE, GLUCOSE, CALCIUM in the last 72 hours. ?No results for input(s): LABPT, INR in the last 72 hours. ? ? ?EXAM ?General - Patient is Alert, Appropriate, and Oriented ?Extremity - Neurovascular intact ?Dorsiflexion/Plantar flexion intact ?Compartment soft ?Dressing/Incision -Postoperative dressing remains in place., Polar Care in place and working. , Hemovac in place.  ?Motor Function - intact, moving foot and toes well on exam. Able to perform SLR with assistance.   ?Cardiovascular- Regular rate and rhythm, no murmurs/rubs/gallops ?Respiratory- Lungs clear to auscultation bilaterally ?Gastrointestinal- soft, nontender, and active bowel sounds ? ? ?Assessment/Plan: ?1 Day Post-Op Procedure(s) (LRB): ?COMPUTER ASSISTED TOTAL KNEE ARTHROPLASTY (Right) ?Principal  Problem: ?  Total knee replacement status ? ?Estimated body mass index is 21.61 kg/m? as calculated from the following: ?  Height as of this encounter: '5\' 3"'$  (1.6 m). ?  Weight as of this encounter: 55.3 kg. ?Advance diet ?Up with therapy ? ? ?Possible d/c this PM pending PT clearance.  ?  ? ?DVT Prophylaxis - Xarelto, Ted hose, and SCDs ?Weight-Bearing as tolerated to right leg ? ?Cassell Smiles, PA-C ?[facility] Orthopaedic Surgery ?10/29/2021, 8:12 AM ? ?

## 2021-10-29 NOTE — Plan of Care (Signed)
  Problem: Education: Goal: Knowledge of General Education information will improve Description: Including pain rating scale, medication(s)/side effects and non-pharmacologic comfort measures Outcome: Progressing   Problem: Health Behavior/Discharge Planning: Goal: Ability to manage health-related needs will improve Outcome: Progressing   Problem: Clinical Measurements: Goal: Ability to maintain clinical measurements within normal limits will improve Outcome: Progressing Goal: Will remain free from infection Outcome: Progressing Goal: Cardiovascular complication will be avoided Outcome: Progressing   Problem: Nutrition: Goal: Adequate nutrition will be maintained Outcome: Progressing   

## 2021-10-29 NOTE — Anesthesia Postprocedure Evaluation (Signed)
Anesthesia Post Note ? ?Patient: Idella Lamontagne ? ?Procedure(s) Performed: COMPUTER ASSISTED TOTAL KNEE ARTHROPLASTY (Right: Knee) ? ?Patient location during evaluation: Nursing Unit ?Anesthesia Type: Spinal ?Level of consciousness: oriented and awake and alert ?Pain management: pain level controlled ?Vital Signs Assessment: post-procedure vital signs reviewed and stable ?Respiratory status: spontaneous breathing and respiratory function stable ?Cardiovascular status: blood pressure returned to baseline and stable ?Postop Assessment: no headache, no backache, no apparent nausea or vomiting and patient able to bend at knees ?Anesthetic complications: no ? ? ?No notable events documented. ? ? ?Last Vitals:  ?Vitals:  ? 10/28/21 1942 10/29/21 0647  ?BP: (!) 147/55 (!) 119/56  ?Pulse: 70 64  ?Resp: 16 16  ?Temp: 36.8 ?C 36.7 ?C  ?SpO2: 100% 99%  ?  ?Last Pain:  ?Vitals:  ? 10/29/21 0514  ?TempSrc:   ?PainSc: 6   ? ? ?  ?  ?  ?  ?  ?  ? ?Timisha Mondry B Charae Depaolis ? ? ? ? ?

## 2021-10-29 NOTE — Plan of Care (Signed)
?  Problem: Education: ?Goal: Knowledge of General Education information will improve ?Description: Including pain rating scale, medication(s)/side effects and non-pharmacologic comfort measures ?Outcome: Progressing ?  ?Problem: Health Behavior/Discharge Planning: ?Goal: Ability to manage health-related needs will improve ?Outcome: Progressing ?  ?Problem: Clinical Measurements: ?Goal: Ability to maintain clinical measurements within normal limits will improve ?Outcome: Progressing ?Goal: Will remain free from infection ?Outcome: Progressing ?Goal: Cardiovascular complication will be avoided ?Outcome: Progressing ?  ?Problem: Activity: ?Goal: Risk for activity intolerance will decrease ?Outcome: Progressing ?  ?Problem: Elimination: ?Goal: Will not experience complications related to bowel motility ?Outcome: Progressing ?Goal: Will not experience complications related to urinary retention ?Outcome: Progressing ?  ?Problem: Pain Managment: ?Goal: General experience of comfort will improve ?Outcome: Progressing ?  ?Problem: Safety: ?Goal: Ability to remain free from injury will improve ?Outcome: Progressing ?  ?Problem: Skin Integrity: ?Goal: Risk for impaired skin integrity will decrease ?Outcome: Progressing ?  ?Problem: Nutrition: ?Goal: Adequate nutrition will be maintained ?Outcome: Adequate for Discharge ?  ?

## 2021-10-29 NOTE — Evaluation (Signed)
Occupational Therapy Evaluation ?Patient Details ?Name: Kelli Smith ?MRN: [id number] ?DOB: 1940-09-19 ?Today's Date: 10/29/2021 ? ? ?History of Present Illness Pt admitted for R TKR 10/29/19; PMH significant for a fib, DDD, lumbar radiculaopathy, L TKR, memory loss, osteoarthritis, osteopenia  ? ?Clinical Impression ?  ?Pt seen for OT evaluation this date, POD#1 from above surgery. Pt was MOD I in all ADLs prior to surgery, however using SPC for mobility due to R knee pain. Pt lives with daughter who assists with IADLs.  Pt and daughter instructed in polar care mgt, falls prevention strategies, home/routines modifications, DME/AE for LB bathing and dressing tasks. Pt performing functional mobility with supervision-CGA, toilet transfer with CGA-MIN A. Standing ADL/grooming tasks completed with supervision with RW. MAX A required for LB dressing. Pt has had a previous knee replacement, demonstrates fair-good safety awareness during ADL tasks. Pt would benefit from skilled OT services including additional instruction in dressing techniques with or without assistive devices for dressing and bathing skills to support recall and carryover prior to discharge and ultimately to maximize safety, independence, and minimize falls risk and caregiver burden. Do not currently anticipate any OT needs following this hospitalization.  Pt is left as received, NAD, all needs met. OT will continue to follow acutely.  ?   ? ?Recommendations for follow up therapy are one component of a multi-disciplinary discharge planning process, led by the attending physician.  Recommendations may be updated based on patient status, additional functional criteria and insurance authorization.  ? ?Follow Up Recommendations ? No OT follow up  ?  ?Assistance Recommended at Discharge Intermittent Supervision/Assistance  ?Patient can return home with the following A little help with walking and/or transfers;A little help with  bathing/dressing/bathroom;Assistance with cooking/housework;Direct supervision/assist for financial management;Assist for transportation;Help with stairs or ramp for entrance;Direct supervision/assist for medications management ? ?  ?Functional Status Assessment ? Patient has had a recent decline in their functional status and demonstrates the ability to make significant improvements in function in a reasonable and predictable amount of time.  ?Equipment Recommendations ? None recommended by OT;Other (comment) (pt has recommended equipment)  ?  ?Recommendations for Other Services   ? ? ?  ?Precautions / Restrictions Precautions ?Precautions: Knee ?Restrictions ?Weight Bearing Restrictions: Yes ?RLE Weight Bearing: Weight bearing as tolerated  ? ?  ? ?Mobility Bed Mobility ?  ?  ?  ?  ?  ?  ?  ?General bed mobility comments: pt recieved in recliner, returned to recliner following eval ?  ? ?Transfers ?Overall transfer level: Needs assistance ?Equipment used: Rolling walker (2 wheels) ?Transfers: Sit to/from Stand ?Sit to Stand: Supervision, Min guard ?  ?  ?  ?  ?  ?General transfer comment: supervision 1x, CGA 1x for STS with RW; intermittent vcs required for body mechanics ?  ? ?  ?Balance Overall balance assessment: Needs assistance ?Sitting-balance support: Feet supported ?Sitting balance-Leahy Scale: Good ?  ?  ?Standing balance support: Bilateral upper extremity supported, During functional activity, Reliant on assistive device for balance ?Standing balance-Leahy Scale: Fair ?  ?  ?  ?  ?  ?  ?  ?  ?  ?  ?  ?  ?   ? ?ADL either performed or assessed with clinical judgement  ? ?ADL Overall ADL's : Needs assistance/impaired ?Eating/Feeding: Set up;Sitting ?  ?Grooming: Wash/dry hands;Wash/dry face;Oral care;Standing;Supervision/safety;Min guard ?Grooming Details (indicate cue type and reason): sink level ?  ?  ?  ?  ?Upper Body Dressing : Set up;Sitting ?  ?  Lower Body Dressing: Maximal assistance;Sitting/lateral  leans ?  ?Toilet Transfer: Min guard;Minimal assistance;Rolling walker (2 wheels);Ambulation ?  ?Toileting- Water quality scientist and Hygiene: Min guard;Sit to/from stand ?  ?  ?  ?Functional mobility during ADLs: Supervision/safety;Min guard;Rolling walker (2 wheels) (throughout room to restroom) ?   ? ? ? ?Vision Baseline Vision/History: 1 Wears glasses ?Patient Visual Report: No change from baseline ?   ?   ?Perception   ?  ?Praxis   ?  ? ?Pertinent Vitals/Pain Pain Assessment ?Pain Assessment: 0-10 ?Pain Score: 3  ?Pain Location: R knee ?Pain Descriptors / Indicators: Aching ?Pain Intervention(s): Limited activity within patient's tolerance, Monitored during session, Repositioned, Other (comment) (rn present to give pain meds at end of session)  ? ? ? ?Hand Dominance Right ?  ?Extremity/Trunk Assessment Upper Extremity Assessment ?Upper Extremity Assessment: Generalized weakness;RUE deficits/detail ?RUE Deficits / Details: RUE with pain with shoudler flexion- daugther reports this is baseline with arthritis ?  ?Lower Extremity Assessment ?Lower Extremity Assessment: Defer to PT evaluation;RLE deficits/detail ?RLE Deficits / Details: s/p R TKR ?  ?Cervical / Trunk Assessment ?Cervical / Trunk Assessment: Kyphotic ?  ?Communication Communication ?Communication: No difficulties ?  ?Cognition Arousal/Alertness: Awake/alert ?Behavior During Therapy: [facility] for tasks assessed/performed ?Overall Cognitive Status: History of cognitive impairments - at baseline ?  ?  ?  ?  ?  ?  ?  ?  ?  ?  ?  ?  ?  ?  ?  ?  ?General Comments: pt daugther reports baseline memory deficits; pt is oriented to self, place, situation; not oriented to date; Good one step direction following and noted fair-good safety during ADL tasks with supervision ?  ?  ?General Comments    ? ?  ?Exercises   ?  ?Shoulder Instructions    ? ? ?Home Living Family/patient expects to be discharged to:: Private residence ?Living Arrangements: Children ?Available  Help at Discharge: Family;Available 24 hours/day ?Type of Home: House ?Home Access: Stairs to enter ?Entrance Stairs-Number of Steps: 1 ?Entrance Stairs-Rails: None (leage on the right) ?Home Layout: Two level;Able to live on main level with bedroom/bathroom ?Alternate Level Stairs-Number of Steps: pt lives in a townhouse with daugther- 15 stairs to get up to bedroom, pt will sleep on first floor upon discharge ?  ?Bathroom Shower/Tub: Tub/shower unit ?  ?Bathroom Toilet: Handicapped height ?Bathroom Accessibility: Yes ?  ?Home Equipment: Kasandra Knudsen - single Location manager (2 wheels) ?  ?Additional Comments: grab bar in tub- suction ?  ? ?  ?Prior Functioning/Environment Prior Level of Function : Needs assist ?  ?  ?  ?Physical Assist : ADLs (physical) ?  ?ADLs (physical): IADLs ?Mobility Comments: use of SPC after L TKR ?ADLs Comments: MOD I for ADL; daughter assists with IADLs; ?  ? ?  ?  ?OT Problem List: Decreased strength;Decreased activity tolerance;Decreased knowledge of use of DME or AE ?  ?   ?OT Treatment/Interventions: Self-care/ADL training;Therapeutic exercise;Patient/family education;Balance training;Energy conservation;DME and/or AE instruction;Therapeutic activities  ?  ?OT Goals(Current goals can be found in the care plan section) Acute Rehab OT Goals ?Patient Stated Goal: go home ?OT Goal Formulation: With patient/family ?Time For Goal Achievement: 11/12/21 ?Potential to Achieve Goals: Good ?ADL Goals ?Pt Will Perform Grooming: with supervision;standing ?Pt Will Perform Upper Body Dressing: sitting;with supervision ?Pt Will Perform Lower Body Dressing: with supervision;with adaptive equipment;sit to/from stand ?Pt Will Transfer to Toilet: with supervision;ambulating;bedside commode ?Pt Will Perform Toileting - Clothing Manipulation and hygiene: with supervision;sit to/from  stand  ?OT Frequency: Min 2X/week ?  ? ?Co-evaluation   ?  ?  ?  ?  ? ?  ?AM-PAC OT "6 Clicks" Daily  Activity     ?Outcome Measure Help from another person eating meals?: None ?Help from another person taking care of personal grooming?: None ?Help from another person toileting, which includes using toliet, bedpan, or urinal?: None ?Help from

## 2021-10-29 NOTE — TOC Progression Note (Signed)
Transition of Care (TOC) - Progression Note  ? ? ?Patient Details  ?Name: Louanne Calvillo ?MRN: [id number] ?Date of Birth: Nov 17, 1940 ? ?Transition of Care (TOC) CM/SW Contact  ?Conception Oms, RN ?Phone Number: ?10/29/2021, 8:53 AM ? ?Clinical Narrative:   The patient is set up with [facility] for [facility] services prior to Surgery By the Surgeon's office ? ? ? ?  ?  ? ?Expected Discharge Plan and Services ?  ?  ?  ?  ?  ?                ?  ?  ?  ?  ?  ?  ?  ?  ?  ?  ? ? ?Social Determinants of Health (SDOH) Interventions ?  ? ?Readmission Risk Interventions ?   ? View : No data to display.  ?  ?  ?  ? ? ?

## 2021-10-29 NOTE — Evaluation (Addendum)
Physical Therapy Evaluation Patient Details Name: Kelli Smith MRN: [id number] DOB: [birth year] Today's Date: 10/29/2021  History of Present Illness  Pt admitted for R TKR 10/29/19; PMH significant for a fib, DDD, lumbar radiculaopathy, L TKR, memory loss, osteoarthritis, osteopenia   Clinical Impression  Pt received seated in recliner upon arrival to room and pt agreeable to therapy.  Pt pleasant and put forth good effort during evaluation and treatment.  Pt able to perform seated exercises with good technique.  Pt then transitioned to standing from seated position with CGA for safety.  Pt ambulated ~40 feet before needing to return to the chair.  Pt will continue to benefit from skilled therapy to address endurance and other deficits at this time.  Pt left in chair with all needs met, call bell within reach, and daughter in room.  Pt and daughter educated on HHPT and was directed to speaking with case management about any other home care questions.  Anticipate pt d/c to home with HHPT at this time with continued progress.  Will re-adjust recommendations as necessary.  Pt will continue to benefit from skilled therapy in order to address deficits listed below.     Pt able to achieve 85 deg of R knee flexion an lacking 10 deg with QS in sitting, likely due to bandaging.   Recommendations for follow up therapy are one component of a multi-disciplinary discharge planning process, led by the attending physician.  Recommendations may be updated based on patient status, additional functional criteria and insurance authorization.  Follow Up Recommendations Home health PT    Assistance Recommended at Discharge Intermittent Supervision/Assistance  Patient can return home with the following  A little help with walking and/or transfers;A little help with bathing/dressing/bathroom    Equipment Recommendations None recommended by PT  Recommendations for Other Services       Functional Status Assessment  Patient has had a recent decline in their functional status and demonstrates the ability to make significant improvements in function in a reasonable and predictable amount of time.     Precautions / Restrictions Precautions Precautions: Knee Restrictions Weight Bearing Restrictions: Yes RLE Weight Bearing: Weight bearing as tolerated      Mobility  Bed Mobility               General bed mobility comments: pt received in recliner, returned to recliner following eval    Transfers Overall transfer level: Needs assistance Equipment used: Rolling walker (2 wheels) Transfers: Sit to/from Stand Sit to Stand: Min guard           General transfer comment: CGA for safety, VC's utilized for hand placement and body mechanics    Ambulation/Gait Ambulation/Gait assistance: Land (Feet): 40 Feet Assistive device: Rolling walker (2 wheels) Gait Pattern/deviations: Step-through pattern, Decreased step length - left, Decreased stance time - right, Decreased stride length, Antalgic Gait velocity: decreased     General Gait Details: Pt with fair mobility, pt noting that she does not trust the knee just yet.  Stairs            Wheelchair Mobility    Modified Rankin (Stroke Patients Only)       Balance Overall balance assessment: Needs assistance Sitting-balance support: Feet supported Sitting balance-Leahy Scale: Good     Standing balance support: Bilateral upper extremity supported, During functional activity, Reliant on assistive device for balance Standing balance-Leahy Scale: Fair  Pertinent Vitals/Pain Pain Assessment Pain Assessment: 0-10 Pain Score: 3  Pain Location: R knee Pain Descriptors / Indicators: Aching Pain Intervention(s): Limited activity within patient's tolerance, Monitored during session, Premedicated before session, Repositioned, Ice applied    Home Living Family/patient  expects to be discharged to:: Private residence Living Arrangements: Children Available Help at Discharge: Family;Available 24 hours/day Type of Home: House Home Access: Stairs to enter Entrance Stairs-Rails: None (leage on the right) Entrance Stairs-Number of Steps: 1 Alternate Level Stairs-Number of Steps: pt lives in a townhouse with daugther- 15 stairs to get up to bedroom, pt will sleep on first floor upon discharge Home Layout: Two level;Able to live on main level with bedroom/bathroom Home Equipment: Gilmer Mor - single point;BSC/3in1;Shower seat;Rolling Walker (2 wheels) Additional Comments: grab bar in tub- suction    Prior Function Prior Level of Function : Needs assist       Physical Assist : ADLs (physical)   ADLs (physical): IADLs Mobility Comments: use of SPC after L TKR ADLs Comments: MOD I for ADL; daughter assists with IADLs;     Hand Dominance   Dominant Hand: Right    Extremity/Trunk Assessment   Upper Extremity Assessment Upper Extremity Assessment: Generalized weakness;RUE deficits/detail RUE Deficits / Details: RUE with pain with shoudler flexion - daugther reports this is baseline with arthritis    Lower Extremity Assessment Lower Extremity Assessment: Generalized weakness;RLE deficits/detail RLE Deficits / Details: s/p R TKR    Cervical / Trunk Assessment Cervical / Trunk Assessment: Kyphotic  Communication   Communication: No difficulties  Cognition Arousal/Alertness: Awake/alert Behavior During Therapy: WFL for tasks assessed/performed Overall Cognitive Status: History of cognitive impairments - at baseline                                 General Comments: pt daugther reports baseline memory deficits; pt is oriented to self, place, situation; not oriented to date; Good one step direction following and noted fair-good safety during ADL tasks with supervision        General Comments      Exercises Total Joint Exercises Ankle  Circles/Pumps: AROM, Strengthening, Both, 10 reps, Seated Quad Sets: AROM, Strengthening, Both, 10 reps, Seated Gluteal Sets: AROM, Strengthening, Both, 10 reps, Seated Marching in Standing: AROM, Strengthening, Both, 10 reps, Standing   Assessment/Plan    PT Assessment Patient needs continued PT services  PT Problem List Decreased strength;Decreased range of motion;Decreased activity tolerance;Decreased balance;Decreased mobility;Decreased knowledge of use of DME;Decreased safety awareness;Pain       PT Treatment Interventions DME instruction;Gait training;Stair training;Functional mobility training;Therapeutic activities;Therapeutic exercise;Balance training;Neuromuscular re-education    PT Goals (Current goals can be found in the Care Plan section)  Acute Rehab PT Goals Patient Stated Goal: to go home and get better. PT Goal Formulation: With patient/family Time For Goal Achievement: 11/12/21 Potential to Achieve Goals: Good    Frequency BID     Co-evaluation               AM-PAC PT "6 Clicks" Mobility  Outcome Measure Help needed turning from your back to your side while in a flat bed without using bedrails?: A Little Help needed moving from lying on your back to sitting on the side of a flat bed without using bedrails?: A Little Help needed moving to and from a bed to a chair (including a wheelchair)?: A Little Help needed standing up from a chair using your arms (e.g., wheelchair  or bedside chair)?: A Little Help needed to walk in hospital room?: A Little Help needed climbing 3-5 steps with a railing? : A Lot 6 Click Score: 17    End of Session Equipment Utilized During Treatment: Gait belt Activity Tolerance: Patient tolerated treatment well Patient left: in chair;with call bell/phone within reach;with chair alarm set;with family/visitor present Nurse Communication: Mobility status PT Visit Diagnosis: Unsteadiness on feet (R26.81);Other abnormalities of gait and  mobility (R26.89);Muscle weakness (generalized) (M62.81);Difficulty in walking, not elsewhere classified (R26.2)    Time: 8295-6213 PT Time Calculation (min) (ACUTE ONLY): 24 min   Charges:   PT Evaluation $PT Eval Low Complexity: 1 Low PT Treatments $Gait Training: 8-22 mins        Nolon Bussing, PT, DPT 10/29/21, 1:00 PM   Phineas Real 10/29/2021, 12:57 PM

## 2021-10-29 NOTE — Progress Notes (Signed)
Physical Therapy Treatment ?Patient Details ?Name: Kelli Smith ?MRN: [id number] ?DOB: 06/22/1941 ?Today's Date: 10/29/2021 ? ? ?History of Present Illness Pt admitted for R TKR 10/29/19; PMH significant for a fib, DDD, lumbar radiculaopathy, L TKR, memory loss, osteoarthritis, osteopenia ? ?  ?PT Comments  ? ? Pt received seated in recliner upon arrival to room and pt agreeable to therapy.  Pt with much more stiffness in PM session and requires increased time and verbal cuing for hand placement during sit to stand.  Pt then able to ambulate to the bathroom and void and perform dynamic standing balance to perform self, peri-care.  Pt then transitioned to ambulating 75 feet around the nursing station and back to the room where she transferred back to bed and left with all needs met.  Current discharge plans to home with HHPT remain appropriate at this time as long as pt continues to progress with mobility.  Pt will continue to benefit from skilled therapy in order to address deficits listed below. ? ?   ?Recommendations for follow up therapy are one component of a multi-disciplinary discharge planning process, led by the attending physician.  Recommendations may be updated based on patient status, additional functional criteria and insurance authorization. ? ?Follow Up Recommendations ? Home health PT ?  ?  ?Assistance Recommended at Discharge Intermittent Supervision/Assistance  ?Patient can return home with the following A little help with walking and/or transfers;A little help with bathing/dressing/bathroom ?  ?Equipment Recommendations ? None recommended by PT  ?  ?Recommendations for Other Services   ? ? ?  ?Precautions / Restrictions Precautions ?Precautions: Knee ?Restrictions ?Weight Bearing Restrictions: Yes ?RLE Weight Bearing: Weight bearing as tolerated  ?  ? ?Mobility ? Bed Mobility ?  ?  ?  ?  ?  ?  ?  ?General bed mobility comments: pt received in recliner, returned to bed following treatment. ?   ? ?Transfers ?Overall transfer level: Needs assistance ?Equipment used: Rolling walker (2 wheels) ?Transfers: Sit to/from Stand ?Sit to Stand: Min guard ?  ?  ?  ?  ?  ?General transfer comment: CGA for safety, VC's utilized for hand placement and body mechanics ?  ? ?Ambulation/Gait ?Ambulation/Gait assistance: Min guard ?Gait Distance (Feet): 75 Feet ?Assistive device: Rolling walker (2 wheels) ?Gait Pattern/deviations: Step-through pattern, Decreased step length - left, Decreased stance time - right, Decreased stride length, Antalgic ?Gait velocity: decreased ?  ?  ?General Gait Details: Pt with 2-3 instances of the knee buckling that pt was able to correct without any significant LOB. ? ? ?Stairs ?  ?  ?  ?  ?  ? ? ?Wheelchair Mobility ?  ? ?Modified Rankin (Stroke Patients Only) ?  ? ? ?  ?Balance Overall balance assessment: Needs assistance ?Sitting-balance support: Feet supported ?Sitting balance-Leahy Scale: Good ?  ?  ?Standing balance support: Bilateral upper extremity supported, During functional activity, Reliant on assistive device for balance ?Standing balance-Leahy Scale: Fair ?  ?  ?  ?  ?  ?  ?  ?  ?  ?  ?  ?  ?  ? ?  ?Cognition Arousal/Alertness: Awake/alert ?Behavior During Therapy: [facility] for tasks assessed/performed ?Overall Cognitive Status: History of cognitive impairments - at baseline ?  ?  ?  ?  ?  ?  ?  ?  ?  ?  ?  ?  ?  ?  ?  ?  ?General Comments: pt daugther reports baseline memory deficits; pt is oriented to self,  place, situation; not oriented to date; Good one step direction following and noted fair-good safety during ADL tasks with supervision ?  ?  ? ?  ?Exercises Total Joint Exercises ?Ankle Circles/Pumps: AROM, Strengthening, Both, 10 reps, Seated ?Quad Sets: AROM, Strengthening, Both, 10 reps, Seated ?Gluteal Sets: AROM, Strengthening, Both, 10 reps, Seated ?Marching in Standing: AROM, Strengthening, Both, 10 reps, Standing ? ?  ?General Comments   ?  ?  ? ?Pertinent Vitals/Pain  Pain Assessment ?Pain Assessment: 0-10 ?Pain Score: 3  ?Pain Location: R knee ?Pain Descriptors / Indicators: Aching ?Pain Intervention(s): Limited activity within patient's tolerance, Monitored during session, Premedicated before session, Repositioned, Ice applied  ? ? ?Home Living Family/patient expects to be discharged to:: Private residence ?Living Arrangements: Children ?Available Help at Discharge: Family;Available 24 hours/day ?Type of Home: House ?Home Access: Stairs to enter ?Entrance Stairs-Rails: None (leage on the right) ?Entrance Stairs-Number of Steps: 1 ?Alternate Level Stairs-Number of Steps: pt lives in a townhouse with daugther- 15 stairs to get up to bedroom, pt will sleep on first floor upon discharge ?Home Layout: Two level;Able to live on main level with bedroom/bathroom ?Home Equipment: Kasandra Knudsen - single Location manager (2 wheels) ?Additional Comments: grab bar in tub- suction  ?  ?Prior Function    ?  ?  ?   ? ?PT Goals (current goals can now be found in the care plan section) Acute Rehab PT Goals ?Patient Stated Goal: to go home and get better. ?PT Goal Formulation: With patient/family ?Time For Goal Achievement: 11/12/21 ?Potential to Achieve Goals: Good ?Progress towards PT goals: Progressing toward goals ? ?  ?Frequency ? ? ? BID ? ? ? ?  ?PT Plan    ? ? ?Co-evaluation   ?  ?  ?  ?  ? ?  ?AM-PAC PT "6 Clicks" Mobility   ?Outcome Measure ? Help needed turning from your back to your side while in a flat bed without using bedrails?: A Little ?Help needed moving from lying on your back to sitting on the side of a flat bed without using bedrails?: A Little ?Help needed moving to and from a bed to a chair (including a wheelchair)?: A Little ?Help needed standing up from a chair using your arms (e.g., wheelchair or bedside chair)?: A Little ?Help needed to walk in hospital room?: A Little ?Help needed climbing 3-5 steps with a railing? : A Lot ?6 Click Score: 17 ? ?  ?End  of Session Equipment Utilized During Treatment: Gait belt ?Activity Tolerance: Patient tolerated treatment well ?Patient left: in chair;with call bell/phone within reach;with chair alarm set;with family/visitor present ?Nurse Communication: Mobility status ?PT Visit Diagnosis: Unsteadiness on feet (R26.81);Other abnormalities of gait and mobility (R26.89);Muscle weakness (generalized) (M62.81);Difficulty in walking, not elsewhere classified (R26.2) ?  ? ? ?Time: 1432-1500 ?PT Time Calculation (min) (ACUTE ONLY): 28 min ? ?Charges:  $Gait Training: 23-37 mins          ?          ? ?Gwenlyn Saran, PT, DPT ?10/29/21, 4:00 PM ? ? ? ?Christie Nottingham ?10/29/2021, 4:00 PM ? ?

## 2021-10-29 NOTE — Discharge Summary (Addendum)
?Physician Discharge Summary  ?Patient ID: ?Kelli Smith ?MRN: [id number] ?DOB/AGE: 82-Dec-1942 82 y.o. ? ?Admit date: 10/28/2021 ?Discharge date: 10/30/2021 ? ?Admission Diagnoses:  ?Total knee replacement status [Z96.659] ? ?Surgeries:Procedure(s): ?Right total knee arthroplasty using computer-assisted navigation ?  ?SURGEON:  Marciano Sequin. M.D. ?  ?ASSISTANT: Cassell Smiles, PA-C (present and scrubbed throughout the case, critical for assistance with exposure, retraction, instrumentation, and closure) ?  ?ANESTHESIA: spinal ?  ?ESTIMATED BLOOD LOSS: 50 mL ?  ?FLUIDS REPLACED: 700 mL of crystalloid ?  ?TOURNIQUET TIME: 87 minutes ?  ?DRAINS: 2 medium Hemovac drains ?  ?SOFT TISSUE RELEASES: Anterior cruciate ligament, posterior cruciate ligament, deep medial collateral ligament, patellofemoral ligament ?  ?IMPLANTS UTILIZED: DePuy Attune size 5N posterior stabilized femoral component (cemented), size 3 rotating platform tibial component (cemented), 32 mm medialized dome patella (cemented), and an 8 mm stabilized rotating platform polyethylene insert. ? ?Discharge Diagnoses: ?Patient Active Problem List  ? Diagnosis Date Noted  ? Hypertension 08/30/2021  ? 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 Cyst of right kidney 11/22/2020  ? Aortic atherosclerosis ([place]) 11/22/2020  ? CAD (coronary artery disease) 11/22/2020  ? Adrenal adenoma, left 11/22/2020  ? Arthritis of lumbar spine 11/22/2020  ? Primary osteoarthritis of both knees 09/30/2020  ? DDD (degenerative disc disease), lumbar 04/05/2020  ? Pain of right heel 04/05/2020  ? Hematuria 04/05/2020  ? Lumbar radiculopathy 04/05/2020  ? Premature atrial contraction 09/20/2019  ? Atrial fibrillation ([place]) 09/08/2019  ? Basal cell carcinoma 06/20/2019  ? Osteopenia 12/28/2018  ? Microcytosis 09/15/2018  ? Osteoarthritis 06/16/2018  ? Right shoulder pain 06/11/2018  ? Knee pain, bilateral 06/11/2018  ? Memory loss 06/11/2018  ? ? ?Past Medical  History:  ?Diagnosis Date  ? A-fib ([place])   ? a.) CHA2DS2-VASc = 5 (age x 2, sex, HTN, aortic plaque). b.) s/p DCCV (200J x 2) 11/07/2020. c.) rate/rhythm maintained on oral amiodarone; chronically anticoagulated using rivaroxaban.  ? Adrenal adenoma, left   ? Anemia   ? Aortic atherosclerosis ([place])   ? Arthritis   ? knees, right shoulder   ? B12 deficiency   ? Basal cell carcinoma   ? CAD (coronary artery disease)   ? Cyst of right kidney   ? DDD (degenerative disc disease), lumbar   ? History of chicken pox   ? History of kidney stones   ? HTN (hypertension)   ? Long term current use of anticoagulant   ? a.) rivaroxaban  ? Memory loss   ? Microcytosis   ? Osteopenia   ? PAC (premature atrial contraction)   ? Thyromegaly   ? ?  ?Transfusion:  ?  ?Consultants (if any):  ? ?Discharged Condition: Improved ? ?Hospital Course: Kelli Smith is an 81 y.o. female who was admitted 10/28/2021 with a diagnosis of right knee osteoarthritis and went to the operating room on 10/28/2021 and underwent right total knee arthroplasty. The patient received perioperative antibiotics for prophylaxis (see below). The patient tolerated the procedure well and was transported to PACU in stable condition. After meeting PACU criteria, the patient was subsequently transferred to the Orthopaedics/Rehabilitation unit.  ? ?The patient received DVT prophylaxis in the form of early mobilization, Xarelto, TED hose, and SCDs . A sacral pad had been placed and heels were elevated off of the bed with rolled towels in order to protect skin integrity. Foley catheter was discontinued on postoperative day #0. Wound drains were discontinued on postoperative day #1. The surgical incision  was healing well without signs of infection. ? ?Physical therapy was initiated postoperatively for transfers, gait training, and strengthening. Occupational therapy was initiated for activities of daily living and evaluation for assisted devices. Rehabilitation goals were  reviewed in detail with the patient. The patient made steady progress with physical therapy and physical therapy recommended discharge to Home.  ? ?The patient achieved the preliminary goals of this hospitalization and was felt to be medically and orthopaedically appropriate for discharge. ? ?She was given perioperative antibiotics:  ?Anti-infectives (From admission, onward)  ? ? Start     Dose/Rate Route Frequency Ordered Stop  ? 10/28/21 2015  ceFAZolin (ANCEF) IVPB 2g/100 mL premix       ? 2 g ?200 mL/hr over 30 Minutes Intravenous Every 6 hours 10/28/21 1927 10/29/21 0154  ? 10/28/21 0932  ceFAZolin (ANCEF) 2-4 GM/100ML-% IVPB       ?Note to Pharmacy: Olena Mater F: cabinet override  ?    10/28/21 0932 10/28/21 1136  ? 10/28/21 0600  ceFAZolin (ANCEF) IVPB 2g/100 mL premix       ? 2 g ?200 mL/hr over 30 Minutes Intravenous On call to O.R. 10/27/21 2207 10/28/21 1135  ? ?  ?. ? ?Recent vital signs:  ?Vitals:  ? 10/30/21 0503 10/30/21 0753  ?BP: (!) 152/58 (!) 149/68  ?Pulse: 72 85  ?Resp: 17 16  ?Temp: 98 ?F (36.7 ?C) 98.2 ?F (36.8 ?C)  ?SpO2: 98% 99%  ? ? ?Recent laboratory studies:  ?No results for input(s): WBC, HGB, HCT, PLT, K, CL, CO2, BUN, CREATININE, GLUCOSE, CALCIUM, LABPT, INR in the last 72 hours. ? ?Diagnostic Studies: DG Knee Right Port ? ?Result Date: 10/28/2021 ?CLINICAL DATA:  Status postop EXAM: PORTABLE RIGHT KNEE - 1-2 VIEW COMPARISON:  None. FINDINGS: There is a right total knee arthroplasty in normal alignment without evidence of loosening or periprosthetic fracture. Expected soft tissue changes. There is a drain in place. IMPRESSION: Right total knee arthroplasty. No evidence of immediate hardware complication. Electronically Signed   By: Maurine Simmering M.D.   On: 10/28/2021 15:15   ? ?Discharge Medications:   ?Allergies as of 10/30/2021   ?No Known Allergies ?  ? ?  ?Medication List  ?  ? ?TAKE these medications   ? ?acetaminophen 650 MG CR tablet ?Commonly known as: TYLENOL ?Take 1,300 mg by  mouth every 8 (eight) hours as needed for pain. ?  ?amiodarone 200 MG tablet ?Commonly known as: Pacerone ?Take 1 tablet (200 mg total) by mouth every morning. ?  ?B Complex-C-Folic Acid Tabs ?Take 1 tablet by mouth daily. ?  ?CALCIUM 600+D3 PLUS MINERALS PO ?Take 1,200 mg by mouth daily. ?  ?celecoxib 200 MG capsule ?Commonly known as: CELEBREX ?Take 1 capsule (200 mg total) by mouth 2 (two) times daily. ?  ?cholecalciferol 25 MCG (1000 UNIT) tablet ?Commonly known as: VITAMIN D3 ?Take 1,000 Units by mouth daily. ?  ?donepezil 5 MG tablet ?Commonly known as: ARICEPT ?Take 1 tablet (5 mg total) by mouth at bedtime. ?  ?Fish Oil 1200 MG Cpdr ?Take 1,200 mg by mouth daily. ?  ?furosemide 20 MG tablet ?Commonly known as: LASIX ?Take 1 tablet (20 mg total) by mouth daily as needed. For swelling and shortness of breath. ?What changed:  ?reasons to take this ?additional instructions ?  ?Iron 325 (65 Fe) MG Tabs ?Take 325 mg by mouth daily. ?  ?multivitamin with minerals tablet ?Take 1 tablet by mouth daily. ?  ?oxyCODONE 5 MG immediate  release tablet ?Commonly known as: Oxy IR/ROXICODONE ?Take 1 tablet (5 mg total) by mouth every 4 (four) hours as needed for severe pain. ?  ?rivaroxaban 20 MG Tabs tablet ?Commonly known as: XARELTO ?Take 1 tablet (20 mg total) by mouth daily with supper. ?  ?traMADol 50 MG tablet ?Commonly known as: ULTRAM ?Take 1 tablet (50 mg total) by mouth at bedtime as needed for moderate pain. ?What changed: reasons to take this ?  ?traMADol 50 MG tablet ?Commonly known as: ULTRAM ?Take 1 tablet (50 mg total) by mouth every 4 (four) hours as needed for moderate pain. ?What changed: You were already taking a medication with the same name, and this prescription was added. Make sure you understand how and when to take each. ?  ?vitamin B-12 100 MCG tablet ?Commonly known as: CYANOCOBALAMIN ?Take 100 mcg by mouth daily. ?  ? ?  ? ?  ?  ? ? ?  ?Durable Medical Equipment  ?(From admission, onward)  ?   ? ? ?  ? ?  Start     Ordered  ? 10/28/21 1928  DME Walker rolling  Once       ?Question:  Patient needs a walker to treat with the following condition  Answer:  Total knee replacement status  ? 10/28/21 1927

## 2021-10-30 DIAGNOSIS — Z96652 Presence of left artificial knee joint: Secondary | ICD-10-CM | POA: Diagnosis not present

## 2021-10-30 DIAGNOSIS — I4891 Unspecified atrial fibrillation: Secondary | ICD-10-CM | POA: Diagnosis not present

## 2021-10-30 DIAGNOSIS — M1711 Unilateral primary osteoarthritis, right knee: Secondary | ICD-10-CM | POA: Diagnosis not present

## 2021-10-30 MED ORDER — OXYCODONE HCL 5 MG PO TABS: 5.0000 mg | ORAL_TABLET | ORAL | 0 refills | Status: DC | PRN

## 2021-10-30 MED ORDER — CELECOXIB 200 MG PO CAPS
200.0000 mg | ORAL_CAPSULE | Freq: Two times a day (BID) | ORAL | 0 refills | Status: DC
Start: 2021-10-30 — End: 2022-07-01

## 2021-10-30 MED ORDER — AMIODARONE HCL 200 MG PO TABS: 200.0000 mg | ORAL_TABLET | ORAL | 0 refills | Status: DC

## 2021-10-30 MED ORDER — TRAMADOL HCL 50 MG PO TABS: 50.0000 mg | ORAL_TABLET | ORAL | 0 refills | Status: DC | PRN

## 2021-10-30 NOTE — Progress Notes (Addendum)
?  Subjective: ?2 Days Post-Op Procedure(s) (LRB): ?COMPUTER ASSISTED TOTAL KNEE ARTHROPLASTY (Right) ?Patient reports pain as well-controlled.   ?Patient is well, and has had no acute complaints or problems ?Plan is to go Home after hospital stay. ?Negative for chest pain and shortness of breath ?Fever: no ?Gastrointestinal: negative for nausea and vomiting.  Patient has had a bowel movement. ? ?Objective: ?Vital signs in last 24 hours: ?Temp:  [97.9 ?F (36.6 ?C)-98.2 ?F (36.8 ?C)] 98.2 ?F (36.8 ?C) (04/05 0753) ?Pulse Rate:  [65-96] 85 (04/05 0753) ?Resp:  [16-18] 16 (04/05 0753) ?BP: (119-152)/(58-70) 149/68 (04/05 0753) ?SpO2:  [83 %-100 %] 99 % (04/05 0753) ? ?Intake/Output from previous day: ? ?Intake/Output Summary (Last 24 hours) at 10/30/2021 0809 ?Last data filed at 10/29/2021 1006 ?Gross per 24 hour  ?Intake --  ?Output 110 ml  ?Net -110 ml  ?  ?Intake/Output this shift: ?No intake/output data recorded. ? ?Labs: ?No results for input(s): HGB in the last 72 hours. ?No results for input(s): WBC, RBC, HCT, PLT in the last 72 hours. ?No results for input(s): NA, K, CL, CO2, BUN, CREATININE, GLUCOSE, CALCIUM in the last 72 hours. ?No results for input(s): LABPT, INR in the last 72 hours. ? ? ?EXAM ?General - Patient is Alert, Appropriate, and Oriented ?Extremity - Neurovascular intact ?Dorsiflexion/Plantar flexion intact ?Compartment soft ?Dressing/Incision -clean, dry, mild sanguinous drainage at midpoint of incision ?Motor Function - intact, moving foot and toes well on exam. Able to perform independent SLR.  ?Cardiovascular- Regular rate and rhythm, no murmurs/rubs/gallops ?Respiratory- Lungs clear to auscultation bilaterally ?Gastrointestinal- soft, nontender, and active bowel sounds ? ? ?Assessment/Plan: ?2 Days Post-Op Procedure(s) (LRB): ?COMPUTER ASSISTED TOTAL KNEE ARTHROPLASTY (Right) ?Principal Problem: ?  Total knee replacement status ? ?Estimated body mass index is 21.61 kg/m? as calculated from the  following: ?  Height as of this encounter: '5\' 3"'$  (1.6 m). ?  Weight as of this encounter: 55.3 kg. ?Advance diet ?Up with therapy ? ?Discharge this PM pending completion of therapy goals  ? ?  ? ?DVT Prophylaxis - Xarelto, Ted hose, and SCDs ?Weight-Bearing as tolerated to right leg ? ?Cassell Smiles, PA-C ?[facility] ?10/30/2021, 8:09 AM ? ?

## 2021-10-30 NOTE — Plan of Care (Signed)
?  Problem: Education: ?Goal: Knowledge of General Education information will improve ?Description: Including pain rating scale, medication(s)/side effects and non-pharmacologic comfort measures ?Outcome: Progressing ?  ?Problem: Health Behavior/Discharge Planning: ?Goal: Ability to manage health-related needs will improve ?Outcome: Progressing ?  ?Problem: Clinical Measurements: ?Goal: Ability to maintain clinical measurements within normal limits will improve ?Outcome: Progressing ?Goal: Will remain free from infection ?Outcome: Progressing ?Goal: Cardiovascular complication will be avoided ?Outcome: Progressing ?  ?Problem: Safety: ?Goal: Ability to remain free from injury will improve ?Outcome: Progressing ?  ?Problem: Skin Integrity: ?Goal: Risk for impaired skin integrity will decrease ?Outcome: Progressing ?  ?Problem: Activity: ?Goal: Risk for activity intolerance will decrease ?Outcome: Adequate for Discharge ?  ?Problem: Nutrition: ?Goal: Adequate nutrition will be maintained ?Outcome: Adequate for Discharge ?  ?Problem: Elimination: ?Goal: Will not experience complications related to bowel motility ?Outcome: Adequate for Discharge ?Goal: Will not experience complications related to urinary retention ?Outcome: Adequate for Discharge ?  ?Problem: Pain Managment: ?Goal: General experience of comfort will improve ?Outcome: Adequate for Discharge ?  ?

## 2021-10-30 NOTE — Progress Notes (Addendum)
1111 ?Pt has cleared PT and met all needs from CM ? ?1158 ?Avs reviewed with pt and daughter. All questions and concerns answered IV removed. Pt wants to eat lunch first and then will be ready for d/c via wheelchair  ?

## 2021-10-30 NOTE — Progress Notes (Signed)
Physical Therapy Treatment ?Patient Details ?Name: Kelli Smith ?MRN: [id number] ?DOB: [birth year] ?Today's Date: 10/30/2021 ? ? ?History of Present Illness Pt admitted for R TKR 10/29/19; PMH significant for a fib, DDD, lumbar radiculaopathy, L TKR, memory loss, osteoarthritis, osteopenia ? ?  ?PT Comments  ? ? Pt received up in recliner, family present. Education provided on TKA exercise packet, proper positioning, car transfers, and use of gait belt. Family and pt with good understanding. Pt seen this am for stair training with good technique and safety awareness up/down 4 steps with bilateral rails and CG/Supervision.  Pt appears ready to return home, family in agreement. Continue with HHPT/per MD recommendations. NO DME needed. ?  ?Recommendations for follow up therapy are one component of a multi-disciplinary discharge planning process, led by the attending physician.  Recommendations may be updated based on patient status, additional functional criteria and insurance authorization. ? ?Follow Up Recommendations ? Home health PT ?  ?  ?Assistance Recommended at Discharge Intermittent Supervision/Assistance  ?Patient can return home with the following A little help with walking and/or transfers;A little help with bathing/dressing/bathroom ?  ?Equipment Recommendations ? None recommended by PT  ?  ?Recommendations for Other Services   ? ? ?  ?Precautions / Restrictions Precautions ?Precautions: Knee ?Precaution Booklet Issued: Yes (comment) (Reviewed with pt and family with good understanding) ?Restrictions ?Weight Bearing Restrictions: Yes ?RLE Weight Bearing: Weight bearing as tolerated  ?  ? ?Mobility ? Bed Mobility ?  ?  ?  ?  ?  ?  ?  ?General bed mobility comments: pt received in recliner, returned to recliner. ?  ? ?Transfers ?Overall transfer level: Needs assistance ?Equipment used: Rolling walker (2 wheels) ?Transfers: Sit to/from Stand ?Sit to Stand: Min guard, Supervision ?  ?  ?  ?  ?  ?General transfer  comment:  (good demonstration of technique) ?  ? ?Ambulation/Gait ?Ambulation/Gait assistance: Min guard, Supervision ?Gait Distance (Feet):  (100) ?Assistive device: Rolling walker (2 wheels) ?Gait Pattern/deviations: Step-through pattern ?Gait velocity: decreased ?  ?  ?General Gait Details:  (No knee buckling noted) ? ? ?Stairs ?Stairs: Yes ?Stairs assistance: Min guard, Supervision ?Stair Management: Two rails, Forwards, Step to pattern ?Number of Stairs: 4 ?General stair comments:  (Great demonstration of technique and safety awareness) ? ? ?Wheelchair Mobility ?  ? ?Modified Rankin (Stroke Patients Only) ?  ? ? ?  ?Balance   ?  ?  ?  ?  ?  ?  ?  ?  ?  ?  ?  ?  ?  ?  ?  ?  ?  ?  ?  ? ?  ?Cognition Arousal/Alertness: Awake/alert ?Behavior During Therapy: [facility] for tasks assessed/performed ?Overall Cognitive Status: History of cognitive impairments - at baseline ?  ?  ?  ?  ?  ?  ?  ?  ?  ?  ?  ?  ?  ?  ?  ?  ?General Comments: pt daugther reports baseline memory deficits; pt is oriented to self, place, situation; not oriented to date; Good one step direction following and noted fair-good safety during ADL tasks with supervision ?  ?  ? ?  ?Exercises Total Joint Exercises ?Ankle Circles/Pumps: AROM, Both, 10 reps ?Long Arc Quad: AROM, Right, 10 reps ?Goniometric ROM: 0-110 AROM R knee in sitting ? ?  ?General Comments General comments (skin integrity, edema, etc.):  (Pt given TKA packet and educated on positioning, exercises, and car transfers with good understanding.) ?  ?  ? ?  Pertinent Vitals/Pain Pain Assessment ?Pain Assessment: 0-10 ?Pain Score: 2  ?Pain Location: R knee ?Pain Descriptors / Indicators: Aching ?Pain Intervention(s): Monitored during session  ? ? ?Home Living   ?  ?  ?  ?  ?  ?  ?  ?  ?  ?   ?  ?Prior Function    ?  ?  ?   ? ?PT Goals (current goals can now be found in the care plan section) Acute Rehab PT Goals ?Patient Stated Goal: to go home and get better. ?Progress towards PT goals:  Progressing toward goals ? ?  ?Frequency ? ? ? BID ? ? ? ?  ?PT Plan Current plan remains appropriate  ? ? ?Co-evaluation   ?  ?  ?  ?  ? ?  ?AM-PAC PT "6 Clicks" Mobility   ?Outcome Measure ? Help needed turning from your back to your side while in a flat bed without using bedrails?: A Little ?Help needed moving from lying on your back to sitting on the side of a flat bed without using bedrails?: A Little ?Help needed moving to and from a bed to a chair (including a wheelchair)?: A Little ?Help needed standing up from a chair using your arms (e.g., wheelchair or bedside chair)?: A Little ?Help needed to walk in hospital room?: A Little ?Help needed climbing 3-5 steps with a railing? : A Little ?6 Click Score: 18 ? ?  ?End of Session Equipment Utilized During Treatment: Gait belt ?Activity Tolerance: Patient tolerated treatment well ?Patient left: in chair;with call bell/phone within reach;with chair alarm set;with family/visitor present ?Nurse Communication: Mobility status ?PT Visit Diagnosis: Unsteadiness on feet (R26.81);Other abnormalities of gait and mobility (R26.89);Muscle weakness (generalized) (M62.81);Difficulty in walking, not elsewhere classified (R26.2) ?  ? ? ?Time: 9675-9163 ?PT Time Calculation (min) (ACUTE ONLY): 48 min ? ?Charges:  $Gait Training: 8-22 mins ?$Therapeutic Exercise: 8-22 mins ?$Therapeutic Activity: 8-22 mins          ?          ?Mikel Cella, PTA ? ? ? ?[name] ?10/30/2021, 10:24 AM ? ?

## 2021-10-31 ENCOUNTER — Encounter: Payer: Self-pay | Admitting: Orthopedic Surgery

## 2021-10-31 DIAGNOSIS — N281 Cyst of kidney, acquired: Secondary | ICD-10-CM | POA: Diagnosis not present

## 2021-10-31 DIAGNOSIS — Z791 Long term (current) use of non-steroidal anti-inflammatories (NSAID): Secondary | ICD-10-CM | POA: Diagnosis not present

## 2021-10-31 DIAGNOSIS — R413 Other amnesia: Secondary | ICD-10-CM | POA: Diagnosis not present

## 2021-10-31 DIAGNOSIS — M19011 Primary osteoarthritis, right shoulder: Secondary | ICD-10-CM | POA: Diagnosis not present

## 2021-10-31 DIAGNOSIS — I4891 Unspecified atrial fibrillation: Secondary | ICD-10-CM | POA: Diagnosis not present

## 2021-10-31 DIAGNOSIS — M8580 Other specified disorders of bone density and structure, unspecified site: Secondary | ICD-10-CM | POA: Diagnosis not present

## 2021-10-31 DIAGNOSIS — E538 Deficiency of other specified B group vitamins: Secondary | ICD-10-CM | POA: Diagnosis not present

## 2021-10-31 DIAGNOSIS — I2510 Atherosclerotic heart disease of native coronary artery without angina pectoris: Secondary | ICD-10-CM | POA: Diagnosis not present

## 2021-10-31 DIAGNOSIS — I491 Atrial premature depolarization: Secondary | ICD-10-CM | POA: Diagnosis not present

## 2021-10-31 DIAGNOSIS — M5116 Intervertebral disc disorders with radiculopathy, lumbar region: Secondary | ICD-10-CM | POA: Diagnosis not present

## 2021-10-31 DIAGNOSIS — I700 Atherosclerosis of aorta: Secondary | ICD-10-CM | POA: Diagnosis not present

## 2021-10-31 DIAGNOSIS — Z96653 Presence of artificial knee joint, bilateral: Secondary | ICD-10-CM | POA: Diagnosis not present

## 2021-10-31 DIAGNOSIS — Z471 Aftercare following joint replacement surgery: Secondary | ICD-10-CM | POA: Diagnosis not present

## 2021-10-31 DIAGNOSIS — Z85828 Personal history of other malignant neoplasm of skin: Secondary | ICD-10-CM | POA: Diagnosis not present

## 2021-10-31 DIAGNOSIS — D649 Anemia, unspecified: Secondary | ICD-10-CM | POA: Diagnosis not present

## 2021-10-31 DIAGNOSIS — I10 Essential (primary) hypertension: Secondary | ICD-10-CM | POA: Diagnosis not present

## 2021-10-31 DIAGNOSIS — Z7901 Long term (current) use of anticoagulants: Secondary | ICD-10-CM | POA: Diagnosis not present

## 2021-10-31 DIAGNOSIS — E010 Iodine-deficiency related diffuse (endemic) goiter: Secondary | ICD-10-CM | POA: Diagnosis not present

## 2021-11-01 DIAGNOSIS — D649 Anemia, unspecified: Secondary | ICD-10-CM | POA: Diagnosis not present

## 2021-11-01 DIAGNOSIS — M5116 Intervertebral disc disorders with radiculopathy, lumbar region: Secondary | ICD-10-CM | POA: Diagnosis not present

## 2021-11-01 DIAGNOSIS — I4891 Unspecified atrial fibrillation: Secondary | ICD-10-CM | POA: Diagnosis not present

## 2021-11-01 DIAGNOSIS — Z471 Aftercare following joint replacement surgery: Secondary | ICD-10-CM | POA: Diagnosis not present

## 2021-11-01 DIAGNOSIS — I10 Essential (primary) hypertension: Secondary | ICD-10-CM | POA: Diagnosis not present

## 2021-11-01 DIAGNOSIS — I2510 Atherosclerotic heart disease of native coronary artery without angina pectoris: Secondary | ICD-10-CM | POA: Diagnosis not present

## 2021-11-03 DIAGNOSIS — M5116 Intervertebral disc disorders with radiculopathy, lumbar region: Secondary | ICD-10-CM | POA: Diagnosis not present

## 2021-11-03 DIAGNOSIS — I10 Essential (primary) hypertension: Secondary | ICD-10-CM | POA: Diagnosis not present

## 2021-11-03 DIAGNOSIS — I2510 Atherosclerotic heart disease of native coronary artery without angina pectoris: Secondary | ICD-10-CM | POA: Diagnosis not present

## 2021-11-03 DIAGNOSIS — Z471 Aftercare following joint replacement surgery: Secondary | ICD-10-CM | POA: Diagnosis not present

## 2021-11-03 DIAGNOSIS — D649 Anemia, unspecified: Secondary | ICD-10-CM | POA: Diagnosis not present

## 2021-11-03 DIAGNOSIS — I4891 Unspecified atrial fibrillation: Secondary | ICD-10-CM | POA: Diagnosis not present

## 2021-11-05 DIAGNOSIS — D649 Anemia, unspecified: Secondary | ICD-10-CM | POA: Diagnosis not present

## 2021-11-05 DIAGNOSIS — Z471 Aftercare following joint replacement surgery: Secondary | ICD-10-CM | POA: Diagnosis not present

## 2021-11-05 DIAGNOSIS — I4891 Unspecified atrial fibrillation: Secondary | ICD-10-CM | POA: Diagnosis not present

## 2021-11-05 DIAGNOSIS — I10 Essential (primary) hypertension: Secondary | ICD-10-CM | POA: Diagnosis not present

## 2021-11-05 DIAGNOSIS — M5116 Intervertebral disc disorders with radiculopathy, lumbar region: Secondary | ICD-10-CM | POA: Diagnosis not present

## 2021-11-05 DIAGNOSIS — I2510 Atherosclerotic heart disease of native coronary artery without angina pectoris: Secondary | ICD-10-CM | POA: Diagnosis not present

## 2021-11-07 DIAGNOSIS — Z471 Aftercare following joint replacement surgery: Secondary | ICD-10-CM | POA: Diagnosis not present

## 2021-11-07 DIAGNOSIS — I10 Essential (primary) hypertension: Secondary | ICD-10-CM | POA: Diagnosis not present

## 2021-11-07 DIAGNOSIS — I2510 Atherosclerotic heart disease of native coronary artery without angina pectoris: Secondary | ICD-10-CM | POA: Diagnosis not present

## 2021-11-07 DIAGNOSIS — D649 Anemia, unspecified: Secondary | ICD-10-CM | POA: Diagnosis not present

## 2021-11-07 DIAGNOSIS — I4891 Unspecified atrial fibrillation: Secondary | ICD-10-CM | POA: Diagnosis not present

## 2021-11-07 DIAGNOSIS — M5116 Intervertebral disc disorders with radiculopathy, lumbar region: Secondary | ICD-10-CM | POA: Diagnosis not present

## 2021-11-11 DIAGNOSIS — D649 Anemia, unspecified: Secondary | ICD-10-CM | POA: Diagnosis not present

## 2021-11-11 DIAGNOSIS — M5116 Intervertebral disc disorders with radiculopathy, lumbar region: Secondary | ICD-10-CM | POA: Diagnosis not present

## 2021-11-11 DIAGNOSIS — I4891 Unspecified atrial fibrillation: Secondary | ICD-10-CM | POA: Diagnosis not present

## 2021-11-11 DIAGNOSIS — Z471 Aftercare following joint replacement surgery: Secondary | ICD-10-CM | POA: Diagnosis not present

## 2021-11-11 DIAGNOSIS — I10 Essential (primary) hypertension: Secondary | ICD-10-CM | POA: Diagnosis not present

## 2021-11-11 DIAGNOSIS — I2510 Atherosclerotic heart disease of native coronary artery without angina pectoris: Secondary | ICD-10-CM | POA: Diagnosis not present

## 2021-11-12 DIAGNOSIS — Z96651 Presence of right artificial knee joint: Secondary | ICD-10-CM | POA: Diagnosis not present

## 2021-11-14 DIAGNOSIS — Z96651 Presence of right artificial knee joint: Secondary | ICD-10-CM | POA: Diagnosis not present

## 2021-11-18 ENCOUNTER — Encounter: Payer: Self-pay | Admitting: Cardiology

## 2021-11-18 ENCOUNTER — Ambulatory Visit (INDEPENDENT_AMBULATORY_CARE_PROVIDER_SITE_OTHER): Payer: Medicare Other | Admitting: Cardiology

## 2021-11-18 VITALS — BP 132/60 | HR 67 | Wt 130.0 lb

## 2021-11-18 DIAGNOSIS — R946 Abnormal results of thyroid function studies: Secondary | ICD-10-CM | POA: Diagnosis not present

## 2021-11-18 DIAGNOSIS — I4892 Unspecified atrial flutter: Secondary | ICD-10-CM

## 2021-11-18 MED ORDER — METOPROLOL SUCCINATE ER 25 MG PO TB24: 25.0000 mg | ORAL_TABLET | Freq: Every day | ORAL | 5 refills | Status: DC

## 2021-11-18 NOTE — Progress Notes (Signed)
?Cardiology Office Note:   ? ?Date:  11/18/2021  ? ?ID:  Kelli Smith, DOB 01/20/1941, MRN [id number] ? ?PCP:  McLean-Scocuzza, Nino Glow, MD  ?Cardiologist:  Kate Sable, MD  ?Electrophysiologist:  None  ? ?Referring MD: McLean-Scocuzza, Olivia Mackie *  ? ?Chief Complaint  ?Patient presents with  ? OTher  ?  6 month follow up -- Meds reviewed verbally with patient.   ? ? ?History of Present Illness:   ? ?Kelli Smith is a 81 y.o. female with a hx of arthritis, paroxysmal A. fib/flutter s/p DCCV 11/07/2020 who presents for follow-up.   ? ?Being seen for atrial flutter.  Was previously on amiodarone, TSH obtained 4 weeks ago showing abnormal values.  Amiodarone was stopped, she recently underwent right knee surgery successfully, currently undergoing physical therapy.  Denies chest pain, denies palpitations.  Tolerating Xarelto with no bleeding issues. ? ? ?Prior notes ?Echocardiogram 12/6438 normal systolic function, mild LVH, EF 60 to 65%. ?Cardiac monitor on 09/2019 reviewed again showing paroxysmal atrial fibrillation. ? ? ?Past Medical History:  ?Diagnosis Date  ? A-fib ([place])   ? a.) CHA2DS2-VASc = 5 (age x 2, sex, HTN, aortic plaque). b.) s/p DCCV (200J x 2) 11/07/2020. c.) rate/rhythm maintained on oral amiodarone; chronically anticoagulated using rivaroxaban.  ? Adrenal adenoma, left   ? Anemia   ? Aortic atherosclerosis ([place])   ? Arthritis   ? knees, right shoulder   ? B12 deficiency   ? Basal cell carcinoma   ? CAD (coronary artery disease)   ? Cyst of right kidney   ? DDD (degenerative disc disease), lumbar   ? History of chicken pox   ? History of kidney stones   ? HTN (hypertension)   ? Long term current use of anticoagulant   ? a.) rivaroxaban  ? Memory loss   ? Microcytosis   ? Osteopenia   ? PAC (premature atrial contraction)   ? Thyromegaly   ? ? ?Past Surgical History:  ?Procedure Laterality Date  ? BUNIONECTOMY WITH HAMMERTOE RECONSTRUCTION  2009  ? CARDIOVERSION N/A 11/07/2020  ? Procedure:  CARDIOVERSION (200J x 2); Location: [place]; Surgeon: Kate Sable, MD  ? CATARACT EXTRACTION Bilateral   ? 2013/2014  ? [place]  ? KNEE ARTHROPLASTY Left 12/12/2020  ? Procedure: COMPUTER ASSISTED TOTAL KNEE ARTHROPLASTY;  Surgeon: Dereck Leep, MD;  Location: [facility];  Service: Orthopedics;  Laterality: Left;  ? KNEE ARTHROPLASTY Right 10/28/2021  ? Procedure: COMPUTER ASSISTED TOTAL KNEE ARTHROPLASTY;  Surgeon: Dereck Leep, MD;  Location: [facility];  Service: Orthopedics;  Laterality: Right;  ? TEE WITHOUT CARDIOVERSION N/A 11/07/2020  ? Procedure: TRANSESOPHAGEAL ECHOCARDIOGRAM (TEE);  Surgeon: Kate Sable, MD;  Location: [facility];  Service: Cardiovascular;  Laterality: N/A;  ? ? ?Current Medications: ?Current Meds  ?Medication Sig  ? acetaminophen (TYLENOL) 650 MG CR tablet Take 1,300 mg by mouth every 8 (eight) hours as needed for pain.  ? B Complex-C-Folic Acid TABS Take 1 tablet by mouth daily.  ? Calcium Carbonate-Vit D-Min (CALCIUM 600+D3 PLUS MINERALS PO) Take 1,200 mg by mouth daily.  ? celecoxib (CELEBREX) 200 MG capsule Take 1 capsule (200 mg total) by mouth 2 (two) times daily.  ? cholecalciferol (VITAMIN D3) 25 MCG (1000 UNIT) tablet Take 1,000 Units by mouth daily.  ? donepezil (ARICEPT) 5 MG tablet Take 1 tablet (5 mg total) by mouth at bedtime.  ? Ferrous Sulfate (IRON) 325 (65 Fe) MG TABS Take 325 mg by mouth daily.  ?  furosemide (LASIX) 20 MG tablet Take 1 tablet (20 mg total) by mouth daily as needed. For swelling and shortness of breath.  ? metoprolol succinate (TOPROL XL) 25 MG 24 hr tablet Take 1 tablet (25 mg total) by mouth daily.  ? Multiple Vitamins-Minerals (MULTIVITAMIN WITH MINERALS) tablet Take 1 tablet by mouth daily.  ? Omega-3 Fatty Acids (FISH OIL) 1200 MG CPDR Take 1,200 mg by mouth daily.  ? oxyCODONE (OXY IR/ROXICODONE) 5 MG immediate release tablet Take 1 tablet (5 mg total) by mouth every 4 (four) hours as needed for severe pain.  ?  rivaroxaban (XARELTO) 20 MG TABS tablet Take 1 tablet (20 mg total) by mouth daily with supper.  ? traMADol (ULTRAM) 50 MG tablet Take 1 tablet (50 mg total) by mouth at bedtime as needed for moderate pain.  ? traMADol (ULTRAM) 50 MG tablet Take 1 tablet (50 mg total) by mouth every 4 (four) hours as needed for moderate pain.  ? vitamin B-12 (CYANOCOBALAMIN) 100 MCG tablet Take 100 mcg by mouth daily.  ? [DISCONTINUED] amiodarone (PACERONE) 200 MG tablet Take 1 tablet (200 mg total) by mouth every morning.  ?  ? ?Allergies:   Patient has no known allergies.  ? ?Social History  ? ?Socioeconomic History  ? Marital status: Widowed  ?  Spouse name: Not on file  ? Number of children: Not on file  ? Years of education: Not on file  ? Highest education level: Not on file  ?Occupational History  ? Not on file  ?Tobacco Use  ? Smoking status: Never  ? Smokeless tobacco: Never  ?Vaping Use  ? Vaping Use: Never used  ?Substance and Sexual Activity  ? Alcohol use: Not Currently  ? Drug use: Never  ? Sexual activity: Not on file  ?Other Topics Concern  ? Not on file  ?Social History Narrative  ? From [place] lived in [place] since late 1990s early 2000   ? Lives with daughter   ? College ed   ? Former Pharmacist, hospital   ? No guns, wears seat belt, safe in relationship   ? Widowed   ?   ? 2 daughters 1/2 in [employer]   ? ?Social Determinants of Health  ? ?Financial Resource Strain: Low Risk   ? Difficulty of Paying Living Expenses: Not hard at all  ?Food Insecurity: No Food Insecurity  ? Worried About Charity fundraiser in the Last Year: Never true  ? Ran Out of Food in the Last Year: Never true  ?Transportation Needs: No Transportation Needs  ? Lack of Transportation (Medical): No  ? Lack of Transportation (Non-Medical): No  ?Physical Activity: Insufficiently Active  ? Days of Exercise per Week: 1 day  ? Minutes of Exercise per Session: 20 min  ?Stress: No Stress Concern Present  ? Feeling of Stress : Not at all  ?Social Connections:  Unknown  ? Frequency of Communication with Friends and Family: Not on file  ? Frequency of Social Gatherings with Friends and Family: More than three times a week  ? Attends Religious Services: Not on file  ? Active Member of Clubs or Organizations: Not on file  ? Attends Archivist Meetings: Not on file  ? Marital Status: Not on file  ?  ? ?Family History: ?The patient's family history includes Alzheimer's disease in her sister; Heart Problems in her mother; Heart disease in her father and mother. ? ?ROS:   ?Please see the history of present illness.    ?  All other systems reviewed and are negative. ? ?EKGs/Labs/Other Studies Reviewed:   ? ?The following studies were reviewed today: ? ? ?EKG:  EKG is  ordered today.  The ekg ordered today demonstrates normal sinus rhythm, normal ECG. ? ?Recent Labs: ?10/14/2021: ALT 28; BUN 15; Creatinine, Ser 0.55; Hemoglobin 11.3; Platelets 256; Potassium 3.6; Sodium 139 ?10/21/2021: TSH <0.01  ?Recent Lipid Panel ?   ?Component Value Date/Time  ? CHOL 160 10/21/2021 0747  ? TRIG 69.0 10/21/2021 0747  ? HDL 74.10 10/21/2021 0747  ? CHOLHDL 2 10/21/2021 0747  ? VLDL 13.8 10/21/2021 0747  ? [place] 72 10/21/2021 0747  ? ? ?Physical Exam:   ? ?VS:  BP 132/60 (BP Location: Right Arm, Patient Position: Sitting, Cuff Size: Normal)   Pulse 67   Wt 130 lb (59 kg)   SpO2 94%   BMI 23.03 kg/m?    ? ?Wt Readings from Last 3 Encounters:  ?11/18/21 130 lb (59 kg)  ?10/28/21 122 lb (55.3 kg)  ?10/14/21 121 lb 4.1 oz (55 kg)  ?  ? ?GEN:  Well nourished, well developed in no acute distress ?HEENT: Normal ?NECK: No JVD; No carotid bruits ?CARDIAC: Regular rate and rhythm, no murmurs ?RESPIRATORY:  Clear to auscultation without rales, wheezing or rhonchi  ?ABDOMEN: Soft, non-tender, non-distended ?MUSCULOSKELETAL:  No edema; No deformity  ?SKIN: Warm and dry ?NEUROLOGIC:  Alert and oriented x 3 ?PSYCHIATRIC:  Normal affect  ? ?ASSESSMENT:   ? ?1. Atrial flutter, unspecified type ([place])    ?2. Abnormal thyroid function test   ? ?PLAN:   ? ?In order of problems listed above: ? ?Atrial flutter status post DC cardioversion 10/2020.  CHADS2 vascular score of 3 (age, gender).  Abnormal TSH, stop amiodarone.  Start Toprol

## 2021-11-18 NOTE — Patient Instructions (Signed)
Medication Instructions:  ? ?Your physician has recommended you make the following change in your medication:  ? ? STOP taking your Amiodarone. ? ?2.    START taking Metoprolol Succinate (Toprol XL) 25 MG once a day. ? ? ?*If you need a refill on your cardiac medications before your next appointment, please call your pharmacy* ? ? ?Lab Work: ? ?Your physician recommends that you return for lab work (TSH/FreeT4) in: IN 2 Months ? ?- Please go to the [facility] You will check in at the front desk to the right as you walk into the atrium. Valet Parking is offered if needed. ?- No appointment needed. You may go any day between 7 am and 6 pm.  ? ? ?Testing/Procedures: ?None ordered ? ? ?Follow-Up: ?At [facility], you and your health needs are our priority.  As part of our continuing mission to provide you with exceptional heart care, we have created designated Provider Care Teams.  These Care Teams include your primary Cardiologist (physician) and Advanced Practice Providers (APPs -  Physician Assistants and Nurse Practitioners) who all work together to provide you with the care you need, when you need it. ? ?We recommend signing up for the patient portal called "MyChart".  Sign up information is provided on this After Visit Summary.  MyChart is used to connect with patients for Virtual Visits (Telemedicine).  Patients are able to view lab/test results, encounter notes, upcoming appointments, etc.  Non-urgent messages can be sent to your provider as well.   ?To learn more about what you can do with MyChart, go to [email or web address].   ? ?Your next appointment:   ?3 month(s) ? ?The format for your next appointment:   ?In Person ? ?Provider:   ?You may see Kate Sable, MD or one of the following Advanced Practice Providers on your designated Care Team:   ?Murray Hodgkins, NP ?Christell Faith, PA-C ?Cadence Kathlen Mody, PA-C  ? ? ?Other Instructions ? ? ?Important Information About Sugar ? ? ? ? ?  ?

## 2021-11-20 DIAGNOSIS — Z96651 Presence of right artificial knee joint: Secondary | ICD-10-CM | POA: Diagnosis not present

## 2021-11-22 DIAGNOSIS — Z96651 Presence of right artificial knee joint: Secondary | ICD-10-CM | POA: Diagnosis not present

## 2021-11-24 NOTE — Progress Notes (Signed)
?Electrophysiology Office Note:   ? ?Date:  11/27/2021  ? ?ID:  Kelli Smith, DOB [birth year], MRN [id number] ? ?PCP:  McLean-Scocuzza, Kelli Glow, MD  ?[facility] Cardiologist:  Kelli Sable, MD  ?[facility] Electrophysiologist:  Vickie Epley, MD  ? ?Referring MD: Kelli Sable, MD  ? ?Chief Complaint: Atrial flutter ? ?History of Present Illness:   ? ?Kelli Smith is a 81 y.o. female who presents for an evaluation of atrial flutter at the request of Dr. Garen Smith. Their medical history includes arthritis, aortic atherosclerosis.  The patient was previously managed on amiodarone but this was stopped because of abnormal thyroid function.  The patient is maintained on Xarelto for stroke prophylaxis.  The patient last saw Dr. Garen Smith November 18, 2021. ? ?She is with her daughter today in clinic.  She is about 1 month postop from a knee replacement surgery.  That was her second knee replacement.  She is recovering well and attending PT sessions.  She is taking her Xarelto without bleeding issues.  She is also tolerating the metoprolol succinate.  She is in normal rhythm today.  She confirms that she could never tell that she was out of rhythm. ? ?  ?Past Medical History:  ?Diagnosis Date  ? A-fib ([place])   ? a.) CHA2DS2-VASc = 5 (age x 2, sex, HTN, aortic plaque). b.) s/p DCCV (200J x 2) 11/07/2020. c.) rate/rhythm maintained on oral amiodarone; chronically anticoagulated using rivaroxaban.  ? Adrenal adenoma, left   ? Anemia   ? Aortic atherosclerosis ([place])   ? Arthritis   ? knees, right shoulder   ? B12 deficiency   ? Basal cell carcinoma   ? CAD (coronary artery disease)   ? Cyst of right kidney   ? DDD (degenerative disc disease), lumbar   ? History of chicken pox   ? History of kidney stones   ? HTN (hypertension)   ? Long term current use of anticoagulant   ? a.) rivaroxaban  ? Memory loss   ? Microcytosis   ? Osteopenia   ? PAC (premature atrial contraction)   ? Thyromegaly   ? ? ?Past  Surgical History:  ?Procedure Laterality Date  ? BUNIONECTOMY WITH HAMMERTOE RECONSTRUCTION  2009  ? CARDIOVERSION N/A 11/07/2020  ? Procedure: CARDIOVERSION (200J x 2); Location: [place]; Surgeon: Kelli Sable, MD  ? CATARACT EXTRACTION Bilateral   ? 2013/2014  ? [place]  ? KNEE ARTHROPLASTY Left 12/12/2020  ? Procedure: COMPUTER ASSISTED TOTAL KNEE ARTHROPLASTY;  Surgeon: Kelli Leep, MD;  Location: [facility];  Service: Orthopedics;  Laterality: Left;  ? KNEE ARTHROPLASTY Right 10/28/2021  ? Procedure: COMPUTER ASSISTED TOTAL KNEE ARTHROPLASTY;  Surgeon: Kelli Leep, MD;  Location: [facility];  Service: Orthopedics;  Laterality: Right;  ? TEE WITHOUT CARDIOVERSION N/A 11/07/2020  ? Procedure: TRANSESOPHAGEAL ECHOCARDIOGRAM (TEE);  Surgeon: Kelli Sable, MD;  Location: [facility];  Service: Cardiovascular;  Laterality: N/A;  ? ? ?Current Medications: ?Current Meds  ?Medication Sig  ? acetaminophen (TYLENOL) 650 MG CR tablet Take 1,300 mg by mouth every 8 (eight) hours as needed for pain.  ? B Complex-C-Folic Acid TABS Take 1 tablet by mouth daily.  ? Calcium Carbonate-Vit D-Min (CALCIUM 600+D3 PLUS MINERALS PO) Take 1,200 mg by mouth daily.  ? celecoxib (CELEBREX) 200 MG capsule Take 1 capsule (200 mg total) by mouth 2 (two) times daily.  ? cholecalciferol (VITAMIN D3) 25 MCG (1000 UNIT) tablet Take 1,000 Units by mouth daily.  ? donepezil (  ARICEPT) 5 MG tablet Take 1 tablet (5 mg total) by mouth at bedtime.  ? Ferrous Sulfate (IRON) 325 (65 Fe) MG TABS Take 325 mg by mouth daily.  ? furosemide (LASIX) 20 MG tablet Take 1 tablet (20 mg total) by mouth daily as needed. For swelling and shortness of breath.  ? metoprolol succinate (TOPROL XL) 25 MG 24 hr tablet Take 1 tablet (25 mg total) by mouth daily.  ? Multiple Vitamins-Minerals (MULTIVITAMIN WITH MINERALS) tablet Take 1 tablet by mouth daily.  ? Omega-3 Fatty Acids (FISH OIL) 1200 MG CPDR Take 1,200 mg by mouth daily.  ? oxyCODONE  (OXY IR/ROXICODONE) 5 MG immediate release tablet Take 1 tablet (5 mg total) by mouth every 4 (four) hours as needed for severe pain.  ? rivaroxaban (XARELTO) 20 MG TABS tablet Take 1 tablet (20 mg total) by mouth daily with supper.  ? traMADol (ULTRAM) 50 MG tablet Take 1 tablet (50 mg total) by mouth at bedtime as needed for moderate pain.  ? traMADol (ULTRAM) 50 MG tablet Take 1 tablet (50 mg total) by mouth every 4 (four) hours as needed for moderate pain.  ? vitamin B-12 (CYANOCOBALAMIN) 100 MCG tablet Take 100 mcg by mouth daily.  ?  ? ?Allergies:   Patient has no known allergies.  ? ?Social History  ? ?Socioeconomic History  ? Marital status: Widowed  ?  Spouse name: Not on file  ? Number of children: Not on file  ? Years of education: Not on file  ? Highest education level: Not on file  ?Occupational History  ? Not on file  ?Tobacco Use  ? Smoking status: Never  ? Smokeless tobacco: Never  ?Vaping Use  ? Vaping Use: Never used  ?Substance and Sexual Activity  ? Alcohol use: Not Currently  ? Drug use: Never  ? Sexual activity: Not on file  ?Other Topics Concern  ? Not on file  ?Social History Narrative  ? From [place] lived in [place] since late 1990s early 2000   ? Lives with daughter   ? College ed   ? Former Pharmacist, hospital   ? No guns, wears seat belt, safe in relationship   ? Widowed   ?   ? 2 daughters 1/2 in [employer]   ? ?Social Determinants of Health  ? ?Financial Resource Strain: Low Risk   ? Difficulty of Paying Living Expenses: Not hard at all  ?Food Insecurity: No Food Insecurity  ? Worried About Charity fundraiser in the Last Year: Never true  ? Ran Out of Food in the Last Year: Never true  ?Transportation Needs: No Transportation Needs  ? Lack of Transportation (Medical): No  ? Lack of Transportation (Non-Medical): No  ?Physical Activity: Insufficiently Active  ? Days of Exercise per Week: 1 day  ? Minutes of Exercise per Session: 20 min  ?Stress: No Stress Concern Present  ? Feeling of Stress : Not at  all  ?Social Connections: Unknown  ? Frequency of Communication with Friends and Family: Not on file  ? Frequency of Social Gatherings with Friends and Family: More than three times a week  ? Attends Religious Services: Not on file  ? Active Member of Clubs or Organizations: Not on file  ? Attends Archivist Meetings: Not on file  ? Marital Status: Not on file  ?  ? ?Family History: ?The patient's family history includes Alzheimer's disease in her sister; Heart Problems in her mother; Heart disease in her father and  mother. ? ?ROS:   ?Please see the history of present illness.    ?All other systems reviewed and are negative. ? ?EKGs/Labs/Other Studies Reviewed:   ? ?The following studies were reviewed today: ? ?November 07, 2020 transesophageal echo ?Normal left ventricular function ?Moderately dilated left atrium ?No left atrial appendage thrombus ?Mild to moderate MR ? ?November 07, 2020 EKG shows atrial fibrillation with rapid ventricular rates ?November 18, 2021 EKG shows sinus rhythm ? ? ?EKG:  The ekg ordered today demonstrates sinus rhythm.  Normal intervals. ? ? ?Recent Labs: ?10/14/2021: ALT 28; BUN 15; Creatinine, Ser 0.55; Hemoglobin 11.3; Platelets 256; Potassium 3.6; Sodium 139 ?10/21/2021: TSH <0.01  ?Recent Lipid Panel ?   ?Component Value Date/Time  ? CHOL 160 10/21/2021 0747  ? TRIG 69.0 10/21/2021 0747  ? HDL 74.10 10/21/2021 0747  ? CHOLHDL 2 10/21/2021 0747  ? VLDL 13.8 10/21/2021 0747  ? [place] 72 10/21/2021 0747  ? ? ?Physical Exam:   ? ?VS:  BP (!) 142/68   Pulse 68   Ht '5\' 3"'$  (1.6 m)   Wt 127 lb (57.6 kg)   SpO2 99%   BMI 22.50 kg/m?    ? ?Wt Readings from Last 3 Encounters:  ?11/27/21 127 lb (57.6 kg)  ?11/18/21 130 lb (59 kg)  ?10/28/21 122 lb (55.3 kg)  ?  ? ?GEN:  Well nourished, well developed in no acute distress.  Using rollator today. ?HEENT: Normal ?NECK: No JVD; No carotid bruits ?LYMPHATICS: No lymphadenopathy ?CARDIAC: RRR, no murmurs, rubs, gallops ?RESPIRATORY:  Clear to  auscultation without rales, wheezing or rhonchi  ?ABDOMEN: Soft, non-tender, non-distended ?MUSCULOSKELETAL:  No edema; No deformity  ?SKIN: Warm and dry ?NEUROLOGIC:  Alert and oriented x 3 ?PSYCHIATRIC:  Normal affe

## 2021-11-26 DIAGNOSIS — Z96651 Presence of right artificial knee joint: Secondary | ICD-10-CM | POA: Diagnosis not present

## 2021-11-27 ENCOUNTER — Encounter: Payer: Self-pay | Admitting: Cardiology

## 2021-11-27 ENCOUNTER — Ambulatory Visit (INDEPENDENT_AMBULATORY_CARE_PROVIDER_SITE_OTHER): Payer: Medicare Other | Admitting: Cardiology

## 2021-11-27 VITALS — BP 142/68 | HR 68 | Ht 63.0 in | Wt 127.0 lb

## 2021-11-27 DIAGNOSIS — I700 Atherosclerosis of aorta: Secondary | ICD-10-CM | POA: Diagnosis not present

## 2021-11-27 DIAGNOSIS — I4819 Other persistent atrial fibrillation: Secondary | ICD-10-CM

## 2021-11-27 DIAGNOSIS — I4892 Unspecified atrial flutter: Secondary | ICD-10-CM | POA: Diagnosis not present

## 2021-11-27 DIAGNOSIS — I10 Essential (primary) hypertension: Secondary | ICD-10-CM

## 2021-11-27 NOTE — Patient Instructions (Addendum)
Medications: ?Your physician recommends that you continue on your current medications as directed. Please refer to the Current Medication list given to you today. ?*If you need a refill on your cardiac medications before your next appointment, please call your pharmacy* ? ?Lab Work: ?None. ?If you have labs (blood work) drawn today and your tests are completely normal, you will receive your results only by: ?MyChart Message (if you have MyChart) OR ?A paper copy in the mail ?If you have any lab test that is abnormal or we need to change your treatment, we will call you to review the results. ? ?Testing/Procedures: ?None. ? ?Follow-Up: ?At [facility], you and your health needs are our priority.  As part of our continuing mission to provide you with exceptional heart care, we have created designated Provider Care Teams.  These Care Teams include your primary Cardiologist (physician) and Advanced Practice Providers (APPs -  Physician Assistants and Nurse Practitioners) who all work together to provide you with the care you need, when you need it. ? ?Your physician wants you to follow-up in: As needed with Lars Mage, MD  ? ?We recommend signing up for the patient portal called "MyChart".  Sign up information is provided on this After Visit Summary.  MyChart is used to connect with patients for Virtual Visits (Telemedicine).  Patients are able to view lab/test results, encounter notes, upcoming appointments, etc.  Non-urgent messages can be sent to your provider as well.   ?To learn more about what you can do with MyChart, go to [email or web address].   ? ?Any Other Special Instructions Will Be Listed Below (If Applicable). ? ?

## 2021-11-28 ENCOUNTER — Ambulatory Visit (INDEPENDENT_AMBULATORY_CARE_PROVIDER_SITE_OTHER): Payer: Medicare Other | Admitting: Podiatry

## 2021-11-28 ENCOUNTER — Encounter: Payer: Self-pay | Admitting: Podiatry

## 2021-11-28 DIAGNOSIS — B351 Tinea unguium: Secondary | ICD-10-CM

## 2021-11-28 DIAGNOSIS — M79675 Pain in left toe(s): Secondary | ICD-10-CM | POA: Diagnosis not present

## 2021-11-28 DIAGNOSIS — M79674 Pain in right toe(s): Secondary | ICD-10-CM | POA: Diagnosis not present

## 2021-11-28 DIAGNOSIS — Z96651 Presence of right artificial knee joint: Secondary | ICD-10-CM | POA: Diagnosis not present

## 2021-11-28 NOTE — Progress Notes (Signed)
This patient returns to the office for evaluation and treatment of long thick painful nails .  This patient is unable to trim her own nails since the patient cannot reach her feet.  Patient says the nails are painful walking and wearing her shoes.  She returns for preventive foot care services. ? ?General Appearance  Alert, conversant and in no acute stress. ? ?Vascular  Dorsalis pedis and posterior tibial  pulses are palpable  bilaterally.  Capillary return is within normal limits  bilaterally. Temperature is within normal limits  bilaterally. ? ?Neurologic  Senn-Weinstein monofilament wire test within normal limits  bilaterally. Muscle power within normal limits bilaterally. ? ?Nails Thick disfigured discolored nails with subungual debris  from hallux to fifth toes bilaterally. No evidence of bacterial infection or drainage bilaterally.  Pincer nail left hallux. ? ?Orthopedic  No limitations of motion  feet .  No crepitus or effusions noted.  No bony pathology or digital deformities noted. ? ?Skin  normotropic skin with no porokeratosis noted bilaterally.  No signs of infections or ulcers noted. Asymptomatic pinch callus. ? ?Onychomycosis  Pain in toes right foot  Pain in toes left foot ? ?Debridement  of nails  1-5  B/L with a nail nipper.  Nails were then filed using a dremel tool with no incidents.  Patient was very responsive to drill.   RTC  12 weeks  ? ? ?Gardiner Barefoot DPM  ?

## 2021-12-03 DIAGNOSIS — M25561 Pain in right knee: Secondary | ICD-10-CM | POA: Diagnosis not present

## 2021-12-03 DIAGNOSIS — Z96651 Presence of right artificial knee joint: Secondary | ICD-10-CM | POA: Diagnosis not present

## 2021-12-05 DIAGNOSIS — D2262 Melanocytic nevi of left upper limb, including shoulder: Secondary | ICD-10-CM | POA: Diagnosis not present

## 2021-12-05 DIAGNOSIS — Z85828 Personal history of other malignant neoplasm of skin: Secondary | ICD-10-CM | POA: Diagnosis not present

## 2021-12-05 DIAGNOSIS — L821 Other seborrheic keratosis: Secondary | ICD-10-CM | POA: Diagnosis not present

## 2021-12-05 DIAGNOSIS — D2272 Melanocytic nevi of left lower limb, including hip: Secondary | ICD-10-CM | POA: Diagnosis not present

## 2021-12-05 DIAGNOSIS — Z96651 Presence of right artificial knee joint: Secondary | ICD-10-CM | POA: Diagnosis not present

## 2021-12-05 DIAGNOSIS — D2261 Melanocytic nevi of right upper limb, including shoulder: Secondary | ICD-10-CM | POA: Diagnosis not present

## 2021-12-05 DIAGNOSIS — D2271 Melanocytic nevi of right lower limb, including hip: Secondary | ICD-10-CM | POA: Diagnosis not present

## 2021-12-10 DIAGNOSIS — Z96653 Presence of artificial knee joint, bilateral: Secondary | ICD-10-CM | POA: Diagnosis not present

## 2021-12-10 DIAGNOSIS — Z96651 Presence of right artificial knee joint: Secondary | ICD-10-CM | POA: Diagnosis not present

## 2021-12-17 ENCOUNTER — Other Ambulatory Visit: Payer: Self-pay | Admitting: *Deleted

## 2021-12-17 ENCOUNTER — Telehealth: Payer: Self-pay

## 2021-12-17 DIAGNOSIS — I4819 Other persistent atrial fibrillation: Secondary | ICD-10-CM

## 2021-12-17 MED ORDER — RIVAROXABAN 20 MG PO TABS: 20.0000 mg | ORAL_TABLET | Freq: Every day | ORAL | 5 refills | Status: DC

## 2021-12-17 NOTE — Telephone Encounter (Signed)
Called and spoke with patients daughter. Reminded her to have patient come for her lab draw (TSH, Free T4) as discussed in telephone encounter from 10/28/21. I informed her that the lab orders have been entered in for the [facility] and that she could go anytime M-F, between the hours of 7am-6pm.  Patients daughter verbalized understanding and agreed with plan.

## 2021-12-17 NOTE — Telephone Encounter (Signed)
Prescription refill request for Xarelto received.  Indication: Atrial Flutter Last office visit: 11/27/21  Grayce Sessions MD Weight: 57.6kg Age: 81 Scr: 0.55 on 10/14/21 CrCl: 72.95  Based on above findings Xarelto '20mg'$  daily is the appropriate dose.  Refill approved.

## 2021-12-24 ENCOUNTER — Other Ambulatory Visit
Admission: RE | Admit: 2021-12-24 | Discharge: 2021-12-24 | Disposition: A | Discharge status: Home / Self Care | Payer: Medicare Other | Source: Ambulatory Visit | Attending: Cardiology | Admitting: Cardiology

## 2021-12-24 DIAGNOSIS — I4819 Other persistent atrial fibrillation: Secondary | ICD-10-CM | POA: Insufficient documentation

## 2021-12-24 LAB — TSH: TSH: <0.01 u[IU]/mL — ABNORMAL LOW (ref 0.350–4.500)

## 2021-12-24 LAB — T4, FREE: Free T4: 3.05 ng/dL — ABNORMAL HIGH (ref 0.61–1.12)

## 2021-12-26 ENCOUNTER — Telehealth: Payer: Self-pay

## 2021-12-26 DIAGNOSIS — I4819 Other persistent atrial fibrillation: Secondary | ICD-10-CM

## 2021-12-26 NOTE — Telephone Encounter (Signed)
-----   Message from Kate Sable, MD sent at 12/25/2021  6:06 PM EDT ----- Thyroid function still abnormal, continue to hold amiodarone as previously recommended.  Continue metoprolol and Xarelto as prescribed.  Repeat TSH and free T4 in 2 months prior to follow-up visit.  If thyroid function still abnormal at that point, we will consider endocrinology referral.

## 2021-12-26 NOTE — Telephone Encounter (Signed)
Called and spoke with patients daughter. Informed her of Dr. Thereasa Solo recommendations as noted below. Patients daughter verbalized understanding and agreed with plan.

## 2022-01-20 ENCOUNTER — Ambulatory Visit
Admission: RE | Admit: 2022-01-20 | Discharge: 2022-01-20 | Disposition: A | Discharge status: Home / Self Care | Payer: Medicare Other | Source: Ambulatory Visit | Attending: Internal Medicine | Admitting: Internal Medicine

## 2022-01-20 DIAGNOSIS — Z1231 Encounter for screening mammogram for malignant neoplasm of breast: Secondary | ICD-10-CM | POA: Diagnosis not present

## 2022-02-12 DIAGNOSIS — E042 Nontoxic multinodular goiter: Secondary | ICD-10-CM | POA: Diagnosis not present

## 2022-02-20 ENCOUNTER — Other Ambulatory Visit
Admission: RE | Admit: 2022-02-20 | Discharge: 2022-02-20 | Disposition: A | Discharge status: Home / Self Care | Payer: Medicare Other | Attending: Cardiology | Admitting: Cardiology

## 2022-02-20 DIAGNOSIS — I4819 Other persistent atrial fibrillation: Secondary | ICD-10-CM | POA: Diagnosis not present

## 2022-02-20 LAB — TSH: TSH: <0.01 u[IU]/mL — ABNORMAL LOW (ref 0.350–4.500)

## 2022-02-20 LAB — T4, FREE: Free T4: 2.09 ng/dL — ABNORMAL HIGH (ref 0.61–1.12)

## 2022-02-24 ENCOUNTER — Encounter: Payer: Self-pay | Admitting: Cardiology

## 2022-02-24 ENCOUNTER — Ambulatory Visit (INDEPENDENT_AMBULATORY_CARE_PROVIDER_SITE_OTHER): Payer: Medicare Other | Admitting: Cardiology

## 2022-02-24 VITALS — BP 150/68 | HR 57 | Ht 63.0 in | Wt 117.6 lb

## 2022-02-24 DIAGNOSIS — I4892 Unspecified atrial flutter: Secondary | ICD-10-CM

## 2022-02-24 DIAGNOSIS — R946 Abnormal results of thyroid function studies: Secondary | ICD-10-CM

## 2022-02-24 NOTE — Patient Instructions (Signed)
Medication Instructions:   Your physician recommends that you continue on your current medications as directed. Please refer to the Current Medication list given to you today.   *If you need a refill on your cardiac medications before your next appointment, please call your pharmacy*   Lab Work:  Your physician recommends that you return for lab work in: 3 months  TSH, Free T4  Nature conservation officer at [facility] 1st desk on the right to check in (REGISTRATION)  Lab hours: Monday- Friday (7:30 am- 5:30 pm)  If you have labs (blood work) drawn today and your tests are completely normal, you will receive your results only by: MyChart Message (if you have MyChart) OR A paper copy in the mail If you have any lab test that is abnormal or we need to change your treatment, we will call you to review the results.   Testing/Procedures: None ordered   Follow-Up: At [facility], you and your health needs are our priority.  As part of our continuing mission to provide you with exceptional heart care, we have created designated Provider Care Teams.  These Care Teams include your primary Cardiologist (physician) and Advanced Practice Providers (APPs -  Physician Assistants and Nurse Practitioners) who all work together to provide you with the care you need, when you need it.  We recommend signing up for the patient portal called "MyChart".  Sign up information is provided on this After Visit Summary.  MyChart is used to connect with patients for Virtual Visits (Telemedicine).  Patients are able to view lab/test results, encounter notes, upcoming appointments, etc.  Non-urgent messages can be sent to your provider as well.   To learn more about what you can do with MyChart, go to [email or web address].    Your next appointment:   6 month(s)  The format for your next appointment:   In Person  Provider:   You may see Kate Sable, MD or one of the following Advanced Practice Providers on  your designated Care Team:   Murray Hodgkins, NP Christell Faith, PA-C Cadence Kathlen Mody, [place]    Other Instructions N/A  Important Information About Sugar

## 2022-02-24 NOTE — Progress Notes (Signed)
Cardiology Office Note:    Date:  02/24/2022   ID:  Kelli Smith, DOB 09/22/1940, MRN [id number]  PCP:  McLean-Scocuzza, Nino Glow, MD  Cardiologist:  Kate Sable, MD  Electrophysiologist:  Vickie Epley, MD   Referring MD: McLean-Scocuzza, Olivia Mackie *   Chief Complaint  Patient presents with   Follow-up    3 month follow up. Patient states that she feels fine. Meds reviewed with patient.     History of Present Illness:    Kelli Smith is a 81 y.o. female with a hx of arthritis, paroxysmal A. fib/flutter s/p DCCV 11/07/2020 who presents for follow-up.    Being seen for atrial flutter and abnormal TSH.  Thyroid function noted to be abnormal in the context of amiodarone use.  Amiodarone was stopped, free T4 appears improved although still abnormal.  She feels well, denies palpitations, tolerating Toprol, Xarelto.  Denies any bleeding issues.  Has appointment with endocrinology, has a history of thyroid nodules.   Prior notes Echocardiogram 01/3427 normal systolic function, mild LVH, EF 60 to 65%. Cardiac monitor on 09/2019 reviewed again showing paroxysmal atrial fibrillation.   Past Medical History:  Diagnosis Date   A-fib [facility])    a.) CHA2DS2-VASc = 5 (age x 2, sex, HTN, aortic plaque). b.) s/p DCCV (200J x 2) 11/07/2020. c.) rate/rhythm maintained on oral amiodarone; chronically anticoagulated using rivaroxaban.   Adrenal adenoma, left    Anemia    Aortic atherosclerosis (HCC)    Arthritis    knees, right shoulder    B12 deficiency    Basal cell carcinoma    CAD (coronary artery disease)    Cyst of right kidney    DDD (degenerative disc disease), lumbar    History of chicken pox    History of kidney stones    HTN (hypertension)    Long term current use of anticoagulant    a.) rivaroxaban   Memory loss    Microcytosis    Osteopenia    PAC (premature atrial contraction)    Thyromegaly     Past Surgical History:  Procedure Laterality Date   BUNIONECTOMY  WITH HAMMERTOE RECONSTRUCTION  2009   CARDIOVERSION N/A 11/07/2020   Procedure: CARDIOVERSION (200J x 2); Location: [facility]; Surgeon: Kate Sable, MD   CATARACT EXTRACTION Bilateral    2013/2014   CESAREAN SECTION N/A 1978   KNEE ARTHROPLASTY Left 12/12/2020   Procedure: COMPUTER ASSISTED TOTAL KNEE ARTHROPLASTY;  Surgeon: Dereck Leep, MD;  Location: [facility] ORS;  Service: Orthopedics;  Laterality: Left;   KNEE ARTHROPLASTY Right 10/28/2021   Procedure: COMPUTER ASSISTED TOTAL KNEE ARTHROPLASTY;  Surgeon: Dereck Leep, MD;  Location: [facility] ORS;  Service: Orthopedics;  Laterality: Right;   TEE WITHOUT CARDIOVERSION N/A 11/07/2020   Procedure: TRANSESOPHAGEAL ECHOCARDIOGRAM (TEE);  Surgeon: Kate Sable, MD;  Location: [facility] ORS;  Service: Cardiovascular;  Laterality: N/A;    Current Medications: Current Meds  Medication Sig   acetaminophen (TYLENOL) 650 MG CR tablet Take 1,300 mg by mouth every 8 (eight) hours as needed for pain.   Ascorbic Acid (VITAMIN C PO) Take by mouth daily.   Calcium Carbonate-Vit D-Min (CALCIUM 600+D3 PLUS MINERALS PO) Take 1,200 mg by mouth daily.   donepezil (ARICEPT) 5 MG tablet Take 1 tablet (5 mg total) by mouth at bedtime.   Ferrous Sulfate (IRON) 325 (65 Fe) MG TABS Take 325 mg by mouth daily.   furosemide (LASIX) 20 MG tablet Take 1 tablet (20 mg total) by mouth daily as needed. For  swelling and shortness of breath.   metoprolol succinate (TOPROL XL) 25 MG 24 hr tablet Take 1 tablet (25 mg total) by mouth daily.   Multiple Vitamins-Minerals (MULTIVITAMIN WITH MINERALS) tablet Take 1 tablet by mouth daily.   oxyCODONE (OXY IR/ROXICODONE) 5 MG immediate release tablet Take 1 tablet (5 mg total) by mouth every 4 (four) hours as needed for severe pain.   rivaroxaban (XARELTO) 20 MG TABS tablet Take 1 tablet (20 mg total) by mouth daily with supper.   traMADol (ULTRAM) 50 MG tablet Take 1 tablet (50 mg total) by mouth at bedtime as needed for moderate  pain.   traMADol (ULTRAM) 50 MG tablet Take 1 tablet (50 mg total) by mouth every 4 (four) hours as needed for moderate pain.   vitamin B-12 (CYANOCOBALAMIN) 100 MCG tablet Take 100 mcg by mouth daily.     Allergies:   Patient has no known allergies.   Social History   Socioeconomic History   Marital status: Widowed    Spouse name: Not on file   Number of children: Not on file   Years of education: Not on file   Highest education level: Not on file  Occupational History   Not on file  Tobacco Use   Smoking status: Never   Smokeless tobacco: Never  Vaping Use   Vaping Use: Never used  Substance and Sexual Activity   Alcohol use: Not Currently   Drug use: Never   Sexual activity: Not on file  Other Topics Concern   Not on file  Social History Narrative   From [place] lived in [place] since late 1990s early 2000    Lives with daughter    Secretary/administrator ed    Former Pharmacist, hospital    No guns, wears seat belt, safe in relationship    Widowed       2 daughters 1/2 in [place] Determinants of Health   Financial Resource Strain: [place]  (06/04/2021)   Overall Financial Resource Strain (CARDIA)    Difficulty of Paying Living Expenses: Not hard at all  Food Insecurity: No Food Insecurity (06/04/2021)   Hunger Vital Sign    Worried About Running Out of Food in the Last Year: Never true    [place] in the Last Year: Never true  Transportation Needs: No Transportation Needs (06/04/2021)   PRAPARE - Hydrologist (Medical): No    Lack of Transportation (Non-Medical): No  Physical Activity: Insufficiently Active (06/04/2021)   Exercise Vital Sign    Days of Exercise per Week: 1 day    Minutes of Exercise per Session: 20 min  Stress: No Stress Concern Present (06/04/2021)   [place]    Feeling of Stress : Not at all  Social Connections: Unknown (06/04/2021)   Social Connection and  Isolation Panel [NHANES]    Frequency of Communication with Friends and Family: Not on file    Frequency of Social Gatherings with Friends and Family: More than three times a week    Attends Religious Services: Not on file    Active Member of Clubs or Organizations: Not on file    Attends Archivist Meetings: Not on file    Marital Status: Not on file     Family History: The patient's family history includes Alzheimer's disease in her sister; Heart Problems in her mother; Heart disease in her father and mother.  ROS:  Please see the history of present illness.     All other systems reviewed and are negative.  EKGs/Labs/Other Studies Reviewed:    The following studies were reviewed today:   EKG:  EKG is  ordered today.  The ekg ordered today demonstrates sinus bradycardia, rate 57.  Recent Labs: 10/14/2021: ALT 28; BUN 15; Creatinine, Ser 0.55; Hemoglobin 11.3; Platelets 256; Potassium 3.6; Sodium 139 02/20/2022: TSH <0.010  Recent Lipid Panel    Component Value Date/Time   CHOL 160 10/21/2021 0747   TRIG 69.0 10/21/2021 0747   HDL 74.10 10/21/2021 0747   CHOLHDL 2 10/21/2021 0747   VLDL 13.8 10/21/2021 0747   LDLCALC 72 10/21/2021 0747    Physical Exam:    VS:  BP (!) 150/68 (BP Location: Left Arm, Patient Position: Sitting, Cuff Size: Normal)   Pulse (!) 57   Ht '5\' 3"'$  (1.6 m)   Wt 117 lb 9.6 oz (53.3 kg)   SpO2 98%   BMI 20.83 kg/m     Wt Readings from Last 3 Encounters:  02/24/22 117 lb 9.6 oz (53.3 kg)  11/27/21 127 lb (57.6 kg)  11/18/21 130 lb (59 kg)     GEN:  Well nourished, well developed in no acute distress HEENT: Normal NECK: No JVD; No carotid bruits CARDIAC: Regular rate and rhythm, no murmurs RESPIRATORY:  Clear to auscultation without rales, wheezing or rhonchi  ABDOMEN: Soft, non-tender, non-distended MUSCULOSKELETAL:  No edema; No deformity  SKIN: Warm and dry NEUROLOGIC:  Alert and oriented x 3 PSYCHIATRIC:  Normal affect    ASSESSMENT:    1. Atrial flutter, unspecified type ([place])   2. Abnormal thyroid function test    PLAN:    In order of problems listed above:  Atrial flutter status post DC cardioversion 10/2020.  CHADS2 vasc score of 3 (age, gender).  Continue Toprol-XL 25 mg daily, continue Xarelto.  Amiodarone previously stopped due to thyroid dysfunction.   Abnormal TSH and Free T4 likely from amiodarone use.  Improving with stopping amiodarone.  Continue to hold amiodarone.  Repeat TSH free T4 in 3 months.  Keep appointment/follow-up with endocrinology.  Follow-up 6 months  Total encounter time 35 minutes  Greater than 50% was spent in counseling and coordination of care with the patient   Medication Adjustments/Labs and Tests Ordered: Current medicines are reviewed at length with the patient today.  Concerns regarding medicines are outlined above.  Orders Placed This Encounter  Procedures   TSH   T4, free   EKG 12-Lead    No orders of the defined types were placed in this encounter.    Patient Instructions  Medication Instructions:   Your physician recommends that you continue on your current medications as directed. Please refer to the Current Medication list given to you today.   *If you need a refill on your cardiac medications before your next appointment, please call your pharmacy*   Lab Work:  Your physician recommends that you return for lab work in: 3 months  TSH, Free T4  Nature conservation officer at [facility] 1st desk on the right to check in (REGISTRATION)  Lab hours: Monday- Friday (7:30 am- 5:30 pm)  If you have labs (blood work) drawn today and your tests are completely normal, you will receive your results only by: MyChart Message (if you have MyChart) OR A paper copy in the mail If you have any lab test that is abnormal or we need to change your treatment, we will call you to  review the results.   Testing/Procedures: None ordered   Follow-Up: At [facility], you and your health needs are our priority.  As part of our continuing mission to provide you with exceptional heart care, we have created designated Provider Care Teams.  These Care Teams include your primary Cardiologist (physician) and Advanced Practice Providers (APPs -  Physician Assistants and Nurse Practitioners) who all work together to provide you with the care you need, when you need it.  We recommend signing up for the patient portal called "MyChart".  Sign up information is provided on this After Visit Summary.  MyChart is used to connect with patients for Virtual Visits (Telemedicine).  Patients are able to view lab/test results, encounter notes, upcoming appointments, etc.  Non-urgent messages can be sent to your provider as well.   To learn more about what you can do with MyChart, go to [email or web address].    Your next appointment:   6 month(s)  The format for your next appointment:   In Person  Provider:   You may see Kate Sable, MD or one of the following Advanced Practice Providers on your designated Care Team:   Murray Hodgkins, NP Christell Faith, PA-C Cadence Kathlen Mody, [place]    Other Instructions N/A  Important Information About Sugar         Signed, Kate Sable, MD  02/24/2022 3:04 PM    [place]

## 2022-02-25 ENCOUNTER — Encounter: Payer: Self-pay | Admitting: Podiatry

## 2022-02-25 ENCOUNTER — Ambulatory Visit (INDEPENDENT_AMBULATORY_CARE_PROVIDER_SITE_OTHER): Payer: Medicare Other | Admitting: Podiatry

## 2022-02-25 DIAGNOSIS — M79674 Pain in right toe(s): Secondary | ICD-10-CM

## 2022-02-25 DIAGNOSIS — M79675 Pain in left toe(s): Secondary | ICD-10-CM

## 2022-02-25 DIAGNOSIS — L84 Corns and callosities: Secondary | ICD-10-CM

## 2022-02-25 DIAGNOSIS — B351 Tinea unguium: Secondary | ICD-10-CM

## 2022-02-25 NOTE — Progress Notes (Signed)
This patient returns to the office for evaluation and treatment of long thick painful nails .  This patient is unable to trim her own nails since the patient cannot reach her feet.  Patient says the nails are painful walking and wearing her shoes.  She returns for preventive foot care services.  General Appearance  Alert, conversant and in no acute stress.  Vascular  Dorsalis pedis and posterior tibial  pulses are weakly  palpable  bilaterally.  Capillary return is within normal limits  bilaterally. Temperature is within normal limits  bilaterally.  Neurologic  Senn-Weinstein monofilament wire test within normal limits  bilaterally. Muscle power within normal limits bilaterally.  Nails Thick disfigured discolored nails with subungual debris  from hallux to fifth toes bilaterally. No evidence of bacterial infection or drainage bilaterally.  Pincer nail left hallux.  Orthopedic  No limitations of motion  feet .  No crepitus or effusions noted.  No bony pathology or digital deformities noted.  Skin  normotropic skin with no porokeratosis noted bilaterally.  No signs of infections or ulcers noted. Asymptomatic pinch callus.  Onychomycosis  Pain in toes right foot  Pain in toes left foot  Debridement  of nails  1-5  B/L with a nail nipper.  Nails were then filed using a dremel tool with no incidents.  Patient was very responsive to drill.   RTC  12 weeks    Gardiner Barefoot DPM

## 2022-02-26 DIAGNOSIS — E0590 Thyrotoxicosis, unspecified without thyrotoxic crisis or storm: Secondary | ICD-10-CM | POA: Diagnosis not present

## 2022-02-26 DIAGNOSIS — E042 Nontoxic multinodular goiter: Secondary | ICD-10-CM | POA: Diagnosis not present

## 2022-02-27 ENCOUNTER — Ambulatory Visit (INDEPENDENT_AMBULATORY_CARE_PROVIDER_SITE_OTHER): Payer: Medicare Other | Admitting: Internal Medicine

## 2022-02-27 ENCOUNTER — Encounter: Payer: Self-pay | Admitting: Internal Medicine

## 2022-02-27 VITALS — BP 140/60 | HR 60 | Temp 98.6°F | Ht 63.0 in | Wt 124.2 lb

## 2022-02-27 DIAGNOSIS — Z1231 Encounter for screening mammogram for malignant neoplasm of breast: Secondary | ICD-10-CM | POA: Diagnosis not present

## 2022-02-27 DIAGNOSIS — N3000 Acute cystitis without hematuria: Secondary | ICD-10-CM

## 2022-02-27 DIAGNOSIS — D649 Anemia, unspecified: Secondary | ICD-10-CM | POA: Diagnosis not present

## 2022-02-27 DIAGNOSIS — E559 Vitamin D deficiency, unspecified: Secondary | ICD-10-CM

## 2022-02-27 DIAGNOSIS — R413 Other amnesia: Secondary | ICD-10-CM

## 2022-02-27 DIAGNOSIS — I10 Essential (primary) hypertension: Secondary | ICD-10-CM | POA: Diagnosis not present

## 2022-02-27 DIAGNOSIS — T452X1A Poisoning by vitamins, accidental (unintentional), initial encounter: Secondary | ICD-10-CM

## 2022-02-27 DIAGNOSIS — Z1211 Encounter for screening for malignant neoplasm of colon: Secondary | ICD-10-CM

## 2022-02-27 MED ORDER — DONEPEZIL HCL 5 MG PO TABS: 5.0000 mg | ORAL_TABLET | Freq: Every day | ORAL | 3 refills | Status: DC

## 2022-02-27 NOTE — Patient Instructions (Addendum)
Dr. Forde Dandy new PCP    Hooten, Florinda Marker., MD   [place]   [facility] [place], [place] [address]   [phone] (Work)   [phone] (Fax)    Try Tylenol   Consider prevnar 20 in 06/16/24  Consider another covid 19 shot in the fall   Pneumococcal Conjugate Vaccine (Prevnar 20) Suspension for Injection What is this medication? PNEUMOCOCCAL VACCINE (NEU mo KOK al vak SEEN) is a vaccine. It prevents pneumococcus bacterial infections. These bacteria can cause serious infections like pneumonia, meningitis, and blood infections. This vaccine will not treat an infection and will not cause infection. This vaccine is recommended for adults 18 years and older. This medicine may be used for other purposes; ask your health care provider or pharmacist if you have questions. COMMON BRAND NAME(S): Prevnar 20 What should I tell my care team before I take this medication? They need to know if you have any of these conditions: bleeding disorder fever immune system problems an unusual or allergic reaction to pneumococcal vaccine, diphtheria toxoid, other vaccines, other medicines, foods, dyes, or preservatives pregnant or trying to get pregnant breast-feeding How should I use this medication? This vaccine is injected into a muscle. It is given by a health care provider. A copy of Vaccine Information Statements will be given before each vaccination. Be sure to read this information carefully each time. This sheet may change often. Talk to your health care provider about the use of this medicine in children. Special care may be needed. Overdosage: If you think you have taken too much of this medicine contact a poison control center or emergency room at once. NOTE: This medicine is only for you. Do not share this medicine with others. What if I miss a dose? This does not apply. This medicine is not for regular use. What may interact with this medication? medicines  for cancer chemotherapy medicines that suppress your immune function steroid medicines like prednisone or cortisone This list may not describe all possible interactions. Give your health care provider a list of all the medicines, herbs, non-prescription drugs, or dietary supplements you use. Also tell them if you smoke, drink alcohol, or use illegal drugs. Some items may interact with your medicine. What should I watch for while using this medication? Mild fever and pain should go away in 3 days or less. Report any unusual symptoms to your health care provider. What side effects may I notice from receiving this medication? Side effects that you should report to your doctor or health care professional as soon as possible: allergic reactions (skin rash, itching or hives; swelling of the face, lips, or tongue) confusion fast, irregular heartbeat fever over 102 degrees F muscle weakness seizures trouble breathing unusual bruising or bleeding Side effects that usually do not require medical attention (report to your doctor or health care professional if they continue or are bothersome): fever of 102 degrees F or less headache joint pain muscle cramps, pain pain, tender at site where injected This list may not describe all possible side effects. Call your doctor for medical advice about side effects. You may report side effects to FDA at 1-800-FDA-1088. Where should I keep my medication? This vaccine is only given by a health care provider. It will not be stored at home. NOTE: This sheet is a summary. It may not cover all possible information. If you have questions about this medicine, talk to your doctor, pharmacist, or health care provider.  2023 Elsevier/Gold  Standard (2020-03-16 00:00:00)   Urinary Tract Infection, Adult  A urinary tract infection (UTI) is an infection of any part of the urinary tract. The urinary tract includes the kidneys, ureters, bladder, and urethra. These organs  make, store, and get rid of urine in the body. An upper UTI affects the ureters and kidneys. A lower UTI affects the bladder and urethra. What are the causes? Most urinary tract infections are caused by bacteria in your genital area around your urethra, where urine leaves your body. These bacteria grow and cause inflammation of your urinary tract. What increases the risk? You are more likely to develop this condition if: You have a urinary catheter that stays in place. You are not able to control when you urinate or have a bowel movement (incontinence). You are female and you: Use a spermicide or diaphragm for birth control. Have low estrogen levels. Are pregnant. You have certain genes that increase your risk. You are sexually active. You take antibiotic medicines. You have a condition that causes your flow of urine to slow down, such as: An enlarged prostate, if you are female. Blockage in your urethra. A kidney stone. A nerve condition that affects your bladder control (neurogenic bladder). Not getting enough to drink, or not urinating often. You have certain medical conditions, such as: Diabetes. A weak disease-fighting system (immunesystem). Sickle cell disease. Gout. Spinal cord injury. What are the signs or symptoms? Symptoms of this condition include: Needing to urinate right away (urgency). Frequent urination. This may include small amounts of urine each time you urinate. Pain or burning with urination. Blood in the urine. Urine that smells bad or unusual. Trouble urinating. Cloudy urine. Vaginal discharge, if you are female. Pain in the abdomen or the lower back. You may also have: Vomiting or a decreased appetite. Confusion. Irritability or tiredness. A fever or chills. Diarrhea. The first symptom in older adults may be confusion. In some cases, they may not have any symptoms until the infection has worsened. How is this diagnosed? This condition is diagnosed  based on your medical history and a physical exam. You may also have other tests, including: Urine tests. Blood tests. Tests for STIs (sexually transmitted infections). If you have had more than one UTI, a cystoscopy or imaging studies may be done to determine the cause of the infections. How is this treated? Treatment for this condition includes: Antibiotic medicine. Over-the-counter medicines to treat discomfort. Drinking enough water to stay hydrated. If you have frequent infections or have other conditions such as a kidney stone, you may need to see a health care provider who specializes in the urinary tract (urologist). In rare cases, urinary tract infections can cause sepsis. Sepsis is a life-threatening condition that occurs when the body responds to an infection. Sepsis is treated in the hospital with IV antibiotics, fluids, and other medicines. Follow these instructions at home:  Medicines Take over-the-counter and prescription medicines only as told by your health care provider. If you were prescribed an antibiotic medicine, take it as told by your health care provider. Do not stop using the antibiotic even if you start to feel better. General instructions Make sure you: Empty your bladder often and completely. Do not hold urine for long periods of time. Empty your bladder after sex. Wipe from front to back after urinating or having a bowel movement if you are female. Use each tissue only one time when you wipe. Drink enough fluid to keep your urine pale yellow. Keep all follow-up visits.  This is important. Contact a health care provider if: Your symptoms do not get better after 1-2 days. Your symptoms go away and then return. Get help right away if: You have severe pain in your back or your lower abdomen. You have a fever or chills. You have nausea or vomiting. Summary A urinary tract infection (UTI) is an infection of any part of the urinary tract, which includes the  kidneys, ureters, bladder, and urethra. Most urinary tract infections are caused by bacteria in your genital area. Treatment for this condition often includes antibiotic medicines. If you were prescribed an antibiotic medicine, take it as told by your health care provider. Do not stop using the antibiotic even if you start to feel better. Keep all follow-up visits. This is important. This information is not intended to replace advice given to you by your health care provider. Make sure you discuss any questions you have with your health care provider. Document Revised: 02/24/2020 Document Reviewed: 02/24/2020 Elsevier Patient Education  [place].

## 2022-02-27 NOTE — Progress Notes (Signed)
Chief Complaint  Patient presents with   Follow-up    6 month f/u   F/u  1. Right knee pain s/p surgery 10/2021 f/u Dr. Marry Guan  2. H/o UTI 09/2021 E coli tx'ed with ortho     Review of Systems  Constitutional:  Negative for weight loss.  HENT:  Negative for hearing loss.   Eyes:  Negative for blurred vision.  Respiratory:  Negative for shortness of breath.   Cardiovascular:  Negative for chest pain.  Gastrointestinal:  Negative for abdominal pain and blood in stool.  Genitourinary:  Negative for dysuria.  Musculoskeletal:  Negative for falls and joint pain.  Skin:  Negative for rash.  Neurological:  Negative for headaches.  Psychiatric/Behavioral:  Negative for depression.    Past Medical History:  Diagnosis Date   A-fib [facility])    a.) CHA2DS2-VASc = 5 (age x 2, sex, HTN, aortic plaque). b.) s/p DCCV (200J x 2) 11/07/2020. c.) rate/rhythm maintained on oral amiodarone; chronically anticoagulated using rivaroxaban.   Adrenal adenoma, left    Anemia    Aortic atherosclerosis (HCC)    Arthritis    knees, right shoulder    B12 deficiency    Basal cell carcinoma    CAD (coronary artery disease)    Cyst of right kidney    DDD (degenerative disc disease), lumbar    History of chicken pox    History of kidney stones    HTN (hypertension)    Long term current use of anticoagulant    a.) rivaroxaban   Memory loss    Microcytosis    Osteopenia    PAC (premature atrial contraction)    Thyromegaly    Past Surgical History:  Procedure Laterality Date   BUNIONECTOMY WITH HAMMERTOE RECONSTRUCTION  2009   CARDIOVERSION N/A 11/07/2020   Procedure: CARDIOVERSION (200J x 2); Location: [place]; Surgeon: Kate Sable, MD   CATARACT EXTRACTION Bilateral    2013/2014   CESAREAN SECTION N/A 1978   KNEE ARTHROPLASTY Left 12/12/2020   Procedure: COMPUTER ASSISTED TOTAL KNEE ARTHROPLASTY;  Surgeon: Dereck Leep, MD;  Location: [facility] OR[facility];  Service: Orthopedics;  Laterality: Left;   KNEE  ARTHROPLASTY Right 10/28/2021   Procedure: COMPUTER ASSISTED TOTAL KNEE ARTHROPLASTY;  Surgeon: Dereck Leep, MD;  Location: [facility] OR[facility];  Service: Orthopedics;  Laterality: Right;   TEE WITHOUT CARDIOVERSION N/A 11/07/2020   Procedure: TRANSESOPHAGEAL ECHOCARDIOGRAM (TEE);  Surgeon: Kate Sable, MD;  Location: [facility] OR[facility];  Service: Cardiovascular;  Laterality: N/A;   Family History  Problem Relation Age of Onset   Heart disease Mother        died when pt was 73 y.o    Heart Problems Mother    Heart disease Father    Alzheimer's disease Sister    Social History   Socioeconomic History   Marital status: Widowed    Spouse name: Not on file   Number of children: Not on file   Years of education: Not on file   Highest education level: Not on file  Occupational History   Not on file  Tobacco Use   Smoking status: Never   Smokeless tobacco: Never  Vaping Use   Vaping Use: Never used  Substance and Sexual Activity   Alcohol use: Not Currently   Drug use: Never   Sexual activity: Not on file  Other Topics Concern   Not on file  Social History Narrative   From [place] lived in [place] since late 1990s early 2000    Lives  with daughter    Secretary/administrator ed    Former Pharmacist, hospital    No guns, wears seat belt, safe in relationship    Widowed       2 daughters 1/2 in [place] Determinants of Health   Financial Resource Strain: Low Risk  (06/04/2021)   Overall Financial Resource Strain (CARDIA)    Difficulty of Paying Living Expenses: Not hard at all  Food Insecurity: No Food Insecurity (06/04/2021)   Hunger Vital Sign    Worried About Running Out of Food in the Last Year: Never true    Ran Out of Food in the Last Year: Never true  Transportation Needs: No Transportation Needs (06/04/2021)   PRAPARE - Hydrologist (Medical): No    Lack of Transportation (Non-Medical): No  Physical Activity: Insufficiently Active (06/04/2021)   Exercise Vital Sign     Days of Exercise per Week: 1 day    Minutes of Exercise per Session: 20 min  Stress: No Stress Concern Present (06/04/2021)   [place]    Feeling of Stress : Not at all  Social Connections: Unknown (06/04/2021)   Social Connection and Isolation Panel [NHANES]    Frequency of Communication with Friends and Family: Not on file    Frequency of Social Gatherings with Friends and Family: More than three times a week    Attends Religious Services: Not on file    Active Member of Clubs or Organizations: Not on file    Attends Archivist Meetings: Not on file    Marital Status: Not on file  Intimate Partner Violence: Not At Risk (06/04/2021)   Humiliation, Afraid, Rape, and Kick questionnaire    Fear of Current or Ex-Partner: No    Emotionally Abused: No    Physically Abused: No    Sexually Abused: No   Current Meds  Medication Sig   acetaminophen (TYLENOL) 650 MG CR tablet Take 1,300 mg by mouth every 8 (eight) hours as needed for pain.   Ascorbic Acid (VITAMIN C PO) Take by mouth daily.   B Complex-C-Folic Acid TABS Take 1 tablet by mouth daily.   Calcium Carbonate-Vit D-Min (CALCIUM 600+D3 PLUS MINERALS PO) Take 1,200 mg by mouth daily.   celecoxib (CELEBREX) 200 MG capsule Take 1 capsule (200 mg total) by mouth 2 (two) times daily.   cholecalciferol (VITAMIN D3) 25 MCG (1000 UNIT) tablet Take 1,000 Units by mouth daily.   Ferrous Sulfate (IRON) 325 (65 Fe) MG TABS Take 325 mg by mouth daily.   furosemide (LASIX) 20 MG tablet Take 1 tablet (20 mg total) by mouth daily as needed. For swelling and shortness of breath.   methimazole (TAPAZOLE) 5 MG tablet Take 5 mg by mouth 3 (three) times daily.   metoprolol succinate (TOPROL XL) 25 MG 24 hr tablet Take 1 tablet (25 mg total) by mouth daily.   Multiple Vitamins-Minerals (MULTIVITAMIN WITH MINERALS) tablet Take 1 tablet by mouth daily.   Omega-3 Fatty Acids (FISH OIL)  1200 MG CPDR Take 1,200 mg by mouth daily.   oxyCODONE (OXY IR/ROXICODONE) 5 MG immediate release tablet Take 1 tablet (5 mg total) by mouth every 4 (four) hours as needed for severe pain.   rivaroxaban (XARELTO) 20 MG TABS tablet Take 1 tablet (20 mg total) by mouth daily with supper.   vitamin B-12 (CYANOCOBALAMIN) 100 MCG tablet Take 100 mcg by mouth daily.   [DISCONTINUED] donepezil (  ARICEPT) 5 MG tablet Take 1 tablet (5 mg total) by mouth at bedtime.   [DISCONTINUED] traMADol (ULTRAM) 50 MG tablet Take 1 tablet (50 mg total) by mouth at bedtime as needed for moderate pain.   [DISCONTINUED] traMADol (ULTRAM) 50 MG tablet Take 1 tablet (50 mg total) by mouth every 4 (four) hours as needed for moderate pain.   No Known Allergies Recent Results (from the past 2160 hour(s))  TSH     Status: Abnormal   Collection Time: 12/24/21  2:41 PM  Result Value Ref Range   TSH <0.010 (L) 0.350 - 4.500 uIU/mL    Comment: Performed by a 3rd Generation assay with a functional sensitivity of <=0.01 uIU/mL. Performed at [facility], [address] [place], [place] [address]   T4, free     Status: Abnormal   Collection Time: 12/24/21  2:41 PM  Result Value Ref Range   Free T4 3.05 (H) 0.61 - 1.12 ng/dL    Comment: (NOTE) Biotin ingestion may interfere with free T4 tests. If the results are inconsistent with the TSH level, previous test results, or the clinical presentation, then consider biotin interference. If needed, order repeat testing after stopping biotin. Performed at [facility], [address] [place], [place] [address]   T4, free     Status: Abnormal   Collection Time: 02/20/22 11:25 AM  Result Value Ref Range   Free T4 2.09 (H) 0.61 - 1.12 ng/dL    Comment: (NOTE) Biotin ingestion may interfere with free T4 tests. If the results are inconsistent with the TSH level, previous test results, or the clinical presentation, then consider biotin interference. If  needed, order repeat testing after stopping biotin. Performed at [facility], [address] [place], [place] [address]   TSH     Status: Abnormal   Collection Time: 02/20/22 11:25 AM  Result Value Ref Range   TSH <0.010 (L) 0.350 - 4.500 uIU/mL    Comment: Performed by a 3rd Generation assay with a functional sensitivity of <=0.01 uIU/mL. Performed at [facility], [address] [place],  [address]    Objective  Body mass index is 22 kg/m. Wt Readings from Last 3 Encounters:  02/27/22 124 lb 3.2 oz (56.3 kg)  02/24/22 117 lb 9.6 oz (53.3 kg)  11/27/21 127 lb (57.6 kg)   Temp Readings from Last 3 Encounters:  02/27/22 98.6 F (37 C) (Oral)  10/30/21 98.2 F (36.8 C)  08/30/21 98.2 F (36.8 C) (Oral)   BP Readings from Last 3 Encounters:  02/27/22 (!) 140/60  02/24/22 (!) 150/68  11/27/21 (!) 142/68   Pulse Readings from Last 3 Encounters:  02/27/22 60  02/24/22 (!) 57  11/27/21 68    Physical Exam Vitals and nursing note reviewed.  Constitutional:      Appearance: Normal appearance. She is well-developed and well-groomed.  HENT:     Head: Normocephalic and atraumatic.  Eyes:     Conjunctiva/sclera: Conjunctivae normal.     Pupils: Pupils are equal, round, and reactive to light.  Cardiovascular:     Rate and Rhythm: Normal rate and regular rhythm.     Heart sounds: Normal heart sounds. No murmur heard. Pulmonary:     Effort: Pulmonary effort is normal.     Breath sounds: Normal breath sounds.  Abdominal:     General: Abdomen is flat. Bowel sounds are normal.     Tenderness: There is no abdominal tenderness.  Musculoskeletal:  General: No tenderness.  Skin:    General: Skin is warm and dry.  Neurological:     General: No focal deficit present.     Mental Status: She is alert and oriented to person, place, and time. Mental status is at baseline.     Cranial Nerves: Cranial nerves 2-12 are intact.     Motor: Motor  function is intact.     Coordination: Coordination is intact.     Gait: Gait is intact.  Psychiatric:        Attention and Perception: Attention and perception normal.        Mood and Affect: Mood and affect normal.        Speech: Speech normal.        Behavior: Behavior normal. Behavior is cooperative.        Thought Content: Thought content normal.        Cognition and Memory: Cognition and memory normal.        Judgment: Judgment normal.     Assessment  Plan  Acute cystitis without hematuria - Plan: Urine Culture  Vitamin D overdose, accidental or unintentional, initial encounter - Plan: Vitamin D (25 hydroxy)   Memory loss stable- Plan: donepezil (ARICEPT) 5 MG tablet working  Anemia, unspecified type cbc  Hypertension, at goal for age- Plan: Comprehensive metabolic panel, CBC w/Diff  Cont meds per cards   HM Flu shot  and prevnar utd and pna 23 Consider prevnar 20 in 2025  Consider shingrix vaccine Tdap 05/20/21 utd  covid 3/3 consider booster 4th dose fall 2023    Never smoker    Pap out of age window  cologuard neg 09/2018 ordered   Mammogram negative 01/20/22 ordered 2024  dexa 12/2018 osteopenia on calcium 1200 mg qd and vitamin D3 1000 to 2000 iu daily  Never had colonoscopy    Skin Dr. Kellie Moor h/o [facility] nose had appt 11/2021 f/u in 1 year   Surgery left knee 12/12/20 Dr. Marry Guan arthritis another appt right knee 10/28/21  S/p b/l knee replacement   Provider: Dr. Olivia Mackie McLean-Scocuzza-Internal Medicine

## 2022-02-28 LAB — CBC WITH DIFFERENTIAL/PLATELET
Basophils Absolute: 0.1 10*3/uL (ref 0.0–0.1)
Basophils Relative: 0.9 % (ref 0.0–3.0)
Eosinophils Absolute: 0.5 10*3/uL (ref 0.0–0.7)
Eosinophils Relative: 4.5 % (ref 0.0–5.0)
HCT: 36.1 % (ref 36.0–46.0)
Hemoglobin: 11.5 g/dL — ABNORMAL LOW (ref 12.0–15.0)
Lymphocytes Relative: 35.4 % (ref 12.0–46.0)
Lymphs Abs: 3.6 10*3/uL (ref 0.7–4.0)
MCHC: 31.9 g/dL (ref 30.0–36.0)
MCV: 72 fl — ABNORMAL LOW (ref 78.0–100.0)
Monocytes Absolute: 0.8 10*3/uL (ref 0.1–1.0)
Monocytes Relative: 8.2 % (ref 3.0–12.0)
Neutro Abs: 5.1 10*3/uL (ref 1.4–7.7)
Neutrophils Relative %: 51 % (ref 43.0–77.0)
Platelets: 228 10*3/uL (ref 150.0–400.0)
RBC: 5.01 Mil/uL (ref 3.87–5.11)
RDW: 15.8 % — ABNORMAL HIGH (ref 11.5–15.5)
WBC: 10.1 10*3/uL (ref 4.0–10.5)

## 2022-02-28 LAB — COMPREHENSIVE METABOLIC PANEL
ALT: 10 U/L (ref 0–35)
AST: 13 U/L (ref 0–37)
Albumin: 3.9 g/dL (ref 3.5–5.2)
Alkaline Phosphatase: 98 U/L (ref 39–117)
BUN: 23 mg/dL (ref 6–23)
CO2: 28 mEq/L (ref 19–32)
Calcium: 9.5 mg/dL (ref 8.4–10.5)
Chloride: 103 mEq/L (ref 96–112)
Creatinine, Ser: 0.56 mg/dL (ref 0.40–1.20)
GFR: 85.42 mL/min (ref >60.00)
Glucose, Bld: 77 mg/dL (ref 70–99)
Potassium: 4 mEq/L (ref 3.5–5.1)
Sodium: 139 mEq/L (ref 135–145)
Total Bilirubin: 0.5 mg/dL (ref 0.2–1.2)
Total Protein: 6.5 g/dL (ref 6.0–8.3)

## 2022-02-28 LAB — URINE CULTURE
MICRO NUMBER:: 13732064
Result:: NO GROWTH
SPECIMEN QUALITY:: ADEQUATE

## 2022-02-28 LAB — VITAMIN D 25 HYDROXY (VIT D DEFICIENCY, FRACTURES): VITD: 65.22 ng/mL (ref 30.00–100.00)

## 2022-03-06 ENCOUNTER — Ambulatory Visit: Payer: Medicare Other | Admitting: Podiatry

## 2022-04-18 DIAGNOSIS — E0590 Thyrotoxicosis, unspecified without thyrotoxic crisis or storm: Secondary | ICD-10-CM | POA: Diagnosis not present

## 2022-05-12 ENCOUNTER — Other Ambulatory Visit: Payer: Self-pay

## 2022-05-12 MED ORDER — METOPROLOL SUCCINATE ER 25 MG PO TB24: 25.0000 mg | ORAL_TABLET | Freq: Every day | ORAL | 2 refills | Status: DC

## 2022-05-17 DIAGNOSIS — Z23 Encounter for immunization: Secondary | ICD-10-CM | POA: Diagnosis not present

## 2022-05-20 IMAGING — DX DG KNEE 1-2V PORT*R*
2 series · 2 of 2 positions shown · non-contrast
Comparison: None.

CLINICAL DATA: Status postop

EXAM:
PORTABLE RIGHT KNEE - 1-2 VIEW

[knee ap]
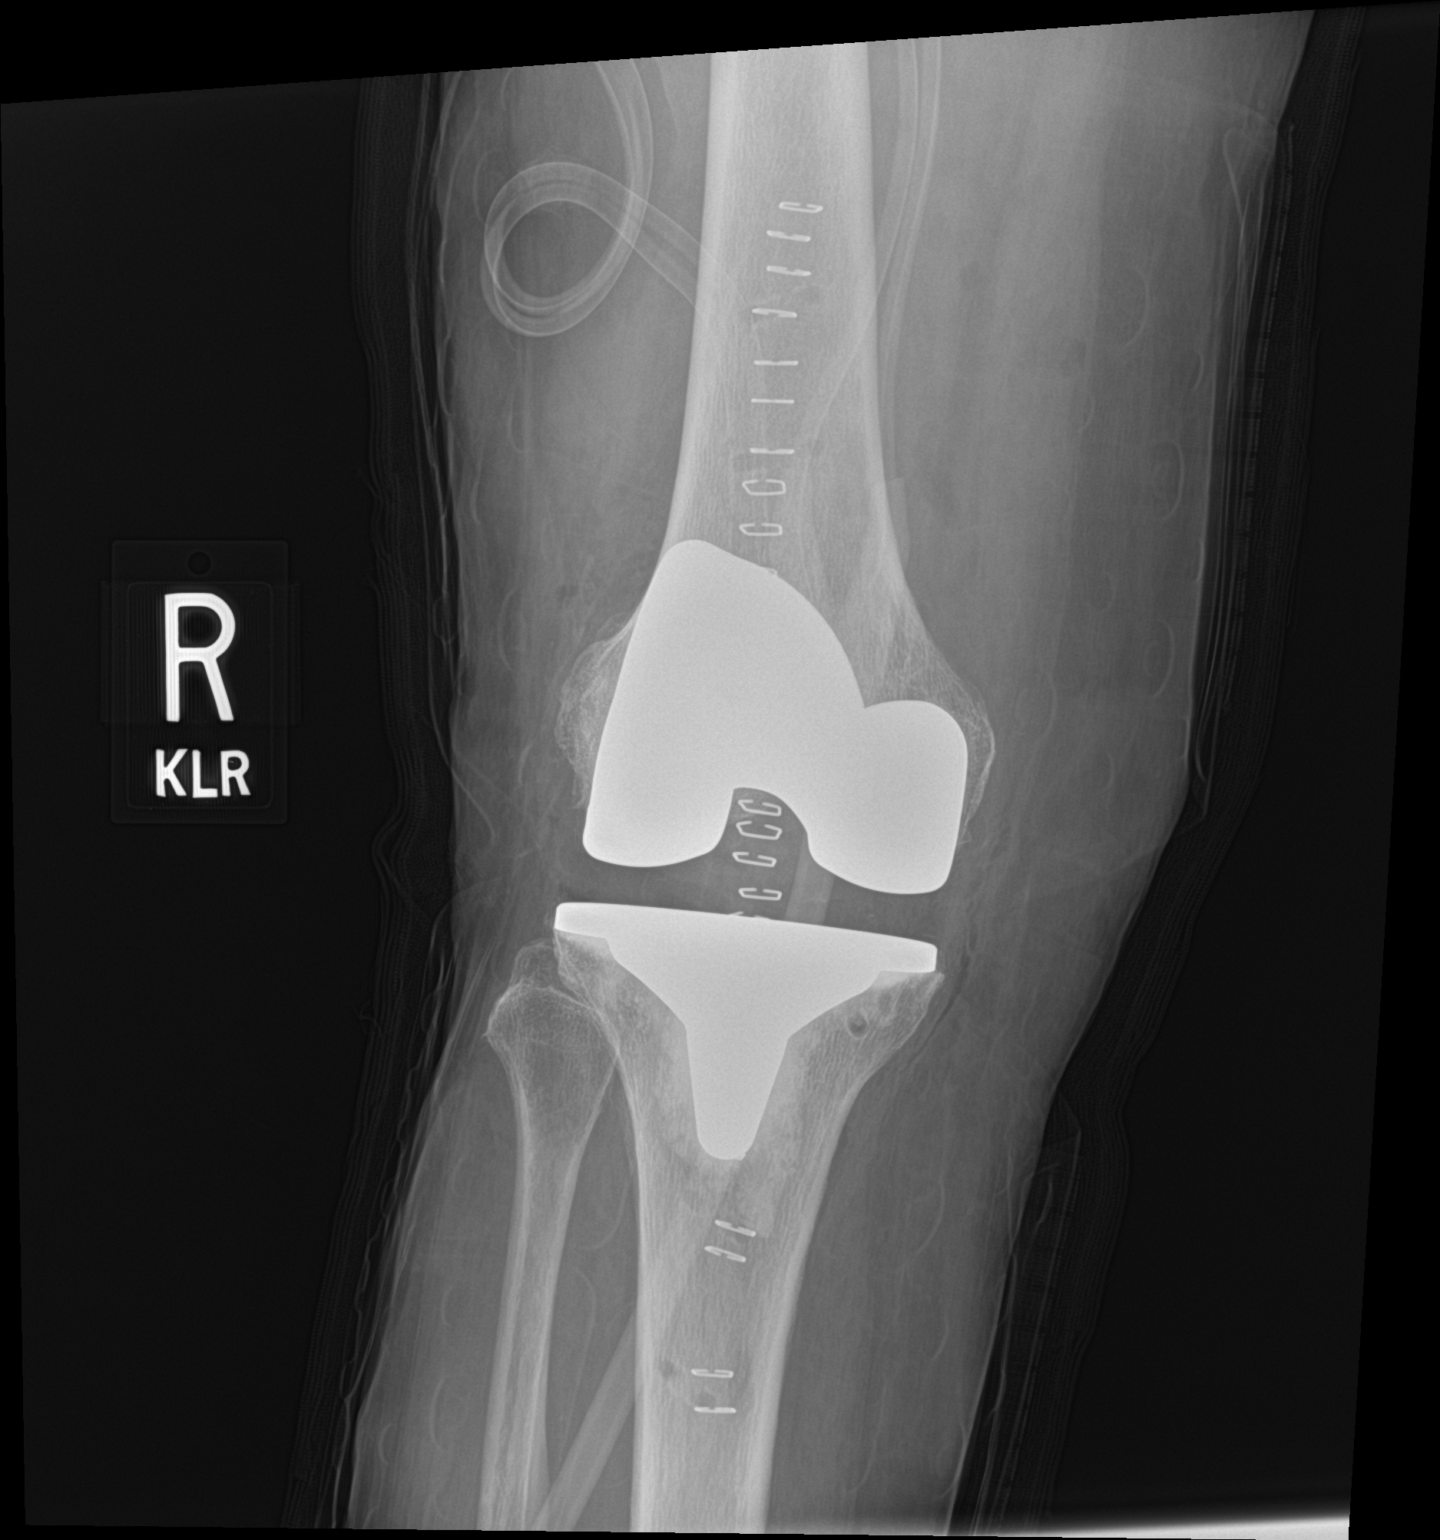

[knee lat]
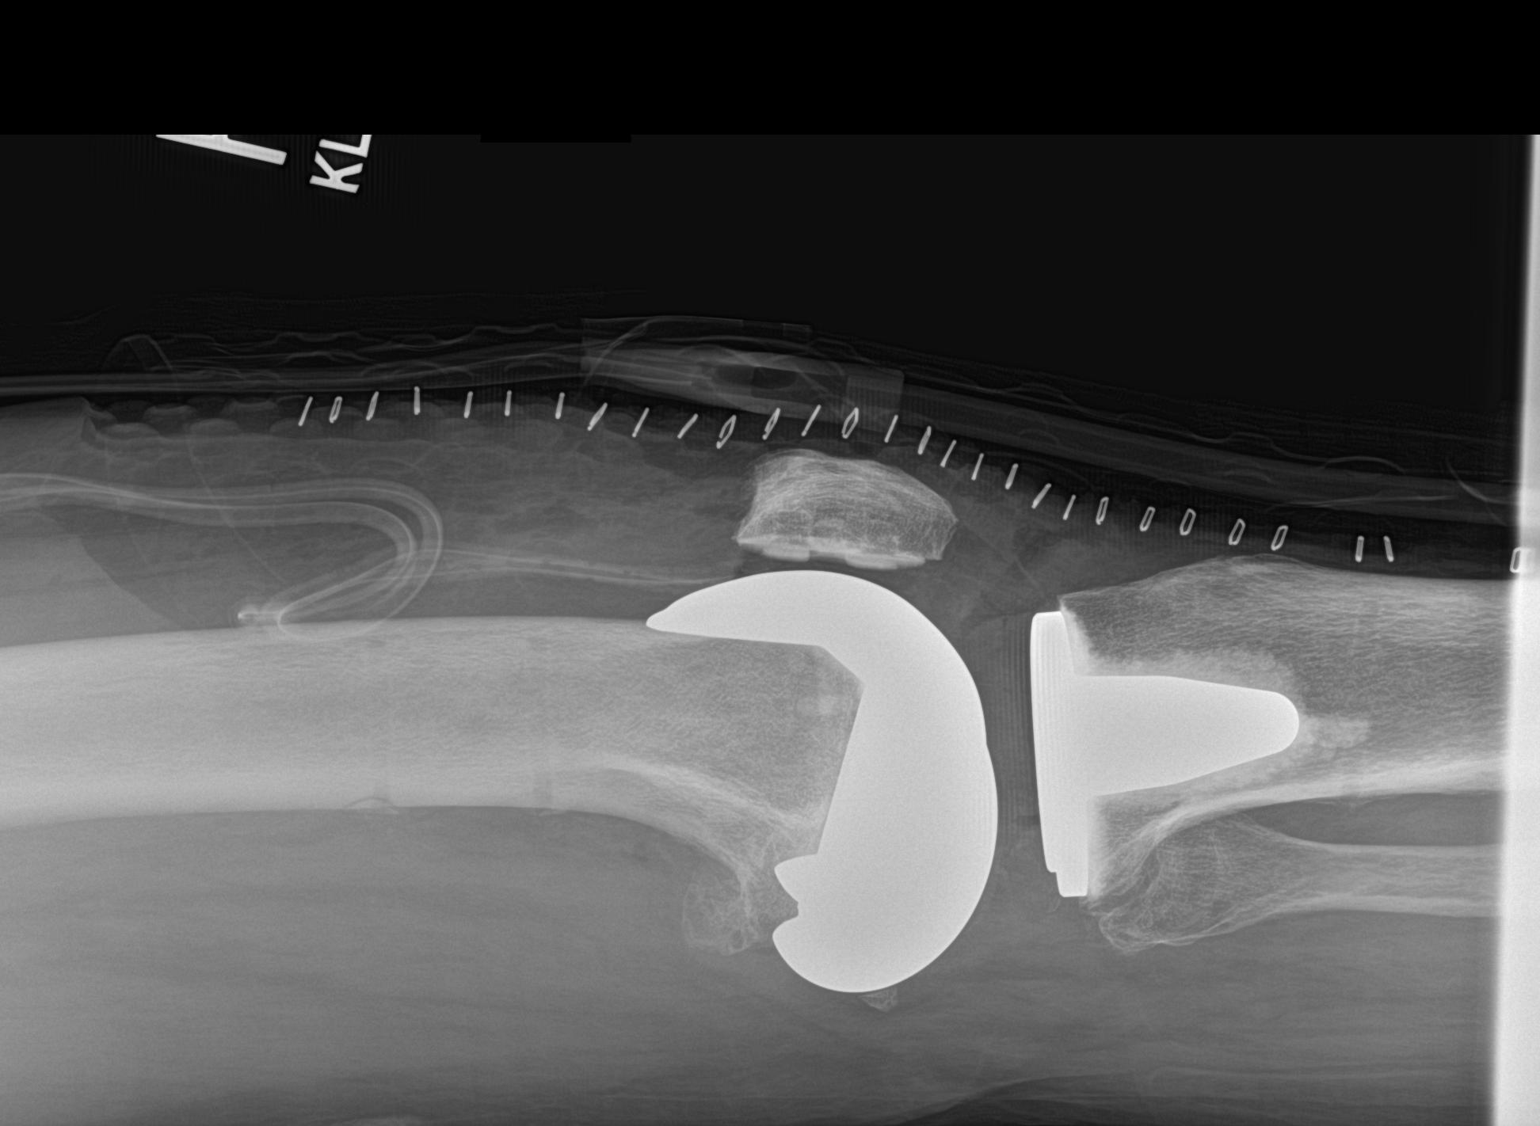

[2 of 2 positions shown; findings below may reference images not displayed]

FINDINGS: There is a right total knee arthroplasty in normal alignment without
evidence of loosening or periprosthetic fracture. Expected soft
tissue changes. There is a drain in place.
IMPRESSION: Right total knee arthroplasty. No evidence of immediate hardware
complication.

## 2022-05-29 ENCOUNTER — Ambulatory Visit (INDEPENDENT_AMBULATORY_CARE_PROVIDER_SITE_OTHER): Payer: Medicare Other | Admitting: Podiatry

## 2022-05-29 ENCOUNTER — Encounter: Payer: Self-pay | Admitting: Podiatry

## 2022-05-29 DIAGNOSIS — M79674 Pain in right toe(s): Secondary | ICD-10-CM | POA: Diagnosis not present

## 2022-05-29 DIAGNOSIS — B351 Tinea unguium: Secondary | ICD-10-CM | POA: Diagnosis not present

## 2022-05-29 DIAGNOSIS — L84 Corns and callosities: Secondary | ICD-10-CM | POA: Diagnosis not present

## 2022-05-29 DIAGNOSIS — M79675 Pain in left toe(s): Secondary | ICD-10-CM

## 2022-05-29 NOTE — Progress Notes (Signed)
This patient returns to the office for evaluation and treatment of long thick painful nails .  This patient is unable to trim her own nails since the patient cannot reach her feet.  Patient says the nails are painful walking and wearing her shoes.  She returns for preventive foot care services.  General Appearance  Alert, conversant and in no acute stress.  Vascular  Dorsalis pedis and posterior tibial  pulses are weakly  palpable  bilaterally.  Capillary return is within normal limits  bilaterally. Temperature is within normal limits  bilaterally.  Neurologic  Senn-Weinstein monofilament wire test within normal limits  bilaterally. Muscle power within normal limits bilaterally.  Nails Thick disfigured discolored nails with subungual debris  from hallux to fifth toes bilaterally. No evidence of bacterial infection or drainage bilaterally.  Pincer nail left hallux.  Orthopedic  No limitations of motion  feet .  No crepitus or effusions noted.  No bony pathology or digital deformities noted.  Skin  normotropic skin with no porokeratosis noted bilaterally.  No signs of infections or ulcers noted. Symptomatic pinch callus.  Onychomycosis  Pain in toes right foot  Pain in toes left foot.  Callus was debrided using dremel tool.  Debridement  of nails  1-5  B/L with a nail nipper.  Nails were then filed using a dremel tool with no incidents.  Patient was very responsive to drill.   RTC  12 weeks    Gardiner Barefoot DPM

## 2022-06-11 ENCOUNTER — Encounter: Payer: Self-pay | Admitting: Cardiology

## 2022-06-18 ENCOUNTER — Other Ambulatory Visit
Admission: RE | Admit: 2022-06-18 | Discharge: 2022-06-18 | Disposition: A | Discharge status: Home / Self Care | Payer: Medicare Other | Source: Ambulatory Visit | Attending: Cardiology | Admitting: Cardiology

## 2022-06-18 DIAGNOSIS — R946 Abnormal results of thyroid function studies: Secondary | ICD-10-CM

## 2022-06-18 LAB — TSH: TSH: 0.82 u[IU]/mL (ref 0.350–4.500)

## 2022-06-18 LAB — T4, FREE: Free T4: 0.69 ng/dL (ref 0.61–1.12)

## 2022-07-01 ENCOUNTER — Encounter: Payer: Self-pay | Admitting: Family Medicine

## 2022-07-01 ENCOUNTER — Ambulatory Visit (INDEPENDENT_AMBULATORY_CARE_PROVIDER_SITE_OTHER): Payer: Medicare Other | Admitting: Family Medicine

## 2022-07-01 VITALS — BP 90/60 | HR 66 | Temp 97.9°F | Ht 63.0 in | Wt 126.2 lb

## 2022-07-01 DIAGNOSIS — R413 Other amnesia: Secondary | ICD-10-CM

## 2022-07-01 DIAGNOSIS — E010 Iodine-deficiency related diffuse (endemic) goiter: Secondary | ICD-10-CM

## 2022-07-01 DIAGNOSIS — I4819 Other persistent atrial fibrillation: Secondary | ICD-10-CM | POA: Diagnosis not present

## 2022-07-01 DIAGNOSIS — I700 Atherosclerosis of aorta: Secondary | ICD-10-CM | POA: Diagnosis not present

## 2022-07-01 DIAGNOSIS — Z23 Encounter for immunization: Secondary | ICD-10-CM | POA: Diagnosis not present

## 2022-07-01 DIAGNOSIS — I10 Essential (primary) hypertension: Secondary | ICD-10-CM

## 2022-07-01 DIAGNOSIS — R739 Hyperglycemia, unspecified: Secondary | ICD-10-CM

## 2022-07-01 DIAGNOSIS — E559 Vitamin D deficiency, unspecified: Secondary | ICD-10-CM

## 2022-07-01 DIAGNOSIS — R718 Other abnormality of red blood cells: Secondary | ICD-10-CM

## 2022-07-01 MED ORDER — IRON 325 (65 FE) MG PO TABS: 325.0000 mg | ORAL_TABLET | ORAL | Status: DC

## 2022-07-01 NOTE — Progress Notes (Signed)
SUBJECTIVE:   Chief Complaint  Patient presents with   Transitions Of Care   HPI Presents to clinic to transfer care.  She is accompanied by her daughter who lives with her.   No acute concerns today  Hypertension Asymptomatic.  Prescribed Metoprolol XL 25 mg daily, Lasix 20 mg daily as needed.  BP as home 120's/60-70's.    Hyperthyroid Asymptomatic.  On Methimazole 5 mg daily. Follows with Endocrinology at [facility].  Memory loss Daughter reports is concerned with patient getting lost while driving.  Took her keys from her so she is unable to drive.  She does not feel that her memory has declined since that time.  She feels this is the normal aging process.  Has not been evaluated by neurology for for memory loss.  She was prescribed donepezil 5 mg at night by previous PCP.  Patient feels like her memory is okay.    A-fib Recently had ablation. Currently on Xarelto 20 mg daily.  Rate controlled with metoprolol 25 mg daily.  Follows with cardiology.    PERTINENT PMH / PSH: HTN A Fib on Xarelto CAD Thyromegaly Osteopenia  OBJECTIVE:  BP 90/60   Pulse 66   Temp 97.9 F (36.6 C) (Oral)   Ht '5\' 3"'$  (1.6 m)   Wt 126 lb 3.2 oz (57.2 kg)   SpO2 99%   BMI 22.36 kg/m    Physical Exam Vitals reviewed.  Constitutional:      General: She is not in acute distress.    Appearance: She is not ill-appearing.  HENT:     Head: Normocephalic.     Nose: Nose normal.  Eyes:     Conjunctiva/sclera: Conjunctivae normal.  Cardiovascular:     Rate and Rhythm: Normal rate and regular rhythm.     Heart sounds: Normal heart sounds.  Pulmonary:     Effort: Pulmonary effort is normal.     Breath sounds: Normal breath sounds.  Abdominal:     General: Abdomen is flat. Bowel sounds are normal.     Palpations: Abdomen is soft.  Musculoskeletal:        General: Normal range of motion.     Cervical back: Normal range of motion.  Neurological:     Mental Status: She is alert and oriented to  person, place, and time. Mental status is at baseline.  Psychiatric:        Mood and Affect: Mood normal.        Behavior: Behavior normal.        Thought Content: Thought content normal.        Judgment: Judgment normal.     ASSESSMENT/PLAN:  Hypertension, unspecified type Assessment & Plan: BP today initially 140/70.  Repeat 90/60.  Asymptomatic. Increase fluids Monitor blood pressure at home Strict return precautions provided  Orders: -     Comprehensive metabolic panel; Future  Aortic atherosclerosis ([place]) Assessment & Plan: Not on statin therapy.  LDL less than 100 at goal. Will discuss with patient at next visit to initiate statin.  Orders: -     Lipid panel; Future  Persistent atrial fibrillation (HCC) Assessment & Plan: Rate controlled.  Recent cardiac ablation. Continue Xarelto 20 mg daily Continue metoprolol XL 25 mg daily Follows with cardiology for management.   Memory loss Assessment & Plan: Could not find any documentation of Mini-Cog or mental status or previous workup for memory loss. Recommend neurology consult for evaluation. Continue donepezil 5 mg at bedtime, if neuro evaluation negative can  discontinue Encouraged daughter to promote independence while maintaining safety  Orders: -     Vitamin B12; Future  Need for pneumococcal 20-valent conjugate vaccination -     Pneumococcal conjugate vaccine 20-valent  Thyromegaly Assessment & Plan: Currently on methimazole 5 mg daily.  MAR indicates methimazole 5 mg 3 times daily.  Patient was recently seen by endocrinology on 07/04/2022.  Plan was to decrease methimazole to 2.5 mg daily. Have asked daughter to check prescription bottle at home and send correct dose Follow-up with endocrinology as scheduled   Microcytosis Assessment & Plan: Low MCV and Hbg.  Mentzer score indicates IDA however Ferritin, TIBC, iron all within normal limits.  No signs of bleeding. Currently taking ferrous sulfate 325  mg daily.  Can decrease to every other day.  Plan to recheck at next visit continues to remain elevated will discontinue iron supplements.   Orders: -     Anemia Profile B; Future  Vitamin D deficiency -     VITAMIN D 25 Hydroxy (Vit-D Deficiency, Fractures); Future  Hyperglycemia -     Hemoglobin A1c; Future  Other orders -     Iron; Take 1 tablet (325 mg total) by mouth every other day.   PDMP reviewed  Return in about 4 months (around 10/31/2022) for annual.  Carollee Leitz, MD

## 2022-07-01 NOTE — Patient Instructions (Signed)
It was a pleasure meeting you today. Thank you for allowing me to take part in your health care.  Our goals for today as we discussed include:  For your Thyroid Follow up with Endocrinology as scheduled Continue Methimazole 5 mg daily.  Please check a bottle on your medications at home as the prescription I have listed is for 3 times daily   For your A-fib Continue Xarelto 20 mg daily Continue metoprolol XL 25 mg daily Continue Lasix 20 mg as needed for swelling and shortness of breath. Follow-up with cardiology as scheduled  For your blood pressure Your blood pressure was slightly elevated today.  Repeat was 90/60 Recommend getting a home monitor that has an upper arm cuff to monitor blood pressure at home.  Will reevaluate this at your next visit.  Continue calcium carbonate with vitamin D. Discontinue vitamin D3 25 mg  Recommend Shingles vaccine.  This is a 2 dose series and can be given at your local pharmacy.  Please talk to your pharmacist about this.   Recommend RSV vaccine.  This is a one-time dose that can be given at your pharmacy.    If you have any questions or concerns, please do not hesitate to call the office at [phone].  I look forward to our next visit and until then take care and stay safe.  Regards,   Carollee Leitz, MD   [facility]

## 2022-07-04 DIAGNOSIS — E042 Nontoxic multinodular goiter: Secondary | ICD-10-CM | POA: Diagnosis not present

## 2022-07-04 DIAGNOSIS — E0590 Thyrotoxicosis, unspecified without thyrotoxic crisis or storm: Secondary | ICD-10-CM | POA: Diagnosis not present

## 2022-07-12 NOTE — Progress Notes (Incomplete)
   SUBJECTIVE:   Chief Complaint  Patient presents with  . Transitions Of Care   HPI Presents to clinic to transfer care.  She is accompanied by her daughter who lives with her.   No acute concerns today  Hypertension Asymptomatic.  Prescribed Metoprolol XL 25 mg daily, Lasix 20 mg daily as needed.  BP as home 120's/60-70's.    Hyperthyroid Asymptomatic.  On Methimazole 5 mg daily. Follows with Endocrinology    PERTINENT PMH / PSH: HTN A Fib on Xarelto CAD Thyromegaly Osteopenia  OBJECTIVE:  BP 90/60   Pulse 66   Temp 97.9 F (36.6 C) (Oral)   Ht '5\' 3"'$  (1.6 m)   Wt 126 lb 3.2 oz (57.2 kg)   SpO2 99%   BMI 22.36 kg/m    Physical Exam Vitals reviewed.  Constitutional:      General: She is not in acute distress.    Appearance: She is not ill-appearing.  HENT:     Head: Normocephalic.     Nose: Nose normal.  Eyes:     Conjunctiva/sclera: Conjunctivae normal.  Cardiovascular:     Rate and Rhythm: Normal rate and regular rhythm.     Heart sounds: Normal heart sounds.  Pulmonary:     Effort: Pulmonary effort is normal.     Breath sounds: Normal breath sounds.  Abdominal:     General: Abdomen is flat. Bowel sounds are normal.     Palpations: Abdomen is soft.  Musculoskeletal:        General: Normal range of motion.     Cervical back: Normal range of motion.  Neurological:     Mental Status: She is alert and oriented to person, place, and time. Mental status is at baseline.  Psychiatric:        Mood and Affect: Mood normal.        Behavior: Behavior normal.        Thought Content: Thought content normal.        Judgment: Judgment normal.     ASSESSMENT/PLAN:  Need for pneumococcal 20-valent conjugate vaccination -     Pneumococcal conjugate vaccine 20-valent  Other orders -     Iron; Take 1 tablet (325 mg total) by mouth every other day.   PDMP reviewed***  Return in about 4 months (around 10/31/2022) for annual.  Carollee Leitz, MD

## 2022-07-13 ENCOUNTER — Encounter: Payer: Self-pay | Admitting: Family Medicine

## 2022-07-13 DIAGNOSIS — E559 Vitamin D deficiency, unspecified: Secondary | ICD-10-CM | POA: Insufficient documentation

## 2022-07-13 DIAGNOSIS — R739 Hyperglycemia, unspecified: Secondary | ICD-10-CM | POA: Insufficient documentation

## 2022-07-13 NOTE — Assessment & Plan Note (Signed)
Rate controlled.  Recent cardiac ablation. Continue Xarelto 20 mg daily Continue metoprolol XL 25 mg daily Follows with cardiology for management.

## 2022-07-13 NOTE — Assessment & Plan Note (Signed)
Not on statin therapy.  LDL less than 100 at goal. Will discuss with patient at next visit to initiate statin.

## 2022-07-13 NOTE — Assessment & Plan Note (Signed)
BP today initially 140/70.  Repeat 90/60.  Asymptomatic. Increase fluids Monitor blood pressure at home Strict return precautions provided

## 2022-07-13 NOTE — Assessment & Plan Note (Signed)
Currently on methimazole 5 mg daily.  MAR indicates methimazole 5 mg 3 times daily.  Patient was recently seen by endocrinology on 07/04/2022.  Plan was to decrease methimazole to 2.5 mg daily. Have asked daughter to check prescription bottle at home and send correct dose Follow-up with endocrinology as scheduled

## 2022-07-13 NOTE — Assessment & Plan Note (Signed)
Low MCV and Hbg.  Mentzer score indicates IDA however Ferritin, TIBC, iron all within normal limits.  No signs of bleeding. Currently taking ferrous sulfate 325 mg daily.  Can decrease to every other day.  Plan to recheck at next visit continues to remain elevated will discontinue iron supplements.

## 2022-07-13 NOTE — Assessment & Plan Note (Signed)
Could not find any documentation of Mini-Cog or mental status or previous workup for memory loss. Recommend neurology consult for evaluation. Continue donepezil 5 mg at bedtime, if neuro evaluation negative can discontinue Encouraged daughter to promote independence while maintaining safety

## 2022-07-15 NOTE — Telephone Encounter (Signed)
Please update MAR to reflect correct dose

## 2022-07-17 ENCOUNTER — Other Ambulatory Visit: Payer: Self-pay

## 2022-07-17 NOTE — Telephone Encounter (Signed)
Noted in the [facility]

## 2022-07-18 ENCOUNTER — Other Ambulatory Visit: Payer: Self-pay | Admitting: *Deleted

## 2022-07-18 DIAGNOSIS — I4819 Other persistent atrial fibrillation: Secondary | ICD-10-CM

## 2022-07-18 MED ORDER — RIVAROXABAN 20 MG PO TABS: 20.0000 mg | ORAL_TABLET | Freq: Every day | ORAL | 5 refills | Status: DC

## 2022-07-18 NOTE — Telephone Encounter (Signed)
Xarelto '20mg'$  refill request received. Pt is 81 years old, weight-57.2kg, Crea-0.56 on 02/27/2022, last seen by Dr. Garen Lah on 02/24/2022, Diagnosis-Afib/flutter, CrCl- 69.94 mL/min; Dose is appropriate based on dosing criteria. Will send in refill to requested pharmacy.

## 2022-07-24 ENCOUNTER — Ambulatory Visit (INDEPENDENT_AMBULATORY_CARE_PROVIDER_SITE_OTHER): Payer: Medicare Other

## 2022-07-24 VITALS — Ht 63.0 in | Wt 126.0 lb

## 2022-07-24 DIAGNOSIS — Z0000 Encounter for general adult medical examination without abnormal findings: Secondary | ICD-10-CM | POA: Diagnosis not present

## 2022-07-24 NOTE — Patient Instructions (Addendum)
Kelli Smith , Thank you for taking time to come for your Medicare Wellness Visit. I appreciate your ongoing commitment to your health goals. Please review the following plan we discussed and let me know if I can assist you in the future.   These are the goals we discussed:  Goals      Follow up with Primary Care Provider     As needed. Stay hydrated.        This is a list of the screening recommended for you and due dates:  Health Maintenance  Topic Date Due   COVID-19 Vaccine (4 - 2023-24 season) 08/09/2022*   Zoster (Shingles) Vaccine (1 of 2) 10/23/2022*   Medicare Annual Wellness Visit  07/25/2023   DTaP/Tdap/Td vaccine (2 - Td or Tdap) 05/21/2031   Pneumonia Vaccine  Completed   Flu Shot  Completed   DEXA scan (bone density measurement)  Completed   HPV Vaccine  Aged Out  *Topic was postponed. The date shown is not the original due date.   Conditions/risks identified: none new  Next appointment: Follow up in one year for your annual wellness visit    Preventive Care 65 Years and Older, Female Preventive care refers to lifestyle choices and visits with your health care provider that can promote health and wellness. What does preventive care include? A yearly physical exam. This is also called an annual well check. Dental exams once or twice a year. Routine eye exams. Ask your health care provider how often you should have your eyes checked. Personal lifestyle choices, including: Daily care of your teeth and gums. Regular physical activity. Eating a healthy diet. Avoiding tobacco and drug use. Limiting alcohol use. Practicing safe sex. Taking low-dose aspirin every day. Taking vitamin and mineral supplements as recommended by your health care provider. What happens during an annual well check? The services and screenings done by your health care provider during your annual well check will depend on your age, overall health, lifestyle risk factors, and family history  of disease. Counseling  Your health care provider may ask you questions about your: Alcohol use. Tobacco use. Drug use. Emotional well-being. Home and relationship well-being. Sexual activity. Eating habits. History of falls. Memory and ability to understand (cognition). Work and work Statistician. Reproductive health. Screening  You may have the following tests or measurements: Height, weight, and BMI. Blood pressure. Lipid and cholesterol levels. These may be checked every 5 years, or more frequently if you are over 65 years old. Skin check. Lung cancer screening. You may have this screening every year starting at age 53 if you have a 30-pack-year history of smoking and currently smoke or have quit within the past 15 years. Fecal occult blood test (FOBT) of the stool. You may have this test every year starting at age 58. Flexible sigmoidoscopy or colonoscopy. You may have a sigmoidoscopy every 5 years or a colonoscopy every 10 years starting at age 44. Hepatitis C blood test. Hepatitis B blood test. Sexually transmitted disease (STD) testing. Diabetes screening. This is done by checking your blood sugar (glucose) after you have not eaten for a while (fasting). You may have this done every 1-3 years. Bone density scan. This is done to screen for osteoporosis. You may have this done starting at age 41. Mammogram. This may be done every 1-2 years. Talk to your health care provider about how often you should have regular mammograms. Talk with your health care provider about your test results, treatment options, and if  necessary, the need for more tests. Vaccines  Your health care provider may recommend certain vaccines, such as: Influenza vaccine. This is recommended every year. Tetanus, diphtheria, and acellular pertussis (Tdap, Td) vaccine. You may need a Td booster every 10 years. Zoster vaccine. You may need this after age 58. Pneumococcal 13-valent conjugate (PCV13) vaccine. One  dose is recommended after age 24. Pneumococcal polysaccharide (PPSV23) vaccine. One dose is recommended after age 11. Talk to your health care provider about which screenings and vaccines you need and how often you need them. This information is not intended to replace advice given to you by your health care provider. Make sure you discuss any questions you have with your health care provider. Document Released: 08/10/2015 Document Revised: 04/02/2016 Document Reviewed: 05/15/2015 Elsevier Interactive Patient Education  2017 [place] Prevention in the Home Falls can cause injuries. They can happen to people of all ages. There are many things you can do to make your home safe and to help prevent falls. What can I do on the outside of my home? Regularly fix the edges of walkways and driveways and fix any cracks. Remove anything that might make you trip as you walk through a door, such as a raised step or threshold. Trim any bushes or trees on the path to your home. Use bright outdoor lighting. Clear any walking paths of anything that might make someone trip, such as rocks or tools. Regularly check to see if handrails are loose or broken. Make sure that both sides of any steps have handrails. Any raised decks and porches should have guardrails on the edges. Have any leaves, snow, or ice cleared regularly. Use sand or salt on walking paths during winter. Clean up any spills in your garage right away. This includes oil or grease spills. What can I do in the bathroom? Use night lights. Install grab bars by the toilet and in the tub and shower. Do not use towel bars as grab bars. Use non-skid mats or decals in the tub or shower. If you need to sit down in the shower, use a plastic, non-slip stool. Keep the floor dry. Clean up any water that spills on the floor as soon as it happens. Remove soap buildup in the tub or shower regularly. Attach bath mats securely with double-sided  non-slip rug tape. Do not have throw rugs and other things on the floor that can make you trip. What can I do in the bedroom? Use night lights. Make sure that you have a light by your bed that is easy to reach. Do not use any sheets or blankets that are too big for your bed. They should not hang down onto the floor. Have a firm chair that has side arms. You can use this for support while you get dressed. Do not have throw rugs and other things on the floor that can make you trip. What can I do in the kitchen? Clean up any spills right away. Avoid walking on wet floors. Keep items that you use a lot in easy-to-reach places. If you need to reach something above you, use a strong step stool that has a grab bar. Keep electrical cords out of the way. Do not use floor polish or wax that makes floors slippery. If you must use wax, use non-skid floor wax. Do not have throw rugs and other things on the floor that can make you trip. What can I do with my stairs? Do not leave any items  on the stairs. Make sure that there are handrails on both sides of the stairs and use them. Fix handrails that are broken or loose. Make sure that handrails are as long as the stairways. Check any carpeting to make sure that it is firmly attached to the stairs. Fix any carpet that is loose or worn. Avoid having throw rugs at the top or bottom of the stairs. If you do have throw rugs, attach them to the floor with carpet tape. Make sure that you have a light switch at the top of the stairs and the bottom of the stairs. If you do not have them, ask someone to add them for you. What else can I do to help prevent falls? Wear shoes that: Do not have high heels. Have rubber bottoms. Are comfortable and fit you well. Are closed at the toe. Do not wear sandals. If you use a stepladder: Make sure that it is fully opened. Do not climb a closed stepladder. Make sure that both sides of the stepladder are locked into place. Ask  someone to hold it for you, if possible. Clearly mark and make sure that you can see: Any grab bars or handrails. First and last steps. Where the edge of each step is. Use tools that help you move around (mobility aids) if they are needed. These include: Canes. Walkers. Scooters. Crutches. Turn on the lights when you go into a dark area. Replace any light bulbs as soon as they burn out. Set up your furniture so you have a clear path. Avoid moving your furniture around. If any of your floors are uneven, fix them. If there are any pets around you, be aware of where they are. Review your medicines with your doctor. Some medicines can make you feel dizzy. This can increase your chance of falling. Ask your doctor what other things that you can do to help prevent falls. This information is not intended to replace advice given to you by your health care provider. Make sure you discuss any questions you have with your health care provider. Document Released: 05/10/2009 Document Revised: 12/20/2015 Document Reviewed: 08/18/2014 Elsevier Interactive Patient Education  2017 [employer].

## 2022-07-24 NOTE — Progress Notes (Signed)
Subjective:   Kelli Smith is a 81 y.o. female who presents for Medicare Annual (Subsequent) preventive examination.  Review of Systems    No ROS.  Medicare Wellness Virtual Visit.  Visual/audio telehealth visit, UTA vital signs.   See social history for additional risk factors.   Cardiac Risk Factors include: advanced age (>54mn, >>32women)     Objective:    Today's Vitals   07/24/22 1627  Weight: 126 lb (57.2 kg)  Height: '5\' 3"'$  (1.6 m)   Body mass index is 22.32 kg/m.     07/24/2022    3:40 PM 10/28/2021    9:47 AM 10/14/2021    1:10 PM 12/12/2020   11:19 AM 11/30/2020    2:42 PM 05/28/2020    3:17 PM 05/26/2019    1:14 PM  Advanced Directives  Does Patient Have a Medical Advance Directive? No No No No No No No  Does patient want to make changes to medical advance directive?       No - Patient declined  Would patient like information on creating a medical advance directive? No - Patient declined No - Patient declined  No - Patient declined  No - Patient declined     Current Medications (verified) Outpatient Encounter Medications as of 07/24/2022  Medication Sig   acetaminophen (TYLENOL) 650 MG CR tablet Take 1,300 mg by mouth every 8 (eight) hours as needed for pain.   Ascorbic Acid (VITAMIN C PO) Take by mouth daily.   Calcium Carbonate-Vit D-Min (CALCIUM 600+D3 PLUS MINERALS PO) Take 1,200 mg by mouth daily.   donepezil (ARICEPT) 5 MG tablet Take 1 tablet (5 mg total) by mouth at bedtime.   Ferrous Sulfate (IRON) 325 (65 Fe) MG TABS Take 1 tablet (325 mg total) by mouth every other day.   furosemide (LASIX) 20 MG tablet Take 1 tablet (20 mg total) by mouth daily as needed. For swelling and shortness of breath.   methimazole (TAPAZOLE) 5 MG tablet Take 2.5 mg by mouth daily. Patient takes 2.5 mg   metoprolol succinate (TOPROL XL) 25 MG 24 hr tablet Take 1 tablet (25 mg total) by mouth daily.   Multiple Vitamins-Minerals (MULTIVITAMIN WITH MINERALS) tablet Take 1  tablet by mouth daily.   rivaroxaban (XARELTO) 20 MG TABS tablet Take 1 tablet (20 mg total) by mouth daily with supper.   No facility-administered encounter medications on file as of 07/24/2022.    Allergies (verified) Patient has no known allergies.   History: Past Medical History:  Diagnosis Date   A-fib ([place]    a.) CHA2DS2-VASc = 5 (age x 2, sex, HTN, aortic plaque). b.) s/p DCCV (200J x 2) 11/07/2020. c.) rate/rhythm maintained on oral amiodarone; chronically anticoagulated using rivaroxaban.   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/2020   B12 deficiency    Basal cell carcinoma    CAD (coronary artery disease)    CAD (coronary artery disease) 11/22/2020   Cyst of right kidney    DDD (degenerative disc disease), lumbar    Hematuria 04/05/2020   History of chicken pox    History of kidney stones    HTN (hypertension)    Knee pain, bilateral 06/11/2018   Long term current use of anticoagulant    a.) rivaroxaban   Memory loss    Microcytosis  Osteopenia    PAC (premature atrial contraction)    Pain of right heel 04/05/2020   Premature atrial contraction 09/20/2019   Primary osteoarthritis of both knees 09/30/2020   Right shoulder pain 06/11/2018   Thyromegaly    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   Past Surgical History:  Procedure Laterality Date   BUNIONECTOMY WITH HAMMERTOE RECONSTRUCTION  2009   CARDIOVERSION N/A 11/07/2020   Procedure: CARDIOVERSION (200J x 2); Location: [place]; Surgeon: Kate Sable, MD   CATARACT EXTRACTION Bilateral    2013/2014   CESAREAN SECTION N/A 1978   KNEE ARTHROPLASTY Left 12/12/2020   Procedure: COMPUTER ASSISTED TOTAL KNEE ARTHROPLASTY;  Surgeon: Dereck Leep, MD;  Location: [facility];  Service: Orthopedics;  Laterality: Left;   KNEE ARTHROPLASTY Right 10/28/2021   Procedure:  COMPUTER ASSISTED TOTAL KNEE ARTHROPLASTY;  Surgeon: Dereck Leep, MD;  Location: [facility];  Service: Orthopedics;  Laterality: Right;   TEE WITHOUT CARDIOVERSION N/A 11/07/2020   Procedure: TRANSESOPHAGEAL ECHOCARDIOGRAM (TEE);  Surgeon: Kate Sable, MD;  Location: [facility];  Service: Cardiovascular;  Laterality: N/A;   Family History  Problem Relation Age of Onset   Heart disease Mother        died when pt was 8 y.o    Heart Problems Mother    Heart disease Father    Alzheimer's disease Sister    Social History   Socioeconomic History   Marital status: Widowed    Spouse name: Not on file   Number of children: Not on file   Years of education: Not on file   Highest education level: Not on file  Occupational History   Not on file  Tobacco Use   Smoking status: Never   Smokeless tobacco: Never  Vaping Use   Vaping Use: Never used  Substance and Sexual Activity   Alcohol use: Not Currently   Drug use: Never   Sexual activity: Not on file  Other Topics Concern   Not on file  Social History Narrative   From [place] lived in [place] since late 1990s early 2000    Lives with daughter    Secretary/administrator ed    Former Pharmacist, hospital    No guns, wears seat belt, safe in relationship    Widowed       2 daughters 1/2 in [place] Determinants of Health   Financial Resource Strain: [place]  (07/24/2022)   Overall Financial Resource Strain (CARDIA)    Difficulty of Paying Living Expenses: Not hard at all  Food Insecurity: No Food Insecurity (07/24/2022)   Hunger Vital Sign    Worried About Running Out of Food in the Last Year: Never true    [place] in the Last Year: Never true  Transportation Needs: No Transportation Needs (07/24/2022)   PRAPARE - Hydrologist (Medical): No    Lack of Transportation (Non-Medical): No  Physical Activity: Insufficiently Active (07/24/2022)   Exercise Vital Sign    Days of Exercise per Week: 2 days     Minutes of Exercise per Session: 50 min  Stress: No Stress Concern Present (07/24/2022)   [place]    Feeling of Stress : Not at all  Social Connections: Unknown (07/24/2022)   Social Connection and Isolation Panel [NHANES]    Frequency of Communication with Friends and Family: Twice a week    Frequency of Social Gatherings with Friends and Family: Once a  week    Attends Religious Services: Not on file    Active Member of Clubs or Organizations: No    Attends Archivist Meetings: Patient refused    Marital Status: Widowed    Tobacco Counseling Counseling given: Not Answered   Clinical Intake:  Pre-visit preparation completed: Yes           How often do you need to have someone help you when you read instructions, pamphlets, or other written materials from your doctor or pharmacy?: 3 - Sometimes    Interpreter Needed?: No      Activities of Daily Living    07/24/2022    3:33 PM 07/24/2022   12:18 PM  In your present state of health, do you have any difficulty performing the following activities:  Hearing? 0 0  Vision? 0 0  Difficulty concentrating or making decisions?  0  Comment Taking medication Aricept as directed   Walking or climbing stairs? 0 0  Dressing or bathing? 0 0  Doing errands, shopping? 1 1  Comment Family Diplomatic Services operational officer and eating ? N N  Comment daughter assist with meal prep. Self feeds.   Using the Toilet? N N  In the past six months, have you accidently leaked urine? N N  Do you have problems with loss of bowel control? N N  Managing your Medications? Tempie Donning  Comment daughter assist   Managing your Finances? Tempie Donning  Comment daughter assist   Housekeeping or managing your Housekeeping? Dorothey Baseman  Comment daughter assist     Patient Care Team: McLean-Scocuzza, Nino Glow, MD as PCP - General (Internal Medicine) Kate Sable, MD as PCP - Cardiology  (Cardiology) Vickie Epley, MD as PCP - Electrophysiology (Cardiology)  Indicate any recent Medical Services you may have received from other than [facility] providers in the past year (date may be approximate).     Assessment:   This is a routine wellness examination for [facility]  I connected with  [name] on 07/24/22 by a audio enabled telemedicine application and verified that I am speaking with the correct person using two identifiers.  Patient Location: Home  Provider Location: Office/Clinic  I discussed the limitations of evaluation and management by telemedicine. The patient expressed understanding and agreed to proceed.   Hearing/Vision screen Hearing Screening - Comments:: Patient is able to hear conversational tones without difficulty. No issues reported. Vision Screening - Comments:: Followed by [facility], Dr. Kerin Ransom  Wears glasses  Dietary issues and exercise activities discussed: Current Exercise Habits: Home exercise routine, Type of exercise: calisthenics (stationary bike), Intensity: Mild   Goals Addressed             This Visit's Progress    Follow up with Primary Care Provider       As needed. Stay hydrated.       Depression Screen    07/24/2022    3:40 PM 07/01/2022    9:05 AM 02/27/2022    1:41 PM 06/04/2021    2:26 PM 05/28/2020    3:13 PM 04/05/2020    8:07 AM 05/26/2019    1:19 PM  PHQ 2/9 Scores  PHQ - 2 Score 0 0 0 0 0 0 0    Fall Risk    07/24/2022    3:39 PM 07/24/2022   12:18 PM 07/01/2022    9:05 AM 02/27/2022    1:41 PM 06/04/2021    2:25 PM  Fall Risk   Falls in  the past year? 0 0 0 0 0  Number falls in past yr:   0  0  Injury with Fall?  0 0    Risk for fall due to :   No Fall Risks    Follow up   Falls evaluation completed  Falls evaluation completed    [place]: Home free of loose throw rugs in walkways, pet beds, electrical cords, etc? Yes  Adequate lighting in your home to reduce  risk of falls? Yes   ASSISTIVE DEVICES UTILIZED TO PREVENT FALLS: Life alert? No  Use of a cane, walker or w/c? No   TIMED UP AND GO: Was the test performed? No .   Cognitive Function:  Patient is alert and oriented x3.  Taking medication as directed.       07/24/2022    4:25 PM 06/04/2021    2:32 PM 05/28/2020    3:21 PM  6CIT Screen  What Year? 0 points 0 points 0 points  What month? 0 points 0 points 0 points  What time? 0 points 0 points   Count back from 20  0 points   Months in reverse  0 points 0 points  Repeat phrase  10 points 10 points  Total Score  10 points     Immunizations Immunization History  Administered Date(s) Administered   Fluad Quad(high Dose 65+) 05/26/2019   Influenza, High Dose Seasonal PF 04/22/2018, 04/21/2020   Influenza-Unspecified 05/20/2021, 05/17/2022   PFIZER(Purple Top)SARS-COV-2 Vaccination 09/12/2019, 10/03/2019, 08/07/2020   PNEUMOCOCCAL CONJUGATE-20 07/01/2022   Pneumococcal Conjugate-13 04/22/2018   Pneumococcal Polysaccharide-23 06/17/2019   Tdap 05/20/2021   Shingrix Completed?: No.    Education has been provided regarding the importance of this vaccine. Patient has been advised to call insurance company to determine out of pocket expense if they have not yet received this vaccine. Advised may also receive vaccine at local pharmacy or Health Dept. Verbalized acceptance and understanding.  Screening Tests Health Maintenance  Topic Date Due   COVID-19 Vaccine (4 - 2023-24 season) 08/09/2022 (Originally 03/28/2022)   Zoster Vaccines- Shingrix (1 of 2) 10/23/2022 (Originally 06/18/1960)   Medicare Annual Wellness (AWV)  07/25/2023   DTaP/Tdap/Td (2 - Td or Tdap) 05/21/2031   Pneumonia Vaccine 76+ Years old  Completed   INFLUENZA VACCINE  Completed   DEXA SCAN  Completed   HPV VACCINES  Aged Out   Health Maintenance There are no preventive care reminders to display for this patient.  Lung Cancer Screening: (Low Dose CT Chest  recommended if Age 43-80 years, 30 pack-year currently smoking OR have quit w/in 15years.) does not qualify.   Hepatitis C Screening: does not qualify.  Vision Screening: Recommended annual ophthalmology exams for early detection of glaucoma and other disorders of the eye.  Dental Screening: Recommended annual dental exams for proper oral hygiene.  Community Resource Referral / Chronic Care Management: CRR required this visit?  No   CCM required this visit?  No      Plan:     I have personally reviewed and noted the following in the patient's chart:   Medical and social history Use of alcohol, tobacco or illicit drugs  Current medications and supplements including opioid prescriptions. Patient is not currently taking opioid prescriptions. Functional ability and status Nutritional status Physical activity Advanced directives List of other physicians Hospitalizations, surgeries, and ER visits in previous 12 months Vitals Screenings to include cognitive, depression, and falls Referrals and appointments  In  addition, I have reviewed and discussed with patient certain preventive protocols, quality metrics, and best practice recommendations. A written personalized care plan for preventive services as well as general preventive health recommendations were provided to patient.     Leta Jungling, LPN   [id number]

## 2022-08-11 ENCOUNTER — Other Ambulatory Visit: Payer: Self-pay

## 2022-08-11 MED ORDER — METOPROLOL SUCCINATE ER 25 MG PO TB24: 25.0000 mg | ORAL_TABLET | Freq: Every day | ORAL | 0 refills | Status: DC

## 2022-08-28 ENCOUNTER — Ambulatory Visit: Payer: Medicare Other | Admitting: Podiatry

## 2022-08-29 ENCOUNTER — Encounter: Payer: Self-pay | Admitting: Physician Assistant

## 2022-08-29 ENCOUNTER — Ambulatory Visit: Payer: Medicare Other | Attending: Physician Assistant | Admitting: Cardiology

## 2022-08-29 VITALS — BP 110/60 | HR 106 | Ht 63.0 in | Wt 125.4 lb

## 2022-08-29 DIAGNOSIS — R946 Abnormal results of thyroid function studies: Secondary | ICD-10-CM

## 2022-08-29 DIAGNOSIS — I10 Essential (primary) hypertension: Secondary | ICD-10-CM

## 2022-08-29 DIAGNOSIS — I4811 Longstanding persistent atrial fibrillation: Secondary | ICD-10-CM

## 2022-08-29 NOTE — Progress Notes (Signed)
Cardiology Office Note:    Date:  08/29/2022   ID:  Kelli Smith, DOB 1941/02/02, MRN [id number]  PCP:  McLean-Scocuzza, Kelli Glow, MD (Inactive)   [place] Providers Cardiologist:  Kelli Sable, MD Electrophysiologist:  Kelli Epley, MD     Referring MD: No ref. provider found   Chief Complaint  Patient presents with   6 month follow up     "Doing well." Medications reviewed by the patient verballyl.     History of Present Illness:    Kelli Smith is a 82 y.o. female with a hx of atrial fibrillation (s/p DCCV 11/07/20), aortic atherosclerosis, HTN.  Initially presented to our office in 2021 after undergoing a CT scan for kidney stones, a pericardial effusion was present and she was sent for echo for further evaluation. The echo revealed she was in atrial fibrillation and she was sent to our office for further evaluation. She wore a monitor for 2 weeks, this showed frequent PACs, atrial tachycardia, and occasional SVT. In April 2022, she was evaluated by her PCP and was noted to be in AF/AT during a preop visit. She returned to our office, Eliquis and metoprolol were started. Successful DCCV 11/07/20, amiodarone was started post-procedure, and she was changed to Xarelto as Eliquis was cost prohibitive. Amiodarone was eventually stopped d/t abnormal thyroid function.   Last seen in the office by Dr. Garen Smith, at that time she was doing well from a cardiac perspective.   She presents today accompanied by her daughter. She reports she is doing well and "never feels bad". She ran out of her metoprolol, took her last dose on 08/27/22, tachycardiac upon evaluation, 12 lead shows ST with frequent PACs. They plan to pick up her refill after OV today, reiterated importance of picking up and resuming ASAP. They follow closely with Dr. Honor Smith for her thyroid dysfunction, saw him in December 2023, returns again for labs in a few weeks. She denies chest pain, palpitations,  dyspnea, pnd, orthopnea, n, v, dizziness, syncope, edema, weight gain, or early satiety.   Past Medical History:  Diagnosis Date   A-fib [facility])    a.) CHA2DS2-VASc = 5 (age x 2, sex, HTN, aortic plaque). b.) s/p DCCV (200J x 2) 11/07/2020. c.) rate/rhythm maintained on oral amiodarone; chronically anticoagulated using rivaroxaban.   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/2020   B12 deficiency    Basal cell carcinoma    CAD (coronary artery disease)    CAD (coronary artery disease) 11/22/2020   Cyst of right kidney    DDD (degenerative disc disease), lumbar    Hematuria 04/05/2020   History of chicken pox    History of kidney stones    HTN (hypertension)    Knee pain, bilateral 06/11/2018   Long term current use of anticoagulant    a.) rivaroxaban   Memory loss    Microcytosis    Osteopenia    PAC (premature atrial contraction)    Pain of right heel 04/05/2020   Premature atrial contraction 09/20/2019   Primary osteoarthritis of both knees 09/30/2020   Right shoulder pain 06/11/2018   Thyromegaly    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    Past Surgical History:  Procedure Laterality Date   BUNIONECTOMY WITH HAMMERTOE RECONSTRUCTION  2009   CARDIOVERSION N/A 11/07/2020   Procedure:  CARDIOVERSION (200J x 2); Location: [place]; Surgeon: Kelli Sable, MD   CATARACT EXTRACTION Bilateral    2013/2014   CESAREAN SECTION N/A 1978   KNEE ARTHROPLASTY Left 12/12/2020   Procedure: COMPUTER ASSISTED TOTAL KNEE ARTHROPLASTY;  Surgeon: Kelli Leep, MD;  Location: [facility];  Service: Orthopedics;  Laterality: Left;   KNEE ARTHROPLASTY Right 10/28/2021   Procedure: COMPUTER ASSISTED TOTAL KNEE ARTHROPLASTY;  Surgeon: Kelli Leep, MD;  Location: [facility] OR[facility];  Service: Orthopedics;  Laterality: Right;   TEE WITHOUT CARDIOVERSION N/A 11/07/2020    Procedure: TRANSESOPHAGEAL ECHOCARDIOGRAM (TEE);  Surgeon: Kelli Sable, MD;  Location: [facility];  Service: Cardiovascular;  Laterality: N/A;    Current Medications: Current Meds  Medication Sig   acetaminophen (TYLENOL) 650 MG CR tablet Take 1,300 mg by mouth every 8 (eight) hours as needed for pain.   Ascorbic Acid (VITAMIN C PO) Take by mouth daily.   Calcium Carbonate-Vit D-Min (CALCIUM 600+D3 PLUS MINERALS PO) Take 1,200 mg by mouth daily.   donepezil (ARICEPT) 5 MG tablet Take 1 tablet (5 mg total) by mouth at bedtime.   Ferrous Sulfate (IRON) 325 (65 Fe) MG TABS Take 1 tablet (325 mg total) by mouth every other day.   furosemide (LASIX) 20 MG tablet Take 1 tablet (20 mg total) by mouth daily as needed. For swelling and shortness of breath.   methimazole (TAPAZOLE) 5 MG tablet Take 2.5 mg by mouth daily. Patient takes 2.5 mg   metoprolol succinate (TOPROL XL) 25 MG 24 hr tablet Take 1 tablet (25 mg total) by mouth daily. PLEASE CALL TO SCHEDULE OFFICE VISIT FOR FURTHER REFILLS. THANK YOU!   Multiple Vitamins-Minerals (MULTIVITAMIN WITH MINERALS) tablet Take 1 tablet by mouth daily.   rivaroxaban (XARELTO) 20 MG TABS tablet Take 1 tablet (20 mg total) by mouth daily with supper.     Allergies:   Patient has no known allergies.   Social History   Socioeconomic History   Marital status: Widowed    Spouse name: Not on file   Number of children: Not on file   Years of education: Not on file   Highest education level: Not on file  Occupational History   Not on file  Tobacco Use   Smoking status: Never   Smokeless tobacco: Never  Vaping Use   Vaping Use: Never used  Substance and Sexual Activity   Alcohol use: Not Currently   Drug use: Never   Sexual activity: Not on file  Other Topics Concern   Not on file  Social History Narrative   From [place] lived in [place] since late 1990s early 2000    Lives with daughter    Secretary/administrator ed    Former Pharmacist, hospital    No guns, wears seat  belt, safe in relationship    Widowed       2 daughters 1/2 in [place] Determinants of Health   Financial Resource Strain: [place]  (07/24/2022)   Overall Financial Resource Strain (CARDIA)    Difficulty of Paying Living Expenses: Not hard at all  Food Insecurity: No Food Insecurity (07/24/2022)   Hunger Vital Sign    Worried About Running Out of Food in the Last Year: Never true    [place] in the Last Year: Never true  Transportation Needs: No Transportation Needs (07/24/2022)   PRAPARE - Hydrologist (Medical): No    Lack of Transportation (Non-Medical): No  Physical Activity: Insufficiently  Active (07/24/2022)   Exercise Vital Sign    Days of Exercise per Week: 2 days    Minutes of Exercise per Session: 50 min  Stress: No Stress Concern Present (07/24/2022)   [place]    Feeling of Stress : Not at all  Social Connections: Unknown (07/24/2022)   Social Connection and Isolation Panel [NHANES]    Frequency of Communication with Friends and Family: Twice a week    Frequency of Social Gatherings with Friends and Family: Once a week    Attends Religious Services: Not on Diplomatic Services operational officer of Clubs or Organizations: No    Attends Archivist Meetings: Patient refused    Marital Status: Widowed     Family History: The patient's family history includes Alzheimer's disease in her sister; Heart Problems in her mother; Heart disease in her father and mother.  ROS:   Please see the history of present illness.    All other systems reviewed and are negative.  EKGs/Labs/Other Studies Reviewed:    The following studies were reviewed today:  11/07/20 echo TEE - EF 50%, LV has low normal function, LA moderately dilated. Mild > mod MR. Trivial AR.   EKG:  EKG is ordered today.  The ekg ordered today demonstrates sinus tachycardia with PACs, HR 106, rebound  tachycardia from missing her BB x 2 days.   Recent Labs: 02/27/2022: ALT 10; BUN 23; Creatinine, Ser 0.56; Hemoglobin 11.5; Platelets 228.0; Potassium 4.0; Sodium 139 06/18/2022: TSH 0.820  Recent Lipid Panel    Component Value Date/Time   CHOL 160 10/21/2021 0747   TRIG 69.0 10/21/2021 0747   HDL 74.10 10/21/2021 0747   CHOLHDL 2 10/21/2021 0747   VLDL 13.8 10/21/2021 0747   LDLCALC 72 10/21/2021 0747     Risk Assessment/Calculations:    CHA2DS2-VASc Score = 5   This indicates a 7.2% annual risk of stroke. The patient's score is based upon: CHF History: 0 HTN History: 1 Diabetes History: 0 Stroke History: 0 Vascular Disease History: 1 Age Score: 2 Gender Score: 1               Physical Exam:    VS:  BP 110/60 (BP Location: Left Arm, Patient Position: Sitting, Cuff Size: Normal)   Pulse (!) 106   Ht '5\' 3"'$  (1.6 m)   Wt 125 lb 6 oz (56.9 kg)   SpO2 95%   BMI 22.21 kg/m     Wt Readings from Last 3 Encounters:  08/29/22 125 lb 6 oz (56.9 kg)  07/24/22 126 lb (57.2 kg)  07/01/22 126 lb 3.2 oz (57.2 kg)     GEN:  Well nourished, well developed in no acute distress HEENT: Normal NECK: No JVD; No carotid bruits LYMPHATICS: No lymphadenopathy CARDIAC: RRR, + murmur, rubs, gallops RESPIRATORY:  Clear to auscultation without rales, wheezing or rhonchi  ABDOMEN: Soft, non-tender, non-distended MUSCULOSKELETAL:  No edema; No deformity  SKIN: Warm and dry NEUROLOGIC:  Alert and oriented x 3 PSYCHIATRIC:  Normal affect   ASSESSMENT:    1. Longstanding persistent atrial fibrillation ([facility])   2. Hypertension, unspecified type    3. Abnormal thyroid function test    PLAN:    In order of problems listed above:  1.  Atrial fibrillation - s/p DCCV 10/2020, sinus tachycardia with frequent PACs today (rebound tachycardia), has been out of her metoprolol x 2 days, they plan to pick up refill after OV today.  Encouraged to avoid running out. On Xarelto (CrCl 69, on correct  dose). Continue metoprolol 25 mg daily, continue Xarelto 20 mg daily. CHA2DS2-VASc Score = 5 [CHF History: 0, HTN History: 1, Diabetes History: 0, Stroke History: 0, Vascular Disease History: 1, Age Score: 2, Gender Score: 1].  Therefore, the patient's annual risk of stroke is 7.2 %.     2.  HTN - BP today 110/60 well controlled, continue metoprolol 25 mg daily. 3.  Hyperthyroidism - thyroid function normalized on recent test, followed and managed by endocrinology, they are weaning her methimazole, she has repeat labs in the next few weeks and will return to see them in May.  Disposition - pick up metoprolol and resume today (inadvertently ran out), return in 6 months.            Medication Adjustments/Labs and Tests Ordered: Current medicines are reviewed at length with the patient today.  Concerns regarding medicines are outlined above.  Orders Placed This Encounter  Procedures   EKG 12-Lead   No orders of the defined types were placed in this encounter.   Patient Instructions  Medication Instructions:  No changes at this time.   *If you need a refill on your cardiac medications before your next appointment, please call your pharmacy*   Lab Work: None  If you have labs (blood work) drawn today and your tests are completely normal, you will receive your results only by: [place] (if you have MyChart) OR A paper copy in the mail If you have any lab test that is abnormal or we need to change your treatment, we will call you to review the results.   Testing/Procedures: None   Follow-Up: At [facility], you and your health needs are our priority.  As part of our continuing mission to provide you with exceptional heart care, we have created designated Provider Care Teams.  These Care Teams include your primary Cardiologist (physician) and Advanced Practice Providers (APPs -  Physician Assistants and Nurse Practitioners) who all work together to provide you with  the care you need, when you need it.   Your next appointment:   6 month(s)  Provider:   Kate Sable, MD or Christell Faith, PA-C       Signed, Trudi Ida, NP  08/29/2022 10:28 AM    [place]

## 2022-08-29 NOTE — Patient Instructions (Signed)
Medication Instructions:  No changes at this time.   *If you need a refill on your cardiac medications before your next appointment, please call your pharmacy*   Lab Work: None  If you have labs (blood work) drawn today and your tests are completely normal, you will receive your results only by: [place] (if you have MyChart) OR A paper copy in the mail If you have any lab test that is abnormal or we need to change your treatment, we will call you to review the results.   Testing/Procedures: None   Follow-Up: At [facility], you and your health needs are our priority.  As part of our continuing mission to provide you with exceptional heart care, we have created designated Provider Care Teams.  These Care Teams include your primary Cardiologist (physician) and Advanced Practice Providers (APPs -  Physician Assistants and Nurse Practitioners) who all work together to provide you with the care you need, when you need it.   Your next appointment:   6 month(s)  Provider:   Kate Sable, MD or Christell Faith, PA-C

## 2022-09-04 ENCOUNTER — Ambulatory Visit: Payer: Medicare Other | Admitting: Podiatry

## 2022-09-15 ENCOUNTER — Ambulatory Visit (INDEPENDENT_AMBULATORY_CARE_PROVIDER_SITE_OTHER): Payer: Medicare Other | Admitting: Podiatry

## 2022-09-15 ENCOUNTER — Encounter: Payer: Self-pay | Admitting: Podiatry

## 2022-09-15 VITALS — BP 154/61 | HR 66

## 2022-09-15 DIAGNOSIS — M79674 Pain in right toe(s): Secondary | ICD-10-CM

## 2022-09-15 DIAGNOSIS — B351 Tinea unguium: Secondary | ICD-10-CM | POA: Diagnosis not present

## 2022-09-15 DIAGNOSIS — E0590 Thyrotoxicosis, unspecified without thyrotoxic crisis or storm: Secondary | ICD-10-CM | POA: Diagnosis not present

## 2022-09-15 DIAGNOSIS — M79675 Pain in left toe(s): Secondary | ICD-10-CM

## 2022-09-16 ENCOUNTER — Encounter: Payer: Self-pay | Admitting: Podiatry

## 2022-09-16 NOTE — Progress Notes (Signed)
  Subjective:  Patient ID: Kelli Smith, female    DOB: 1940/08/18,  MRN: [id number]  Chief Complaint  Patient presents with   Nail Problem    "Cut my nails."    82 y.o. female presents with the above complaint. History confirmed with patient.  Her nails are thickened and elongated and causing discomfort.  Objective:  Physical Exam: warm, good capillary refill, no trophic changes or ulcerative lesions, normal DP and PT pulses, and normal sensory exam. Left Foot: dystrophic yellowed discolored nail plates with subungual debris Right Foot: dystrophic yellowed discolored nail plates with subungual debris   Assessment:   1. Pain due to onychomycosis of toenails of both feet      Plan:  Patient was evaluated and treated and all questions answered.  Discussed the etiology and treatment options for the condition in detail with the patient. Educated patient on the topical and oral treatment options for mycotic nails. Recommended debridement of the nails today. Sharp and mechanical debridement performed of all painful and mycotic nails today. Nails debrided in length and thickness using a nail nipper to level of comfort. Discussed treatment options including appropriate shoe gear. Follow up as needed for painful nails.    Return in about 3 months (around 12/14/2022) for painful thick fungal nails, calluses.

## 2022-09-22 ENCOUNTER — Other Ambulatory Visit: Payer: Self-pay

## 2022-09-22 MED ORDER — METOPROLOL SUCCINATE ER 25 MG PO TB24: 25.0000 mg | ORAL_TABLET | Freq: Every day | ORAL | 2 refills | Status: DC

## 2022-10-28 DIAGNOSIS — I700 Atherosclerosis of aorta: Secondary | ICD-10-CM | POA: Diagnosis not present

## 2022-10-28 DIAGNOSIS — Z96651 Presence of right artificial knee joint: Secondary | ICD-10-CM | POA: Diagnosis not present

## 2022-10-29 DIAGNOSIS — N390 Urinary tract infection, site not specified: Secondary | ICD-10-CM | POA: Insufficient documentation

## 2022-11-02 NOTE — Progress Notes (Signed)
Patient was not seen in clinic. Appointment rescheduled

## 2022-11-02 NOTE — Patient Instructions (Signed)
It was a pleasure meeting you today. Thank you for allowing me to take part in your health care.  Our goals for today as we discussed include:  Will check your labs today.  Recommend Shingles vaccine.  This is a 2 dose series and can be given at your local pharmacy.  Please talk to your pharmacist about this.   Please arrive 15 minutes prior to your appointment.  Arrivals 5 minutes past your appointment time will need to be rescheduled.  This is to ensure that all patients are seen in a timely manner.  Thank you for your understanding and cooperation.   If you have any questions or concerns, please do not hesitate to call the office at [phone].  I look forward to our next visit and until then take care and stay safe.  Regards,   Dana Allan, MD   [facility]

## 2022-11-03 ENCOUNTER — Ambulatory Visit (INDEPENDENT_AMBULATORY_CARE_PROVIDER_SITE_OTHER): Payer: Medicare Other | Admitting: Family Medicine

## 2022-11-03 ENCOUNTER — Other Ambulatory Visit (INDEPENDENT_AMBULATORY_CARE_PROVIDER_SITE_OTHER): Payer: Medicare Other

## 2022-11-03 DIAGNOSIS — I700 Atherosclerosis of aorta: Secondary | ICD-10-CM

## 2022-11-03 DIAGNOSIS — R718 Other abnormality of red blood cells: Secondary | ICD-10-CM

## 2022-11-03 DIAGNOSIS — R739 Hyperglycemia, unspecified: Secondary | ICD-10-CM | POA: Diagnosis not present

## 2022-11-03 DIAGNOSIS — I10 Essential (primary) hypertension: Secondary | ICD-10-CM

## 2022-11-03 DIAGNOSIS — R413 Other amnesia: Secondary | ICD-10-CM | POA: Diagnosis not present

## 2022-11-03 DIAGNOSIS — Z538 Procedure and treatment not carried out for other reasons: Secondary | ICD-10-CM

## 2022-11-03 DIAGNOSIS — E559 Vitamin D deficiency, unspecified: Secondary | ICD-10-CM

## 2022-11-03 LAB — COMPREHENSIVE METABOLIC PANEL
ALT: 17 U/L (ref 0–35)
AST: 19 U/L (ref 0–37)
Albumin: 4.1 g/dL (ref 3.5–5.2)
Alkaline Phosphatase: 115 U/L (ref 39–117)
BUN: 25 mg/dL — ABNORMAL HIGH (ref 6–23)
CO2: 26 mEq/L (ref 19–32)
Calcium: 9.8 mg/dL (ref 8.4–10.5)
Chloride: 103 mEq/L (ref 96–112)
Creatinine, Ser: 0.78 mg/dL (ref 0.40–1.20)
GFR: 70.75 mL/min (ref >60.00)
Glucose, Bld: 90 mg/dL (ref 70–99)
Potassium: 4 mEq/L (ref 3.5–5.1)
Sodium: 139 mEq/L (ref 135–145)
Total Bilirubin: 0.6 mg/dL (ref 0.2–1.2)
Total Protein: 6.9 g/dL (ref 6.0–8.3)

## 2022-11-03 LAB — LIPID PANEL
Cholesterol: 159 mg/dL (ref 0–200)
HDL: 56.2 mg/dL (ref >39.00)
LDL Cholesterol: 86 mg/dL (ref 0–99)
NonHDL: 102.3
Total CHOL/HDL Ratio: 3
Triglycerides: 80 mg/dL (ref 0.0–149.0)
VLDL: 16 mg/dL (ref 0.0–40.0)

## 2022-11-03 LAB — HEMOGLOBIN A1C: Hgb A1c MFr Bld: 5.5 % (ref 4.6–6.5)

## 2022-11-03 LAB — VITAMIN D 25 HYDROXY (VIT D DEFICIENCY, FRACTURES): VITD: 79.64 ng/mL (ref 30.00–100.00)

## 2022-11-03 LAB — VITAMIN B12: Vitamin B-12: 1422 pg/mL — ABNORMAL HIGH (ref 211–911)

## 2022-11-04 LAB — ANEMIA PROFILE B
Basophils Absolute: 0 10*3/uL (ref 0.0–0.2)
Basos: 0 %
EOS (ABSOLUTE): 0.2 10*3/uL (ref 0.0–0.4)
Eos: 3 %
Ferritin: 294 ng/mL — ABNORMAL HIGH (ref 15–150)
Folate: >20 ng/mL (ref >3.0)
Hematocrit: 42.5 % (ref 34.0–46.6)
Hemoglobin: 13 g/dL (ref 11.1–15.9)
Immature Grans (Abs): 0 10*3/uL (ref 0.0–0.1)
Immature Granulocytes: 0 %
Iron Saturation: 54 % (ref 15–55)
Iron: 147 ug/dL — ABNORMAL HIGH (ref 27–139)
Lymphocytes Absolute: 3.2 10*3/uL — ABNORMAL HIGH (ref 0.7–3.1)
Lymphs: 44 %
MCH: 23.1 pg — ABNORMAL LOW (ref 26.6–33.0)
MCHC: 30.6 g/dL — ABNORMAL LOW (ref 31.5–35.7)
MCV: 76 fL — ABNORMAL LOW (ref 79–97)
Monocytes Absolute: 0.4 10*3/uL (ref 0.1–0.9)
Monocytes: 6 %
Neutrophils Absolute: 3.3 10*3/uL (ref 1.4–7.0)
Neutrophils: 47 %
Platelets: 292 10*3/uL (ref 150–450)
RBC: 5.63 x10E6/uL — ABNORMAL HIGH (ref 3.77–5.28)
RDW: 14.8 % (ref 11.7–15.4)
Retic Ct Pct: 0.9 % (ref 0.6–2.6)
Total Iron Binding Capacity: 270 ug/dL (ref 250–450)
UIBC: 123 ug/dL (ref 118–369)
Vitamin B-12: >2000 pg/mL — ABNORMAL HIGH (ref 232–1245)
WBC: 7.2 10*3/uL (ref 3.4–10.8)

## 2022-11-05 ENCOUNTER — Other Ambulatory Visit: Payer: Self-pay

## 2022-11-05 DIAGNOSIS — E538 Deficiency of other specified B group vitamins: Secondary | ICD-10-CM

## 2022-11-05 DIAGNOSIS — D649 Anemia, unspecified: Secondary | ICD-10-CM

## 2022-11-13 ENCOUNTER — Ambulatory Visit (INDEPENDENT_AMBULATORY_CARE_PROVIDER_SITE_OTHER): Payer: Medicare Other | Admitting: Family Medicine

## 2022-11-13 VITALS — BP 110/70 | HR 68 | Temp 98.3°F | Ht 63.0 in | Wt 127.6 lb

## 2022-11-13 DIAGNOSIS — R413 Other amnesia: Secondary | ICD-10-CM

## 2022-11-13 DIAGNOSIS — I700 Atherosclerosis of aorta: Secondary | ICD-10-CM

## 2022-11-13 DIAGNOSIS — R748 Abnormal levels of other serum enzymes: Secondary | ICD-10-CM | POA: Diagnosis not present

## 2022-11-13 DIAGNOSIS — I4819 Other persistent atrial fibrillation: Secondary | ICD-10-CM | POA: Diagnosis not present

## 2022-11-13 DIAGNOSIS — I10 Essential (primary) hypertension: Secondary | ICD-10-CM | POA: Diagnosis not present

## 2022-11-13 DIAGNOSIS — E010 Iodine-deficiency related diffuse (endemic) goiter: Secondary | ICD-10-CM | POA: Diagnosis not present

## 2022-11-13 NOTE — Progress Notes (Signed)
SUBJECTIVE:   Chief Complaint  Patient presents with   Medical Management of Chronic Issues   HPI Patient presents to clinic with her daughter to discuss chronic disease management.  Patient with Alzheimer's disease. No acute concerns.  Hypertension Asymptomatic.  Does not check blood pressures at home.  Currently on metoprolol XL 25 mg daily.  Takes Lasix 20 mg as needed for shortness of breath or swelling of lower extremities but has not had to uses rarely.  Alzheimer's disease Memory remains unchanged.  Currently on Aricept 5 mg nightly and tolerating well.  Follows with Dr. Sherryll Burger neurology.  Denies any behavioral issues.  Hyperthyroid Asymptomatic.  Takes Tapazole 2.5 mg daily.  Follows with endocrinology and has appointment scheduled for July.  TSH managed by endocrinologist.   Persistent A-fib Denies any chest pain, shortness of breath, orthopnea, presyncopal episodes, signs of bleeding, weakness.  Currently on metoprolol XL 25 mg and Xarelto 20 mg daily.  Follows with cardiology and scheduled to see in July.   PERTINENT PMH / PSH: HTN PAFib on Eliquis Hypothyroid Alzheimer's disease  OBJECTIVE:  BP 110/70   Pulse 68   Temp 98.3 F (36.8 C) (Oral)   Ht 5\' 3"  (1.6 m)   Wt 127 lb 9.6 oz (57.9 kg)   SpO2 97%   BMI 22.60 kg/m    Physical Exam Vitals reviewed.  Constitutional:      General: She is not in acute distress.    Appearance: Normal appearance. She is normal weight. She is not ill-appearing, toxic-appearing or diaphoretic.  Eyes:     General:        Right eye: No discharge.        Left eye: No discharge.     Conjunctiva/sclera: Conjunctivae normal.  Cardiovascular:     Rate and Rhythm: Normal rate and regular rhythm.     Heart sounds: Normal heart sounds.  Pulmonary:     Effort: Pulmonary effort is normal.     Breath sounds: Normal breath sounds.  Abdominal:     General: Bowel sounds are normal.  Musculoskeletal:        General: Normal range  of motion.  Skin:    General: Skin is warm and dry.  Neurological:     Mental Status: She is alert and oriented to person, place, and time. Mental status is at baseline.  Psychiatric:        Mood and Affect: Mood normal.        Behavior: Behavior normal.        Thought Content: Thought content normal.        Judgment: Judgment normal.     ASSESSMENT/PLAN:  Hypertension, unspecified type Assessment & Plan: Chronic.  Stable.  Well-controlled on current medications. Continue metoprolol XL 25 mg daily Lasix 20 mg as needed for leg edema or shortness of breath Following with cardiology   Persistent atrial fibrillation [facility]) Assessment & Plan: Can make.  Rate controlled.   Continue Xarelto 20 mg daily Continue metoprolol XL 25 mg daily Follows with cardiology for management.   Aortic atherosclerosis (HCC) Assessment & Plan: LDL at goal without statin therapy     Thyromegaly Assessment & Plan: Chronic.  Clarification of methimazole dosing.  Patient takes 2.5 mg daily. Continue to follow with endocrinology   Memory loss Assessment & Plan: Could not find any documentation of Mini-Cog or mental status or previous workup for memory loss. Recommend neurology consult for evaluation. Continue donepezil 5 mg at bedtime, if neuro evaluation  negative can discontinue Encouraged daughter to promote independence while maintaining safety   Elevated vitamin B12 level Assessment & Plan: New problem Stop multivitamins Repeat labs 1-2 months    HCM Recommend shingles vaccine Medicare annual wellness due  PDMP reviewed  Return in about 8 months (around 07/15/2023), or if symptoms worsen or fail to improve, for PCP.  Dana Allan, MD

## 2022-11-13 NOTE — Patient Instructions (Addendum)
It was a pleasure meeting you today. Thank you for allowing me to take part in your health care.  Our goals for today as we discussed include:  Blood pressure is good  Schedule Medicare Annual Wellness Visit   Schedule lab appointment for 1-2 months after stopping multivitamins  Follow up with PCP in 8 months  Recommend Shingles vaccine.  This is a 2 dose series and can be given at your local pharmacy.  Please talk to your pharmacist about this.   If you have any questions or concerns, please do not hesitate to call the office at [phone].  I look forward to our next visit and until then take care and stay safe.  Regards,   Dana Allan, MD   [facility]

## 2022-11-20 ENCOUNTER — Encounter: Payer: Self-pay | Admitting: Family Medicine

## 2022-11-20 DIAGNOSIS — Z538 Procedure and treatment not carried out for other reasons: Secondary | ICD-10-CM | POA: Insufficient documentation

## 2022-11-22 ENCOUNTER — Encounter: Payer: Self-pay | Admitting: Family Medicine

## 2022-11-22 DIAGNOSIS — R748 Abnormal levels of other serum enzymes: Secondary | ICD-10-CM | POA: Insufficient documentation

## 2022-11-22 NOTE — Assessment & Plan Note (Signed)
LDL at goal without statin therapy

## 2022-11-22 NOTE — Assessment & Plan Note (Signed)
Could not find any documentation of Mini-Cog or mental status or previous workup for memory loss. Recommend neurology consult for evaluation. Continue donepezil 5 mg at bedtime, if neuro evaluation negative can discontinue Encouraged daughter to promote independence while maintaining safety 

## 2022-11-22 NOTE — Assessment & Plan Note (Signed)
Chronic.  Stable.  Well-controlled on current medications. Continue metoprolol XL 25 mg daily Lasix 20 mg as needed for leg edema or shortness of breath Following with cardiology

## 2022-11-22 NOTE — Assessment & Plan Note (Addendum)
Chronic.  Clarification of methimazole dosing.  Patient takes 2.5 mg daily. Continue to follow with endocrinology

## 2022-11-22 NOTE — Assessment & Plan Note (Signed)
New problem Stop multivitamins Repeat labs 1-2 months

## 2022-11-22 NOTE — Assessment & Plan Note (Signed)
Can make.  Rate controlled.   Continue Xarelto 20 mg daily Continue metoprolol XL 25 mg daily Follows with cardiology for management.

## 2022-12-15 ENCOUNTER — Ambulatory Visit: Payer: Medicare Other | Admitting: Podiatry

## 2022-12-26 ENCOUNTER — Other Ambulatory Visit (INDEPENDENT_AMBULATORY_CARE_PROVIDER_SITE_OTHER): Payer: Medicare Other

## 2022-12-26 DIAGNOSIS — D649 Anemia, unspecified: Secondary | ICD-10-CM | POA: Diagnosis not present

## 2022-12-26 DIAGNOSIS — E538 Deficiency of other specified B group vitamins: Secondary | ICD-10-CM | POA: Diagnosis not present

## 2022-12-26 LAB — VITAMIN B12: Vitamin B-12: 532 pg/mL (ref 211–911)

## 2022-12-27 LAB — IRON,TIBC AND FERRITIN PANEL
%SAT: 25 % (calc) (ref 16–45)
Ferritin: 102 ng/mL (ref 16–288)
Iron: 70 ug/dL (ref 45–160)
TIBC: 276 mcg/dL (calc) (ref 250–450)

## 2022-12-28 ENCOUNTER — Encounter: Payer: Self-pay | Admitting: Family Medicine

## 2022-12-29 ENCOUNTER — Ambulatory Visit (INDEPENDENT_AMBULATORY_CARE_PROVIDER_SITE_OTHER): Payer: Medicare Other | Admitting: Podiatry

## 2022-12-29 DIAGNOSIS — B351 Tinea unguium: Secondary | ICD-10-CM | POA: Diagnosis not present

## 2022-12-29 DIAGNOSIS — L84 Corns and callosities: Secondary | ICD-10-CM

## 2022-12-29 DIAGNOSIS — M79675 Pain in left toe(s): Secondary | ICD-10-CM

## 2022-12-29 DIAGNOSIS — M79674 Pain in right toe(s): Secondary | ICD-10-CM | POA: Diagnosis not present

## 2023-01-01 ENCOUNTER — Encounter: Payer: Self-pay | Admitting: Podiatry

## 2023-01-01 NOTE — Progress Notes (Signed)
  Subjective:  Patient ID: Kelli Smith, female    DOB: 1941-06-16,  MRN: [id number]  Kelli Smith presents to clinic today for corn(s) left foot, callus(es) left foot and painful mycotic nails.  Pain interferes with ambulation. Aggravating factors include wearing enclosed shoe gear. Painful toenails interfere with ambulation. Aggravating factors include wearing enclosed shoe gear. Pain is relieved with periodic professional debridement. Painful corns and calluses are aggravated when weightbearing with and without shoegear. Pain is relieved with periodic professional debridement.  Chief Complaint  Patient presents with   Nail Problem    RFC,PCP:   Dana Allan, MD,LOV:04/24       New problem(s): None.   PCP is Dana Allan, MD.  No Known Allergies  Review of Systems: Negative except as noted in the HPI.  Objective: No changes noted in today's physical examination. There were no vitals filed for this visit. Kelli Smith is a pleasant 82 y.o. female WD, WN in NAD. AAO x 3.  Vascular Examination: Capillary refill time immediate b/l. Vascular status intact b/l with palpable pedal pulses. Pedal hair sparse b/l. No pain with calf compression b/l. Skin temperature gradient WNL b/l. No cyanosis or clubbing b/l. No ischemia or gangrene noted b/l.   Neurological Examination: Sensation grossly intact b/l with 10 gram monofilament. Vibratory sensation intact b/l.   Dermatological Examination: Pedal skin with normal turgor, texture and tone b/l.  No open wounds. No interdigital macerations.   Toenails 1-5 b/l thick, discolored, elongated with subungual debris and pain on dorsal palpation.   Hyperkeratotic lesion(s) left great toe.  No erythema, no edema, no drainage, no fluctuance. Preulcerative lesion noted distal tip of left 3rd toe. There is visible subdermal hemorrhage. There is no surrounding erythema, no edema, no drainage, no odor, no fluctuance.  Musculoskeletal  Examination: Muscle strength 5/5 to all lower extremity muscle groups bilaterally. Hammertoe deformity noted 2-5 b/l.  Radiographs: None  Last A1c:      Latest Ref Rng & Units 11/03/2022    8:28 AM  Hemoglobin A1C  Hemoglobin-A1c 4.6 - 6.5 % 5.5    Assessment/Plan: 1. Pain due to onychomycosis of toenails of both feet   2. Callus   3. Pre-ulcerative corn or callous     -Consent given for treatment as described below: -Examined patient. -Patient to continue soft, supportive shoe gear daily. -Toenails 1-5 b/l were debrided in length and girth with sterile nail nippers and dremel without iatrogenic bleeding.  -Callus(es) left great toe pared utilizing sterile scalpel blade without complication or incident. Total number debrided =1. -Preulcerative lesion pared L 3rd toe utilizing sterile scalpel blade. Total number pared=1. -Patient/POA to call should there be question/concern in the interim.   Return in about 3 months (around 03/31/2023).  Freddie Breech, DPM

## 2023-01-07 DIAGNOSIS — E042 Nontoxic multinodular goiter: Secondary | ICD-10-CM | POA: Diagnosis not present

## 2023-01-07 DIAGNOSIS — E0590 Thyrotoxicosis, unspecified without thyrotoxic crisis or storm: Secondary | ICD-10-CM | POA: Diagnosis not present

## 2023-02-03 ENCOUNTER — Other Ambulatory Visit: Payer: Self-pay

## 2023-02-03 ENCOUNTER — Encounter: Payer: Self-pay | Admitting: Cardiology

## 2023-02-03 DIAGNOSIS — I4819 Other persistent atrial fibrillation: Secondary | ICD-10-CM

## 2023-02-03 MED ORDER — RIVAROXABAN 20 MG PO TABS
20.0000 mg | ORAL_TABLET | Freq: Every day | ORAL | 5 refills | Status: DC
Start: 2023-02-03 — End: 2023-08-12

## 2023-02-03 NOTE — Telephone Encounter (Signed)
Prescription refill request for Xarelto received.  Indication:afib Last office visit:2/24 Weight:57.9  kg Age:82 Scr:0.78  4/24 CrCl:50.83  ml/min  Prescription refilled

## 2023-02-03 NOTE — Telephone Encounter (Signed)
Please review

## 2023-02-04 ENCOUNTER — Other Ambulatory Visit: Payer: Medicare Other

## 2023-04-09 ENCOUNTER — Ambulatory Visit (INDEPENDENT_AMBULATORY_CARE_PROVIDER_SITE_OTHER): Payer: Medicare Other | Admitting: Podiatry

## 2023-04-09 ENCOUNTER — Encounter: Payer: Self-pay | Admitting: Podiatry

## 2023-04-09 DIAGNOSIS — M79674 Pain in right toe(s): Secondary | ICD-10-CM | POA: Diagnosis not present

## 2023-04-09 DIAGNOSIS — B351 Tinea unguium: Secondary | ICD-10-CM | POA: Diagnosis not present

## 2023-04-09 DIAGNOSIS — L84 Corns and callosities: Secondary | ICD-10-CM | POA: Diagnosis not present

## 2023-04-09 DIAGNOSIS — M79675 Pain in left toe(s): Secondary | ICD-10-CM | POA: Diagnosis not present

## 2023-04-14 NOTE — Progress Notes (Unsigned)
Subjective:  Patient ID: Kelli Smith, female    DOB: 1941-07-16,  MRN: [id number]  Kelli Smith presents to clinic today for: callus(es) left foot and painful thick toenails that are difficult to trim. Painful toenails interfere with ambulation. Aggravating factors include wearing enclosed shoe gear. Pain is relieved with periodic professional debridement. Painful calluses are aggravated when weightbearing with and without shoegear. Pain is relieved with periodic professional debridement. Her daughter is present during today's visit. Chief Complaint  Patient presents with   Nail Problem    RFC,Referring Provider Dana Allan, MD,lov:04/24      PCP is Dana Allan, MD.  No Known Allergies  Review of Systems: Negative except as noted in the HPI.  Objective: No changes noted in today's physical examination. There were no vitals filed for this visit.  Kelli Smith is a pleasant 82 y.o. female in NAD. AAO x 3.  Vascular Examination: Capillary refill time <3 seconds b/l LE. Palpable pedal pulses b/l LE. Digital hair present b/l. No pedal edema b/l. Skin temperature gradient WNL b/l. No varicosities b/l. Pedal hair sparse. No cyanosis or clubbing noted b/l LE.Marland Kitchen  Dermatological Examination: Pedal skin with normal turgor, texture and tone b/l. No open wounds. No interdigital macerations b/l. Toenails 1-5 b/l thickened, discolored, dystrophic with subungual debris. There is pain on palpation to dorsal aspect of nailplates. Incurvated nailplate lateral border left hallux.  Nail border hypertrophy absent. There is tenderness to palpation. Sign(s) of infection: no clinical signs of infection noted on examination today.. Hyperkeratotic lesion(s) left great toe and left third digit.  No erythema, no edema, no drainage, no fluctuance..  Neurological Examination: Protective sensation intact with 10 gram monofilament b/l LE. Vibratory sensation intact b/l LE.   Musculoskeletal  Examination: Muscle strength 5/5 to all lower extremity muscle groups bilaterally. Hammertoe(s) noted to the 2-5 left foot and 2-5 right foot.     Latest Ref Rng & Units 11/03/2022    8:28 AM  Hemoglobin A1C  Hemoglobin-A1c 4.6 - 6.5 % 5.5    Assessment/Plan: 1. Pain due to onychomycosis of toenails of both feet     -Patient was evaluated and treated. All patient's and/or POA's questions/concerns answered on today's visit. -Continue supportive shoe gear daily. -Mycotic toenails 1-5 bilaterally were debrided in length and girth with sterile nail nippers and dremel without incident. -No invasive procedure(s) performed. Offending nail border debrided and curretaged lateral border left hallux utilizing sterile nail nipper and currette. Border cleansed with alcohol and TAO was applied. No further treatment required by patient/caregiver. Call office if there are any concerns. -Callus(es) left great toe and left third digit pared utilizing sterile scalpel blade without complication or incident. Total number debrided =2. -Patient/POA to call should there be question/concern in the interim.   Return in about 3 months (around 07/09/2023).  Freddie Breech, DPM

## 2023-04-15 DIAGNOSIS — E0590 Thyrotoxicosis, unspecified without thyrotoxic crisis or storm: Secondary | ICD-10-CM | POA: Diagnosis not present

## 2023-04-15 DIAGNOSIS — E042 Nontoxic multinodular goiter: Secondary | ICD-10-CM | POA: Diagnosis not present

## 2023-05-03 DIAGNOSIS — Z23 Encounter for immunization: Secondary | ICD-10-CM | POA: Diagnosis not present

## 2023-06-09 ENCOUNTER — Telehealth: Payer: Self-pay | Admitting: Family Medicine

## 2023-06-09 NOTE — Telephone Encounter (Signed)
Patient dropped off document Handicap Placard, to be filled out by provider. Patient requested to send it via Call Patient to pick up within 5-days. Document is located in providers folder at front office.Please advise at [facility] [phone]. Her daughter said she can come pick up when ready. Her number is [phone]

## 2023-06-10 NOTE — Telephone Encounter (Signed)
Placed in Dr. Clent Ridges folder.

## 2023-06-11 NOTE — Telephone Encounter (Signed)
Copy made, placed up front in folder, and called pt to make her aware that form is ready for pick up.

## 2023-06-12 ENCOUNTER — Telehealth: Payer: Self-pay | Admitting: Cardiology

## 2023-06-12 MED ORDER — METOPROLOL SUCCINATE ER 25 MG PO TB24: 25.0000 mg | ORAL_TABLET | Freq: Every day | ORAL | 0 refills | Status: DC

## 2023-06-12 NOTE — Telephone Encounter (Signed)
*  STAT* If patient is at the pharmacy, call can be transferred to refill team.   1. Which medications need to be refilled? (please list name of each medication and dose if known) metoprolol succinate (TOPROL XL) 25 MG 24 hr tablet   2. Which pharmacy/location (including street and city if local pharmacy) is medication to be sent to?  [id number] [address], [place] [phone]    3. Do they need a 30 day or 90 day supply? 90

## 2023-06-12 NOTE — Telephone Encounter (Signed)
Pt's medication was sent to pt's pharmacy as requested. Confirmation received.  °

## 2023-06-19 ENCOUNTER — Encounter: Payer: Self-pay | Admitting: Podiatry

## 2023-06-19 ENCOUNTER — Ambulatory Visit: Payer: Medicare Other | Admitting: Podiatry

## 2023-06-19 VITALS — Ht 63.0 in | Wt 127.6 lb

## 2023-06-19 DIAGNOSIS — B351 Tinea unguium: Secondary | ICD-10-CM | POA: Diagnosis not present

## 2023-06-19 DIAGNOSIS — M79674 Pain in right toe(s): Secondary | ICD-10-CM | POA: Diagnosis not present

## 2023-06-19 DIAGNOSIS — M79675 Pain in left toe(s): Secondary | ICD-10-CM | POA: Diagnosis not present

## 2023-06-19 DIAGNOSIS — L84 Corns and callosities: Secondary | ICD-10-CM | POA: Diagnosis not present

## 2023-06-27 NOTE — Progress Notes (Signed)
  Subjective:  Patient ID: Kelli Smith, female    DOB: [birth year],  MRN: [id number]  82 y.o. female presents callus(es) left foot and painful thick toenails that are difficult to trim. Painful toenails interfere with ambulation. Aggravating factors include wearing enclosed shoe gear. Pain is relieved with periodic professional debridement. Painful calluses are aggravated when weightbearing with and without shoegear. Pain is relieved with periodic professional debridement.  Chief Complaint  Patient presents with   Nail Problem    Pt is here for RFC, not a diabetis, PCP is Dr Clent Ridges and LOV was in April.   New problem(s): None   PCP is Dana Allan, MD , and last visit was November 13, 2022.  No Known Allergies  Review of Systems: Negative except as noted in the HPI.   Objective:  Kelli Smith is a pleasant 82 y.o. female WD, WN in NAD.Marland Kitchen AAO x 3.  Vascular Examination: Vascular status intact b/l with palpable pedal pulses. CFT immediate b/l. Pedal hair present. No edema. No pain with calf compression b/l. Skin temperature gradient WNL b/l. No varicosities noted. No cyanosis or clubbing noted.  Neurological Examination: Sensation grossly intact b/l with 10 gram monofilament. Vibratory sensation intact b/l.  Dermatological Examination: Pedal skin with normal turgor, texture and tone b/l. No open wounds nor interdigital macerations noted. Toenails 1-5 b/l thick, discolored, elongated with subungual debris and pain on dorsal palpation.   Hyperkeratotic lesion(s) left great toe.  No erythema, no edema, no drainage, no fluctuance.  Musculoskeletal Examination: Muscle strength 5/5 to b/l LE.  No pain, crepitus noted b/l. Hammertoe deformity noted 2-5 b/l.   Radiographs: None  Last A1c:      Latest Ref Rng & Units 11/03/2022    8:28 AM  Hemoglobin A1C  Hemoglobin-A1c 4.6 - 6.5 % 5.5      Assessment:   1. Pain due to onychomycosis of toenails of both feet   2. Corns    Plan:   -Patient's family member present. All questions/concerns addressed on today's visit. -Patient to continue soft, supportive shoe gear daily. -Toenails 1-5 b/l were debrided in length and girth with sterile nail nippers and dremel without iatrogenic bleeding.  -Corn(s) L hallux pared utilizing sterile scalpel blade without complication or incident. Total number debrided=1. -Patient/POA to call should there be question/concern in the interim.  Return in about 3 months (around 09/19/2023).  Freddie Breech, DPM      [place] LOCATION: [address], [place] [address]                   Office [phone]   [facility] LOCATION: [address] [place], [place] [address] Office [phone]

## 2023-06-29 DIAGNOSIS — Z85828 Personal history of other malignant neoplasm of skin: Secondary | ICD-10-CM | POA: Diagnosis not present

## 2023-06-29 DIAGNOSIS — D2272 Melanocytic nevi of left lower limb, including hip: Secondary | ICD-10-CM | POA: Diagnosis not present

## 2023-06-29 DIAGNOSIS — D225 Melanocytic nevi of trunk: Secondary | ICD-10-CM | POA: Diagnosis not present

## 2023-06-29 DIAGNOSIS — C4401 Basal cell carcinoma of skin of lip: Secondary | ICD-10-CM | POA: Diagnosis not present

## 2023-06-29 DIAGNOSIS — D2262 Melanocytic nevi of left upper limb, including shoulder: Secondary | ICD-10-CM | POA: Diagnosis not present

## 2023-06-29 DIAGNOSIS — L400 Psoriasis vulgaris: Secondary | ICD-10-CM | POA: Diagnosis not present

## 2023-06-29 DIAGNOSIS — D485 Neoplasm of uncertain behavior of skin: Secondary | ICD-10-CM | POA: Diagnosis not present

## 2023-06-29 DIAGNOSIS — D2261 Melanocytic nevi of right upper limb, including shoulder: Secondary | ICD-10-CM | POA: Diagnosis not present

## 2023-06-29 DIAGNOSIS — L821 Other seborrheic keratosis: Secondary | ICD-10-CM | POA: Diagnosis not present

## 2023-06-29 DIAGNOSIS — D2271 Melanocytic nevi of right lower limb, including hip: Secondary | ICD-10-CM | POA: Diagnosis not present

## 2023-07-08 DIAGNOSIS — E042 Nontoxic multinodular goiter: Secondary | ICD-10-CM | POA: Diagnosis not present

## 2023-07-08 DIAGNOSIS — E0590 Thyrotoxicosis, unspecified without thyrotoxic crisis or storm: Secondary | ICD-10-CM | POA: Diagnosis not present

## 2023-07-15 ENCOUNTER — Ambulatory Visit: Payer: Medicare Other | Admitting: Family Medicine

## 2023-07-15 VITALS — BP 120/70 | HR 79 | Temp 97.8°F | Ht 63.0 in | Wt 149.4 lb

## 2023-07-15 DIAGNOSIS — E042 Nontoxic multinodular goiter: Secondary | ICD-10-CM | POA: Diagnosis not present

## 2023-07-15 DIAGNOSIS — E611 Iron deficiency: Secondary | ICD-10-CM

## 2023-07-15 DIAGNOSIS — R413 Other amnesia: Secondary | ICD-10-CM

## 2023-07-15 DIAGNOSIS — I10 Essential (primary) hypertension: Secondary | ICD-10-CM | POA: Diagnosis not present

## 2023-07-15 DIAGNOSIS — I4819 Other persistent atrial fibrillation: Secondary | ICD-10-CM

## 2023-07-15 DIAGNOSIS — K5900 Constipation, unspecified: Secondary | ICD-10-CM

## 2023-07-15 LAB — COMPREHENSIVE METABOLIC PANEL
ALT: 14 U/L (ref 0–35)
AST: 23 U/L (ref 0–37)
Albumin: 3.8 g/dL (ref 3.5–5.2)
Alkaline Phosphatase: 95 U/L (ref 39–117)
BUN: 21 mg/dL (ref 6–23)
CO2: 30 meq/L (ref 19–32)
Calcium: 9.4 mg/dL (ref 8.4–10.5)
Chloride: 105 meq/L (ref 96–112)
Creatinine, Ser: 0.67 mg/dL (ref 0.40–1.20)
GFR: 81.02 mL/min (ref >60.00)
Glucose, Bld: 91 mg/dL (ref 70–99)
Potassium: 5 meq/L (ref 3.5–5.1)
Sodium: 140 meq/L (ref 135–145)
Total Bilirubin: 0.4 mg/dL (ref 0.2–1.2)
Total Protein: 6.4 g/dL (ref 6.0–8.3)

## 2023-07-15 LAB — CBC WITH DIFFERENTIAL/PLATELET
Basophils Absolute: 0 10*3/uL (ref 0.0–0.1)
Basophils Relative: 0.5 % (ref 0.0–3.0)
Eosinophils Absolute: 0.2 10*3/uL (ref 0.0–0.7)
Eosinophils Relative: 2.9 % (ref 0.0–5.0)
HCT: 38.7 % (ref 36.0–46.0)
Hemoglobin: 12.4 g/dL (ref 12.0–15.0)
Lymphocytes Relative: 23.8 % (ref 12.0–46.0)
Lymphs Abs: 1.8 10*3/uL (ref 0.7–4.0)
MCHC: 31.9 g/dL (ref 30.0–36.0)
MCV: 74.8 fL — ABNORMAL LOW (ref 78.0–100.0)
Monocytes Absolute: 0.6 10*3/uL (ref 0.1–1.0)
Monocytes Relative: 7.8 % (ref 3.0–12.0)
Neutro Abs: 4.9 10*3/uL (ref 1.4–7.7)
Neutrophils Relative %: 65 % (ref 43.0–77.0)
Platelets: 213 10*3/uL (ref 150.0–400.0)
RBC: 5.18 Mil/uL — ABNORMAL HIGH (ref 3.87–5.11)
RDW: 15 % (ref 11.5–15.5)
WBC: 7.6 10*3/uL (ref 4.0–10.5)

## 2023-07-15 NOTE — Patient Instructions (Signed)
It was a pleasure meeting you today. Thank you for allowing me to take part in your health care.  Our goals for today as we discussed include:  We will get some labs today.  If they are abnormal or we need to do something about them, I will call you.  If they are normal, I will send you a message on MyChart (if it is active) or a letter in the mail.  If you don't hear from [place] in 2 weeks, please call the office at the number below.   Continue current medication  Follow up with pharmacy for next shingles vaccine   This is a list of the screening recommended for you and due dates:  Health Maintenance  Topic Date Due   Zoster (Shingles) Vaccine (1 of 2) Never done   COVID-19 Vaccine (4 - 2024-25 season) 03/29/2023   Medicare Annual Wellness Visit  07/25/2023   DTaP/Tdap/Td vaccine (2 - Td or Tdap) 05/21/2031   Pneumonia Vaccine  Completed   Flu Shot  Completed   DEXA scan (bone density measurement)  Completed   HPV Vaccine  Aged Out    Follow  up in 6 months   If you have any questions or concerns, please do not hesitate to call the office at [phone].  I look forward to our next visit and until then take care and stay safe.  Regards,   Dana Allan, MD   [facility]

## 2023-07-15 NOTE — Progress Notes (Signed)
SUBJECTIVE:   Chief Complaint  Patient presents with   Medical Management of Chronic Issues   HPI Presents for follow up chronic disease management  Discussed the use of AI scribe software for clinical note transcription with the patient, who gave verbal consent to proceed.  History of Present Illness  The patient, with a history of dementia, heart failure, and thyroid disease, presents for a routine follow-up. She has discontinued Aricept several months ago and reports no noticeable changes in memory. The patient's caregiver, who is also her daughter, corroborates this, stating that there has been no significant progression or decline in the patient's memory.  The patient has also been off Lasix for about a year, with no reported changes in her heart failure symptoms. She has recently restarted Methimazole at a half dose due to abnormal thyroid labs after a period of discontinuation.  The patient spends her days at home, taking care of a baby, and is reported to manage this well, with no need for reminders. She is reported to have occasional constipation, which is managed with increased water intake and consumption of prunes and prune juice.  The patient is also on Xarelto, prescribed by her cardiologist, with no reported bleeding issues, just occasional bruising. She has not had any chest pain or shortness of breath. The patient's diet is prepared by her caregiver, and she is encouraged to drink more water.  The patient recently had her flu shot and the first dose of the shingles vaccine. She is due for the second dose of the shingles vaccine, which she plans to get at a pharmacy.    PERTINENT PMH / PSH: As above  OBJECTIVE:  BP 120/70   Pulse 79   Temp 97.8 F (36.6 C) (Oral)   Ht 5\' 3"  (1.6 m)   Wt 149 lb 6.4 oz (67.8 kg)   SpO2 97%   BMI 26.47 kg/m    Physical Exam Vitals reviewed.  Constitutional:      General: She is not in acute distress.    Appearance: Normal  appearance. She is not ill-appearing, toxic-appearing or diaphoretic.  Eyes:     General:        Right eye: No discharge.        Left eye: No discharge.     Conjunctiva/sclera: Conjunctivae normal.  Neck:     Thyroid: No thyroid mass or thyroid tenderness.  Cardiovascular:     Rate and Rhythm: Normal rate and regular rhythm.     Heart sounds: Normal heart sounds.  Pulmonary:     Effort: Pulmonary effort is normal.     Breath sounds: Normal breath sounds.  Abdominal:     General: Bowel sounds are normal.  Musculoskeletal:        General: Normal range of motion.  Skin:    General: Skin is warm and dry.  Neurological:     Mental Status: She is alert and oriented to person, place, and time. Mental status is at baseline.  Psychiatric:        Mood and Affect: Mood normal.        Behavior: Behavior normal.        11/13/2022    8:20 AM 07/24/2022    3:40 PM 07/01/2022    9:05 AM 02/27/2022    1:41 PM 06/04/2021    2:26 PM  Depression screen PHQ 2/9  Decreased Interest 0 0 0 0 0  Down, Depressed, Hopeless 0 0 0 0 0  PHQ - 2  Score 0 0 0 0 0  Altered sleeping 0      Tired, decreased energy 0      Change in appetite 0      Feeling bad or failure about yourself  0      Trouble concentrating 0      Moving slowly or fidgety/restless 0      Suicidal thoughts 0      PHQ-9 Score 0          11/13/2022    8:20 AM 04/05/2020    8:07 AM  GAD 7 : Generalized Anxiety Score  Nervous, Anxious, on Edge 0 0  Control/stop worrying 0 0  Worry too much - different things 0 0  Trouble relaxing 0 0  Restless 0 0  Easily annoyed or irritable 0 0  Afraid - awful might happen 0 0  Total GAD 7 Score 0 0  Anxiety Difficulty  Not difficult at all    ASSESSMENT/PLAN:  Persistent atrial fibrillation [facility]) Assessment & Plan: Patient on Xarelto, prescribed by cardiologist. No recent bleeding events reported. Discussion about potential switch to Eliquis for decreased bleeding risk, but patient  recently refilled Xarelto. -Continue Xarelto as prescribed by cardiologist. Consider discussing potential switch to Eliquis with cardiologist at next visit.  Orders: -     CBC with Differential/Platelet  Hypertension, unspecified type -     Comprehensive metabolic panel  Memory loss Assessment & Plan: Patient self-discontinued Aricept several months ago. No noticeable decline in memory or cognitive function reported by patient or caregiver. No current plans to restart. -No changes to current management.   Constipation, unspecified constipation type Assessment & Plan: Occasional constipation reported. Patient's caregiver encourages water intake and provides prunes and prune juice. -Increase water intake and continue current dietary interventions.   Multinodular thyroid Assessment & Plan: Recent abnormal thyroid labs. Restarted on Methimazole 2.5mg  daily by Dr. Gershon Crane. Plan for recheck in March. -Continue Methimazole 2.5mg  daily as prescribed by Dr. Gershon Crane.   Iron deficiency -     Iron (Ferrous Sulfate); Take 325 mg by mouth every other day.  Dispense: 90 tablet; Refill: 3   General Health Maintenance -Ensure completion of second shingles vaccine dose at pharmacy.   PDMP reviewed  Return in about 6 months (around 01/13/2024) for PCP, HTN.  Dana Allan, MD

## 2023-07-21 ENCOUNTER — Encounter: Payer: Self-pay | Admitting: Family Medicine

## 2023-07-21 DIAGNOSIS — K5900 Constipation, unspecified: Secondary | ICD-10-CM | POA: Insufficient documentation

## 2023-07-21 DIAGNOSIS — E042 Nontoxic multinodular goiter: Secondary | ICD-10-CM | POA: Insufficient documentation

## 2023-07-21 MED ORDER — IRON (FERROUS SULFATE) 325 (65 FE) MG PO TABS: 325.0000 mg | ORAL_TABLET | ORAL | 3 refills | Status: DC

## 2023-07-21 NOTE — Assessment & Plan Note (Signed)
Recent abnormal thyroid labs. Restarted on Methimazole 2.5mg  daily by Dr. Gershon Crane. Plan for recheck in March. -Continue Methimazole 2.5mg  daily as prescribed by Dr. Gershon Crane.

## 2023-07-21 NOTE — Assessment & Plan Note (Signed)
Occasional constipation reported. Patient's caregiver encourages water intake and provides prunes and prune juice. -Increase water intake and continue current dietary interventions.

## 2023-07-21 NOTE — Assessment & Plan Note (Signed)
Patient on Xarelto, prescribed by cardiologist. No recent bleeding events reported. Discussion about potential switch to Eliquis for decreased bleeding risk, but patient recently refilled Xarelto. -Continue Xarelto as prescribed by cardiologist. Consider discussing potential switch to Eliquis with cardiologist at next visit.

## 2023-07-21 NOTE — Assessment & Plan Note (Signed)
Patient self-discontinued Aricept several months ago. No noticeable decline in memory or cognitive function reported by patient or caregiver. No current plans to restart. -No changes to current management.

## 2023-07-21 NOTE — Assessment & Plan Note (Deleted)
Recent abnormal thyroid labs. Restarted on Methimazole 2.5mg  daily by Dr. Gershon Crane. Plan for recheck in March. -Continue Methimazole 2.5mg  daily as prescribed by Dr. Gershon Crane.

## 2023-08-10 ENCOUNTER — Ambulatory Visit (INDEPENDENT_AMBULATORY_CARE_PROVIDER_SITE_OTHER): Payer: Medicare Other | Admitting: *Deleted

## 2023-08-10 VITALS — Ht 63.0 in | Wt 149.0 lb

## 2023-08-10 DIAGNOSIS — Z0000 Encounter for general adult medical examination without abnormal findings: Secondary | ICD-10-CM | POA: Diagnosis not present

## 2023-08-10 NOTE — Patient Instructions (Signed)
 Kelli Smith , Thank you for taking time to come for your Medicare Wellness Visit. I appreciate your ongoing commitment to your health goals. Please review the following plan we discussed and let me know if I can assist you in the future.   Referrals/Orders/Follow-Ups/Clinician Recommendations: Remember to get your second shingles vaccine.  This is a list of the screening recommended for you and due dates:  Health Maintenance  Topic Date Due   Mammogram  01/21/2023   COVID-19 Vaccine (4 - 2024-25 season) 03/29/2023   Zoster (Shingles) Vaccine (2 of 2) 06/28/2023   Medicare Annual Wellness Visit  08/09/2024   DTaP/Tdap/Td vaccine (2 - Td or Tdap) 05/21/2031   Pneumonia Vaccine  Completed   Flu Shot  Completed   DEXA scan (bone density measurement)  Completed   HPV Vaccine  Aged Out    Advanced directives: (Declined) Advance directive discussed with you today. Even though you declined this today, please call our office should you change your mind, and we can give you the proper paperwork for you to fill out.  Next Medicare Annual Wellness Visit scheduled for next year: Yes 08/10/24 @ 8:10

## 2023-08-10 NOTE — Progress Notes (Signed)
 Subjective:   Kelli Smith is a 83 y.o. female who presents for Medicare Annual (Subsequent) preventive examination.  Visit Complete: Virtual I connected with  Kelli Smith on 08/10/23 by a video and audio enabled telemedicine application and verified that I am speaking with the correct person using two identifiers. Patient's daughter Kelli Smith helped with the visit.  Patient Location: Home  Provider Location: Office/Clinic  I discussed the limitations of evaluation and management by telemedicine. The patient expressed understanding and agreed to proceed.  Vital Signs: Because this visit was a virtual/telehealth visit, some criteria may be missing or patient reported. Any vitals not documented were not able to be obtained and vitals that have been documented are patient reported.   Cardiac Risk Factors include: advanced age (>7men, >77 women);hypertension;Other (see comment), Risk factor comments: AFib     Objective:    Today's Vitals   08/10/23 0853  Weight: 149 lb (67.6 kg)  Height: 5' 3 (1.6 m)   Body mass index is 26.39 kg/m.     08/10/2023    9:09 AM 07/24/2022    3:40 PM 10/28/2021    9:47 AM 10/14/2021    1:10 PM 12/12/2020   11:19 AM 11/30/2020    2:42 PM 05/28/2020    3:17 PM  Advanced Directives  Does Patient Have a Medical Advance Directive? No No No No No No No  Would patient like information on creating a medical advance directive? No - Patient declined No - Patient declined No - Patient declined  No - Patient declined  No - Patient declined    Current Medications (verified) Outpatient Encounter Medications as of 08/10/2023  Medication Sig   acetaminophen  (TYLENOL ) 650 MG CR tablet Take 1,300 mg by mouth every 8 (eight) hours as needed for pain.   Ascorbic Acid  (VITAMIN C  PO) Take by mouth daily.   Calcium  Carbonate-Vit D-Min (CALCIUM  600+D3 PLUS MINERALS PO) Take 1,200 mg by mouth daily.   donepezil  (ARICEPT ) 5 MG tablet Take 1 tablet (5 mg total) by mouth  at bedtime.   furosemide  (LASIX ) 20 MG tablet Take 1 tablet (20 mg total) by mouth daily as needed. For swelling and shortness of breath.   Iron , Ferrous Sulfate , 325 (65 Fe) MG TABS Take 325 mg by mouth every other day.   methimazole  (TAPAZOLE ) 5 MG tablet Take by mouth.   metoprolol  succinate (TOPROL  XL) 25 MG 24 hr tablet Take 1 tablet (25 mg total) by mouth daily.   rivaroxaban  (XARELTO ) 20 MG TABS tablet Take 1 tablet (20 mg total) by mouth daily with supper.   No facility-administered encounter medications on file as of 08/10/2023.    Allergies (verified) Patient has no known allergies.   History: Past Medical History:  Diagnosis Date   A-fib (HCC)    a.) CHA2DS2-VASc = 5 (age x 2, sex, HTN, aortic plaque). b.) s/p DCCV (200J x 2) 11/07/2020. c.) rate/rhythm maintained on oral amiodarone ; chronically anticoagulated using rivaroxaban .   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/2020   B12 deficiency    Basal cell carcinoma    CAD (coronary artery disease)    CAD (coronary artery disease) 11/22/2020   Cyst of right kidney    DDD (degenerative disc disease), lumbar    Hematuria 04/05/2020   History of chicken pox    History  of kidney stones    HTN (hypertension)    Knee pain, bilateral 06/11/2018   Long term current use of anticoagulant    a.) rivaroxaban    Memory loss    Microcytosis    Osteopenia    PAC (premature atrial contraction)    Pain of right heel 04/05/2020   Premature atrial contraction 09/20/2019   Primary osteoarthritis of both knees 09/30/2020   Right shoulder pain 06/11/2018   Thyromegaly    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   Past Surgical History:  Procedure Laterality Date   BUNIONECTOMY WITH HAMMERTOE RECONSTRUCTION  2009   CARDIOVERSION N/A 11/07/2020   Procedure: CARDIOVERSION (200J x 2); Location: [facility];  Surgeon: Redell Cave, MD   CATARACT EXTRACTION Bilateral    2013/2014   CESAREAN SECTION N/A 1978   KNEE ARTHROPLASTY Left 12/12/2020   Procedure: COMPUTER ASSISTED TOTAL KNEE ARTHROPLASTY;  Surgeon: Mardee Lynwood SQUIBB, MD;  Location: [facility];  Service: Orthopedics;  Laterality: Left;   KNEE ARTHROPLASTY Right 10/28/2021   Procedure: COMPUTER ASSISTED TOTAL KNEE ARTHROPLASTY;  Surgeon: Mardee Lynwood SQUIBB, MD;  Location: [facility];  Service: Orthopedics;  Laterality: Right;   TEE WITHOUT CARDIOVERSION N/A 11/07/2020   Procedure: TRANSESOPHAGEAL ECHOCARDIOGRAM (TEE);  Surgeon: Cave Redell, MD;  Location: [facility];  Service: Cardiovascular;  Laterality: N/A;   Family History  Problem Relation Age of Onset   Heart disease Mother        died when pt was 27 y.o    Heart Problems Mother    Heart disease Father    Alzheimer's disease Sister    Social History   Socioeconomic History   Marital status: Widowed    Spouse name: Not on file   Number of children: Not on file   Years of education: Not on file   Highest education level: Bachelor's degree (e.g., BA, AB, BS)  Occupational History   Not on file  Tobacco Use   Smoking status: Never   Smokeless tobacco: Never  Vaping Use   Vaping status: Never Used  Substance and Sexual Activity   Alcohol use: Not Currently   Drug use: Never   Sexual activity: Not on file  Other Topics Concern   Not on file  Social History Narrative   From [place] lived in [place]  since late 1990s early 2000    Lives with daughter    Automotive Engineer ed    Former runner, broadcasting/film/video    No guns, wears seat belt, safe in relationship    Widowed       2 daughters 1/2 in [employer]    Social Drivers of Health   Financial Resource Strain: Low Risk  (08/10/2023)   Overall Financial Resource Strain (CARDIA)    Difficulty of Paying Living Expenses: Not hard at all  Food Insecurity: No Food Insecurity (08/10/2023)   Hunger Vital Sign    Worried About Running Out of Food in the  Last Year: Never true    Ran Out of Food in the Last Year: Never true  Transportation Needs: No Transportation Needs (08/10/2023)   PRAPARE - Administrator, Civil Service (Medical): No    Lack of Transportation (Non-Medical): No  Physical Activity: Inactive (08/10/2023)   Exercise Vital Sign    Days of Exercise per Week: 0 days    Minutes of Exercise per Session: 0 min  Stress: No Stress Concern Present (08/10/2023)   [employer] of Occupational Health - Occupational Stress Questionnaire    Feeling of Stress : Not  at all  Social Connections: Socially Isolated (08/10/2023)   Social Connection and Isolation Panel [NHANES]    Frequency of Communication with Friends and Family: More than three times a week    Frequency of Social Gatherings with Friends and Family: More than three times a week    Attends Religious Services: Never    Database Administrator or Organizations: No    Attends Banker Meetings: Never    Marital Status: Widowed    Tobacco Counseling Counseling given: Not Answered   Clinical Intake:  Pre-visit preparation completed: Yes  Pain : No/denies pain     BMI - recorded: 26.39 Nutritional Status: BMI 25 -29 Overweight Nutritional Risks: None Diabetes: No  How often do you need to have someone help you when you read instructions, pamphlets, or other written materials from your doctor or pharmacy?: 1 - Never  Interpreter Needed?: No  Information entered by :: R. Mete Purdum LPN   Activities of Daily Living    08/10/2023    8:55 AM  In your present state of health, do you have any difficulty performing the following activities:  Hearing? 0  Vision? 0  Comment glasses  Difficulty concentrating or making decisions? 1  Walking or climbing stairs? 0  Dressing or bathing? 0  Doing errands, shopping? 1  Preparing Food and eating ? N  Using the Toilet? N  In the past six months, have you accidently leaked urine? N  Do you have problems  with loss of bowel control? N  Managing your Medications? Y  Managing your Finances? Y  Housekeeping or managing your Housekeeping? N    Patient Care Team: Hope Merle, MD as PCP - General (Family Medicine) Darliss Rogue, MD as Consulting Physician (Cardiology) Cindie Ole DASEN, MD as Consulting Physician (Cardiology)  Indicate any recent Medical Services you may have received from other than [facility] providers in the past year (date may be approximate).     Assessment:   This is a routine wellness examination for [facility].  Hearing/Vision screen Hearing Screening - Comments:: No issues Vision Screening - Comments:: glasses   Goals Addressed             This Visit's Progress    Patient Stated       Wants to try to do cycle 3-4 times a weeks       Depression Screen    08/10/2023    9:02 AM 11/13/2022    8:20 AM 07/24/2022    3:40 PM 07/01/2022    9:05 AM 02/27/2022    1:41 PM 06/04/2021    2:26 PM 05/28/2020    3:13 PM  PHQ 2/9 Scores  PHQ - 2 Score 0 0 0 0 0 0 0  PHQ- 9 Score 0 0         Fall Risk    08/10/2023    8:57 AM 11/13/2022    8:20 AM 07/24/2022    3:39 PM 07/24/2022   12:18 PM 07/01/2022    9:05 AM  Fall Risk   Falls in the past year? 0 0 0 0 0  Number falls in past yr: 0 0   0  Injury with Fall? 0 0  0 0  Risk for fall due to : No Fall Risks Impaired mobility   No Fall Risks  Follow up Falls prevention discussed;Falls evaluation completed Falls evaluation completed   Falls evaluation completed    MEDICARE RISK AT HOME: Medicare Risk at Home Any stairs in  or around the home?: Yes If so, are there any without handrails?: No Home free of loose throw rugs in walkways, pet beds, electrical cords, etc?: Yes Adequate lighting in your home to reduce risk of falls?: Yes Life alert?: No Use of a cane, walker or w/c?: No Grab bars in the bathroom?: Yes Shower chair or bench in shower?: Yes Elevated toilet seat or a handicapped toilet?:  Yes  Cognitive Function:        08/10/2023    9:09 AM 07/24/2022    4:25 PM 06/04/2021    2:32 PM 05/28/2020    3:21 PM  6CIT Screen  What Year? 4 points 0 points 0 points 0 points  What month? 0 points 0 points 0 points 0 points  What time? 3 points 0 points 0 points   Count back from 20 0 points  0 points   Months in reverse 4 points  0 points 0 points  Repeat phrase 10 points  10 points 10 points  Total Score 21 points  10 points     Immunizations Immunization History  Administered Date(s) Administered   Fluad Quad(high Dose 65+) 05/26/2019   Influenza, High Dose Seasonal PF 04/22/2018, 04/21/2020   Influenza-Unspecified 05/20/2021, 05/17/2022, 05/03/2023   PFIZER(Purple Top)SARS-COV-2 Vaccination 09/12/2019, 10/03/2019, 08/07/2020   PNEUMOCOCCAL CONJUGATE-20 07/01/2022   Pneumococcal Conjugate-13 04/22/2018   Pneumococcal Polysaccharide-23 06/17/2019   Tdap 05/20/2021    TDAP status: Up to date  Flu Vaccine status: Up to date  Pneumococcal vaccine status: Up to date  Covid-19 vaccine status: Information provided on how to obtain vaccines.   Qualifies for Shingles Vaccine? Yes   Zostavax completed No   Shingrix  Completed?: No.    Education has been provided regarding the importance of this vaccine. Patient has been advised to call insurance company to determine out of pocket expense if they have not yet received this vaccine. Advised may also receive vaccine at local pharmacy or Health Dept. Verbalized acceptance and understanding. Has had one of the shingrix .  Screening Tests Health Maintenance  Topic Date Due   Zoster Vaccines- Shingrix  (1 of 2) Never done   COVID-19 Vaccine (4 - 2024-25 season) 03/29/2023   Medicare Annual Wellness (AWV)  07/25/2023   DTaP/Tdap/Td (2 - Td or Tdap) 05/21/2031   Pneumonia Vaccine 41+ Years old  Completed   INFLUENZA VACCINE  Completed   DEXA SCAN  Completed   HPV VACCINES  Aged Out    Health Maintenance  Health  Maintenance Due  Topic Date Due   Zoster Vaccines- Shingrix  (1 of 2) Never done   COVID-19 Vaccine (4 - 2024-25 season) 03/29/2023   Medicare Annual Wellness (AWV)  07/25/2023    Colorectal cancer screening: No longer required.   Mammogram status: Completed 12/2021. Repeat every year Wants to discuss with PCP to decide how often  Bone Density status: Completed 12/2018. Results reflect: Bone density results: OSTEOPENIA. Repeat every 2 years.  Lung Cancer Screening: (Low Dose CT Chest recommended if Age 29-80 years, 20 pack-year currently smoking OR have quit w/in 15years.) does not qualify.    Additional Screening:  Hepatitis C Screening: does not qualify; Completed NA age  Vision Screening: Recommended annual ophthalmology exams for early detection of glaucoma and other disorders of the eye. Is the patient up to date with their annual eye exam?  Yes  Who is the provider or what is the name of the office in which the patient attends annual eye exams? Dr. Myrla If pt is  not established with a provider, would they like to be referred to a provider to establish care? No .   Dental Screening: Recommended annual dental exams for proper oral hygiene    Community Resource Referral / Chronic Care Management: CRR required this visit?  No   CCM required this visit?  No     Plan:     I have personally reviewed and noted the following in the patient's chart:   Medical and social history Use of alcohol, tobacco or illicit drugs  Current medications and supplements including opioid prescriptions. Patient is not currently taking opioid prescriptions. Functional ability and status Nutritional status Physical activity Advanced directives List of other physicians Hospitalizations, surgeries, and ER visits in previous 12 months Vitals Screenings to include cognitive, depression, and falls Referrals and appointments  In addition, I have reviewed and discussed with patient certain  preventive protocols, quality metrics, and best practice recommendations. A written personalized care plan for preventive services as well as general preventive health recommendations were provided to patient.     Angeline Fredericks, LPN   [id number]   After Visit Summary: (MyChart) Due to this being a telephonic visit, the after visit summary with patients personalized plan was offered to patient via MyChart   Nurse Notes: None

## 2023-08-12 ENCOUNTER — Other Ambulatory Visit: Payer: Self-pay

## 2023-08-12 DIAGNOSIS — I4819 Other persistent atrial fibrillation: Secondary | ICD-10-CM

## 2023-08-12 MED ORDER — RIVAROXABAN 20 MG PO TABS: 20.0000 mg | ORAL_TABLET | Freq: Every day | ORAL | 5 refills | Status: DC

## 2023-08-12 NOTE — Telephone Encounter (Signed)
 Prescription refill request for Xarelto  received.  Indication:afib Last office visit:2/24 Weight:67.6  kg Age:83 Scr:0.67  12/24 CrCl:67.89  ml/min  Prescription refilled

## 2023-08-13 DIAGNOSIS — C4401 Basal cell carcinoma of skin of lip: Secondary | ICD-10-CM | POA: Diagnosis not present

## 2023-09-03 ENCOUNTER — Ambulatory Visit: Payer: Medicare Other | Attending: Cardiology | Admitting: Cardiology

## 2023-09-03 ENCOUNTER — Encounter: Payer: Self-pay | Admitting: Cardiology

## 2023-09-03 VITALS — BP 120/62 | HR 74 | Ht 63.0 in | Wt 151.0 lb

## 2023-09-03 DIAGNOSIS — I4892 Unspecified atrial flutter: Secondary | ICD-10-CM | POA: Insufficient documentation

## 2023-09-03 NOTE — Progress Notes (Signed)
 Cardiology Office Note:    Date:  09/03/2023   ID:  Kelli Smith, DOB 01/22/1941, MRN [id number]  PCP:  Hope Merle, MD  Cardiologist:  Redell Cave, MD  Electrophysiologist:  None   Referring MD: Hope Merle, MD   Chief Complaint  Patient presents with   Follow-up    Patient denies new or acute cardiac problems/concerns today.      History of Present Illness:    Kelli Smith is a 83 y.o. female with a hx of arthritis, paroxysmal A. fib/flutter s/p DCCV 11/07/2020 who presents for follow-up.    Denies palpitations, chest pain or shortness of breath.  Tolerating Toprol -XL and Xarelto  as prescribed.  Denies any bleeding issues with Xarelto .  Amiodarone  was previously stopped due to abnormal thyroid  function.  Following up with endocrinologist.  Currently on methimazole .  Denies any cardiac concerns.   Prior notes TEE 10/2020 EF 50% Echocardiogram 08/2019 normal systolic function, mild LVH, EF 60 to 65%. Cardiac monitor on 09/2019 reviewed again showing paroxysmal atrial fibrillation.   Past Medical History:  Diagnosis Date   A-fib [facility])    a.) CHA2DS2-VASc = 5 (age x 2, sex, HTN, aortic plaque). b.) s/p DCCV (200J x 2) 11/07/2020. c.) rate/rhythm maintained on oral amiodarone ; chronically anticoagulated using rivaroxaban .   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/2020   B12 deficiency    Basal cell carcinoma    CAD (coronary artery disease)    CAD (coronary artery disease) 11/22/2020   Cyst of right kidney    DDD (degenerative disc disease), lumbar    Hematuria 04/05/2020   History of chicken pox    History of kidney stones    HTN (hypertension)    Knee pain, bilateral 06/11/2018   Long term current use of anticoagulant    a.) rivaroxaban    Memory loss    Microcytosis    Osteopenia    PAC (premature atrial contraction)     Pain of right heel 04/05/2020   Premature atrial contraction 09/20/2019   Primary osteoarthritis of both knees 09/30/2020   Right shoulder pain 06/11/2018   Thyromegaly    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    Past Surgical History:  Procedure Laterality Date   BUNIONECTOMY WITH HAMMERTOE RECONSTRUCTION  2009   CARDIOVERSION N/A 11/07/2020   Procedure: CARDIOVERSION (200J x 2); Location: [facility]; Surgeon: Redell Cave, MD   CATARACT EXTRACTION Bilateral    2013/2014   CESAREAN SECTION N/A 1978   KNEE ARTHROPLASTY Left 12/12/2020   Procedure: COMPUTER ASSISTED TOTAL KNEE ARTHROPLASTY;  Surgeon: Mardee Lynwood SQUIBB, MD;  Location: [facility] ORS;  Service: Orthopedics;  Laterality: Left;   KNEE ARTHROPLASTY Right 10/28/2021   Procedure: COMPUTER ASSISTED TOTAL KNEE ARTHROPLASTY;  Surgeon: Mardee Lynwood SQUIBB, MD;  Location: [facility] ORS;  Service: Orthopedics;  Laterality: Right;   TEE WITHOUT CARDIOVERSION N/A 11/07/2020   Procedure: TRANSESOPHAGEAL ECHOCARDIOGRAM (TEE);  Surgeon: Cave Redell, MD;  Location: [facility] ORS;  Service: Cardiovascular;  Laterality: N/A;    Current Medications: Current Meds  Medication Sig   acetaminophen  (TYLENOL ) 650 MG CR tablet Take 1,300 mg by mouth every 8 (eight) hours as needed for pain.   Ascorbic Acid  (VITAMIN C  PO) Take by mouth daily.   Calcium  Carbonate-Vit D-Min (CALCIUM  600+D3 PLUS MINERALS PO) Take 1,200 mg by mouth daily.   donepezil  (ARICEPT )  5 MG tablet Take 1 tablet (5 mg total) by mouth at bedtime.   furosemide  (LASIX ) 20 MG tablet Take 1 tablet (20 mg total) by mouth daily as needed. For swelling and shortness of breath.   Iron , Ferrous Sulfate , 325 (65 Fe) MG TABS Take 325 mg by mouth every other day.   methimazole  (TAPAZOLE ) 5 MG tablet Take by mouth.   metoprolol  succinate (TOPROL  XL) 25 MG 24 hr tablet Take 1 tablet (25 mg total) by mouth daily.   rivaroxaban  (XARELTO ) 20 MG TABS tablet Take 1 tablet (20 mg total) by mouth daily with  supper.     Allergies:   Patient has no known allergies.   Social History   Socioeconomic History   Marital status: Widowed    Spouse name: Not on file   Number of children: Not on file   Years of education: Not on file   Highest education level: Bachelor's degree (e.g., BA, AB, BS)  Occupational History   Not on file  Tobacco Use   Smoking status: Never   Smokeless tobacco: Never  Vaping Use   Vaping status: Never Used  Substance and Sexual Activity   Alcohol use: Not Currently   Drug use: Never   Sexual activity: Not on file  Other Topics Concern   Not on file  Social History Narrative   From [place] lived in [place]  since late 1990s early 2000    Lives with daughter    Automotive Engineer ed    Former runner, broadcasting/film/video    No guns, wears seat belt, safe in relationship    Widowed       2 daughters 1/2 in [employer]    Social Drivers of Health   Financial Resource Strain: Low Risk  (08/10/2023)   Overall Financial Resource Strain (CARDIA)    Difficulty of Paying Living Expenses: Not hard at all  Food Insecurity: No Food Insecurity (08/10/2023)   Hunger Vital Sign    Worried About Running Out of Food in the Last Year: Never true    Ran Out of Food in the Last Year: Never true  Transportation Needs: No Transportation Needs (08/10/2023)   PRAPARE - Administrator, Civil Service (Medical): No    Lack of Transportation (Non-Medical): No  Physical Activity: Inactive (08/10/2023)   Exercise Vital Sign    Days of Exercise per Week: 0 days    Minutes of Exercise per Session: 0 min  Stress: No Stress Concern Present (08/10/2023)   [employer] of Occupational Health - Occupational Stress Questionnaire    Feeling of Stress : Not at all  Social Connections: Socially Isolated (08/10/2023)   Social Connection and Isolation Panel [NHANES]    Frequency of Communication with Friends and Family: More than three times a week    Frequency of Social Gatherings with Friends and Family: More than  three times a week    Attends Religious Services: Never    Database Administrator or Organizations: No    Attends Banker Meetings: Never    Marital Status: Widowed     Family History: The patient's family history includes Alzheimer's disease in her sister; Heart Problems in her mother; Heart disease in her father and mother.  ROS:   Please see the history of present illness.     All other systems reviewed and are negative.  EKGs/Labs/Other Studies Reviewed:    The following studies were reviewed today:   EKG Interpretation Date/Time:  Thursday September 03 2023 11:32:27  EST Ventricular Rate:  74 PR Interval:  138 QRS Duration:  72 QT Interval:  374 QTC Calculation: 415 R Axis:   13  Text Interpretation: Normal sinus rhythm Nonspecific ST abnormality Confirmed by Darliss Rogue ([id number]) on 09/03/2023 11:41:48 AM    Recent Labs: 07/15/2023: ALT 14; BUN 21; Creatinine, Ser 0.67; Hemoglobin 12.4; Platelets 213.0; Potassium 5.0; Sodium 140  Recent Lipid Panel    Component Value Date/Time   CHOL 159 11/03/2022 0828   TRIG 80.0 11/03/2022 0828   HDL 56.20 11/03/2022 0828   CHOLHDL 3 11/03/2022 0828   VLDL 16.0 11/03/2022 0828   LDLCALC 86 11/03/2022 0828    Physical Exam:    VS:  BP 120/62 (BP Location: Right Arm, Patient Position: Sitting, Cuff Size: Large)   Pulse 74   Ht 5' 3 (1.6 m)   Wt 151 lb (68.5 kg)   SpO2 98%   BMI 26.75 kg/m     Wt Readings from Last 3 Encounters:  09/03/23 151 lb (68.5 kg)  08/10/23 149 lb (67.6 kg)  07/15/23 149 lb 6.4 oz (67.8 kg)     GEN:  Well nourished, well developed in no acute distress HEENT: Normal NECK: No JVD; No carotid bruits CARDIAC: Regular rate and rhythm, no murmurs RESPIRATORY:  Clear to auscultation without rales, wheezing or rhonchi  ABDOMEN: Soft, non-tender, non-distended MUSCULOSKELETAL:  No edema; No deformity  SKIN: Warm and dry NEUROLOGIC:  Alert and oriented x 3 PSYCHIATRIC:  Normal  affect   ASSESSMENT:    1. Atrial flutter, unspecified type (HCC)    PLAN:    In order of problems listed above:  Atrial flutter status post DC cardioversion 10/2020.  Maintaining sinus rhythm.  CHADS2 vasc score of 3 (age, gender).  Continue Toprol -XL 25 mg daily, continue Xarelto .  TEE 2022 EF 50%.  Amiodarone  previously stopped due to thyroid  dysfunction.  Repeat echo.  Follow-up yearly.  Medication Adjustments/Labs and Tests Ordered: Current medicines are reviewed at length with the patient today.  Concerns regarding medicines are outlined above.  Orders Placed This Encounter  Procedures   EKG 12-Lead   ECHOCARDIOGRAM COMPLETE    No orders of the defined types were placed in this encounter.    Patient Instructions  Medication Instructions:   Your physician recommends that you continue on your current medications as directed. Please refer to the Current Medication list given to you today.   *If you need a refill on your cardiac medications before your next appointment, please call your pharmacy*   Lab Work:  None Ordered  If you have labs (blood work) drawn today and your tests are completely normal, you will receive your results only by: MyChart Message (if you have MyChart) OR A paper copy in the mail If you have any lab test that is abnormal or we need to change your treatment, we will call you to review the results.   Testing/Procedures:  Your physician has requested that you have an echocardiogram in 6 months . Echocardiography is a painless test that uses sound waves to create images of your heart. It provides your doctor with information about the size and shape of your heart and how well your heart's chambers and valves are working. This procedure takes approximately one hour. There are no restrictions for this procedure. Please do NOT wear cologne, perfume, aftershave, or lotions (deodorant is allowed). Please arrive 15 minutes prior to your appointment  time.  Please note: We ask at that you not bring children  with you during ultrasound (echo/ vascular) testing. Due to room size and safety concerns, children are not allowed in the ultrasound rooms during exams. Our front office staff cannot provide observation of children in our lobby area while testing is being conducted. An adult accompanying a patient to their appointment will only be allowed in the ultrasound room at the discretion of the ultrasound technician under special circumstances. We apologize for any inconvenience.    Follow-Up: At [facility], you and your health needs are our priority.  As part of our continuing mission to provide you with exceptional heart care, we have created designated Provider Care Teams.  These Care Teams include your primary Cardiologist (physician) and Advanced Practice Providers (APPs -  Physician Assistants and Nurse Practitioners) who all work together to provide you with the care you need, when you need it.  We recommend signing up for the patient portal called MyChart.  Sign up information is provided on this After Visit Summary.  MyChart is used to connect with patients for Virtual Visits (Telemedicine).  Patients are able to view lab/test results, encounter notes, upcoming appointments, etc.  Non-urgent messages can be sent to your provider as well.   To learn more about what you can do with MyChart, go to [email or web address].    Your next appointment:   12 month(s)  Provider:   You may see Redell Cave, MD or one of the following Advanced Practice Providers on your designated Care Team:   Lonni Meager, NP Bernardino Bring, PA-C Cadence Franchester, PA-C Tylene Lunch, NP Barnie Hila, NP '   Signed, Redell Cave, MD  09/03/2023 12:50 PM    [facility]

## 2023-09-03 NOTE — Patient Instructions (Signed)
 Medication Instructions:   Your physician recommends that you continue on your current medications as directed. Please refer to the Current Medication list given to you today.   *If you need a refill on your cardiac medications before your next appointment, please call your pharmacy*   Lab Work:  None Ordered  If you have labs (blood work) drawn today and your tests are completely normal, you will receive your results only by: MyChart Message (if you have MyChart) OR A paper copy in the mail If you have any lab test that is abnormal or we need to change your treatment, we will call you to review the results.   Testing/Procedures:  Your physician has requested that you have an echocardiogram in 6 months . Echocardiography is a painless test that uses sound waves to create images of your heart. It provides your doctor with information about the size and shape of your heart and how well your heart's chambers and valves are working. This procedure takes approximately one hour. There are no restrictions for this procedure. Please do NOT wear cologne, perfume, aftershave, or lotions (deodorant is allowed). Please arrive 15 minutes prior to your appointment time.  Please note: We ask at that you not bring children with you during ultrasound (echo/ vascular) testing. Due to room size and safety concerns, children are not allowed in the ultrasound rooms during exams. Our front office staff cannot provide observation of children in our lobby area while testing is being conducted. An adult accompanying a patient to their appointment will only be allowed in the ultrasound room at the discretion of the ultrasound technician under special circumstances. We apologize for any inconvenience.    Follow-Up: At [facility], you and your health needs are our priority.  As part of our continuing mission to provide you with exceptional heart care, we have created designated Provider Care Teams.   These Care Teams include your primary Cardiologist (physician) and Advanced Practice Providers (APPs -  Physician Assistants and Nurse Practitioners) who all work together to provide you with the care you need, when you need it.  We recommend signing up for the patient portal called MyChart.  Sign up information is provided on this After Visit Summary.  MyChart is used to connect with patients for Virtual Visits (Telemedicine).  Patients are able to view lab/test results, encounter notes, upcoming appointments, etc.  Non-urgent messages can be sent to your provider as well.   To learn more about what you can do with MyChart, go to [email or web address].    Your next appointment:   12 month(s)  Provider:   You may see Redell Cave, MD or one of the following Advanced Practice Providers on your designated Care Team:   Lonni Meager, NP Bernardino Bring, PA-C Cadence Franchester, PA-C Tylene Lunch, NP Barnie Hila, NP '

## 2023-09-07 ENCOUNTER — Other Ambulatory Visit: Payer: Self-pay

## 2023-09-07 MED ORDER — METOPROLOL SUCCINATE ER 25 MG PO TB24: 25.0000 mg | ORAL_TABLET | Freq: Every day | ORAL | 3 refills | Status: DC

## 2023-09-07 NOTE — Telephone Encounter (Signed)
 Requested Prescriptions   Signed Prescriptions Disp Refills   metoprolol  succinate (TOPROL  XL) 25 MG 24 hr tablet 90 tablet 3    Sig: Take 1 tablet (25 mg total) by mouth daily.    Authorizing Provider: Constancia Delton    Ordering User: Duayne Gey   Last office visit:  09/03/23 with plan to f/u in 12 months Next office visit: none/does have active recall

## 2023-09-21 ENCOUNTER — Ambulatory Visit (INDEPENDENT_AMBULATORY_CARE_PROVIDER_SITE_OTHER): Payer: Medicare Other | Admitting: Podiatry

## 2023-09-21 DIAGNOSIS — Z91198 Patient's noncompliance with other medical treatment and regimen for other reason: Secondary | ICD-10-CM

## 2023-09-21 NOTE — Progress Notes (Signed)
 1. Failure to attend appointment with reason given    Patient rescheduled appt to Feb 27th.

## 2023-09-24 ENCOUNTER — Ambulatory Visit (INDEPENDENT_AMBULATORY_CARE_PROVIDER_SITE_OTHER): Payer: Medicare Other | Admitting: Podiatry

## 2023-09-24 ENCOUNTER — Encounter: Payer: Self-pay | Admitting: Podiatry

## 2023-09-24 DIAGNOSIS — L84 Corns and callosities: Secondary | ICD-10-CM

## 2023-09-24 DIAGNOSIS — M79674 Pain in right toe(s): Secondary | ICD-10-CM

## 2023-09-24 DIAGNOSIS — B351 Tinea unguium: Secondary | ICD-10-CM | POA: Diagnosis not present

## 2023-09-24 DIAGNOSIS — M79675 Pain in left toe(s): Secondary | ICD-10-CM

## 2023-09-24 NOTE — Progress Notes (Signed)
  Subjective:  Patient ID: Kelli Smith, female    DOB: 1941-06-13,  MRN: [id number]  83 y.o. female presents painful mycotic toenails x 10 which interfere with daily activities. Pain is relieved with periodic professional debridement.  Chief Complaint  Patient presents with   Nail Problem    "Clip my toenails."   New problem(s): None   PCP is Dana Allan, MD.  No Known Allergies  Review of Systems: Negative except as noted in the HPI.   Objective:  Kelli Smith is a pleasant 83 y.o. female WD, WN in NAD. AAO x 3.  Vascular Examination: Vascular status intact b/l with palpable pedal pulses. CFT immediate b/l. Pedal hair present. No edema. No pain with calf compression b/l. Skin temperature gradient WNL b/l. No varicosities noted. No cyanosis or clubbing noted.  Neurological Examination: Sensation grossly intact b/l with 10 gram monofilament. Vibratory sensation intact b/l.  Dermatological Examination: Pedal skin with normal turgor, texture and tone b/l. No open wounds nor interdigital macerations noted. Toenails 1-5 b/l thick, discolored, elongated with subungual debris and pain on dorsal palpation.   Hyperkeratotic lesion(s) left great toe.  No erythema, no edema, no drainage, no fluctuance.  Musculoskeletal Examination: Muscle strength 5/5 to b/l LE.  No pain, crepitus noted b/l. Hammertoe deformity noted 2-5 b/l.  Radiographs: None Last A1c:      Latest Ref Rng & Units 11/03/2022    8:28 AM  Hemoglobin A1C  Hemoglobin-A1c 4.6 - 6.5 % 5.5      Assessment:   1. Pain due to onychomycosis of toenails of both feet   2. Callus    Plan:  -Patient's family member present. All questions/concerns addressed on today's visit. -Consent given for treatment as described below: -Examined patient. -Mycotic toenails 1-5 bilaterally were debrided in length and girth with sterile nail nippers and dremel without incident. -Callus(es) left great toe pared utilizing sterile scalpel  blade without complication or incident. Total number debrided =1. -Patient/POA to call should there be question/concern in the interim.  Return in about 3 months (around 12/22/2023).  Freddie Breech, DPM      [place] LOCATION: [address], [place] [address]                   Office [phone]   [facility] LOCATION: [address] [place], [place] [address] Office [phone]

## 2023-09-27 ENCOUNTER — Encounter: Payer: Self-pay | Admitting: Podiatry

## 2023-10-06 DIAGNOSIS — E0590 Thyrotoxicosis, unspecified without thyrotoxic crisis or storm: Secondary | ICD-10-CM | POA: Diagnosis not present

## 2023-10-29 DIAGNOSIS — Z96651 Presence of right artificial knee joint: Secondary | ICD-10-CM | POA: Diagnosis not present

## 2023-10-29 DIAGNOSIS — Z96652 Presence of left artificial knee joint: Secondary | ICD-10-CM | POA: Diagnosis not present

## 2023-12-28 ENCOUNTER — Ambulatory Visit: Payer: Medicare Other | Admitting: Podiatry

## 2024-01-13 ENCOUNTER — Ambulatory Visit: Payer: Medicare Other | Admitting: Family Medicine

## 2024-01-15 ENCOUNTER — Ambulatory Visit (INDEPENDENT_AMBULATORY_CARE_PROVIDER_SITE_OTHER): Admitting: Podiatry

## 2024-01-15 ENCOUNTER — Encounter: Payer: Self-pay | Admitting: Podiatry

## 2024-01-15 VITALS — Ht 63.0 in | Wt 151.0 lb

## 2024-01-15 DIAGNOSIS — M79674 Pain in right toe(s): Secondary | ICD-10-CM

## 2024-01-15 DIAGNOSIS — B351 Tinea unguium: Secondary | ICD-10-CM

## 2024-01-15 DIAGNOSIS — M79675 Pain in left toe(s): Secondary | ICD-10-CM | POA: Diagnosis not present

## 2024-01-15 NOTE — Progress Notes (Signed)
  Subjective:  Patient ID: Kelli Smith, female    DOB: [birth year],  MRN: [id number]  83 y.o. female presents to clinic with  painful thick toenails that are difficult to trim. Pain interferes with ambulation. Aggravating factors include wearing enclosed shoe gear. Pain is relieved with periodic professional debridement.  Chief Complaint  Patient presents with   Nail Problem    Pt is here for [facility] PCP is Dr Sueanne Emerald and LOV was in January.    New problem(s): None   PCP is Valli Gaw, MD.  No Known Allergies  Review of Systems: Negative except as noted in the HPI.   Objective:  Kelli Smith is a pleasant 83 y.o. female WD, WN in NAD. AAO x 3.  Vascular Examination: CFT immediate b/l LE. Palpable DP/PT pulses b/l LE. Digital hair sparse b/l. Skin temperature gradient WNL b/l. No pain with calf compression b/l. No edema noted b/l. No cyanosis or clubbing noted b/l LE.  Neurological Examination: Sensation grossly intact b/l with 10 gram monofilament. Vibratory sensation intact b/l.   Dermatological Examination: Pedal skin with normal turgor, texture and tone b/l. Toenails 1-5 b/l thick, discolored, elongated with subungual debris and pain on dorsal palpation.  Minimal hyperkeratos(is/es) noted distal tip of right 3rd toe and plantar IPJ of left great toe.  Musculoskeletal Examination: Muscle strength 5/5 to b/l LE. HAV with bunion bilaterally and hammertoes 2-5 b/l.  Radiographs: None  Last A1c:       No data to display           Assessment:   1. Pain due to onychomycosis of toenails of both feet    Plan:  -Patient was evaluated today. All questions/concerns addressed on today's visit. -Patient to continue soft, supportive shoe gear daily. -Toenails 1-5 b/l were debrided in length and girth with sterile nail nippers and dremel without iatrogenic bleeding.  -As a courtesy, callus(es) distal tip of right 3rd toe and plantar IPJ of left great toe gently filed without  complication or incident. Total number pared=2. -Patient/POA to call should there be question/concern in the interim.  Return in about 3 months (around 04/16/2024).  Kelli Smith, DPM      [place] LOCATION: [address] [place] [address]                   Office [phone]   [facility] LOCATION: [address] [place], [place] [address] Office [phone]

## 2024-01-20 ENCOUNTER — Ambulatory Visit (INDEPENDENT_AMBULATORY_CARE_PROVIDER_SITE_OTHER): Admitting: Internal Medicine

## 2024-01-20 ENCOUNTER — Emergency Department

## 2024-01-20 ENCOUNTER — Encounter: Payer: Self-pay | Admitting: Internal Medicine

## 2024-01-20 ENCOUNTER — Emergency Department
Admission: EM | Admit: 2024-01-20 | Discharge: 2024-01-20 | Disposition: A | Discharge status: Home / Self Care | Attending: Emergency Medicine | Admitting: Emergency Medicine

## 2024-01-20 VITALS — BP 108/72 | HR 148 | Temp 97.8°F | Ht 63.0 in | Wt 145.2 lb

## 2024-01-20 DIAGNOSIS — R000 Tachycardia, unspecified: Secondary | ICD-10-CM | POA: Diagnosis present

## 2024-01-20 DIAGNOSIS — I480 Paroxysmal atrial fibrillation: Secondary | ICD-10-CM

## 2024-01-20 DIAGNOSIS — R413 Other amnesia: Secondary | ICD-10-CM

## 2024-01-20 DIAGNOSIS — J189 Pneumonia, unspecified organism: Secondary | ICD-10-CM

## 2024-01-20 DIAGNOSIS — I4892 Unspecified atrial flutter: Secondary | ICD-10-CM | POA: Insufficient documentation

## 2024-01-20 DIAGNOSIS — I3481 Nonrheumatic mitral (valve) annulus calcification: Secondary | ICD-10-CM | POA: Diagnosis not present

## 2024-01-20 DIAGNOSIS — J181 Lobar pneumonia, unspecified organism: Secondary | ICD-10-CM | POA: Insufficient documentation

## 2024-01-20 DIAGNOSIS — Z7901 Long term (current) use of anticoagulants: Secondary | ICD-10-CM | POA: Diagnosis not present

## 2024-01-20 DIAGNOSIS — E049 Nontoxic goiter, unspecified: Secondary | ICD-10-CM | POA: Diagnosis not present

## 2024-01-20 DIAGNOSIS — R918 Other nonspecific abnormal finding of lung field: Secondary | ICD-10-CM | POA: Diagnosis not present

## 2024-01-20 LAB — CBC
HCT: 37.6 % (ref 36.0–46.0)
Hemoglobin: 12 g/dL (ref 12.0–15.0)
MCH: 23.9 pg — ABNORMAL LOW (ref 26.0–34.0)
MCHC: 31.9 g/dL (ref 30.0–36.0)
MCV: 74.8 fL — ABNORMAL LOW (ref 80.0–100.0)
Platelets: 209 10*3/uL (ref 150–400)
RBC: 5.03 MIL/uL (ref 3.87–5.11)
RDW: 15.8 % — ABNORMAL HIGH (ref 11.5–15.5)
WBC: 7.4 10*3/uL (ref 4.0–10.5)
nRBC: 0 % (ref 0.0–0.2)

## 2024-01-20 LAB — URINALYSIS, ROUTINE W REFLEX MICROSCOPIC
Bilirubin Urine: NEGATIVE
Glucose, UA: NEGATIVE mg/dL
Hgb urine dipstick: NEGATIVE
Ketones, ur: NEGATIVE mg/dL
Leukocytes,Ua: NEGATIVE
Nitrite: NEGATIVE
Protein, ur: NEGATIVE mg/dL
Specific Gravity, Urine: 1.004 — ABNORMAL LOW (ref 1.005–1.030)
pH: 7 (ref 5.0–8.0)

## 2024-01-20 LAB — T4, FREE: Free T4: 1.26 ng/dL — ABNORMAL HIGH (ref 0.61–1.12)

## 2024-01-20 LAB — MAGNESIUM: Magnesium: 2.2 mg/dL (ref 1.7–2.4)

## 2024-01-20 LAB — COMPREHENSIVE METABOLIC PANEL WITH GFR
ALT: 37 U/L (ref 0–44)
AST: 41 U/L (ref 15–41)
Albumin: 3.6 g/dL (ref 3.5–5.0)
Alkaline Phosphatase: 77 U/L (ref 38–126)
Anion gap: 9 (ref 5–15)
BUN: 19 mg/dL (ref 8–23)
CO2: 22 mmol/L (ref 22–32)
Calcium: 9.5 mg/dL (ref 8.9–10.3)
Chloride: 107 mmol/L (ref 98–111)
Creatinine, Ser: 0.83 mg/dL (ref 0.44–1.00)
GFR, Estimated: >60 mL/min (ref >60)
Glucose, Bld: 96 mg/dL (ref 70–99)
Potassium: 4 mmol/L (ref 3.5–5.1)
Sodium: 138 mmol/L (ref 135–145)
Total Bilirubin: 1.2 mg/dL (ref 0.0–1.2)
Total Protein: 6.8 g/dL (ref 6.5–8.1)

## 2024-01-20 LAB — PROCALCITONIN: Procalcitonin: <0.1 ng/mL

## 2024-01-20 LAB — LACTIC ACID, PLASMA: Lactic Acid, Venous: 1.6 mmol/L (ref 0.5–1.9)

## 2024-01-20 LAB — TSH: TSH: 2.344 u[IU]/mL (ref 0.350–4.500)

## 2024-01-20 MED ORDER — LACTATED RINGERS IV BOLUS
500.0000 mL | Freq: Once | INTRAVENOUS | Status: AC
  Administered 2024-01-20: 500 mL via INTRAVENOUS

## 2024-01-20 MED ORDER — DOXYCYCLINE HYCLATE 100 MG PO TABS: 100.0000 mg | ORAL_TABLET | Freq: Two times a day (BID) | ORAL | 0 refills | Status: AC

## 2024-01-20 MED ORDER — SODIUM CHLORIDE 0.9 % IV SOLN
500.0000 mg | Freq: Once | INTRAVENOUS | Status: AC
  Administered 2024-01-20: 500 mg via INTRAVENOUS
  Filled 2024-01-20: qty 5

## 2024-01-20 MED ORDER — FUROSEMIDE 10 MG/ML IJ SOLN
60.0000 mg | Freq: Once | INTRAMUSCULAR | Status: AC
  Administered 2024-01-20: 60 mg via INTRAVENOUS
  Filled 2024-01-20: qty 8

## 2024-01-20 MED ORDER — METOPROLOL TARTRATE 5 MG/5ML IV SOLN
5.0000 mg | Freq: Once | INTRAVENOUS | Status: AC
  Administered 2024-01-20: 5 mg via INTRAVENOUS
  Filled 2024-01-20: qty 5

## 2024-01-20 MED ORDER — DONEPEZIL HCL 5 MG PO TABS: 5.0000 mg | ORAL_TABLET | Freq: Every day | ORAL | 3 refills | Status: DC

## 2024-01-20 MED ORDER — SODIUM CHLORIDE 0.9 % IV SOLN
2.0000 g | Freq: Once | INTRAVENOUS | Status: AC
  Administered 2024-01-20: 2 g via INTRAVENOUS
  Filled 2024-01-20: qty 20

## 2024-01-20 MED ORDER — METOPROLOL TARTRATE 25 MG PO TABS
25.0000 mg | ORAL_TABLET | Freq: Once | ORAL | Status: AC
  Administered 2024-01-20: 25 mg via ORAL
  Filled 2024-01-20: qty 1

## 2024-01-20 MED ORDER — ETOMIDATE 2 MG/ML IV SOLN
5.0000 mg | Freq: Once | INTRAVENOUS | Status: AC
  Administered 2024-01-20: 5 mg via INTRAVENOUS
  Filled 2024-01-20: qty 10

## 2024-01-20 NOTE — ED Provider Notes (Addendum)
 [facility] Provider Note    Event Date/Time   First MD Initiated Contact with Patient 01/20/24 1503     (approximate)   History   Tachycardia   HPI  Kelli Smith is a 83 y.o. female who presents to the ED for evaluation of Tachycardia   I review cardiology clinic visit from February.  History of paroxysmal A-fib and flutter, cardioverted in the past 2022.  On Xarelto .  On methimazole for hyperthyroidism.  Patient presents to the ED asymptomatic due to elevated heart rates.  She has no explicit concern and reports feeling fine.  Does not feel that she is in atrial fibrillation.  No preceding illnesses, dysuria, abdominal pain, emesis, cough, chest pain, syncope or dizziness   Physical Exam   Triage Vital Signs: ED Triage Vitals  Encounter Vitals Group     BP 01/20/24 1451 (!) 131/91     Girls Systolic BP Percentile --      Girls Diastolic BP Percentile --      Boys Systolic BP Percentile --      Boys Diastolic BP Percentile --      Pulse Rate 01/20/24 1449 (!) 149     Resp 01/20/24 1449 18     Temp 01/20/24 1449 98.6 F (37 C)     Temp Source 01/20/24 1449 Oral     SpO2 01/20/24 1449 96 %     Weight 01/20/24 1450 145 lb 4.5 oz (65.9 kg)     Height 01/20/24 1450 5' 3 (1.6 m)     Head Circumference --      Peak Flow --      Pain Score 01/20/24 1450 0     Pain Loc --      Pain Education --      Exclude from Growth Chart --     Most recent vital signs: Vitals:   01/20/24 1900 01/20/24 1901  BP: 92/64   Pulse: (!) 113 (!) 102  Resp: 18 18  Temp:    SpO2: 100% 100%    General: Awake, no distress.  CV:  Good peripheral perfusion.  Tachycardic and irregular Resp:  Normal effort.  Abd:  No distention.  MSK:  No deformity noted.  Neuro:  No focal deficits appreciated. Other:     ED Results / Procedures / Treatments   Labs (all labs ordered are listed, but only abnormal results are displayed) Labs Reviewed  CBC - Abnormal;  Notable for the following components:      Result Value   MCV 74.8 (*)    MCH 23.9 (*)    RDW 15.8 (*)    All other components within normal limits  URINALYSIS, ROUTINE W REFLEX MICROSCOPIC - Abnormal; Notable for the following components:   Color, Urine COLORLESS (*)    APPearance CLEAR (*)    Specific Gravity, Urine 1.004 (*)    All other components within normal limits  CULTURE, BLOOD (ROUTINE X 2)  CULTURE, BLOOD (ROUTINE X 2)  COMPREHENSIVE METABOLIC PANEL WITH GFR  MAGNESIUM   LACTIC ACID, PLASMA  PROCALCITONIN  TSH  T4, FREE    EKG Atrial flutter with 2 1 block, rate 149 bpm.  No STEMI.  RADIOLOGY 1 view CXR with a right basilar infiltrate interpreted by me  Official radiology report(s): DG Chest Port 1 View Result Date: 01/20/2024 CLINICAL DATA:  Tachycardia. EXAM: PORTABLE CHEST 1 VIEW COMPARISON:  CT chest 11/21/2020. FINDINGS: Trachea is midline. Heart is enlarged. Dense mitral annulus calcification. Thoracic  aorta is calcified. Enlarged thyroid  extends into the right paratracheal region. Mild right basilar airspace opacification. No pleural fluid. Degenerative changes in the shoulders with high-riding humeral heads bilaterally, indicative of chronic rotator cuff tears. IMPRESSION: 1. Right basilar airspace opacification may be due to aspiration or pneumonia. 2. Thyroid  goiter. Electronically Signed   By: Newell Eke M.D.   On: 01/20/2024 15:37    PROCEDURES and INTERVENTIONS:  .1-3 Lead EKG Interpretation  Performed by: Claudene Rover, MD Authorized by: Claudene Rover, MD     Interpretation: abnormal     ECG rate:  149   ECG rate assessment: tachycardic     Rhythm: atrial flutter     Ectopy: none     Conduction: normal   .Critical Care  Performed by: Claudene Rover, MD Authorized by: Claudene Rover, MD   Critical care provider statement:    Critical care time (minutes):  75   Critical care time was exclusive of:  Separately billable procedures and treating  other patients   Critical care was necessary to treat or prevent imminent or life-threatening deterioration of the following conditions:  Cardiac failure and circulatory failure   Critical care was time spent personally by me on the following activities:  Development of treatment plan with patient or surrogate, discussions with consultants, evaluation of patient's response to treatment, examination of patient, ordering and review of laboratory studies, ordering and review of radiographic studies, ordering and performing treatments and interventions, pulse oximetry, re-evaluation of patient's condition and review of old charts .Sedation  Date/Time: 01/20/2024 8:23 PM  Performed by: Claudene Rover, MD Authorized by: Claudene Rover, MD   Consent:    Consent obtained:  Verbal   Consent given by:  Patient (daughter) Universal protocol:    Immediately prior to procedure, a time out was called: yes   Pre-sedation assessment:    Time since last food or drink:  4hrs   ASA classification: class 2 - patient with mild systemic disease     Mallampati score:  II - soft palate, uvula, fauces visible   Pre-sedation assessments completed and reviewed: pre-procedure airway patency not reviewed   A pre-sedation assessment was completed prior to the start of the procedure Immediate pre-procedure details:    Reassessment: Patient reassessed immediately prior to procedure     Reviewed: vital signs, relevant labs/tests and NPO status     Verified: bag valve mask available, emergency equipment available, intubation equipment available, IV patency confirmed, oxygen available, reversal medications available and suction available   Procedure details (see MAR for exact dosages):    Preoxygenation:  Nasal cannula   Sedation:  Etomidate   Intended level of sedation: deep   Intra-procedure monitoring:  Blood pressure monitoring, cardiac monitor, continuous pulse oximetry, frequent vital sign checks, frequent LOC assessments  and continuous capnometry   Intra-procedure events: none     Total Provider sedation time (minutes):  15 Post-procedure details:   A post-sedation assessment was completed following the completion of the procedure.   Attendance: Constant attendance by certified staff until patient recovered     Recovery: Patient returned to pre-procedure baseline     Patient is stable for discharge or admission: yes     Procedure completion:  Tolerated well, no immediate complications .Cardioversion  Date/Time: 01/20/2024 8:24 PM  Performed by: Claudene Rover, MD Authorized by: Claudene Rover, MD   Consent:    Consent obtained:  Verbal   Consent given by:  Patient (daughter) Pre-procedure details:    Rhythm:  Atrial  flutter   Electrode placement:  Anterior-posterior Patient sedated: Yes. Refer to sedation procedure documentation for details of sedation.  Attempt one:    Cardioversion mode:  Synchronous   Shock (Joules):  200   Shock outcome:  Conversion to normal sinus rhythm Post-procedure details:    Patient status:  Awake   Patient tolerance of procedure:  Tolerated well, no immediate complications   Medications  metoprolol  tartrate (LOPRESSOR ) tablet 25 mg (25 mg Oral Given 01/20/24 1553)  metoprolol  tartrate (LOPRESSOR ) injection 5 mg (5 mg Intravenous Given 01/20/24 1552)  lactated ringers  bolus 500 mL (0 mLs Intravenous Stopped 01/20/24 1556)  furosemide  (LASIX ) injection 60 mg (60 mg Intravenous Given 01/20/24 1611)  cefTRIAXone (ROCEPHIN) 2 g in sodium chloride  0.9 % 100 mL IVPB (0 g Intravenous Stopped 01/20/24 1717)  azithromycin (ZITHROMAX) 500 mg in sodium chloride  0.9 % 250 mL IVPB (0 mg Intravenous Stopped 01/20/24 1819)  metoprolol  tartrate (LOPRESSOR ) injection 5 mg (5 mg Intravenous Given 01/20/24 1707)  etomidate (AMIDATE) injection 5 mg (5 mg Intravenous Given 01/20/24 1825)     IMPRESSION / MDM / ASSESSMENT AND PLAN / ED COURSE  I reviewed the triage vital signs and the nursing  notes.  Differential diagnosis includes, but is not limited to, dehydration, hypokalemia, sepsis, UTI or pneumonia, medication noncompliance  {Patient presents with symptoms of an acute illness or injury that is potentially life-threatening.  Patient presents asymptomatic in rapid atrial flutter.  Rapid rates but no signs of shock.  Look systemically well and is asymptomatic.  Workup is quite benign.  Normal CBC, metabolic panel, electrolytes, UA.  Her CXR demonstrates a right basilar infiltrate so she is provided antibiotics and sepsis protocols are added.  Cultures are drawn.  Normal lactic acid and negative procalcitonin.  She had no preceding cough.  Does look mildly volume up on exam and so does receive a single dose of IV Lasix  with good urinary output.  I certainly considered admission but with how reassuring the majority of her workup is, her asymptomatic nature, as below, I discussed procedural sedation to facilitate a synchronized cardioversion.  After discussing risks and benefits she is agreeable and daughter is in agreement.  Cardioverted to sinus rhythm and maintains a sinus rhythm for couple hours after this while being observed.  Returns to baseline and suitable for outpatient management.  Clinical Course as of 01/20/24 2022  Wed Jan 20, 2024  1600 Notably, lactated Ringer's was not provided.  We stopped this as soon as it was initiated after my exam with the patient noting volume overload and instead provided Lasix . [DS]  1624 X-ray results noted.  Concerning for pneumonia.  Certainly makes SIRS criteria with her vital signs.  Will draw cultures, lactic acid per protocol and provide antibiotics. [DS]  1625 Will abstain from typical fluid bolus considering her volume overloaded status [DS]  1656 Reassessed and discussed x-ray findings and plan of care.  They are agreeable [DS]  1737 Reassessed.  Remains asymptomatic but rapid atrial flutter.  We discussed the possibility of  attempting cardioversion.  We discussed the limitations of this in the setting of possible pneumonia and that she may revert back to atrial flutter due to this physiologic stress.  We discussed risks, benefits and alternatives.  She is eager to not be admitted to the hospital and after discussing risks with patient and daughter they are agreeable to pursue sedation and electrical cardioversion.  I update nursing staff of this [DS]  1833 Troponins cardioversion x  1 seems to return the patient back to normal sinus rhythm. [DS]  1857 Repeat EKG with a normal sinus rhythm, rate of 70 bpm. [DS]  1907 Reassessed, doing well and back to baseline.  Discussed plan of care and hopeful outpatient management.  She is agreeable. [DS]  2018 Reassessed, still at baseline and doing well.  There were some unfortunate delays getting the thyroid  studies done at the lab.  Apparently when I called earlier to add on the studies the blood was never given to the appropriate person in the lab to perform the study.  They say it will be about 30 minutes-an hour.  Patient does not want to wait since she reports feeling fine.  We discussed cardiology follow-up. [DS]    Clinical Course User Index [DS] Claudene Rover, MD     FINAL CLINICAL IMPRESSION(S) / ED DIAGNOSES   Final diagnoses:  Atrial flutter with rapid ventricular response (HCC)  Community acquired pneumonia of right lower lobe of lung     Rx / DC Orders   ED Discharge Orders          Ordered    doxycycline (VIBRA-TABS) 100 MG tablet  2 times daily        01/20/24 1929             Note:  This document was prepared using Dragon voice recognition software and may include unintentional dictation errors.   Claudene Rover, MD 01/20/24 7973    Claudene Rover, MD 01/20/24 2026

## 2024-01-20 NOTE — Assessment & Plan Note (Signed)
-   Patient does have a history of memory loss -She will need a formal cognitive evaluation at her next visit to determine if she does have dementia (mini cog versus MMSE) -Will continue with Aricept  for now.  Refill sent in today -No further workup at this time

## 2024-01-20 NOTE — Sepsis Progress Note (Signed)
 eLink is following this Code Sepsis.

## 2024-01-20 NOTE — Consult Note (Signed)
 CODE SEPSIS - PHARMACY COMMUNICATION  **Broad Spectrum Antibiotics should be administered within 1 hour of Sepsis diagnosis**  Time Code Sepsis Called/Page Received: 1624  Antibiotics Ordered: ceftriaxone + azithromycin  Time of 1st antibiotic administration: 1641  Additional action taken by pharmacy: none     Cathaleen GORMAN Blanch ,PharmD Clinical Pharmacist  01/20/2024  4:50 PM

## 2024-01-20 NOTE — ED Notes (Signed)
 Cardioversion performed with 200J shock with Claudene, MD

## 2024-01-20 NOTE — Progress Notes (Signed)
 Acute Office Visit  Subjective:     Patient ID: Kelli Smith, female    DOB: [birth year], 83 y.o.   MRN: [id number]  Chief Complaint  Patient presents with   Medical Management of Chronic Issues    HPI Patient is in today for routine follow-up of her chronic medical issues.  However, on vitals today she was noted to have heart rates in the 140s to 150s.  She denies any symptoms.  No headache, no shortness of breath, no chest pain, no lightheadedness, syncope.  Daughter does report an episode of shortness of breath a couple of days ago while eating but nothing currently.  No fevers or chills.  EKG done show showed 2:1 flutter at a heart rate of 150.  Patient was instructed to go to the hospital for further evaluation.  She declined ambulance and daughter stated that she will transport her.  Review of Systems  Constitutional: Negative.   HENT: Negative.    Respiratory: Negative.  Negative for shortness of breath.   Cardiovascular: Negative.  Negative for chest pain and palpitations.  Musculoskeletal: Negative.   Neurological: Negative.  Negative for dizziness and loss of consciousness.  Psychiatric/Behavioral: Negative.          Objective:    BP 108/72   Pulse (!) 148   Temp 97.8 F (36.6 C)   Ht 5' 3 (1.6 m)   Wt 145 lb 3.2 oz (65.9 kg)   SpO2 99%   BMI 25.72 kg/m    Physical Exam Constitutional:      Appearance: Normal appearance.  HENT:     Head: Normocephalic and atraumatic.   Cardiovascular:     Rate and Rhythm: Tachycardia present.     Comments: Patient tachycardic in the 150s Pulmonary:     Breath sounds: Normal breath sounds. No wheezing or rales.   Musculoskeletal:        General: No swelling or tenderness.   Neurological:     Mental Status: She is alert. Mental status is at baseline.   Psychiatric:        Mood and Affect: Mood normal.        Behavior: Behavior normal.     No results found for any visits on 01/20/24.      Assessment &  Plan:   Problem List Items Addressed This Visit       Cardiovascular and Mediastinum   Atrial fibrillation (HCC)   - Patient was incidentally noted to have a heart rate in the 150s on vitals -She denied any chest pain or shortness of breath or headaches or lightheadedness or syncope -EKG showed A-fib/2-1 flutter at a rate of 150 -Patient does follow with cardiology and was in sinus rhythm at her last appointment in February with them -Patient was asked to go to the ED for further evaluation.  They declined an ambulance and daughter states that she will transport the patient to [facility] -Will send a message to the cardiologist as well -No further workup at this time      Relevant Orders   EKG 12-Lead (Completed)     Other   Memory loss   - Patient does have a history of memory loss -She will need a formal cognitive evaluation at her next visit to determine if she does have dementia (mini cog versus MMSE) -Will continue with Aricept  for now.  Refill sent in today -No further workup at this time      Relevant Medications   donepezil  (ARICEPT ) 5  MG tablet   Tachycardia - Primary   - Patient noted to have significant tachycardia on routine vitals and had an EKG which showed 2-1 flutter -She was referred to the ED for further evaluation -Please see my note under A-fib       Meds ordered this encounter  Medications   donepezil  (ARICEPT ) 5 MG tablet    Sig: Take 1 tablet (5 mg total) by mouth at bedtime.    Dispense:  90 tablet    Refill:  3    No follow-ups on file.  Sondi Desch, MD

## 2024-01-20 NOTE — ED Triage Notes (Signed)
 Pt presents to the ED via POV from PCP for tachycardia. Pt was at her PCP for regular primary care visit when they found her HR to be 150's. Pt has hx of a-fib/flutter. Pt denies sx's. Pt takes a beta-blocker and has been taking it as prescribed.

## 2024-01-20 NOTE — ED Notes (Signed)
Report received from Candace RN

## 2024-01-20 NOTE — Assessment & Plan Note (Signed)
-   Patient noted to have significant tachycardia on routine vitals and had an EKG which showed 2-1 flutter -She was referred to the ED for further evaluation -Please see my note under A-fib

## 2024-01-20 NOTE — Discharge Instructions (Signed)
 Continue all of your prescribed medications, including metoprolol , Xarelto , etc.  You are being discharged with a prescription for 5 days of doxycycline antibiotic to treat a possible pneumonia we saw on the x-ray of your chest  Reach out to your cardiologist to be seen in the clinic  Return to the ED with any worsening symptoms

## 2024-01-20 NOTE — Assessment & Plan Note (Signed)
-   Patient was incidentally noted to have a heart rate in the 150s on vitals -She denied any chest pain or shortness of breath or headaches or lightheadedness or syncope -EKG showed A-fib/2-1 flutter at a rate of 150 -Patient does follow with cardiology and was in sinus rhythm at her last appointment in February with them -Patient was asked to go to the ED for further evaluation.  They declined an ambulance and daughter states that she will transport the patient to [facility] -Will send a message to the cardiologist as well -No further workup at this time

## 2024-01-21 LAB — CULTURE, BLOOD (ROUTINE X 2)
Culture: NO GROWTH
Culture: NO GROWTH

## 2024-01-22 ENCOUNTER — Ambulatory Visit: Payer: Self-pay | Admitting: Family Medicine

## 2024-01-25 LAB — CULTURE, BLOOD (ROUTINE X 2): Special Requests: ADEQUATE

## 2024-01-28 ENCOUNTER — Telehealth: Payer: Self-pay | Admitting: *Deleted

## 2024-01-28 ENCOUNTER — Ambulatory Visit: Attending: Cardiology | Admitting: Cardiology

## 2024-01-28 ENCOUNTER — Encounter: Payer: Self-pay | Admitting: Cardiology

## 2024-01-28 VITALS — BP 110/68 | HR 154 | Ht 64.0 in | Wt 141.4 lb

## 2024-01-28 DIAGNOSIS — I4891 Unspecified atrial fibrillation: Secondary | ICD-10-CM | POA: Diagnosis not present

## 2024-01-28 MED ORDER — METOPROLOL TARTRATE 25 MG PO TABS: 25.0000 mg | ORAL_TABLET | Freq: Two times a day (BID) | ORAL | 0 refills | Status: DC

## 2024-01-28 MED ORDER — METOPROLOL TARTRATE 25 MG PO TABS
25.0000 mg | ORAL_TABLET | Freq: Two times a day (BID) | ORAL | 3 refills | Status: DC
Start: 2024-01-28 — End: 2024-02-26

## 2024-01-28 NOTE — H&P (View-Only) (Signed)
 Cardiology Office Note:    Date:  01/28/2024   ID:  Kelli Smith, DOB Apr 26, 1941, MRN [id number]  PCP:  Randeen Laine LABOR, MD  Cardiologist:  Redell Cave, MD  Electrophysiologist:  None   Referring MD: No ref. provider found   Chief Complaint  Patient presents with   Follow-up    Following up post ER visit. Patient had a high heart rate.  Patient had a cardioversion. Patient states that she feels fine on today.     History of Present Illness:    Kelli Smith is a 83 y.o. female with a hx of arthritis, paroxysmal A. fib/flutter s/p DCCV 11/07/2020 who presents for follow-up.    States having occasional palpitations, denies dizziness, presyncope or syncope.  Compliant with Xarelto  as prescribed, denies any bleeding issues.  Also takes Toprol -XL as prescribed.  Planning on going out of [place] in about 1 week.  Prior notes TEE 10/2020 EF 50% Echocardiogram 08/2019 normal systolic function, mild LVH, EF 60 to 65%. Cardiac monitor on 09/2019 reviewed again showing paroxysmal atrial fibrillation. Amiodarone  previously stopped due to thyroid  dysfunction.   Past Medical History:  Diagnosis Date   A-fib [facility])    a.) CHA2DS2-VASc = 5 (age x 2, sex, HTN, aortic plaque). b.) s/p DCCV (200J x 2) 11/07/2020. c.) rate/rhythm maintained on oral amiodarone ; chronically anticoagulated using rivaroxaban .   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/2020   B12 deficiency    Basal cell carcinoma    CAD (coronary artery disease)    CAD (coronary artery disease) 11/22/2020   Cyst of right kidney    DDD (degenerative disc disease), lumbar    Hematuria 04/05/2020   History of chicken pox    History of kidney stones    HTN (hypertension)    Knee pain, bilateral 06/11/2018   Long term current use of anticoagulant    a.) rivaroxaban    Memory loss     Microcytosis    Osteopenia    PAC (premature atrial contraction)    Pain of right heel 04/05/2020   Premature atrial contraction 09/20/2019   Primary osteoarthritis of both knees 09/30/2020   Right shoulder pain 06/11/2018   Thyromegaly    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    Past Surgical History:  Procedure Laterality Date   BUNIONECTOMY WITH HAMMERTOE RECONSTRUCTION  2009   CARDIOVERSION N/A 11/07/2020   Procedure: CARDIOVERSION (200J x 2); Location: [facility]; Surgeon: Redell Cave, MD   CATARACT EXTRACTION Bilateral    2013/2014   CESAREAN SECTION N/A 1978   KNEE ARTHROPLASTY Left 12/12/2020   Procedure: COMPUTER ASSISTED TOTAL KNEE ARTHROPLASTY;  Surgeon: Mardee Lynwood SQUIBB, MD;  Location: [facility] ORS;  Service: Orthopedics;  Laterality: Left;   KNEE ARTHROPLASTY Right 10/28/2021   Procedure: COMPUTER ASSISTED TOTAL KNEE ARTHROPLASTY;  Surgeon: Mardee Lynwood SQUIBB, MD;  Location: [facility] ORS;  Service: Orthopedics;  Laterality: Right;   TEE WITHOUT CARDIOVERSION N/A 11/07/2020   Procedure: TRANSESOPHAGEAL ECHOCARDIOGRAM (TEE);  Surgeon: Cave Redell, MD;  Location: [facility] ORS;  Service: Cardiovascular;  Laterality: N/A;    Current Medications: Current Meds  Medication Sig   acetaminophen  (TYLENOL ) 650 MG CR tablet Take 1,300 mg by mouth every 8 (eight) hours as needed for pain.   Ascorbic Acid (VITAMIN C PO) Take by mouth daily.   Calcium  Carbonate-Vit  D-Min (CALCIUM  600+D3 PLUS MINERALS PO) Take 1,200 mg by mouth daily.   donepezil  (ARICEPT ) 5 MG tablet Take 1 tablet (5 mg total) by mouth at bedtime.   furosemide  (LASIX ) 20 MG tablet Take 1 tablet (20 mg total) by mouth daily as needed. For swelling and shortness of breath.   Iron , Ferrous Sulfate , 325 (65 Fe) MG TABS Take 325 mg by mouth every other day.   methimazole (TAPAZOLE) 5 MG tablet Take by mouth. (Patient taking differently: Take 2.5 mg by mouth daily.)   rivaroxaban  (XARELTO ) 20 MG TABS tablet Take 1 tablet (20  mg total) by mouth daily with supper.   [DISCONTINUED] metoprolol  succinate (TOPROL  XL) 25 MG 24 hr tablet Take 1 tablet (25 mg total) by mouth daily.   [DISCONTINUED] metoprolol  tartrate (LOPRESSOR ) 25 MG tablet Take 1 tablet (25 mg total) by mouth 2 (two) times daily.     Allergies:   Patient has no known allergies.   Social History   Socioeconomic History   Marital status: Widowed    Spouse name: Not on file   Number of children: Not on file   Years of education: Not on file   Highest education level: Bachelor's degree (e.g., BA, AB, BS)  Occupational History   Not on file  Tobacco Use   Smoking status: Never   Smokeless tobacco: Never  Vaping Use   Vaping status: Never Used  Substance and Sexual Activity   Alcohol use: Not Currently   Drug use: Never   Sexual activity: Not on file  Other Topics Concern   Not on file  Social History Narrative   From [place] lived in [place]  since late 1990s early 2000    Lives with daughter    Automotive engineer ed    Former Runner, broadcasting/film/video    No guns, wears seat belt, safe in relationship    Widowed       2 daughters 1/2 in [employer]    Social Drivers of Health   Financial Resource Strain: Patient Declined (01/19/2024)   Overall Financial Resource Strain (CARDIA)    Difficulty of Paying Living Expenses: Patient declined  Food Insecurity: No Food Insecurity (01/19/2024)   Hunger Vital Sign    Worried About Running Out of Food in the Last Year: Never true    Ran Out of Food in the Last Year: Never true  Transportation Needs: No Transportation Needs (01/19/2024)   PRAPARE - Administrator, Civil Service (Medical): No    Lack of Transportation (Non-Medical): No  Physical Activity: Inactive (01/19/2024)   Exercise Vital Sign    Days of Exercise per Week: 0 days    Minutes of Exercise per Session: Not on file  Stress: No Stress Concern Present (01/19/2024)   [employer] of Occupational Health - Occupational Stress Questionnaire    Feeling  of Stress: Not at all  Social Connections: Socially Isolated (01/19/2024)   Social Connection and Isolation Panel    Frequency of Communication with Friends and Family: Once a week    Frequency of Social Gatherings with Friends and Family: Once a week    Attends Religious Services: 1 to 4 times per year    Active Member of [employer] or Organizations: No    Attends Banker Meetings: Not on file    Marital Status: Widowed     Family History: The patient's family history includes Alzheimer's disease in her sister; Heart Problems in her mother; Heart disease in her father and mother.  ROS:  Please see the history of present illness.     All other systems reviewed and are negative.  EKGs/Labs/Other Studies Reviewed:    The following studies were reviewed today:   EKG Interpretation Date/Time:  Thursday January 28 2024 08:55:11 EDT Ventricular Rate:  154 PR Interval:    QRS Duration:  70 QT Interval:  248 QTC Calculation: 397 R Axis:   -2  Text Interpretation: Atrial fibrillation with rapid ventricular response Nonspecific T wave abnormality Confirmed by Darliss Rogue ([id number]) on 01/28/2024 9:08:53 AM    Recent Labs: 01/20/2024: ALT 37; BUN 19; Creatinine, Ser 0.83; Hemoglobin 12.0; Magnesium  2.2; Platelets 209; Potassium 4.0; Sodium 138; TSH 2.344  Recent Lipid Panel    Component Value Date/Time   CHOL 159 11/03/2022 0828   TRIG 80.0 11/03/2022 0828   HDL 56.20 11/03/2022 0828   CHOLHDL 3 11/03/2022 0828   VLDL 16.0 11/03/2022 0828   LDLCALC 86 11/03/2022 0828    Physical Exam:    VS:  BP 110/68   Pulse (!) 154   Ht 5' 4 (1.626 m)   Wt 141 lb 6.4 oz (64.1 kg)   SpO2 98%   BMI 24.27 kg/m     Wt Readings from Last 3 Encounters:  01/28/24 141 lb 6.4 oz (64.1 kg)  01/20/24 145 lb 4.5 oz (65.9 kg)  01/20/24 145 lb 3.2 oz (65.9 kg)     GEN:  Well nourished, well developed in no acute distress HEENT: Normal NECK: No JVD; No carotid bruits CARDIAC:  Irregular irregular, tachycardic RESPIRATORY:  Clear to auscultation without rales, wheezing or rhonchi  ABDOMEN: Soft, non-tender, non-distended MUSCULOSKELETAL:  No edema; No deformity  SKIN: Warm and dry NEUROLOGIC:  Alert and oriented x 3 PSYCHIATRIC:  Normal affect   ASSESSMENT:    1. Atrial fibrillation with RVR (HCC)    PLAN:    In order of problems listed above:  Paroxysmal A-fib/flutter s/p DC cardioversion 10/2020..  EKG today showing atrial fibrillation with RVR.  No significant symptoms.  Stop Toprol -XL, start Lopressor  25 mg twice daily.  Has not missed Xarelto  dose over the past several months.  Continue Xarelto  20 mg daily.  Plan DC cardioversion in 4 days.  Referred to EP. Amiodarone  previously stopped due to thyroid  dysfunction.   Follow-up 6-8 weeks  Informed Consent   Shared Decision Making/Informed Consent The risks (stroke, cardiac arrhythmias rarely resulting in the need for a temporary or permanent pacemaker, skin irritation or burns and complications associated with conscious sedation including aspiration, arrhythmia, respiratory failure and death), benefits (restoration of normal sinus rhythm) and alternatives of a direct current cardioversion were explained in detail to Kelli Smith.        Medication Adjustments/Labs and Tests Ordered: Current medicines are reviewed at length with the patient today.  Concerns regarding medicines are outlined above.  Orders Placed This Encounter  Procedures   Ambulatory referral to Cardiac Electrophysiology   EKG 12-Lead    Meds ordered this encounter  Medications   DISCONTD: metoprolol  tartrate (LOPRESSOR ) 25 MG tablet    Sig: Take 1 tablet (25 mg total) by mouth 2 (two) times daily.    Dispense:  180 each    Refill:  0   metoprolol  tartrate (LOPRESSOR ) 25 MG tablet    Sig: Take 1 tablet (25 mg total) by mouth 2 (two) times daily.    Dispense:  60 tablet    Refill:  3     Patient  Instructions  Medication Instructions:  STOP the Metoprolol  (Toprol )  START Metoprolol  Tartrate 25 mg twice daily  *If you need a refill on your cardiac medications before your next appointment, please call your pharmacy*  Lab Work: None ordered  If you have labs (blood work) drawn today and your tests are completely normal, you will receive your results only by: MyChart Message (if you have MyChart) OR A paper copy in the mail If you have any lab test that is abnormal or we need to change your treatment, we will call you to review the results.   Follow-Up: At [facility], you and your health needs are our priority.  As part of our continuing mission to provide you with exceptional heart care, our providers are all part of one team.  This team includes your primary Cardiologist (physician) and Advanced Practice Providers or APPs (Physician Assistants and Nurse Practitioners) who all work together to provide you with the care you need, when you need it.  Your next appointment:   4 week(s)  Provider:   You may see Redell Cave, MD or one of the following Advanced Practice Providers on your designated Care Team:   Lonni Meager, NP Lesley Maffucci, PA-C Bernardino Bring, PA-C Cadence Franchester, PA-C Tylene Lunch, NP Barnie Hila, NP    A referral has been placed to Electrophysiology  We recommend signing up for the patient portal called MyChart.  Sign up information is provided on this After Visit Summary.  MyChart is used to connect with patients for Virtual Visits (Telemedicine).  Patients are able to view lab/test results, encounter notes, upcoming appointments, etc.  Non-urgent messages can be sent to your provider as well.   To learn more about what you can do with MyChart, go to [email or web address].   Other Instructions     Dear Kelli Smith  You are scheduled for a Cardioversion on Monday, July 7 with Dr. Cave.  Please arrive at the [facility] Entrance of [facility], [address], [place] [address] at 7:00 AM (This is 1 hour(s) prior to your procedure time).  Smith to the Check-In Desk directly inside the entrance.  Procedure Parking: Use the entrance off of the [address] side of the hospital. Turn right upon entering and follow the driveway to parking that is directly in front of the [facility]. There is no valet parking available at this entrance, however there is an awning directly in front of the [facility] for drop off/ pick up for patients.    DIET:  Nothing to eat or drink after midnight except a sip of water with medications (see medication instructions below)   Medications: Keep taking your Xarelto . Do not miss a dose. If you do, please call the office immedialtey at [phone]. You will need to continue this after your procedure until you are told by your provider that it is safe to stop.    Hold the Furosemide  the morning of the procedure    LABS: Completed on 01/20/24  FYI:  For your safety, and to allow us  to monitor your vital signs accurately during the surgery/procedure we request: If you have artificial nails, gel coating, SNS etc, please have those removed prior to your surgery/procedure. Not having the nail coverings /polish removed may result in cancellation or delay of your surgery/procedure.  Your support person will be asked to wait in the waiting room during your procedure.  It is OK to have someone drop you  off and come back when you are ready to be discharged.  You cannot drive after the procedure and will need someone to drive you home.  Bring your insurance cards.  *Special Note: Every effort is made to have your procedure done on time. Occasionally there are emergencies that occur at the hospital that may cause delays. Please be patient if a delay does occur.           Signed, Redell Cave, MD  01/28/2024 10:41 AM    [facility]

## 2024-01-28 NOTE — Progress Notes (Signed)
 Cardiology Office Note:    Date:  01/28/2024   ID:  Kelli Smith, DOB Apr 26, 1941, MRN [id number]  PCP:  Randeen Laine LABOR, MD  Cardiologist:  Redell Cave, MD  Electrophysiologist:  None   Referring MD: No ref. provider found   Chief Complaint  Patient presents with   Follow-up    Following up post ER visit. Patient had a high heart rate.  Patient had a cardioversion. Patient states that she feels fine on today.     History of Present Illness:    Kelli Smith is a 83 y.o. female with a hx of arthritis, paroxysmal A. fib/flutter s/p DCCV 11/07/2020 who presents for follow-up.    States having occasional palpitations, denies dizziness, presyncope or syncope.  Compliant with Xarelto  as prescribed, denies any bleeding issues.  Also takes Toprol -XL as prescribed.  Planning on going out of country for a funeral in about 1 week.  Prior notes TEE 10/2020 EF 50% Echocardiogram 08/2019 normal systolic function, mild LVH, EF 60 to 65%. Cardiac monitor on 09/2019 reviewed again showing paroxysmal atrial fibrillation. Amiodarone  previously stopped due to thyroid  dysfunction.   Past Medical History:  Diagnosis Date   A-fib [facility])    a.) CHA2DS2-VASc = 5 (age x 2, sex, HTN, aortic plaque). b.) s/p DCCV (200J x 2) 11/07/2020. c.) rate/rhythm maintained on oral amiodarone ; chronically anticoagulated using rivaroxaban .   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/2020   B12 deficiency    Basal cell carcinoma    CAD (coronary artery disease)    CAD (coronary artery disease) 11/22/2020   Cyst of right kidney    DDD (degenerative disc disease), lumbar    Hematuria 04/05/2020   History of chicken pox    History of kidney stones    HTN (hypertension)    Knee pain, bilateral 06/11/2018   Long term current use of anticoagulant    a.) rivaroxaban    Memory loss     Microcytosis    Osteopenia    PAC (premature atrial contraction)    Pain of right heel 04/05/2020   Premature atrial contraction 09/20/2019   Primary osteoarthritis of both knees 09/30/2020   Right shoulder pain 06/11/2018   Thyromegaly    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    Past Surgical History:  Procedure Laterality Date   BUNIONECTOMY WITH HAMMERTOE RECONSTRUCTION  2009   CARDIOVERSION N/A 11/07/2020   Procedure: CARDIOVERSION (200J x 2); Location: [facility]; Surgeon: Redell Cave, MD   CATARACT EXTRACTION Bilateral    2013/2014   CESAREAN SECTION N/A 1978   KNEE ARTHROPLASTY Left 12/12/2020   Procedure: COMPUTER ASSISTED TOTAL KNEE ARTHROPLASTY;  Surgeon: Mardee Lynwood SQUIBB, MD;  Location: [facility] ORS;  Service: Orthopedics;  Laterality: Left;   KNEE ARTHROPLASTY Right 10/28/2021   Procedure: COMPUTER ASSISTED TOTAL KNEE ARTHROPLASTY;  Surgeon: Mardee Lynwood SQUIBB, MD;  Location: [facility] ORS;  Service: Orthopedics;  Laterality: Right;   TEE WITHOUT CARDIOVERSION N/A 11/07/2020   Procedure: TRANSESOPHAGEAL ECHOCARDIOGRAM (TEE);  Surgeon: Cave Redell, MD;  Location: [facility] ORS;  Service: Cardiovascular;  Laterality: N/A;    Current Medications: Current Meds  Medication Sig   acetaminophen  (TYLENOL ) 650 MG CR tablet Take 1,300 mg by mouth every 8 (eight) hours as needed for pain.   Ascorbic Acid (VITAMIN C PO) Take by mouth daily.   Calcium  Carbonate-Vit  D-Min (CALCIUM  600+D3 PLUS MINERALS PO) Take 1,200 mg by mouth daily.   donepezil  (ARICEPT ) 5 MG tablet Take 1 tablet (5 mg total) by mouth at bedtime.   furosemide  (LASIX ) 20 MG tablet Take 1 tablet (20 mg total) by mouth daily as needed. For swelling and shortness of breath.   Iron , Ferrous Sulfate , 325 (65 Fe) MG TABS Take 325 mg by mouth every other day.   methimazole (TAPAZOLE) 5 MG tablet Take by mouth. (Patient taking differently: Take 2.5 mg by mouth daily.)   rivaroxaban  (XARELTO ) 20 MG TABS tablet Take 1 tablet (20  mg total) by mouth daily with supper.   [DISCONTINUED] metoprolol  succinate (TOPROL  XL) 25 MG 24 hr tablet Take 1 tablet (25 mg total) by mouth daily.   [DISCONTINUED] metoprolol  tartrate (LOPRESSOR ) 25 MG tablet Take 1 tablet (25 mg total) by mouth 2 (two) times daily.     Allergies:   Patient has no known allergies.   Social History   Socioeconomic History   Marital status: Widowed    Spouse name: Not on file   Number of children: Not on file   Years of education: Not on file   Highest education level: Bachelor's degree (e.g., BA, AB, BS)  Occupational History   Not on file  Tobacco Use   Smoking status: Never   Smokeless tobacco: Never  Vaping Use   Vaping status: Never Used  Substance and Sexual Activity   Alcohol use: Not Currently   Drug use: Never   Sexual activity: Not on file  Other Topics Concern   Not on file  Social History Narrative   From [place] lived in [place]  since late 1990s early 2000    Lives with daughter    Automotive engineer ed    Former Runner, broadcasting/film/video    No guns, wears seat belt, safe in relationship    Widowed       2 daughters 1/2 in [employer]    Social Drivers of Health   Financial Resource Strain: Patient Declined (01/19/2024)   Overall Financial Resource Strain (CARDIA)    Difficulty of Paying Living Expenses: Patient declined  Food Insecurity: No Food Insecurity (01/19/2024)   Hunger Vital Sign    Worried About Running Out of Food in the Last Year: Never true    Ran Out of Food in the Last Year: Never true  Transportation Needs: No Transportation Needs (01/19/2024)   PRAPARE - Administrator, Civil Service (Medical): No    Lack of Transportation (Non-Medical): No  Physical Activity: Inactive (01/19/2024)   Exercise Vital Sign    Days of Exercise per Week: 0 days    Minutes of Exercise per Session: Not on file  Stress: No Stress Concern Present (01/19/2024)   [employer] of Occupational Health - Occupational Stress Questionnaire    Feeling  of Stress: Not at all  Social Connections: Socially Isolated (01/19/2024)   Social Connection and Isolation Panel    Frequency of Communication with Friends and Family: Once a week    Frequency of Social Gatherings with Friends and Family: Once a week    Attends Religious Services: 1 to 4 times per year    Active Member of [employer] or Organizations: No    Attends Banker Meetings: Not on file    Marital Status: Widowed     Family History: The patient's family history includes Alzheimer's disease in her sister; Heart Problems in her mother; Heart disease in her father and mother.  ROS:  Please see the history of present illness.     All other systems reviewed and are negative.  EKGs/Labs/Other Studies Reviewed:    The following studies were reviewed today:   EKG Interpretation Date/Time:  Thursday January 28 2024 08:55:11 EDT Ventricular Rate:  154 PR Interval:    QRS Duration:  70 QT Interval:  248 QTC Calculation: 397 R Axis:   -2  Text Interpretation: Atrial fibrillation with rapid ventricular response Nonspecific T wave abnormality Confirmed by Darliss Rogue ([id number]) on 01/28/2024 9:08:53 AM    Recent Labs: 01/20/2024: ALT 37; BUN 19; Creatinine, Ser 0.83; Hemoglobin 12.0; Magnesium  2.2; Platelets 209; Potassium 4.0; Sodium 138; TSH 2.344  Recent Lipid Panel    Component Value Date/Time   CHOL 159 11/03/2022 0828   TRIG 80.0 11/03/2022 0828   HDL 56.20 11/03/2022 0828   CHOLHDL 3 11/03/2022 0828   VLDL 16.0 11/03/2022 0828   LDLCALC 86 11/03/2022 0828    Physical Exam:    VS:  BP 110/68   Pulse (!) 154   Ht 5' 4 (1.626 m)   Wt 141 lb 6.4 oz (64.1 kg)   SpO2 98%   BMI 24.27 kg/m     Wt Readings from Last 3 Encounters:  01/28/24 141 lb 6.4 oz (64.1 kg)  01/20/24 145 lb 4.5 oz (65.9 kg)  01/20/24 145 lb 3.2 oz (65.9 kg)     GEN:  Well nourished, well developed in no acute distress HEENT: Normal NECK: No JVD; No carotid bruits CARDIAC:  Irregular irregular, tachycardic RESPIRATORY:  Clear to auscultation without rales, wheezing or rhonchi  ABDOMEN: Soft, non-tender, non-distended MUSCULOSKELETAL:  No edema; No deformity  SKIN: Warm and dry NEUROLOGIC:  Alert and oriented x 3 PSYCHIATRIC:  Normal affect   ASSESSMENT:    1. Atrial fibrillation with RVR (HCC)    PLAN:    In order of problems listed above:  Paroxysmal A-fib/flutter s/p DC cardioversion 10/2020..  EKG today showing atrial fibrillation with RVR.  No significant symptoms.  Stop Toprol -XL, start Lopressor  25 mg twice daily.  Has not missed Xarelto  dose over the past several months.  Continue Xarelto  20 mg daily.  Plan DC cardioversion in 4 days.  Referred to EP. Amiodarone  previously stopped due to thyroid  dysfunction.   Follow-up 6-8 weeks  Informed Consent   Shared Decision Making/Informed Consent The risks (stroke, cardiac arrhythmias rarely resulting in the need for a temporary or permanent pacemaker, skin irritation or burns and complications associated with conscious sedation including aspiration, arrhythmia, respiratory failure and death), benefits (restoration of normal sinus rhythm) and alternatives of a direct current cardioversion were explained in detail to Kelli Smith and she agrees to proceed.        Medication Adjustments/Labs and Tests Ordered: Current medicines are reviewed at length with the patient today.  Concerns regarding medicines are outlined above.  Orders Placed This Encounter  Procedures   Ambulatory referral to Cardiac Electrophysiology   EKG 12-Lead    Meds ordered this encounter  Medications   DISCONTD: metoprolol  tartrate (LOPRESSOR ) 25 MG tablet    Sig: Take 1 tablet (25 mg total) by mouth 2 (two) times daily.    Dispense:  180 each    Refill:  0   metoprolol  tartrate (LOPRESSOR ) 25 MG tablet    Sig: Take 1 tablet (25 mg total) by mouth 2 (two) times daily.    Dispense:  60 tablet    Refill:  3     Patient  Instructions  Medication Instructions:  STOP the Metoprolol  (Toprol )  START Metoprolol  Tartrate 25 mg twice daily  *If you need a refill on your cardiac medications before your next appointment, please call your pharmacy*  Lab Work: None ordered  If you have labs (blood work) drawn today and your tests are completely normal, you will receive your results only by: MyChart Message (if you have MyChart) OR A paper copy in the mail If you have any lab test that is abnormal or we need to change your treatment, we will call you to review the results.   Follow-Up: At [facility], you and your health needs are our priority.  As part of our continuing mission to provide you with exceptional heart care, our providers are all part of one team.  This team includes your primary Cardiologist (physician) and Advanced Practice Providers or APPs (Physician Assistants and Nurse Practitioners) who all work together to provide you with the care you need, when you need it.  Your next appointment:   4 week(s)  Provider:   You may see Redell Cave, MD or one of the following Advanced Practice Providers on your designated Care Team:   Lonni Meager, NP Lesley Maffucci, PA-C Bernardino Bring, PA-C Cadence Franchester, PA-C Tylene Lunch, NP Barnie Hila, NP    A referral has been placed to Electrophysiology  We recommend signing up for the patient portal called MyChart.  Sign up information is provided on this After Visit Summary.  MyChart is used to connect with patients for Virtual Visits (Telemedicine).  Patients are able to view lab/test results, encounter notes, upcoming appointments, etc.  Non-urgent messages can be sent to your provider as well.   To learn more about what you can do with MyChart, go to [email or web address].   Other Instructions     Dear Kelli Smith  You are scheduled for a Cardioversion on Monday, July 7 with Dr. Cave.  Please arrive at the [facility] Entrance of [facility], [address], [place] [address] at 7:00 AM (This is 1 hour(s) prior to your procedure time).  Proceed to the Check-In Desk directly inside the entrance.  Procedure Parking: Use the entrance off of the [address] side of the hospital. Turn right upon entering and follow the driveway to parking that is directly in front of the [facility] & Vascular Center. There is no valet parking available at this entrance, however there is an awning directly in front of the Heart & Vascular Center for drop off/ pick up for patients.    DIET:  Nothing to eat or drink after midnight except a sip of water with medications (see medication instructions below)   Medications: Keep taking your Xarelto . Do not miss a dose. If you do, please call the office immedialtey at [phone]. You will need to continue this after your procedure until you are told by your provider that it is safe to stop.    Hold the Furosemide  the morning of the procedure    LABS: Completed on 01/20/24  FYI:  For your safety, and to allow us  to monitor your vital signs accurately during the surgery/procedure we request: If you have artificial nails, gel coating, SNS etc, please have those removed prior to your surgery/procedure. Not having the nail coverings /polish removed may result in cancellation or delay of your surgery/procedure.  Your support person will be asked to wait in the waiting room during your procedure.  It is OK to have someone drop you  off and come back when you are ready to be discharged.  You cannot drive after the procedure and will need someone to drive you home.  Bring your insurance cards.  *Special Note: Every effort is made to have your procedure done on time. Occasionally there are emergencies that occur at the hospital that may cause delays. Please be patient if a delay does occur.           Signed, Redell Cave, MD  01/28/2024 10:41 AM    [facility]

## 2024-01-28 NOTE — Patient Instructions (Addendum)
 Medication Instructions:  STOP the Metoprolol  (Toprol )  START Metoprolol  Tartrate 25 mg twice daily  *If you need a refill on your cardiac medications before your next appointment, please call your pharmacy*  Lab Work: None ordered  If you have labs (blood work) drawn today and your tests are completely normal, you will receive your results only by: MyChart Message (if you have MyChart) OR A paper copy in the mail If you have any lab test that is abnormal or we need to change your treatment, we will call you to review the results.   Follow-Up: At [facility], you and your health needs are our priority.  As part of our continuing mission to provide you with exceptional heart care, our providers are all part of one team.  This team includes your primary Cardiologist (physician) and Advanced Practice Providers or APPs (Physician Assistants and Nurse Practitioners) who all work together to provide you with the care you need, when you need it.  Your next appointment:   4 week(s)  Provider:   You may see Redell Cave, MD or one of the following Advanced Practice Providers on your designated Care Team:   Lonni Meager, NP Lesley Maffucci, PA-C Bernardino Bring, PA-C Cadence Franchester, PA-C Tylene Lunch, NP Barnie Hila, NP    A referral has been placed to Electrophysiology  We recommend signing up for the patient portal called MyChart.  Sign up information is provided on this After Visit Summary.  MyChart is used to connect with patients for Virtual Visits (Telemedicine).  Patients are able to view lab/test results, encounter notes, upcoming appointments, etc.  Non-urgent messages can be sent to your provider as well.   To learn more about what you can do with MyChart, go to [email or web address].   Other Instructions     Dear Kelli Smith  You are scheduled for a Cardioversion on Monday, July 7 with Dr. Cave.  Please arrive at the [facility]  Entrance of [facility], [address], [place] [address] at 7:00 AM (This is 1 hour(s) prior to your procedure time).  Proceed to the Check-In Desk directly inside the entrance.  Procedure Parking: Use the entrance off of the [address] side of the hospital. Turn right upon entering and follow the driveway to parking that is directly in front of the Heart & Vascular Center. There is no valet parking available at this entrance, however there is an awning directly in front of the Heart & Vascular Center for drop off/ pick up for patients.    DIET:  Nothing to eat or drink after midnight except a sip of water with medications (see medication instructions below)   Medications: Keep taking your Xarelto . Do not miss a dose. If you do, please call the office immedialtey at [phone]. You will need to continue this after your procedure until you are told by your provider that it is safe to stop.    Hold the Furosemide  the morning of the procedure    LABS: Completed on 01/20/24  FYI:  For your safety, and to allow us  to monitor your vital signs accurately during the surgery/procedure we request: If you have artificial nails, gel coating, SNS etc, please have those removed prior to your surgery/procedure. Not having the nail coverings /polish removed may result in cancellation or delay of your surgery/procedure.  Your support person will be asked to wait in the waiting room during your procedure.  It is OK to have someone drop you  off and come back when you are ready to be discharged.  You cannot drive after the procedure and will need someone to drive you home.  Bring your insurance cards.  *Special Note: Every effort is made to have your procedure done on time. Occasionally there are emergencies that occur at the hospital that may cause delays. Please be patient if a delay does occur.

## 2024-01-28 NOTE — Telephone Encounter (Signed)
 Left voicemail message to call back for review of information on her scheduled procedure on Monday. Added that if she didn't get to call back by 5 that her procedure information date, time, and location was left with instructions to call back if any questions.    Scheduled for: DCCV 02/01/24 08:00 am [facility] Dr. Gollan

## 2024-02-01 ENCOUNTER — Other Ambulatory Visit: Payer: Self-pay

## 2024-02-01 ENCOUNTER — Encounter
Admission: RE | Disposition: A | Discharge status: Home / Self Care | Payer: Self-pay | Source: Home / Self Care | Attending: Cardiology

## 2024-02-01 ENCOUNTER — Ambulatory Visit
Admission: RE | Admit: 2024-02-01 | Discharge: 2024-02-01 | Disposition: A | Discharge status: Home / Self Care | Attending: Cardiology | Admitting: Cardiology

## 2024-02-01 ENCOUNTER — Ambulatory Visit: Admitting: General Practice

## 2024-02-01 DIAGNOSIS — Z7901 Long term (current) use of anticoagulants: Secondary | ICD-10-CM | POA: Insufficient documentation

## 2024-02-01 DIAGNOSIS — I2510 Atherosclerotic heart disease of native coronary artery without angina pectoris: Secondary | ICD-10-CM | POA: Diagnosis not present

## 2024-02-01 DIAGNOSIS — I10 Essential (primary) hypertension: Secondary | ICD-10-CM | POA: Diagnosis not present

## 2024-02-01 DIAGNOSIS — I4819 Other persistent atrial fibrillation: Secondary | ICD-10-CM | POA: Diagnosis not present

## 2024-02-01 DIAGNOSIS — R0602 Shortness of breath: Secondary | ICD-10-CM

## 2024-02-01 DIAGNOSIS — I4891 Unspecified atrial fibrillation: Secondary | ICD-10-CM | POA: Diagnosis present

## 2024-02-01 DIAGNOSIS — Z79899 Other long term (current) drug therapy: Secondary | ICD-10-CM | POA: Insufficient documentation

## 2024-02-01 HISTORY — PX: CARDIOVERSION: SHX1299

## 2024-02-01 SURGERY — CARDIOVERSION
Anesthesia: General

## 2024-02-01 MED ORDER — PROPOFOL 10 MG/ML IV BOLUS
INTRAVENOUS | Status: DC | PRN
  Administered 2024-02-01: 40 mg via INTRAVENOUS

## 2024-02-01 MED ORDER — SODIUM CHLORIDE 0.9 % IV BOLUS
250.0000 mL | Freq: Once | INTRAVENOUS | Status: AC
  Administered 2024-02-01: 250 mL via INTRAVENOUS

## 2024-02-01 MED ORDER — SODIUM CHLORIDE 0.9 % IV SOLN: INTRAVENOUS | Status: DC

## 2024-02-01 NOTE — Transfer of Care (Signed)
 Immediate Anesthesia Transfer of Care Note  Patient: Kelli Smith  Procedure(s) Performed: CARDIOVERSION  Patient Location: Short Stay  Anesthesia Type:General  Level of Consciousness: awake  Airway & Oxygen Therapy: Patient Spontanous Breathing and Patient connected to nasal cannula oxygen  Post-op Assessment: Report given to RN and Post -op Vital signs reviewed and stable  Post vital signs: Reviewed and stable  Last Vitals:  Vitals Value Taken Time  BP 84/56 02/01/24 08:10  Temp    Pulse 94 02/01/24 08:10  Resp 28 02/01/24 08:10  SpO2 100 % 02/01/24 08:10    Last Pain:  Vitals:   02/01/24 0744  TempSrc: Temporal  PainSc: 0-No pain         Complications: No notable events documented.

## 2024-02-01 NOTE — Progress Notes (Signed)
 Dr. Perla aware of BP. PT Trendelenberg. See MAR.

## 2024-02-01 NOTE — Anesthesia Preprocedure Evaluation (Signed)
 Anesthesia Evaluation  Patient identified by MRN, date of birth, ID band Patient confused  General Assessment Comment:  Last xarelto  dose Thursday (4 days prior)  Reviewed: Allergy & Precautions, NPO status , Patient's Chart, lab work & pertinent test results  History of Anesthesia Complications Negative for: history of anesthetic complications  Airway Mallampati: II  TM Distance: >3 FB Neck ROM: Full    Dental  (+) Edentulous Upper, Edentulous Lower, Dental Advidsory Given   Pulmonary neg pulmonary ROS, neg sleep apnea, neg COPD, Patient abstained from smoking.Not current smoker   Pulmonary exam normal breath sounds clear to auscultation       Cardiovascular Exercise Tolerance: Good hypertension, + CAD  (-) Past MI + dysrhythmias Atrial Fibrillation + Valvular Problems/Murmurs MR  Rhythm:Irregular Rate:Normal - Systolic murmurs  Transesophageal echocardiogram performed on 11/07/2020 revealed a low normal left ventricular systolic function with an EF of 50%.  Left atrium was moderately dilated.  There was mild to moderate mitral valve regurgitation, and trivial aortic valve regurgitation.  There was no evidence of atrial level shunting detected by color-flow Doppler.    Neuro/Psych negative neurological ROS  negative psych ROS   GI/Hepatic ,neg GERD  ,,(+)     (-) substance abuse    Endo/Other  neg diabetes    Renal/GU      Musculoskeletal   Abdominal   Peds  Hematology  (+) Blood dyscrasia, anemia   Anesthesia Other Findings Past Medical History: No date: A-fib [facility])     Comment:  a.) CHA2DS2-VASc = 5 (age x 2, sex, HTN, aortic plaque).              b.) s/p DCCV (200J x 2) 11/07/2020. c.) rate/rhythm               maintained on oral amiodarone ; chronically anticoagulated              using rivaroxaban . No date: Adrenal adenoma, left No date: Anemia No date: Aortic atherosclerosis (HCC) No date: Arthritis      Comment:  knees, right shoulder  No date: B12 deficiency No date: Basal cell carcinoma No date: CAD (coronary artery disease) No date: Cyst of right kidney No date: DDD (degenerative disc disease), lumbar No date: History of chicken pox No date: History of kidney stones No date: HTN (hypertension) No date: Long term current use of anticoagulant     Comment:  a.) rivaroxaban  No date: Memory loss No date: Microcytosis No date: Osteopenia No date: PAC (premature atrial contraction) No date: Thyromegaly  Reproductive/Obstetrics                              Anesthesia Physical Anesthesia Plan  ASA: 3  Anesthesia Plan: General   Post-op Pain Management: Minimal or no pain anticipated   Induction: Intravenous  PONV Risk Score and Plan: 3 and Propofol  infusion, TIVA and Ondansetron   Airway Management Planned: Nasal Cannula  Additional Equipment: None  Intra-op Plan:   Post-operative Plan:   Informed Consent: I have reviewed the patients History and Physical, chart, labs and discussed the procedure including the risks, benefits and alternatives for the proposed anesthesia with the patient or authorized representative who has indicated his/her understanding and acceptance.     Dental advisory given  Plan Discussed with: CRNA and Surgeon  Anesthesia Plan Comments: (Discussed risks of anesthesia with patient, including possibility of difficulty with spontaneous ventilation under anesthesia necessitating airway intervention, PONV, and  rare risks such as cardiac or respiratory or neurological events, and allergic reactions. Discussed the role of CRNA in patient's perioperative care. Patient understands.)        Anesthesia Quick Evaluation

## 2024-02-01 NOTE — CV Procedure (Signed)
Cardioversion procedure note For atrial fibrillation, persistent.  Procedure Details:  Consent: Risks of procedure as well as the alternatives and risks of each were explained to the (patient/caregiver).  Consent for procedure obtained.  Time Out: Verified patient identification, verified procedure, site/side was marked, verified correct patient position, special equipment/implants available, medications/allergies/relevent history reviewed, required imaging and test results available.  Performed  Patient placed on cardiac monitor, pulse oximetry, supplemental oxygen as necessary.   Sedation given: propofol IV, Dr. Lorette Ang Pacer pads placed anterior and posterior chest.   Cardioverted 1 time(s).   Cardioverted at  150 J. Synchronized biphasic Converted to NSR   Evaluation: Findings: Post procedure EKG shows: NSR Complications: None Patient did tolerate procedure well.  Time Spent Directly with the Patient:  45 minutes   Dossie Arbour, M.D., Ph.D.

## 2024-02-02 ENCOUNTER — Encounter: Payer: Self-pay | Admitting: Cardiology

## 2024-02-02 NOTE — Anesthesia Postprocedure Evaluation (Signed)
 Anesthesia Post Note  Patient: Kelli Smith  Procedure(s) Performed: CARDIOVERSION  Patient location during evaluation: Specials Recovery Anesthesia Type: General Level of consciousness: awake and alert Pain management: pain level controlled Vital Signs Assessment: post-procedure vital signs reviewed and stable Respiratory status: spontaneous breathing, nonlabored ventilation, respiratory function stable and patient connected to nasal cannula oxygen Cardiovascular status: blood pressure returned to baseline and stable Postop Assessment: no apparent nausea or vomiting Anesthetic complications: no   No notable events documented.   Last Vitals:  Vitals:   02/01/24 0944 02/01/24 0949  BP: 108/66 104/65  Pulse:    Resp: 19 (!) 22  Temp:    SpO2:      Last Pain:  Vitals:   02/01/24 0744  TempSrc: Temporal  PainSc: 0-No pain                 Debby Mines

## 2024-02-07 NOTE — H&P (Signed)

## 2024-02-07 NOTE — Interval H&P Note (Signed)
 History and Physical Interval Note:  02/07/2024 12:29 PM  Kelli Smith  has presented today for surgery, with the diagnosis of Cardioversion   Afib OK 2nd case [facility] Anest[facility]   Start time 8a.  The various methods of treatment have been discussed with the patient and family. After consideration of risks, benefits and other options for treatment, the patient has consented to  Procedure(s): CARDIOVERSION (N/A) as a surgical intervention.  The patient's history has been reviewed, patient examined, no change in status, stable for surgery.  I have reviewed the patient's chart and labs.  Questions were answered to the patient's satisfaction.     Locke Barrell

## 2024-02-17 DIAGNOSIS — L570 Actinic keratosis: Secondary | ICD-10-CM | POA: Diagnosis not present

## 2024-02-17 DIAGNOSIS — D2272 Melanocytic nevi of left lower limb, including hip: Secondary | ICD-10-CM | POA: Diagnosis not present

## 2024-02-17 DIAGNOSIS — L821 Other seborrheic keratosis: Secondary | ICD-10-CM | POA: Diagnosis not present

## 2024-02-17 DIAGNOSIS — Z85828 Personal history of other malignant neoplasm of skin: Secondary | ICD-10-CM | POA: Diagnosis not present

## 2024-02-17 DIAGNOSIS — D2261 Melanocytic nevi of right upper limb, including shoulder: Secondary | ICD-10-CM | POA: Diagnosis not present

## 2024-02-17 DIAGNOSIS — D2262 Melanocytic nevi of left upper limb, including shoulder: Secondary | ICD-10-CM | POA: Diagnosis not present

## 2024-02-17 DIAGNOSIS — D225 Melanocytic nevi of trunk: Secondary | ICD-10-CM | POA: Diagnosis not present

## 2024-02-17 DIAGNOSIS — Z08 Encounter for follow-up examination after completed treatment for malignant neoplasm: Secondary | ICD-10-CM | POA: Diagnosis not present

## 2024-02-17 DIAGNOSIS — D2271 Melanocytic nevi of right lower limb, including hip: Secondary | ICD-10-CM | POA: Diagnosis not present

## 2024-02-23 ENCOUNTER — Telehealth: Payer: Self-pay | Admitting: *Deleted

## 2024-02-23 NOTE — Telephone Encounter (Signed)
Needs TOC

## 2024-02-26 ENCOUNTER — Other Ambulatory Visit: Payer: Self-pay | Admitting: Cardiology

## 2024-02-26 ENCOUNTER — Ambulatory Visit: Attending: Cardiology | Admitting: Cardiology

## 2024-02-26 ENCOUNTER — Encounter: Payer: Self-pay | Admitting: Cardiology

## 2024-02-26 VITALS — BP 97/68 | HR 78 | Ht 63.0 in | Wt 128.0 lb

## 2024-02-26 DIAGNOSIS — I4819 Other persistent atrial fibrillation: Secondary | ICD-10-CM

## 2024-02-26 DIAGNOSIS — I4891 Unspecified atrial fibrillation: Secondary | ICD-10-CM | POA: Diagnosis not present

## 2024-02-26 MED ORDER — DILTIAZEM HCL 30 MG PO TABS: 30.0000 mg | ORAL_TABLET | Freq: Three times a day (TID) | ORAL | 3 refills | Status: DC

## 2024-02-26 MED ORDER — DIGOXIN 125 MCG PO TABS: 0.1250 mg | ORAL_TABLET | Freq: Every day | ORAL | 3 refills | Status: DC

## 2024-02-26 NOTE — Patient Instructions (Signed)
 Medication Instructions:  - STOP metoprolol  (lopressor ) - START cardizem 30 mg three times a day - START digovin 0.125 mg daily   *If you need a refill on your cardiac medications before your next appointment, please call your pharmacy*  Lab Work: Your provider would like for you to return on 8/14 to have the following labs drawn: digoxin.   Please go to [facility] [address] ([facility]) [address], [place] [address] You do not need an appointment.  They are open from 8 am- 4:30 pm.  Lunch from 1:00 pm- 2:00 pm You do not need to be fasting.  If you have labs (blood work) drawn today and your tests are completely normal, you will receive your results only by: MyChart Message (if you have MyChart) OR A paper copy in the mail If you have any lab test that is abnormal or we need to change your treatment, we will call you to review the results.  Testing/Procedures: No test ordered today   Follow-Up: At [facility], you and your health needs are our priority.  As part of our continuing mission to provide you with exceptional heart care, our providers are all part of one team.  This team includes your primary Cardiologist (physician) and Advanced Practice Providers or APPs (Physician Assistants and Nurse Practitioners) who all work together to provide you with the care you need, when you need it.  Your next appointment:   3 month(s)  Provider:   You may see Redell Cave, MD or one of the following Advanced Practice Providers on your designated Care Team:   Lonni Meager, NP Lesley Maffucci, PA-C Bernardino Bring, PA-C Cadence Bowie, PA-C Tylene Lunch, NP Barnie Hila, NP    We recommend signing up for the patient portal called MyChart.  Sign up information is provided on this After Visit Summary.  MyChart is used to connect with patients for Virtual Visits (Telemedicine).  Patients are able to view lab/test results, encounter notes,  upcoming appointments, etc.  Non-urgent messages can be sent to your provider as well.   To learn more about what you can do with MyChart, go to [email or web address].

## 2024-02-26 NOTE — Progress Notes (Signed)
 Cardiology Office Note:    Date:  02/26/2024   ID:  Kelli Smith, DOB 05-04-1941, MRN [id number]  PCP:  No primary care provider on file.  Cardiologist:  Redell Cave, MD  Electrophysiologist:  None   Referring MD: Randeen Laine LABOR, MD   Chief Complaint  Patient presents with   Follow-up    4 week a fib follow up pat has been doing well with no complaints of chest pain, chest pressure or SOB, medciation reviewed verbally with patient    History of Present Illness:    Kelli Smith is a 83 y.o. female with a hx of arthritis, paroxysmal A. fib/flutter s/p DCCV x 2 on 10/2020 , 01/2024 who presents for follow-up.    Last seen for A-fib RVR, cardioversion was performed successfully on 01/2024.  Patient started on Lopressor  25 mg twice daily.  Endorsed feeling lethargic, fatigued.  Has appointment with EP in about 3 weeks.  Denies palpitations.  Denies dizziness or syncope.  Compliant with Xarelto  as prescribed, no bleeding issues.  Amiodarone  previously stopped due to thyroid  dysfunction.   Prior notes TEE 10/2020 EF 50% Echocardiogram 08/2019 normal systolic function, mild LVH, EF 60 to 65%. Cardiac monitor on 09/2019 reviewed again showing paroxysmal atrial fibrillation. Amiodarone  previously stopped due to thyroid  dysfunction.   Past Medical History:  Diagnosis Date   A-fib [facility])    a.) CHA2DS2-VASc = 5 (age x 2, sex, HTN, aortic plaque). b.) s/p DCCV (200J x 2) 11/07/2020. c.) rate/rhythm maintained on oral amiodarone ; chronically anticoagulated using rivaroxaban .   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/2020   B12 deficiency    Basal cell carcinoma    CAD (coronary artery disease)    CAD (coronary artery disease) 11/22/2020   Cyst of right kidney    DDD (degenerative disc disease), lumbar    Hematuria 04/05/2020   History of chicken pox     History of kidney stones    HTN (hypertension)    Knee pain, bilateral 06/11/2018   Long term current use of anticoagulant    a.) rivaroxaban    Memory loss    Microcytosis    Osteopenia    PAC (premature atrial contraction)    Pain of right heel 04/05/2020   Premature atrial contraction 09/20/2019   Primary osteoarthritis of both knees 09/30/2020   Right shoulder pain 06/11/2018   Thyromegaly    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    Past Surgical History:  Procedure Laterality Date   BUNIONECTOMY WITH HAMMERTOE RECONSTRUCTION  2009   CARDIOVERSION N/A 11/07/2020   Procedure: CARDIOVERSION (200J x 2); Location: [facility]; Surgeon: Redell Cave, MD   CARDIOVERSION N/A 02/01/2024   Procedure: CARDIOVERSION;  Surgeon: Cave Redell, MD;  Location: [facility] ORS;  Service: Cardiovascular;  Laterality: N/A;   CATARACT EXTRACTION Bilateral    2013/2014   CESAREAN SECTION N/A 1978   KNEE ARTHROPLASTY Left 12/12/2020   Procedure: COMPUTER ASSISTED TOTAL KNEE ARTHROPLASTY;  Surgeon: Mardee Lynwood SQUIBB, MD;  Location: [facility] ORS;  Service: Orthopedics;  Laterality: Left;   KNEE ARTHROPLASTY Right 10/28/2021   Procedure: COMPUTER ASSISTED TOTAL KNEE ARTHROPLASTY;  Surgeon: Mardee Lynwood SQUIBB, MD;  Location: [facility] ORS;  Service: Orthopedics;  Laterality: Right;   TEE WITHOUT CARDIOVERSION N/A 11/07/2020   Procedure: TRANSESOPHAGEAL ECHOCARDIOGRAM (TEE);  Surgeon: Cave Redell, MD;  Location: [facility]  ORS;  Service: Cardiovascular;  Laterality: N/A;    Current Medications: Current Meds  Medication Sig   acetaminophen  (TYLENOL ) 650 MG CR tablet Take 1,300 mg by mouth every 8 (eight) hours as needed for pain.   Ascorbic Acid (VITAMIN C PO) Take 500 mg by mouth daily.   Calcium  Carbonate-Vit D-Min (CALCIUM  600+D3 PLUS MINERALS PO) Take 600 mg by mouth 2 (two) times daily.   digoxin (LANOXIN) 0.125 MG tablet Take 1 tablet (0.125 mg total) by mouth daily.   diltiazem (CARDIZEM) 30 MG tablet Take  1 tablet (30 mg total) by mouth 3 (three) times daily.   donepezil  (ARICEPT ) 5 MG tablet Take 1 tablet (5 mg total) by mouth at bedtime.   furosemide  (LASIX ) 20 MG tablet Take 1 tablet (20 mg total) by mouth daily as needed. For swelling and shortness of breath.   Iron , Ferrous Sulfate , 325 (65 Fe) MG TABS Take 325 mg by mouth every other day.   methimazole (TAPAZOLE) 5 MG tablet Take by mouth. (Patient taking differently: Take 2.5 mg by mouth daily.)   Multiple Vitamins-Minerals (MULTIVITAMIN WITH MINERALS) tablet Take 1 tablet by mouth every other day.   [DISCONTINUED] metoprolol  tartrate (LOPRESSOR ) 25 MG tablet Take 1 tablet (25 mg total) by mouth 2 (two) times daily.   [DISCONTINUED] rivaroxaban  (XARELTO ) 20 MG TABS tablet Take 1 tablet (20 mg total) by mouth daily with supper.     Allergies:   Patient has no known allergies.   Social History   Socioeconomic History   Marital status: Widowed    Spouse name: Not on file   Number of children: Not on file   Years of education: Not on file   Highest education level: Bachelor's degree (e.g., BA, AB, BS)  Occupational History   Not on file  Tobacco Use   Smoking status: Never   Smokeless tobacco: Never  Vaping Use   Vaping status: Never Used  Substance and Sexual Activity   Alcohol use: Not Currently   Drug use: Never   Sexual activity: Not on file  Other Topics Concern   Not on file  Social History Narrative   From [place] lived in [place]  since late 1990s early 2000    Lives with daughter    Automotive engineer ed    Former Runner, broadcasting/film/video    No guns, wears seat belt, safe in relationship    Widowed       2 daughters 1/2 in [employer]    Social Drivers of Health   Financial Resource Strain: Patient Declined (01/19/2024)   Overall Financial Resource Strain (CARDIA)    Difficulty of Paying Living Expenses: Patient declined  Food Insecurity: No Food Insecurity (01/19/2024)   Hunger Vital Sign    Worried About Running Out of Food in the Last  Year: Never true    Ran Out of Food in the Last Year: Never true  Transportation Needs: No Transportation Needs (01/19/2024)   PRAPARE - Administrator, Civil Service (Medical): No    Lack of Transportation (Non-Medical): No  Physical Activity: Inactive (01/19/2024)   Exercise Vital Sign    Days of Exercise per Week: 0 days    Minutes of Exercise per Session: Not on file  Stress: No Stress Concern Present (01/19/2024)   [employer] of Occupational Health - Occupational Stress Questionnaire    Feeling of Stress: Not at all  Social Connections: Socially Isolated (01/19/2024)   Social Connection and Isolation Panel    Frequency of Communication with  Friends and Family: Once a week    Frequency of Social Gatherings with Friends and Family: Once a week    Attends Religious Services: 1 to 4 times per year    Active Member of [employer] or Organizations: No    Attends Banker Meetings: Not on file    Marital Status: Widowed     Family History: The patient's family history includes Alzheimer's disease in her sister; Heart Problems in her mother; Heart disease in her father and mother.  ROS:   Please see the history of present illness.     All other systems reviewed and are negative.  EKGs/Labs/Other Studies Reviewed:    The following studies were reviewed today:   EKG Interpretation Date/Time:  Friday February 26 2024 10:29:29 EDT Ventricular Rate:  153 PR Interval:    QRS Duration:  78 QT Interval:  290 QTC Calculation: 463 R Axis:   25  Text Interpretation: Atrial fibrillation with rapid ventricular response Nonspecific T wave abnormality Confirmed by Darliss Rogue ([id number]) on 02/26/2024 10:39:03 AM    Recent Labs: 01/20/2024: ALT 37; BUN 19; Creatinine, Ser 0.83; Hemoglobin 12.0; Magnesium  2.2; Platelets 209; Potassium 4.0; Sodium 138; TSH 2.344  Recent Lipid Panel    Component Value Date/Time   CHOL 159 11/03/2022 0828   TRIG 80.0 11/03/2022 0828    HDL 56.20 11/03/2022 0828   CHOLHDL 3 11/03/2022 0828   VLDL 16.0 11/03/2022 0828   LDLCALC 86 11/03/2022 0828    Physical Exam:    VS:  BP 97/68 (BP Location: Left Arm, Patient Position: Sitting, Cuff Size: Normal)   Pulse 78   Ht 5' 3 (1.6 m)   Wt 128 lb (58.1 kg)   SpO2 94%   BMI 22.67 kg/m     Wt Readings from Last 3 Encounters:  02/26/24 128 lb (58.1 kg)  02/01/24 140 lb 11.2 oz (63.8 kg)  01/28/24 141 lb 6.4 oz (64.1 kg)     GEN:  Well nourished, well developed in no acute distress HEENT: Normal NECK: No JVD; No carotid bruits CARDIAC: Irregular irregular, tachycardic RESPIRATORY:  Clear to auscultation without rales, wheezing or rhonchi  ABDOMEN: Soft, non-tender, non-distended MUSCULOSKELETAL:  No edema; No deformity  SKIN: Warm and dry NEUROLOGIC:  Alert and oriented x 3 PSYCHIATRIC:  Normal affect   ASSESSMENT:    1. Atrial fibrillation with RVR (HCC)    PLAN:    In order of problems listed above:  Paroxysmal A-fib/flutter s/p DC cardioversion x 2 in 10/2020, 01/2024.  EKG today showing atrial fibrillation with RVR.  Patient with symptoms of fatigue.  Showing this is secondary to A-fib versus beta-blocker.  Stop Lopressor .  Start Cardizem 30 mg 3 times daily, start digoxin 0.125 mg daily.  Check sig level in 2 weeks.  Keep appointment with EP as scheduled.  History of thyroid  dysfunction with amiodarone .  Obtain echo as scheduled. .   Follow-up 2 to 3 months.       Medication Adjustments/Labs and Tests Ordered: Current medicines are reviewed at length with the patient today.  Concerns regarding medicines are outlined above.  Orders Placed This Encounter  Procedures   Digoxin level   EKG 12-Lead    Meds ordered this encounter  Medications   diltiazem (CARDIZEM) 30 MG tablet    Sig: Take 1 tablet (30 mg total) by mouth 3 (three) times daily.    Dispense:  180 tablet    Refill:  3   digoxin (LANOXIN) 0.125 MG  tablet    Sig: Take 1 tablet (0.125  mg total) by mouth daily.    Dispense:  90 tablet    Refill:  3     Patient Instructions  Medication Instructions:  - STOP metoprolol  (lopressor ) - START cardizem 30 mg three times a day - START digovin 0.125 mg daily   *If you need a refill on your cardiac medications before your next appointment, please call your pharmacy*  Lab Work: Your provider would like for you to return on 8/14 to have the following labs drawn: digoxin.   Please go to [facility] [address] ([facility]) [address], [place] [address] You do not need an appointment.  They are open from 8 am- 4:30 pm.  Lunch from 1:00 pm- 2:00 pm You do not need to be fasting.  If you have labs (blood work) drawn today and your tests are completely normal, you will receive your results only by: MyChart Message (if you have MyChart) OR A paper copy in the mail If you have any lab test that is abnormal or we need to change your treatment, we will call you to review the results.  Testing/Procedures: No test ordered today   Follow-Up: At [facility], you and your health needs are our priority.  As part of our continuing mission to provide you with exceptional heart care, our providers are all part of one team.  This team includes your primary Cardiologist (physician) and Advanced Practice Providers or APPs (Physician Assistants and Nurse Practitioners) who all work together to provide you with the care you need, when you need it.  Your next appointment:   3 month(s)  Provider:   You may see Redell Cave, MD or one of the following Advanced Practice Providers on your designated Care Team:   Lonni Meager, NP Lesley Maffucci, PA-C Bernardino Bring, PA-C Cadence Seabrook, PA-C Tylene Lunch, NP Barnie Hila, NP    We recommend signing up for the patient portal called MyChart.  Sign up information is provided on this After Visit Summary.  MyChart is used to connect with patients for  Virtual Visits (Telemedicine).  Patients are able to view lab/test results, encounter notes, upcoming appointments, etc.  Non-urgent messages can be sent to your provider as well.   To learn more about what you can do with MyChart, go to [email or web address].          Signed, Redell Cave, MD  02/26/2024 11:58 AM    [facility]

## 2024-02-26 NOTE — Telephone Encounter (Signed)
 Xarelto  20mg  refill request received. Pt is 83 years old, weight-58.1kg, Crea-0.83 on 01/20/24, last seen by Dr. Budd Kindle on 02/26/24, Diagnosis-Afib, CrCl-55.42 mL/min; Dose is appropriate based on dosing criteria. Will send in refill to requested pharmacy.

## 2024-03-02 ENCOUNTER — Other Ambulatory Visit: Payer: Medicare Other

## 2024-03-10 ENCOUNTER — Ambulatory Visit: Attending: Cardiology

## 2024-03-10 ENCOUNTER — Other Ambulatory Visit: Payer: Self-pay | Admitting: *Deleted

## 2024-03-10 DIAGNOSIS — I4892 Unspecified atrial flutter: Secondary | ICD-10-CM | POA: Diagnosis not present

## 2024-03-10 DIAGNOSIS — I4891 Unspecified atrial fibrillation: Secondary | ICD-10-CM

## 2024-03-10 LAB — ECHOCARDIOGRAM COMPLETE
AR max vel: 0.98 cm2
AV Area VTI: 0.93 cm2
AV Area mean vel: 0.98 cm2
AV Mean grad: 10 mmHg
AV Peak grad: 20.1 mmHg
Ao pk vel: 2.24 m/s
S' Lateral: 3.04 cm

## 2024-03-11 ENCOUNTER — Ambulatory Visit: Payer: Self-pay | Admitting: Cardiology

## 2024-03-11 LAB — DIGOXIN LEVEL: Digoxin, Serum: 1.7 ng/mL — ABNORMAL HIGH (ref 0.5–0.9)

## 2024-03-15 NOTE — Progress Notes (Unsigned)
 Electrophysiology Office Note:    Date:  03/16/2024   ID:  Kelli Smith, DOB [birth year], MRN [id number]  [facility] Cardiologist:  Redell Cave, MD  [facility] Electrophysiologist:  OLE ONEIDA HOLTS, MD   Referring MD: Cave Redell, MD   Chief Complaint: Atrial fibrillation  History of Present Illness:    Kelli Smith is an 83 year old woman who I am seeing today for an evaluation of atrial fibrillation at the request of Dr. Cave.  The patient has a history of atrial fibrillation with multiple prior cardioversions.  The patient's most recent cardioversion was in July of this year.  She reported feeling tired with her atrial fibrillation.  She takes Xarelto  for stroke prophylaxis.  She was on amiodarone  in the past but this had to be stopped because of thyroid  dysfunction.  At the most recent appointment with Dr. Cave on August 1, digoxin  was started.  She is with her daughter today in clinic.  Her daughter tells me that her mom has had significant fatigue.  Also experiencing some weight loss.  Decreased appetite.    Their past medical, social and family history was reviewed.   ROS:   Please see the history of present illness.    All other systems reviewed and are negative.  EKGs/Labs/Other Studies Reviewed:    The following studies were reviewed today:\  March 10, 2024 echo EF 55-60 RV normal Moderately dilated left atrium Mild MR Mild to moderate TR Moderate AI  February 26, 2024 EKG shows atrial fibrillation with a ventricular rate of 153 bpm  February 01, 2024 EKG shows sinus rhythm.  Short PR.  January 28, 2024 EKG shows atrial fibrillation with a ventricular rate of 154 bpm EKG Interpretation Date/Time:  Wednesday March 16 2024 15:20:28 EDT Ventricular Rate:  118 PR Interval:    QRS Duration:  86 QT Interval:  318 QTC Calculation: 445 R Axis:   86  Text Interpretation: Atrial fibrillation with rapid ventricular response Confirmed by  HOLTS OLE [phone]) on 03/16/2024 3:34:47 PM    Physical Exam:    VS:  BP 127/72   Pulse (!) 118   Ht 5' 3 (1.6 m)   Wt 121 lb 3.2 oz (55 kg)   SpO2 91%   BMI 21.47 kg/m     Wt Readings from Last 3 Encounters:  03/16/24 121 lb 3.2 oz (55 kg)  02/26/24 128 lb (58.1 kg)  02/01/24 140 lb 11.2 oz (63.8 kg)     GEN: no distress CARD: RRR, No MRG RESP: No IWOB. CTAB.        ASSESSMENT AND PLAN:    1. Persistent atrial fibrillation (HCC)   2. Encounter for long-term (current) use of high-risk medication     #Atrial fibrillation #Atrial flutter #High risk drug monitoring-digoxin  The patient has symptomatic atrial arrhythmias.  I discussed treatment options including antiarrhythmic drugs and catheter ablation.  I do not think she is an appropriate candidate for catheter ablation at this time.  I am wondering whether or not she is experiencing some side effects from her digoxin .  We will stop the digoxin  today and the diltiazem .  We will start her on metoprolol  succinate 25 mg by mouth twice daily.  I also discussed starting Tikosyn.  I discussed the risks and need for inpatient hospitalization and she wishes to proceed.  We will get this arranged for her.  I have also encouraged her to follow-up with her primary care physician regarding her weight loss to make sure there  is no additional workup needed.  Continue Xarelto .     Signed, Ole T. Cindie, MD, [facility], [facility] 03/16/2024 3:51 PM    Electrophysiology [facility]

## 2024-03-16 ENCOUNTER — Ambulatory Visit: Attending: Cardiology | Admitting: Cardiology

## 2024-03-16 ENCOUNTER — Ambulatory Visit: Payer: Self-pay

## 2024-03-16 ENCOUNTER — Encounter: Payer: Self-pay | Admitting: Cardiology

## 2024-03-16 VITALS — BP 127/72 | HR 118 | Ht 63.0 in | Wt 121.2 lb

## 2024-03-16 DIAGNOSIS — Z79899 Other long term (current) drug therapy: Secondary | ICD-10-CM | POA: Diagnosis not present

## 2024-03-16 DIAGNOSIS — I4819 Other persistent atrial fibrillation: Secondary | ICD-10-CM | POA: Insufficient documentation

## 2024-03-16 MED ORDER — METOPROLOL SUCCINATE ER 25 MG PO TB24: 25.0000 mg | ORAL_TABLET | Freq: Two times a day (BID) | ORAL | 3 refills | Status: DC

## 2024-03-16 NOTE — Telephone Encounter (Signed)
 FYI Only or Action Required?: FYI only for provider.  Patient was last seen in primary care on 01/20/2024 by Onesimo Claude, MD.  Called Nurse Triage reporting poor appetite.  Symptoms began several months ago.  Interventions attempted: Rest, hydration, or home remedies and Dietary changes.  Symptoms are: gradually worsening.  Triage Disposition: See PCP Within 2 Weeks  Patient/caregiver understands and will follow disposition?: Yes   FYI- Per daughter patient has been going through close monitoring and medications with cardiology to control Afib. Pt has undergone 2 cardioversions but they do not maintain.  Cardiology wants to try Tikosyn but it has to be done inpatient. Daughter wants to speak with PCP to make sure there's no underlying cause of patient's weight loss. Per daughter patient had similar episode of weight loss 2 years ago and it was largely due to her thyroid  and being in afib at that time.  Daughter feels that to be the case this time.  Was previously seen by Dr. Hope who is no longer at practice, now assigned to Dr. Marylynn. No appt avail within suggested time, went ahead an scheduled with Dr. Abbey. Added to wait list.   Copied from CRM #[id number]. Topic: Clinical - Red Word Triage >> Mar 16, 2024  4:08 PM Roselie BROCKS wrote: Red Word that prompted transfer to Nurse Triage: Patients daughter on phone stating her  mom is in A Fib, not eating losing weight Reason for Disposition  [1] Continued weight loss AND [2] after medical evaluation by doctor (or NP/PA)  Answer Assessment - Initial Assessment Questions 1. MAIN CONCERN: What is your main concern today?     Poor appetite and weight loss  2. WEIGHT LOSS: How much weight have you lost?  (e.g., lbs., kgs.)  Over what period of time have you lost this weight?  (e.g., number of days, weeks, months, years)     20lbs over the last 8 weeks     141lb (7/3) to 121 (8/20)  3. BASELINE WEIGHT: What is your baseline or normal  weight? (e.g., How much do you usually weigh?)     Normal weight is around 145  4. CAUSE: What do you think is causing the weight loss? (e.g., depression, anxiety, medicine side effect, pain, trouble swallowing, substance or alcohol use problem, eating disorder)    Unsure if its related to medication changes by cardiology and/or thyroid  or being in afib  5. PRIOR EVALUATION: Have you been evaluated by a doctor for your weight loss? If Yes, ask When was your last visit? What did your doctor (or NP/PA) tell you about the possible cause?     Per daughter this has happened before when she was in afib before  6. HEART FAILURE TREATMENT: Do you have heart failure? If Yes, ask: Have you taken new or extra water pills (diuretics) recently? (e.g., furosemide ; bumetanide). What is your target weight?     Take furosemide  PRN  7. OTHER SYMPTOMS: Do you have any other symptoms? (e.g., anxiety or depression, blood in stool, breathing difficulty, diarrhea, fever, trouble swallowing)     More tired, has been in Atrial fibrillation, meds have been cal  8. PREGNANCY: Is there any chance you are pregnant? When was your last menstrual period?     no  Protocols used: Weight Loss - Unintended-A-AH

## 2024-03-16 NOTE — Patient Instructions (Signed)
 Medication Instructions:  Your physician has recommended you make the following change in your medication:  1) STOP taking diltiazem  (Cardizem ) 2) STOP taking Digoxin   3) START taking Toprol  XL (metoprolol  succinate) 25 mg twice daily   *If you need a refill on your cardiac medications before your next appointment, please call your pharmacy*  Follow-Up: At [facility], you and your health needs are our priority.  As part of our continuing mission to provide you with exceptional heart care, our providers are all part of one team.  This team includes your primary Cardiologist (physician) and Advanced Practice Providers or APPs (Physician Assistants and Nurse Practitioners) who all work together to provide you with the care you need, when you need it.   Tikosyn (Dofetilide) Hospital Admission   Prior to day of admission:  Check with drug insurance company for cost of drug to ensure affordability --- Dofetilide 500 mcg twice a day.  GoodRx is an option if insurance copay is unaffordable.    No Benadryl  is allowed 3 days prior to admission.   Please ensure no missed doses of your anticoagulation (blood thinner) for 3 weeks prior to admission. If a dose is missed please notify our office immediately.   A pharmacist will review all your medications for potential interactions with Tikosyn. If any medication changes are needed prior to admission we will be in touch with you.   If any new medications are started AFTER your admission date is set with Radio producer. Please notify our office immediately so your medication list can be updated and reviewed by our pharmacist again.  On day of admission:  Tikosyn initiation requires a 3 night/4 day hospital stay with constant telemetry monitoring. You will have an EKG after each dose of Tikosyn as well as daily lab draws.   If the drug does not convert you to normal rhythm a cardioversion after the 4th dose of Tikosyn.   [facility] office visit on  the morning of admission is needed for preliminary labs/ekg.   Time of admission is dependent on bed availability in the hospital. In some instances, you will be sent home until bed is available. Rarely admission can be delayed to the following day if hospital census prevents available beds.   You may bring personal belongings/clothing with you to the hospital. Please leave your suitcase in the car until you arrive in admissions.   Questions please call our office at [phone]

## 2024-03-16 NOTE — Telephone Encounter (Signed)
 Pt was scheduled with Dr. Abbey on 03/23/2024. Pt was also placed on the waiting list for a sooner appt. Pt has not had her TOC with new provider yet.

## 2024-03-21 ENCOUNTER — Telehealth: Payer: Self-pay | Admitting: Pharmacist

## 2024-03-21 NOTE — Telephone Encounter (Signed)
 Medication list reviewed in anticipation of upcoming Tikosyn initiation. Patient is taking one contraindicated or QTc prolonging medications.   Concurrent use of DONEPEZIL  and QT PROLONGING AGENTS may result in increased risk of QT-interval prolongation and torsade de pointes. Recommend she d/c donepezil  if she will remain on Tikosyn. Recommend she schedule appt with PCP to discuss.  Patient is anticoagulated on Xarelto . Weight has been fluctuating and patient is in between 20mg  and 15mg  dosages. Will continue Xarelto  20mg  once daily at this time.  Please ensure that patient has not missed any anticoagulation doses in the 3 weeks prior to Tikosyn initiation.   Patient will need to be counseled to avoid use of Benadryl  while on Tikosyn and in the 2-3 days prior to Tikosyn initiation.

## 2024-03-21 NOTE — Telephone Encounter (Signed)
-----   Message from Nurse Glade BROCKS sent at 03/16/2024  4:00 PM EDT ----- Regarding: tikosyn Pt for tikosyn please review meds thanks stacy ----- Message ----- From: Chauvigne, Carlyle, RN Sent: 03/16/2024   3:58 PM EDT To: Nancy JONELLE Rhine, RN; Glade GORMAN Pepper, RN  Tikosyn admission per Dr. Cindie.  Thanks!! [employer]

## 2024-03-23 ENCOUNTER — Encounter: Payer: Self-pay | Admitting: Cardiology

## 2024-03-23 ENCOUNTER — Telehealth: Payer: Self-pay

## 2024-03-23 ENCOUNTER — Ambulatory Visit (INDEPENDENT_AMBULATORY_CARE_PROVIDER_SITE_OTHER)

## 2024-03-23 VITALS — BP 97/66 | HR 67 | Ht 63.0 in | Wt 116.8 lb

## 2024-03-23 DIAGNOSIS — I4811 Longstanding persistent atrial fibrillation: Secondary | ICD-10-CM | POA: Insufficient documentation

## 2024-03-23 DIAGNOSIS — R627 Adult failure to thrive: Secondary | ICD-10-CM | POA: Insufficient documentation

## 2024-03-23 DIAGNOSIS — I4821 Permanent atrial fibrillation: Secondary | ICD-10-CM | POA: Insufficient documentation

## 2024-03-23 DIAGNOSIS — R1013 Epigastric pain: Secondary | ICD-10-CM | POA: Insufficient documentation

## 2024-03-23 NOTE — Patient Instructions (Signed)
 Recommend patient be seen in ED for hypotension, atrial fibrillation. She will need serial lab, IV fluids, labs and cardiac monitoring which cannot be done in outpatient clinic.

## 2024-03-23 NOTE — Assessment & Plan Note (Addendum)
 In a fib, based on irregular rhythm. Given low blood pressure, fluctuating heart rate, reduced oral intake, hypotension recommend patient be evaluated in ED for IV hydration, serial labs including hemoglobin given Xarelto  for a fib and cardiac monitoring. This was communicated to both patient and her daughter. Patient and daughter both verbalized understanding and agrees with above plan. Daughter plans on taking the patient to ED at [facility] hospital.

## 2024-03-23 NOTE — Progress Notes (Signed)
 Acute Office Visit  Subjective:    Patient ID: Kelli Smith, female    DOB: 12-15-1940, 83 y.o.   MRN: [id number]  Chief Complaint  Patient presents with   Atrial Fibrillation   Anorexia        HPI Patient is here with her daughter to discuss loss of appetite. History mostly obtained from patient's daughter, who is her primary caregiver.   Patient has experienced weight loss, decreased appetite since June. She underwent cardioversion and follows up with [facility] cardiology/EP for this. Despite being provided with meals, she often does not eat them, claiming she is eating or she already ate.  Her daughter is concerned about potential underlying causes. Patient herself reports she does not have concerns with her appetite. Patient also declines concerns with abdominal pain, blood in stool, chest pain difficulty swallowing.   In regards to a fib, there is a plan for patient to start Tikosyn, which will require a three-day hospitalization. Her daughter would like to make sure there are no other underlying conditions contributing to patient's appetite that could potentially cause her from undergoing Tikosyn treatment.   When reviewing medications, medical history I asked if patient is taking Aricept , indication for the patient. Patient's daughter reports she does not believe patient needs Aricept . Daughter further adds on this medication was started when her sister thought their mom may have memory concerns. Patient's daughter dispense daily medication for the patient and reports she has not been giving Ellie Aricept . Patient has not seen a neurologist to get further evaluation into memory change.   She is currently taking oral iron  supplement daily, metoprolol  twice daily, a daily calcium  and vitamin D  combination tablet, multivitamin every other day, Xarelto  20 mg daily.  She has a history of thyroid  nodules and is followed by Dr. Cherilyn, her endocrinologist, with her next appointment scheduled  for September. She is prescribed Methimazole by her endocrinologist.   Patient lives with her daughter.  ROS As per HPI    Objective:    BP 97/66 (BP Location: Right Arm, Patient Position: Sitting, Cuff Size: Small)   Pulse 67   Ht 5' 3 (1.6 m)   Wt 116 lb 12.8 oz (53 kg)   SpO2 (!) 69%   BMI 20.69 kg/m    Physical Exam Constitutional:      Appearance: She is not toxic-appearing.  HENT:     Head: Normocephalic and atraumatic.     Mouth/Throat:     Mouth: Mucous membranes are moist.  Cardiovascular:     Rate and Rhythm: Rhythm irregular.     Comments: HR fluctuating 40-80/ min Pulmonary:     Effort: Pulmonary effort is normal.     Breath sounds: Normal breath sounds. No wheezing.  Abdominal:     Palpations: Abdomen is soft.     Tenderness: There is guarding (epigastric pain on palpation). There is no rebound.  Musculoskeletal:     Right lower leg: No edema.     Left lower leg: No edema.  Neurological:     Mental Status: She is alert and oriented to person, place, and time.     Comments: Patient alert, oriented to person and place, not to time. Asked patient and daughter about possible intentional refusal to eat; daughter (nurse) became upset, perceiving this as suggesting patient does not want to live. Clarified intent was to establish baseline as I am meeting the patient and her daughter for the first time. Patient was pleasant throughout the visit.  Psychiatric:        Mood and Affect: Affect normal.     Comments: Affect: Appropriate, able to answer questions, though limited by cognitive deficits. Family: Daughter present, engaged, but became defensive during discussion regarding patient's eating habits.     No results found for any visits on 03/23/24.     Assessment & Plan:  Patient is a pleasant 83 year old female here with her daughter for evaluation of loss of appetite.   During today's visit, the patient was alert and oriented to person and place, but not  to time. Concerns were raised regarding the patient's decreased oral intake and possible failure to thrive. I discussed these concerns with both the patient and her daughter. The daughter, who is a Engineer, civil (consulting), became upset during the conversation and expressed discomfort with my line of questioning, despite my attempts to clarify the purpose of the assessment and de-escalate the situation. Given the patient's current condition and the complexity of her presentation, I strongly recommend that she be evaluated in the Emergency Department for further assessment and management of failure to thrive.   Due to ongoing communication challenges and the daughter's defensive demeanor during the visit, I believe the patient would benefit from establishing care with a different primary care provider. I have communicated above findings with our office manager and we will happy to provide a list of local PCPs to facilitate a smooth transition of care.   Failure to thrive in adult Assessment & Plan: Recommend ED evaluation for IV hydration.    Epigastric pain Assessment & Plan: Palpation over epigastric region eliciting pain. Given loss of appetite, daily iron  supplement, Xeralto immediate evaluation in the ED for GI blood loss. Being in outpatient setting I am not able to check hemoglobin stat and manage potential anemia, further evaluation into epigastric pain. Daughter plans on taking Wylie to [facility] ED.    Permanent atrial fibrillation (HCC) Assessment & Plan: In a fib, based on irregular rhythm. Given low blood pressure, fluctuating heart rate, reduced oral intake, hypotension recommend patient be evaluated in ED for IV hydration, serial labs including hemoglobin given Xarelto  for a fib and cardiac monitoring. This was communicated to both patient and her daughter. Patient and daughter both verbalized understanding and agrees with above plan. Daughter plans on taking the patient to ED at [facility] hospital.     No  follow-ups on file.  Luke Shade, MD

## 2024-03-23 NOTE — Assessment & Plan Note (Signed)
 Palpation over epigastric region eliciting pain. Given loss of appetite, daily iron  supplement, Xeralto immediate evaluation in the ED for GI blood loss. Being in outpatient setting I am not able to check hemoglobin stat and manage potential anemia, further evaluation into epigastric pain. Daughter plans on taking Keelee to [facility] ED.

## 2024-03-23 NOTE — Telephone Encounter (Signed)
 Was trying to attempt getting patient's vitals and during the triage, patient daughter kept stating the patient is in currently in A-Fib, that's why you can't get an accurate reading. I asked Dr Abbey to step in as the patient's heart rate was reading no higher than 28. Patient daughter states either she is dying on me and needs to make me aware or something.

## 2024-03-23 NOTE — Assessment & Plan Note (Signed)
 Recommend ED evaluation for IV hydration.

## 2024-03-24 ENCOUNTER — Encounter (HOSPITAL_COMMUNITY): Payer: Self-pay | Admitting: *Deleted

## 2024-03-24 NOTE — Telephone Encounter (Signed)
 Patient daughter states patient is no longer on Donepezil . Medication list updated.

## 2024-04-01 ENCOUNTER — Encounter (HOSPITAL_COMMUNITY): Payer: Self-pay

## 2024-04-01 ENCOUNTER — Other Ambulatory Visit (HOSPITAL_COMMUNITY): Payer: Self-pay

## 2024-04-01 ENCOUNTER — Telehealth (HOSPITAL_COMMUNITY): Payer: Self-pay | Admitting: Pharmacy Technician

## 2024-04-01 NOTE — Telephone Encounter (Signed)
 Patient Product/process development scientist completed.    The patient is insured through [facility]. Patient has Medicare and is not eligible for a copay card, but may be able to apply for patient assistance or Medicare RX Payment Plan (Patient Must reach out to their plan, if eligible for payment plan), if available.    Ran test claim for dofetilide (Tikosyn) 500 mcg and the current 30 day co-pay is $0.00.   This test claim was processed through [facility]- copay amounts may vary at other pharmacies due to pharmacy/plan contracts, or as the patient moves through the different stages of their insurance plan.     Morgan Arab, CPHT Pharmacy Technician III Certified Patient Advocate [facility] Patient Advocate Team Direct Number: [phone]  Fax: [phone]

## 2024-04-04 ENCOUNTER — Encounter (HOSPITAL_COMMUNITY): Payer: Self-pay | Admitting: Physician Assistant

## 2024-04-04 ENCOUNTER — Emergency Department (HOSPITAL_COMMUNITY)

## 2024-04-04 ENCOUNTER — Other Ambulatory Visit: Payer: Self-pay

## 2024-04-04 ENCOUNTER — Ambulatory Visit (HOSPITAL_COMMUNITY)
Admission: RE | Admit: 2024-04-04 | Discharge: 2024-04-04 | Disposition: A | Discharge status: Home / Self Care | Source: Ambulatory Visit | Attending: Physician Assistant | Admitting: Physician Assistant

## 2024-04-04 ENCOUNTER — Inpatient Hospital Stay (HOSPITAL_COMMUNITY)
Admission: EM | Admit: 2024-04-04 | Discharge: 2024-04-09 | DRG: 308 | Disposition: A | Discharge status: Home Health | Attending: Cardiology | Admitting: Cardiology

## 2024-04-04 ENCOUNTER — Encounter (HOSPITAL_COMMUNITY): Payer: Self-pay

## 2024-04-04 ENCOUNTER — Observation Stay (HOSPITAL_COMMUNITY): Admission: RE | Admit: 2024-04-04 | Source: Ambulatory Visit | Admitting: Cardiology

## 2024-04-04 VITALS — BP 104/80 | HR 181 | Ht 63.0 in | Wt 116.6 lb

## 2024-04-04 DIAGNOSIS — I4811 Longstanding persistent atrial fibrillation: Secondary | ICD-10-CM | POA: Insufficient documentation

## 2024-04-04 DIAGNOSIS — I517 Cardiomegaly: Secondary | ICD-10-CM | POA: Diagnosis not present

## 2024-04-04 DIAGNOSIS — I3139 Other pericardial effusion (noninflammatory): Secondary | ICD-10-CM | POA: Diagnosis not present

## 2024-04-04 DIAGNOSIS — Z7901 Long term (current) use of anticoagulants: Secondary | ICD-10-CM | POA: Insufficient documentation

## 2024-04-04 DIAGNOSIS — E860 Dehydration: Secondary | ICD-10-CM | POA: Diagnosis present

## 2024-04-04 DIAGNOSIS — Z85828 Personal history of other malignant neoplasm of skin: Secondary | ICD-10-CM | POA: Diagnosis not present

## 2024-04-04 DIAGNOSIS — E042 Nontoxic multinodular goiter: Secondary | ICD-10-CM | POA: Diagnosis not present

## 2024-04-04 DIAGNOSIS — I4819 Other persistent atrial fibrillation: Secondary | ICD-10-CM

## 2024-04-04 DIAGNOSIS — N179 Acute kidney failure, unspecified: Secondary | ICD-10-CM | POA: Diagnosis present

## 2024-04-04 DIAGNOSIS — I959 Hypotension, unspecified: Secondary | ICD-10-CM | POA: Diagnosis present

## 2024-04-04 DIAGNOSIS — I700 Atherosclerosis of aorta: Secondary | ICD-10-CM | POA: Diagnosis present

## 2024-04-04 DIAGNOSIS — E872 Acidosis, unspecified: Secondary | ICD-10-CM | POA: Diagnosis not present

## 2024-04-04 DIAGNOSIS — Z9842 Cataract extraction status, left eye: Secondary | ICD-10-CM

## 2024-04-04 DIAGNOSIS — Z96653 Presence of artificial knee joint, bilateral: Secondary | ICD-10-CM | POA: Diagnosis present

## 2024-04-04 DIAGNOSIS — Z87442 Personal history of urinary calculi: Secondary | ICD-10-CM

## 2024-04-04 DIAGNOSIS — E0590 Thyrotoxicosis, unspecified without thyrotoxic crisis or storm: Secondary | ICD-10-CM | POA: Diagnosis present

## 2024-04-04 DIAGNOSIS — Z6821 Body mass index (BMI) 21.0-21.9, adult: Secondary | ICD-10-CM | POA: Diagnosis not present

## 2024-04-04 DIAGNOSIS — D6869 Other thrombophilia: Secondary | ICD-10-CM | POA: Diagnosis not present

## 2024-04-04 DIAGNOSIS — I083 Combined rheumatic disorders of mitral, aortic and tricuspid valves: Secondary | ICD-10-CM | POA: Diagnosis not present

## 2024-04-04 DIAGNOSIS — Z9841 Cataract extraction status, right eye: Secondary | ICD-10-CM

## 2024-04-04 DIAGNOSIS — I10 Essential (primary) hypertension: Secondary | ICD-10-CM | POA: Insufficient documentation

## 2024-04-04 DIAGNOSIS — Z7985 Long-term (current) use of injectable non-insulin antidiabetic drugs: Secondary | ICD-10-CM | POA: Diagnosis not present

## 2024-04-04 DIAGNOSIS — Z8249 Family history of ischemic heart disease and other diseases of the circulatory system: Secondary | ICD-10-CM

## 2024-04-04 DIAGNOSIS — I4891 Unspecified atrial fibrillation: Principal | ICD-10-CM

## 2024-04-04 DIAGNOSIS — E0520 Thyrotoxicosis with toxic multinodular goiter without thyrotoxic crisis or storm: Secondary | ICD-10-CM | POA: Diagnosis not present

## 2024-04-04 DIAGNOSIS — I2510 Atherosclerotic heart disease of native coronary artery without angina pectoris: Secondary | ICD-10-CM | POA: Diagnosis present

## 2024-04-04 DIAGNOSIS — M170 Bilateral primary osteoarthritis of knee: Secondary | ICD-10-CM | POA: Diagnosis present

## 2024-04-04 DIAGNOSIS — Z820 Family history of epilepsy and other diseases of the nervous system: Secondary | ICD-10-CM

## 2024-04-04 DIAGNOSIS — R54 Age-related physical debility: Secondary | ICD-10-CM | POA: Diagnosis present

## 2024-04-04 DIAGNOSIS — Z79899 Other long term (current) drug therapy: Secondary | ICD-10-CM | POA: Diagnosis not present

## 2024-04-04 DIAGNOSIS — I4719 Other supraventricular tachycardia: Secondary | ICD-10-CM | POA: Diagnosis present

## 2024-04-04 DIAGNOSIS — E43 Unspecified severe protein-calorie malnutrition: Secondary | ICD-10-CM | POA: Diagnosis present

## 2024-04-04 LAB — BASIC METABOLIC PANEL WITH GFR
Anion gap: 17 — ABNORMAL HIGH (ref 5–15)
Anion gap: 13 (ref 5–15)
BUN: 66 mg/dL — ABNORMAL HIGH (ref 8–23)
BUN: 63 mg/dL — ABNORMAL HIGH (ref 8–23)
CO2: 25 mmol/L (ref 22–32)
CO2: 22 mmol/L (ref 22–32)
Calcium: 10.5 mg/dL — ABNORMAL HIGH (ref 8.9–10.3)
Calcium: 9.2 mg/dL (ref 8.9–10.3)
Chloride: 104 mmol/L (ref 98–111)
Chloride: 107 mmol/L (ref 98–111)
Creatinine, Ser: 1.31 mg/dL — ABNORMAL HIGH (ref 0.44–1.00)
Creatinine, Ser: 1.26 mg/dL — ABNORMAL HIGH (ref 0.44–1.00)
GFR, Estimated: 40 mL/min — ABNORMAL LOW (ref >60)
GFR, Estimated: 42 mL/min — ABNORMAL LOW (ref >60)
Glucose, Bld: 123 mg/dL — ABNORMAL HIGH (ref 70–99)
Glucose, Bld: 110 mg/dL — ABNORMAL HIGH (ref 70–99)
Potassium: 4.1 mmol/L (ref 3.5–5.1)
Potassium: 4.1 mmol/L (ref 3.5–5.1)
Sodium: 146 mmol/L — ABNORMAL HIGH (ref 135–145)
Sodium: 142 mmol/L (ref 135–145)

## 2024-04-04 LAB — CBC WITH DIFFERENTIAL/PLATELET
Abs Immature Granulocytes: 0.02 K/uL (ref 0.00–0.07)
Basophils Absolute: 0 K/uL (ref 0.0–0.1)
Basophils Relative: 0 %
Eosinophils Absolute: 0 K/uL (ref 0.0–0.5)
Eosinophils Relative: 0 %
HCT: 44.6 % (ref 36.0–46.0)
Hemoglobin: 14.2 g/dL (ref 12.0–15.0)
Immature Granulocytes: 0 %
Lymphocytes Relative: 17 %
Lymphs Abs: 1.6 K/uL (ref 0.7–4.0)
MCH: 23.8 pg — ABNORMAL LOW (ref 26.0–34.0)
MCHC: 31.8 g/dL (ref 30.0–36.0)
MCV: 74.7 fL — ABNORMAL LOW (ref 80.0–100.0)
Monocytes Absolute: 0.7 K/uL (ref 0.1–1.0)
Monocytes Relative: 7 %
Neutro Abs: 6.8 K/uL (ref 1.7–7.7)
Neutrophils Relative %: 76 %
Platelets: 188 K/uL (ref 150–400)
RBC: 5.97 MIL/uL — ABNORMAL HIGH (ref 3.87–5.11)
RDW: 17.8 % — ABNORMAL HIGH (ref 11.5–15.5)
WBC: 9.1 K/uL (ref 4.0–10.5)
nRBC: 1.1 % — ABNORMAL HIGH (ref 0.0–0.2)

## 2024-04-04 LAB — I-STAT CHEM 8, ED
BUN: 56 mg/dL — ABNORMAL HIGH (ref 8–23)
Calcium, Ion: 1.14 mmol/L — ABNORMAL LOW (ref 1.15–1.40)
Chloride: 109 mmol/L (ref 98–111)
Creatinine, Ser: 1.2 mg/dL — ABNORMAL HIGH (ref 0.44–1.00)
Glucose, Bld: 93 mg/dL (ref 70–99)
HCT: 36 % (ref 36.0–46.0)
Hemoglobin: 12.2 g/dL (ref 12.0–15.0)
Potassium: 3.1 mmol/L — ABNORMAL LOW (ref 3.5–5.1)
Sodium: 146 mmol/L — ABNORMAL HIGH (ref 135–145)
TCO2: 24 mmol/L (ref 22–32)

## 2024-04-04 LAB — I-STAT CG4 LACTIC ACID, ED: Lactic Acid, Venous: 2.4 mmol/L (ref 0.5–1.9)

## 2024-04-04 LAB — MAGNESIUM: Magnesium: 1.8 mg/dL (ref 1.7–2.4)

## 2024-04-04 LAB — BRAIN NATRIURETIC PEPTIDE: B Natriuretic Peptide: 614.7 pg/mL — ABNORMAL HIGH (ref 0.0–100.0)

## 2024-04-04 LAB — TSH: TSH: 1.659 u[IU]/mL (ref 0.350–4.500)

## 2024-04-04 LAB — LACTIC ACID, PLASMA: Lactic Acid, Venous: 2.1 mmol/L (ref 0.5–1.9)

## 2024-04-04 LAB — TROPONIN I (HIGH SENSITIVITY)
Troponin I (High Sensitivity): 71 ng/L — ABNORMAL HIGH (ref <18)
Troponin I (High Sensitivity): 56 ng/L — ABNORMAL HIGH (ref <18)

## 2024-04-04 MED ORDER — DIGOXIN 0.25 MG/ML IJ SOLN
0.1250 mg | Freq: Once | INTRAMUSCULAR | Status: DC
  Filled 2024-04-04: qty 0.5

## 2024-04-04 MED ORDER — SODIUM CHLORIDE 0.9% FLUSH
10.0000 mL | INTRAVENOUS | Status: DC | PRN
  Administered 2024-04-07: 10 mL

## 2024-04-04 MED ORDER — SODIUM CHLORIDE 0.9 % IV BOLUS (SEPSIS): 1000.0000 mL | Freq: Once | INTRAVENOUS | Status: DC

## 2024-04-04 MED ORDER — ETOMIDATE 2 MG/ML IV SOLN
INTRAVENOUS | Status: AC | PRN
  Administered 2024-04-04: 7.94 mg via INTRAVENOUS

## 2024-04-04 MED ORDER — ENSURE PLUS HIGH PROTEIN PO LIQD
237.0000 mL | Freq: Two times a day (BID) | ORAL | Status: DC
  Administered 2024-04-04 – 2024-04-05 (×2): 237 mL via ORAL

## 2024-04-04 MED ORDER — AMIODARONE HCL IN DEXTROSE 360-4.14 MG/200ML-% IV SOLN
30.0000 mg/h | INTRAVENOUS | Status: AC
  Administered 2024-04-04: 30 mg/h via INTRAVENOUS
  Administered 2024-04-05 – 2024-04-06 (×6): 60 mg/h via INTRAVENOUS
  Administered 2024-04-07: 30 mg/h via INTRAVENOUS
  Administered 2024-04-07: 60 mg/h via INTRAVENOUS
  Administered 2024-04-07 – 2024-04-08 (×2): 30 mg/h via INTRAVENOUS
  Filled 2024-04-04 (×11): qty 200

## 2024-04-04 MED ORDER — AMIODARONE IV BOLUS ONLY 150 MG/100ML
150.0000 mg | Freq: Once | INTRAVENOUS | Status: AC
  Administered 2024-04-04: 150 mg via INTRAVENOUS
  Filled 2024-04-04: qty 100

## 2024-04-04 MED ORDER — RIVAROXABAN 10 MG PO TABS: 20.0000 mg | ORAL_TABLET | Freq: Every day | ORAL | Status: DC

## 2024-04-04 MED ORDER — PROPOFOL 10 MG/ML IV BOLUS
INTRAVENOUS | Status: AC
  Filled 2024-04-04: qty 20

## 2024-04-04 MED ORDER — DOFETILIDE 250 MCG PO CAPS: 250.0000 ug | ORAL_CAPSULE | Freq: Two times a day (BID) | ORAL | Status: DC

## 2024-04-04 MED ORDER — SODIUM CHLORIDE 0.9 % IV BOLUS (SEPSIS)
1000.0000 mL | Freq: Once | INTRAVENOUS | Status: AC
  Administered 2024-04-04: 1000 mL via INTRAVENOUS

## 2024-04-04 MED ORDER — SODIUM CHLORIDE 0.9% FLUSH
10.0000 mL | Freq: Two times a day (BID) | INTRAVENOUS | Status: DC
  Administered 2024-04-04 – 2024-04-09 (×9): 10 mL

## 2024-04-04 MED ORDER — VITAMIN C 500 MG PO TABS
500.0000 mg | ORAL_TABLET | Freq: Every day | ORAL | Status: DC
  Administered 2024-04-04 – 2024-04-09 (×6): 500 mg via ORAL
  Filled 2024-04-04 (×7): qty 1

## 2024-04-04 MED ORDER — SODIUM CHLORIDE 0.9 % IV SOLN: Freq: Once | INTRAVENOUS | Status: AC

## 2024-04-04 MED ORDER — SODIUM CHLORIDE 0.9 % IV SOLN: 1000.0000 mL | INTRAVENOUS | Status: DC

## 2024-04-04 MED ORDER — SODIUM CHLORIDE 0.9 % IV SOLN: 250.0000 mL | INTRAVENOUS | Status: AC | PRN

## 2024-04-04 MED ORDER — ACETAMINOPHEN 325 MG PO TABS
650.0000 mg | ORAL_TABLET | Freq: Three times a day (TID) | ORAL | Status: DC
  Administered 2024-04-04 – 2024-04-09 (×15): 650 mg via ORAL
  Filled 2024-04-04 (×16): qty 2

## 2024-04-04 MED ORDER — POTASSIUM CHLORIDE 20 MEQ PO PACK
60.0000 meq | PACK | Freq: Once | ORAL | Status: AC
  Administered 2024-04-04: 60 meq via ORAL
  Filled 2024-04-04: qty 3

## 2024-04-04 MED ORDER — CALCIUM 600+D3 PLUS MINERALS 600-800 MG-UNIT PO TABS: 1.0000 | ORAL_TABLET | Freq: Every day | ORAL | Status: DC

## 2024-04-04 MED ORDER — METHIMAZOLE 2.5 MG HALF TABLET
2.5000 mg | ORAL_TABLET | Freq: Every day | ORAL | Status: DC
  Administered 2024-04-05 – 2024-04-08 (×4): 2.5 mg via ORAL
  Filled 2024-04-04 (×4): qty 1

## 2024-04-04 MED ORDER — OYSTER SHELL CALCIUM/D3 500-5 MG-MCG PO TABS
1.0000 | ORAL_TABLET | Freq: Every day | ORAL | Status: DC
  Administered 2024-04-05 – 2024-04-09 (×5): 1 via ORAL
  Filled 2024-04-04 (×5): qty 1

## 2024-04-04 MED ORDER — DEXTROSE 5 % IV SOLN: INTRAVENOUS | Status: DC

## 2024-04-04 MED ORDER — ETOMIDATE 2 MG/ML IV SOLN: 0.1500 mg/kg | Freq: Once | INTRAVENOUS | Status: DC

## 2024-04-04 MED ORDER — MAGNESIUM SULFATE 2 GM/50ML IV SOLN
2.0000 g | Freq: Once | INTRAVENOUS | Status: AC
  Administered 2024-04-04: 2 g via INTRAVENOUS
  Filled 2024-04-04: qty 50

## 2024-04-04 MED ORDER — SODIUM CHLORIDE 0.9% FLUSH: 3.0000 mL | INTRAVENOUS | Status: DC | PRN

## 2024-04-04 MED ORDER — AMIODARONE HCL IN DEXTROSE 360-4.14 MG/200ML-% IV SOLN
60.0000 mg/h | INTRAVENOUS | Status: AC
  Administered 2024-04-04: 60 mg/h via INTRAVENOUS
  Filled 2024-04-04: qty 200

## 2024-04-04 MED ORDER — DIGOXIN 125 MCG PO TABS: 0.1250 mg | ORAL_TABLET | Freq: Every day | ORAL | Status: DC

## 2024-04-04 MED ORDER — ADULT MULTIVITAMIN W/MINERALS CH
1.0000 | ORAL_TABLET | ORAL | Status: DC
  Administered 2024-04-06 – 2024-04-09 (×3): 1 via ORAL
  Filled 2024-04-04 (×5): qty 1

## 2024-04-04 MED ORDER — METOPROLOL SUCCINATE ER 25 MG PO TB24
25.0000 mg | ORAL_TABLET | Freq: Every day | ORAL | Status: DC
  Filled 2024-04-04: qty 1

## 2024-04-04 MED ORDER — INFLUENZA VAC SPLIT HIGH-DOSE 0.5 ML IM SUSY
0.5000 mL | PREFILLED_SYRINGE | INTRAMUSCULAR | Status: DC
Start: 2024-04-05 — End: 2024-04-09
  Filled 2024-04-04: qty 0.5

## 2024-04-04 MED ORDER — SODIUM CHLORIDE 0.9% FLUSH
3.0000 mL | Freq: Two times a day (BID) | INTRAVENOUS | Status: DC
  Administered 2024-04-04 – 2024-04-09 (×10): 3 mL via INTRAVENOUS

## 2024-04-04 MED ORDER — RIVAROXABAN 15 MG PO TABS
15.0000 mg | ORAL_TABLET | Freq: Every day | ORAL | Status: DC
  Administered 2024-04-04: 15 mg via ORAL
  Filled 2024-04-04 (×2): qty 1

## 2024-04-04 NOTE — Progress Notes (Signed)
   04/04/24 1422  Assess: MEWS Score  Temp (!) 97.3 F (36.3 C)  BP (!) 86/72  MAP (mmHg) 78  Pulse Rate (!) 152  ECG Heart Rate (!) 155  Resp 20  SpO2 94 %  O2 Device Room Air  Assess: MEWS Score  MEWS Temp 0  MEWS Systolic 1  MEWS Pulse 3  MEWS RR 0  MEWS LOC 2  MEWS Score 6  MEWS Score Color Red  Assess: if the MEWS score is Yellow or Red  Were vital signs accurate and taken at a resting state? Yes  Does the patient meet 2 or more of the SIRS criteria? No  MEWS guidelines implemented  Yes, red  Treat  MEWS Interventions Considered administering scheduled or prn medications/treatments as ordered  Take Vital Signs  Increase Vital Sign Frequency  Red: Q1hr x2, continue Q4hrs until patient remains green for 12hrs  Escalate  MEWS: Escalate Red: Discuss with charge nurse and notify provider. Consider notifying RRT. If remains red for 2 hours consider need for higher level of care  Notify: Charge Nurse/RN  Name of Charge Nurse/RN Notified Sarah, RN  Provider Notification  Provider Name/Title Chantal Needle, NP  Date Provider Notified 04/04/24  Time Provider Notified 1434  Method of Notification Page  Notification Reason Change in status  Provider response See new orders  Assess: SIRS CRITERIA  SIRS Temperature  0  SIRS Respirations  0  SIRS Pulse 1  SIRS WBC 0  SIRS Score Sum  1

## 2024-04-04 NOTE — ED Provider Notes (Signed)
 [facility]GENC[facility] DEPARTMENT AT [facility] Provider Note   CSN: [id number] Arrival date & time: 04/04/24  9072     Patient presents with: Tachycardia   Kelli Smith is a 83 y.o. female.   HPI Patient has history of rapid atrial fibrillation.  She was at the A-fib clinic today to discuss initiation of Tikosyn  and was found to have rapid rate in the 180s.  Patient was referred to the emergency department for cardioversion and anticipated admission.  Patient denies she has chest pain or feel short of breath.  However, patient's daughter clarifies that the patient has been very weak and visibly short of breath and eating poorly.  Patient daughter reports the patient is compliant with Xarelto .  She has had several episodes of cardioversion previously without successful permanent cardioversion.    Prior to Admission medications   Medication Sig Start Date End Date Taking? Authorizing Provider  acetaminophen  (TYLENOL ) 650 MG CR tablet Take 1,300 mg by mouth every 8 (eight) hours as needed for pain.    [provider]  Ascorbic Acid  (VITAMIN C  PO) Take 500 mg by mouth daily.    [provider]  Calcium  Carbonate-Vit D-Min (CALCIUM  600+D3 PLUS MINERALS PO) Take 600 mg by mouth 2 (two) times daily.    [provider]  methimazole  (TAPAZOLE ) 5 MG tablet Take by mouth. Patient taking differently: Take 2.5 mg by mouth daily. 07/08/23   [provider]  metoprolol  succinate (TOPROL  XL) 25 MG 24 hr tablet Take 1 tablet (25 mg total) by mouth in the morning and at bedtime. 03/16/24   Cindie Ole DASEN, MD  Multiple Vitamins-Minerals (MULTIVITAMIN WITH MINERALS) tablet Take 1 tablet by mouth every other day.    [provider]    Allergies: Patient has no known allergies.    Review of Systems  Updated Vital Signs BP (!) 79/50   Pulse (!) 31   Temp (!) 97 F (36.1 C) (Oral)   Resp (!) 23   SpO2 100%   Physical Exam Constitutional:       Comments: Patient extremely frail in appearance.  Alert.  No respiratory distress at rest.  HENT:     Head: Normocephalic and atraumatic.     Mouth/Throat:     Pharynx: Oropharynx is clear.     Comments: Dentures upper and lower Eyes:     Extraocular Movements: Extraocular movements intact.  Cardiovascular:     Rate and Rhythm: Tachycardia present. Rhythm irregular.  Pulmonary:     Effort: Pulmonary effort is normal.     Breath sounds: Normal breath sounds.  Abdominal:     General: There is no distension.     Palpations: Abdomen is soft.     Tenderness: There is no abdominal tenderness. There is no guarding.  Musculoskeletal:     Comments: About 2+ swelling around the ankles and lower legs.  Calves are symmetric and pliable.  Skin:    General: Skin is warm and dry.     Coloration: Skin is pale.  Neurological:     Comments: Patient is fatigued in appearance and generally weak.  She does not have focal neurologic deficit.  She is answering questions but defers most responses to her daughter.     (all labs ordered are listed, but only abnormal results are displayed) Labs Reviewed  BASIC METABOLIC PANEL WITH GFR - Abnormal; Notable for the following components:      Result Value   Sodium 146 (*)  Glucose, Bld 123 (*)    BUN 66 (*)    Creatinine, Ser 1.31 (*)    Calcium  10.5 (*)    GFR, Estimated 40 (*)    Anion gap 17 (*)    All other components within normal limits  CBC WITH DIFFERENTIAL/PLATELET - Abnormal; Notable for the following components:   RBC 5.97 (*)    MCV 74.7 (*)    MCH 23.8 (*)    RDW 17.8 (*)    nRBC 1.1 (*)    All other components within normal limits  BRAIN NATRIURETIC PEPTIDE - Abnormal; Notable for the following components:   B Natriuretic Peptide 614.7 (*)    All other components within normal limits  TROPONIN I (HIGH SENSITIVITY) - Abnormal; Notable for the following components:   Troponin I (High Sensitivity) 71 (*)    All other components  within normal limits  MAGNESIUM   URINALYSIS, ROUTINE W REFLEX MICROSCOPIC  TSH  I-STAT CHEM 8, ED  I-STAT CG4 LACTIC ACID, ED  I-STAT CG4 LACTIC ACID, ED  TROPONIN I (HIGH SENSITIVITY)    EKG: EKG Interpretation Date/Time:  Monday April 04 2024 10:58:33 EDT Ventricular Rate:  139 PR Interval:  133 QRS Duration:  81 QT Interval:  343 QTC Calculation: 487 R Axis:   9  Text Interpretation: afib Borderline repolarization abnormality Borderline prolonged QT interval   Confirmed by Armenta Canning [phone]) on 04/04/2024 11:58:53 AM  Radiology: [facility] Chest Port 1 View Result Date: 04/04/2024 CLINICAL DATA:  Atrial fibrillation with rapid ventricular response. EXAM: PORTABLE CHEST 1 VIEW COMPARISON:  Radiograph 01/20/2024.  CT 11/21/2020. FINDINGS: 1031 hours. The heart size and mediastinal contours are stable with cardiomegaly, aortic atherosclerosis and known substernal extension of thyroid  tissue on the right. Interval improved aeration of the lung bases which are clear. There is no pleural effusion or pneumothorax. No acute osseous findings are demonstrated. Glenohumeral degenerative changes and evidence chronic rotator cuff tears bilaterally are again noted. IMPRESSION: No evidence of acute cardiopulmonary process. Stable cardiomegaly with interval improved aeration of the lung bases. Electronically Signed   By: Elsie Perone M.D.   On: 04/04/2024 10:45     .Cardioversion  Date/Time: 04/04/2024 11:00 AM  Performed by: Armenta Canning, MD Authorized by: Armenta Canning, MD   Consent:    Consent obtained:  Verbal   Consent given by:  Patient and healthcare agent   Risks discussed:  Death, induced arrhythmia and pain Pre-procedure details:    Cardioversion basis:  Emergent   Rhythm:  Atrial fibrillation   Electrode placement:  Anterior-posterior Patient sedated: Yes. Refer to sedation procedure documentation for details of sedation.  Attempt one:    Cardioversion mode:   Synchronous   Waveform:  Biphasic   Shock (Joules):  120   Shock outcome:  No change in rhythm Attempt two:    Cardioversion mode:  Synchronous   Waveform:  Biphasic   Shock (Joules):  200   Shock outcome:  No change in rhythm Comments:     After each cardioversion attempt there was a brief pause with a couple of sinus beats which immediately reverted to atrial fibrillation.  .Sedation  Date/Time: 04/04/2024 11:02 AM  Performed by: Armenta Canning, MD Authorized by: Armenta Canning, MD   Consent:    Consent obtained:  Verbal   Consent given by:  Patient and healthcare agent   Risks discussed:  Allergic reaction, dysrhythmia, prolonged sedation necessitating reversal, prolonged hypoxia resulting in organ damage, inadequate sedation, respiratory compromise necessitating ventilatory assistance  and intubation, nausea and vomiting Universal protocol:    Immediately prior to procedure, a time out was called: yes   Indications:    Procedure performed:  Cardioversion   Procedure necessitating sedation performed by:  Physician performing sedation Pre-sedation assessment:    Time since last food or drink:  12   NPO status caution: urgency dictates proceeding with non-ideal NPO status     ASA classification: class 4 - patient with severe systemic disease that is a constant threat to life     Mouth opening:  2 finger widths   Mallampati score:  I - soft palate, uvula, fauces, pillars visible   Neck mobility: reduced     Pre-sedation assessments completed and reviewed: airway patency, cardiovascular function, hydration status, mental status, nausea/vomiting, pain level and respiratory function   A pre-sedation assessment was completed prior to the start of the procedure Immediate pre-procedure details:    Reviewed: vital signs, relevant labs/tests and NPO status     Verified: bag valve mask available, emergency equipment available, intubation equipment available, IV patency confirmed, oxygen  available and reversal medications available   Procedure details (see MAR for exact dosages):    Preoxygenation:  Nasal cannula   Sedation:  Etomidate    Intended level of sedation: deep   Intra-procedure monitoring:  Cardiac monitor, blood pressure monitoring, continuous capnometry, continuous pulse oximetry, frequent LOC assessments and frequent vital sign checks   Intra-procedure events: hypotension     Intra-procedure management:  Fluid bolus and airway repositioning   Total Provider sedation time (minutes):  30 Post-procedure details:   A post-sedation assessment was completed following the completion of the procedure.   Attendance: Constant attendance by certified staff until patient recovered     CRITICAL CARE Performed by: Ludivina Shines   Total critical care time: 60 minutes  Critical care time was exclusive of separately billable procedures and treating other patients.  Critical care was necessary to treat or prevent imminent or life-threatening deterioration.  Critical care was time spent personally by me on the following activities: development of treatment plan with patient and/or surrogate as well as nursing, discussions with consultants, evaluation of patient's response to treatment, examination of patient, obtaining history from patient or surrogate, ordering and performing treatments and interventions, ordering and review of laboratory studies, ordering and review of radiographic studies, pulse oximetry and re-evaluation of patient's condition.  Medications Ordered in the ED  etomidate  (AMIDATE ) injection 7.94 mg ( Intravenous Canceled Entry 04/04/24 1048)  metoprolol  succinate (TOPROL -XL) 24 hr tablet 25 mg (has no administration in time range)  methIMAzole  (TAPAZOLE ) tablet 2.5 mg (has no administration in time range)  multivitamin with minerals tablet 1 tablet (has no administration in time range)  ascorbic acid  (VITAMIN C ) tablet 500 mg (has no administration in time  range)  Calcium  600+D3 Plus Minerals 600-800 MG-UNIT TABS 1 tablet (has no administration in time range)  acetaminophen  (TYLENOL ) tablet 650 mg (has no administration in time range)  sodium chloride  0.9 % bolus 1,000 mL (1,000 mLs Intravenous New Bag/Given 04/04/24 1159)  0.9 %  sodium chloride  infusion (0 mLs Intravenous Stopped 04/04/24 1157)  etomidate  (AMIDATE ) injection (7.94 mg Intravenous Given 04/04/24 1041)                                    Medical Decision Making Amount and/or Complexity of Data Reviewed Labs: ordered. Radiology: ordered.  Risk Prescription drug management.  Patient presents as outlined from EP clinic.  She is in rapid atrial fibrillation.  I reviewed plan with Devere Needle, NP for EP clinic.  Will proceed with cardioversion.  2 attempts at cardioversion patient maintained a rapid rhythm.  This appears consistent with atrial fibrillation although at times there are some pauses and possible paroxysmal atrial tachycardia.  Metabolic panel shows sodium 146 potassium 4.1 BUN 66 creatinine 1.3 GFR 40.  Patient's metabolic panel consistent with significant dehydration.  Patient's daughter reports she has had a lot of difficulty hydrating and eating.  Will continue fluid resuscitation.  Patient was given 1 L of normal saline over the course of cardioversions.  Will add second liter IV fluids for significant dehydration in the setting of hypotension.  Post cardioversion the patient is awake.  Pressures have been soft.  Heart rates variable from 90s to 160s.  Patient continues to tolerate this.  Patient will be admitted to EP service with plan for initiating Tikosyn  this evening.  Patient's daughter has been at bedside participating in care and getting updates throughout the course of the patient's management.     Final diagnoses:  Atrial fibrillation with RVR [facility])    ED Discharge Orders     None          Armenta Canning, MD 04/04/24 1203

## 2024-04-04 NOTE — Progress Notes (Addendum)
 Paged Dr. Lars to ask if Pt. Can be started on IV fluids since she has very minimal PO intake.

## 2024-04-04 NOTE — Progress Notes (Signed)
 Pharmacy Electrolyte Replacement  Recent Labs:  Recent Labs    04/04/24 0948 04/04/24 1222  K 4.1 3.1*  MG 1.8  --   CREATININE 1.31* 1.20*    Low Critical Values (K </= 2.5, Phos </= 1, Mg </= 1) Present: None  MD Contacted: n/a  Plan: Potassium replacement ordered by provider Magnesium  sulfate 2 g IV x 1   Rankin Sams, PharmD, BCPS, BCCCP Clinical Pharmacist

## 2024-04-04 NOTE — ED Triage Notes (Signed)
 Pt form a fib clinic for HR 180, pt denies any complaints at this time.

## 2024-04-04 NOTE — Sedation Documentation (Signed)
Sync shock at 200J

## 2024-04-04 NOTE — Progress Notes (Signed)

## 2024-04-04 NOTE — H&P (Signed)
 Primary Care Physician: Pcp, No Primary Cardiologist: Redell Cave, MD Electrophysiologist: OLE ONEIDA HOLTS, MD  Referring Physician: Dr HOLTS Kelli Smith is a 83 y.o. female with a history of HTN, hyperthyroidism, aortic atherosclerosis, atrial flutter, atrial fibrillation who presents for follow up in the [facility].  The patient underwent DCCV on 02/01/24 but had quick return of afib. She was seen by Dr HOLTS who recommended dofetilide . Patient is on Xarelto  for stroke prevention.    Patient presented today in AFib clinic for follow up for atrial fibrillation and dofetilide  loading. Her daughter reported that she feels poorly with extreme fatigue. No presyncope.   Currently, nursing attempting to insert PIV in ER. She denies chest pain, increased edema.   Atrial Fibrillation Risk Factors:  she does not have symptoms or diagnosis of sleep apnea. she does not have a history of rheumatic fever.   Atrial Fibrillation Management history:  Previous antiarrhythmic drugs: amiodarone  (thyroid ) Previous cardioversions: 2022 x 2, 02/01/24 Previous ablations: none Anticoagulation history: Xarelto    ROS- All systems are reviewed and negative except as per the HPI above.  Past Medical History:  Diagnosis Date   A-fib [facility])    a.) CHA2DS2-VASc = 5 (age x 2, sex, HTN, aortic plaque). b.) s/p DCCV (200J x 2) 11/07/2020. c.) rate/rhythm maintained on oral amiodarone ; chronically anticoagulated using rivaroxaban .   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/2020   B12 deficiency    Basal cell carcinoma    CAD (coronary artery disease)    CAD (coronary artery disease) 11/22/2020   Cyst of right kidney    DDD (degenerative disc disease), lumbar    Hematuria 04/05/2020   History of chicken pox    History of kidney  stones    HTN (hypertension)    Knee pain, bilateral 06/11/2018   Long term current use of anticoagulant    a.) rivaroxaban    Memory loss    Microcytosis    Osteopenia    PAC (premature atrial contraction)    Pain of right heel 04/05/2020   Premature atrial contraction 09/20/2019   Primary osteoarthritis of both knees 09/30/2020   Right shoulder pain 06/11/2018   Thyromegaly    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     Ascorbic Acid  (VITAMIN C  PO) Take 500 mg by mouth daily.     Calcium  Carbonate-Vit D-Min (CALCIUM  600+D3 PLUS MINERALS PO) Take 600 mg by mouth 2 (two) times daily.     methimazole  (TAPAZOLE ) 5 MG tablet Take by mouth. (Patient taking differently: Take 2.5 mg by mouth daily.)     metoprolol  succinate (TOPROL  XL) 25 MG 24 hr tablet Take 1 tablet (25 mg total) by mouth in the morning and at bedtime. 180 tablet 3   Multiple Vitamins-Minerals (MULTIVITAMIN WITH MINERALS) tablet Take 1 tablet by mouth every other day.     XARELTO  20 MG TABS tablet TAKE 1 TABLET BY MOUTH  DAILY WITH SUPPER. 30 tablet 5    Physical Exam: BP (!) 101/53   Pulse (!) 175   Temp (!) 97.3 F (36.3 C) (Oral)   Resp 13   SpO2 97%   GEN: Well nourished, well developed in no acute distress CARDIAC: Irregularly irregular rate and rhythm, no murmurs, rubs, gallops RESPIRATORY:  Clear to auscultation without rales, wheezing or rhonchi  ABDOMEN: Soft, non-tender, non-distended EXTREMITIES:  No edema; No deformity   Wt Readings from Last 3 Encounters:  04/04/24 52.9 kg  03/23/24 53 kg  03/16/24 55 kg     EKG today  demonstrates  Afib with RVR Vent. rate 181 BPM PR interval * ms QRS duration 70 ms QT/QTcB 244/423 ms   Echo 03/10/24 demonstrated   1. Left ventricular ejection fraction, by estimation, is 55 to 60%. The left ventricle has normal function. The left ventricle has no regional wall motion abnormalities. There is mild left ventricular hypertrophy. Left ventricular diastolic parameters are indeterminate.   2. Right ventricular systolic function is normal. The right ventricular size is normal. There is normal pulmonary artery systolic pressure. The estimated right ventricular systolic pressure is 25.4 mmHg.   3. Left atrial size was moderately dilated.   4. A small pericardial effusion is present off the RV free wall and RA.   5. The mitral valve is normal in structure. Mild mitral valve regurgitation. No evidence of mitral stenosis. Moderate mitral annular calcification.   6. Tricuspid valve regurgitation is mild to moderate.   7. The aortic valve is normal in structure. Aortic valve regurgitation is moderate. Aortic valve sclerosis/calcification is present, without any evidence of aortic stenosis.   8. The inferior vena cava is normal in size with greater than 50% respiratory variability, suggesting right atrial pressure of 3 mmHg.    CHA2DS2-VASc Score = 5  The patient's score is based upon: CHF History: 0 HTN History: 1 Diabetes History: 0 Stroke History: 0 Vascular Disease History: 1 Age Score: 2 Gender Score: 1       ASSESSMENT AND PLAN: Persistent Atrial Fibrillation (ICD10:  I48.19) The patient's CHA2DS2-VASc score is 5, indicating a 7.2% annual risk of stroke.   Presented to AFib clinic earlier today in prep for tikosyn  admission, found to be in afib w RVR to 180s with increased fatigue and recommended to proceed to ER Recommend DCCV in ER, then admit for planned tikosyn  loading  Continue Xarelto  20 mg daily Continue Toprol  25 mg BID Keep K > 4, Mag > 2  Secondary  Hypercoagulable State (ICD10:  D68.69) The patient is at significant risk for stroke/thromboembolism based upon her CHA2DS2-VASc Score of 5.  Continue Rivaroxaban  (Xarelto ). No bleeding issues.   HTN Stable on current regimen      Kelli Kellen, NP Electrophysiology 04/04/24 10:30 AM

## 2024-04-04 NOTE — Sedation Documentation (Signed)
 Sync shock at 120J

## 2024-04-04 NOTE — ED Notes (Addendum)
 Attempted to give PO meds unsuccessfully. Pt was unable to take them due to gagging whenever the pill or water got close to her mouth. Pt was gagging putting dentures back into her mouth. Daughter reports this has been happening for about a week.

## 2024-04-04 NOTE — Evaluation (Signed)
 Clinical/Bedside Swallow Evaluation Patient Details  Name: Kelli Smith MRN: [id number] Date of Birth: May 27, 1941  Today's Date: 04/04/2024 Time: SLP Start Time (ACUTE ONLY): 1639 SLP Stop Time (ACUTE ONLY): 1653 SLP Time Calculation (min) (ACUTE ONLY): 14 min  Past Medical History:  Past Medical History:  Diagnosis Date   A-fib (HCC)    a.) CHA2DS2-VASc = 5 (age x 2, sex, HTN, aortic plaque). b.) s/p DCCV (200J x 2) 11/07/2020. c.) rate/rhythm maintained on oral amiodarone ; chronically anticoagulated using rivaroxaban .   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/2020   B12 deficiency    Basal cell carcinoma    CAD (coronary artery disease)    CAD (coronary artery disease) 11/22/2020   Cyst of right kidney    DDD (degenerative disc disease), lumbar    Hematuria 04/05/2020   History of chicken pox    History of kidney stones    HTN (hypertension)    Knee pain, bilateral 06/11/2018   Long term current use of anticoagulant    a.) rivaroxaban    Memory loss    Microcytosis    Osteopenia    PAC (premature atrial contraction)    Pain of right heel 04/05/2020   Premature atrial contraction 09/20/2019   Primary osteoarthritis of both knees 09/30/2020   Right shoulder pain 06/11/2018   Thyromegaly    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   Past Surgical History:  Past Surgical History:  Procedure Laterality Date   BUNIONECTOMY WITH HAMMERTOE RECONSTRUCTION  2009   CARDIOVERSION N/A 11/07/2020   Procedure: CARDIOVERSION (200J x 2); Location: [facility]; Surgeon: Redell Cave, MD   CARDIOVERSION N/A 02/01/2024   Procedure: CARDIOVERSION;  Surgeon: Cave Redell, MD;  Location: [facility] ORS;  Service: Cardiovascular;  Laterality: N/A;   CATARACT EXTRACTION Bilateral    2013/2014   CESAREAN SECTION N/A 1978   KNEE ARTHROPLASTY Left 12/12/2020    Procedure: COMPUTER ASSISTED TOTAL KNEE ARTHROPLASTY;  Surgeon: Mardee Lynwood SQUIBB, MD;  Location: [facility] ORS;  Service: Orthopedics;  Laterality: Left;   KNEE ARTHROPLASTY Right 10/28/2021   Procedure: COMPUTER ASSISTED TOTAL KNEE ARTHROPLASTY;  Surgeon: Mardee Lynwood SQUIBB, MD;  Location: [facility] ORS;  Service: Orthopedics;  Laterality: Right;   TEE WITHOUT CARDIOVERSION N/A 11/07/2020   Procedure: TRANSESOPHAGEAL ECHOCARDIOGRAM (TEE);  Surgeon: Cave Redell, MD;  Location: [facility] ORS;  Service: Cardiovascular;  Laterality: N/A;   HPI:  Kelli Smith is an 83 yo female with PMH of A-fib presenting to ED from A-fib clinic 9/8 for cardioversion after being found in A-fib with RVR to the 180s with increased fatigue and limited PO intake.    Assessment / Plan / Recommendation  Clinical Impression  Pt presents with an aversion to all foods and drinks. Her upper dentures are straight rather than curving anteriorly along the hard palate, which may contribute to the need to gag though she states her dentures are not new. She is hesitant to accept POs, though initiates a swallow and clears her oral cavity despite reports of needing to expectorate. Gagging was not observed before or after the swallow but she consistently protrudes her tongue. No signs clinically concerning for aspiration were observed with thin liquids or regular solids. Pt presents with aversive behaviors that are not suspected to be benefited by skilled SLP f/u. Recommend she continue her current diet and SLP will sign  off acutely. SLP Visit Diagnosis: Dysphagia, unspecified (R13.10)    Aspiration Risk  No limitations    Diet Recommendation Regular;Thin liquid    Liquid Administration via: Cup;Straw Medication Administration: Whole meds with liquid Supervision: Staff to assist with self feeding;Full supervision/cueing for compensatory strategies Compensations: Minimize environmental distractions;Slow rate;Small sips/bites Postural Changes:  Seated upright at 90 degrees    Other  Recommendations Oral Care Recommendations: Oral care BID     Assistance Recommended at Discharge    Functional Status Assessment Patient has not had a recent decline in their functional status  Frequency and Duration            Prognosis Prognosis for improved oropharyngeal function: Good      Swallow Study   General HPI: Kelli Smith is an 83 yo female with PMH of A-fib presenting to ED from A-fib clinic 9/8 for cardioversion after being found in A-fib with RVR to the 180s with increased fatigue and limited PO intake. Type of Study: Bedside Swallow Evaluation Previous Swallow Assessment: none in chart Diet Prior to this Study: Regular;Thin liquids (Level 0) Temperature Spikes Noted: No Respiratory Status: Room air History of Recent Intubation: No Behavior/Cognition: Alert;Cooperative Oral Cavity Assessment: Within Functional Limits Oral Care Completed by SLP: No Oral Cavity - Dentition: Dentures, top;Dentures, bottom Vision: Functional for self-feeding Self-Feeding Abilities: Needs assist Patient Positioning: Upright in bed Baseline Vocal Quality: Normal Volitional Cough: Strong Volitional Swallow: Able to elicit    Oral/Motor/Sensory Function Overall Oral Motor/Sensory Function: Within functional limits   Ice Chips Ice chips: Not tested   Thin Liquid Thin Liquid: Within functional limits Presentation: Straw;Cup    Nectar Thick Nectar Thick Liquid: Not tested   Honey Thick Honey Thick Liquid: Not tested   Puree Puree: Not tested   Solid     Solid: Within functional limits Presentation: Kelli Smith, M.A., CCC-SLP Speech Language Pathology, Acute Rehabilitation Services  Secure Chat preferred [phone]  04/04/2024,5:20 PM

## 2024-04-04 NOTE — Progress Notes (Signed)
 Primary Care Physician: No primary care provider on file. Primary Cardiologist: Redell Cave, MD Electrophysiologist: OLE ONEIDA HOLTS, MD  Referring Physician: Dr HOLTS Kelli Smith is a 83 y.o. female with a history of HTN, hyperthyroidism, aortic atherosclerosis, atrial flutter, atrial fibrillation who presents for follow up in the [facility].  The patient underwent DCCV on 02/01/24 but had quick return of afib. She was seen by Dr HOLTS who recommended dofetilide . Patient is on Xarelto  for stroke prevention.    Patient presents today for follow up for atrial fibrillation and dofetilide  loading. Her daughter reports that she feels poorly with extreme fatigue. No presyncope.   Today, she denies symptoms of palpitations, chest pain, shortness of breath, orthopnea, PND, lower extremity edema, dizziness, presyncope, syncope, snoring, daytime somnolence, bleeding, or neurologic sequela. The patient is tolerating medications without difficulties and is otherwise without complaint today.    Atrial Fibrillation Risk Factors:  she does not have symptoms or diagnosis of sleep apnea. she does not have a history of rheumatic fever.   Atrial Fibrillation Management history:  Previous antiarrhythmic drugs: amiodarone  (thyroid ) Previous cardioversions: 2022 x 2, 02/01/24 Previous ablations: none Anticoagulation history: Xarelto    ROS- All systems are reviewed and negative except as per the HPI above.  Past Medical History:  Diagnosis Date   A-fib [facility])    a.) CHA2DS2-VASc = 5 (age x 2, sex, HTN, aortic plaque). b.) s/p DCCV (200J x 2) 11/07/2020. c.) rate/rhythm maintained on oral amiodarone ; chronically anticoagulated using rivaroxaban .   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/2020   B12 deficiency    Basal cell  carcinoma    CAD (coronary artery disease)    CAD (coronary artery disease) 11/22/2020   Cyst of right kidney    DDD (degenerative disc disease), lumbar    Hematuria 04/05/2020   History of chicken pox    History of kidney stones    HTN (hypertension)    Knee pain, bilateral 06/11/2018   Long term current use of anticoagulant    a.) rivaroxaban    Memory loss    Microcytosis    Osteopenia    PAC (premature atrial contraction)    Pain of right heel 04/05/2020   Premature atrial contraction 09/20/2019   Primary osteoarthritis of both knees 09/30/2020   Right shoulder pain 06/11/2018   Thyromegaly    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     Ascorbic Acid  (VITAMIN C  PO) Take 500 mg by mouth daily.     Calcium  Carbonate-Vit D-Min (CALCIUM  600+D3 PLUS MINERALS PO) Take 600 mg by mouth 2 (two) times daily.     methimazole  (TAPAZOLE ) 5 MG tablet Take by mouth. (Patient taking differently: Take 2.5 mg by mouth daily.)     metoprolol  succinate (TOPROL  XL) 25 MG 24 hr tablet Take 1 tablet (25 mg total) by mouth in the morning and at bedtime. 180 tablet 3   Multiple Vitamins-Minerals (MULTIVITAMIN WITH MINERALS) tablet Take 1 tablet by mouth every other day.     XARELTO  20 MG TABS tablet TAKE 1 TABLET BY MOUTH DAILY WITH SUPPER. 30 tablet 5  No current facility-administered medications for this encounter.    Physical Exam: BP 104/80   Pulse (!) 181   Ht 5' 3 (1.6 m)   Wt 52.9 kg   BMI 20.65 kg/m   GEN: Well nourished, well developed in no acute distress CARDIAC: Irregularly irregular rate and rhythm, no murmurs, rubs, gallops RESPIRATORY:  Clear to auscultation without rales, wheezing or rhonchi  ABDOMEN: Soft, non-tender, non-distended EXTREMITIES:  No edema; No deformity   Wt Readings from Last 3  Encounters:  04/04/24 52.9 kg  03/23/24 53 kg  03/16/24 55 kg     EKG today demonstrates  Afib with RVR Vent. rate 181 BPM PR interval * ms QRS duration 70 ms QT/QTcB 244/423 ms   Echo 03/10/24 demonstrated   1. Left ventricular ejection fraction, by estimation, is 55 to 60%. The  left ventricle has normal function. The left ventricle has no regional  wall motion abnormalities. There is mild left ventricular hypertrophy.  Left ventricular diastolic parameters are indeterminate.   2. Right ventricular systolic function is normal. The right ventricular  size is normal. There is normal pulmonary artery systolic pressure. The  estimated right ventricular systolic pressure is 25.4 mmHg.   3. Left atrial size was moderately dilated.   4. A small pericardial effusion is present off the RV free wall and RA.   5. The mitral valve is normal in structure. Mild mitral valve  regurgitation. No evidence of mitral stenosis. Moderate mitral annular  calcification.   6. Tricuspid valve regurgitation is mild to moderate.   7. The aortic valve is normal in structure. Aortic valve regurgitation is  moderate. Aortic valve sclerosis/calcification is present, without any  evidence of aortic stenosis.   8. The inferior vena cava is normal in size with greater than 50%  respiratory variability, suggesting right atrial pressure of 3 mmHg.    CHA2DS2-VASc Score = 5  The patient's score is based upon: CHF History: 0 HTN History: 1 Diabetes History: 0 Stroke History: 0 Vascular Disease History: 1 Age Score: 2 Gender Score: 1       ASSESSMENT AND PLAN: Persistent Atrial Fibrillation (ICD10:  I48.19) The patient's CHA2DS2-VASc score is 5, indicating a 7.2% annual risk of stroke.   Patient presents today for elective admission for dofetilide  loading. Unfortunately, she is in rapid afib with rates in the 180's bpm, feeling poorly. I am concerned about her waiting possibly several hours for a bed. In  discussion with daughter and patient, agreed ED is most appropriate for evaluation. Also discussed with inpatient EP team, can admit from ED for dofetilide  loading.  Continue Xarelto  20 mg daily Continue Toprol  25 mg BID  Secondary Hypercoagulable State (ICD10:  D68.69) The patient is at significant risk for stroke/thromboembolism based upon her CHA2DS2-VASc Score of 5.  Continue Rivaroxaban  (Xarelto ). No bleeding issues.   HTN Stable on current regimen   Patient going to ED in private vehicle, daughter driving her. Declined EMS.     [facility] [address] [place], [place] [address] [phone]

## 2024-04-04 NOTE — ED Triage Notes (Signed)
 Patient sent from a-fib clinic denies chest pain , c/o rapid heart rate EKG brought with patient heart rate was 181

## 2024-04-05 DIAGNOSIS — I4819 Other persistent atrial fibrillation: Secondary | ICD-10-CM | POA: Diagnosis not present

## 2024-04-05 LAB — BASIC METABOLIC PANEL WITH GFR
Anion gap: 13 (ref 5–15)
BUN: 57 mg/dL — ABNORMAL HIGH (ref 8–23)
CO2: 21 mmol/L — ABNORMAL LOW (ref 22–32)
Calcium: 8.8 mg/dL — ABNORMAL LOW (ref 8.9–10.3)
Chloride: 108 mmol/L (ref 98–111)
Creatinine, Ser: 1.11 mg/dL — ABNORMAL HIGH (ref 0.44–1.00)
GFR, Estimated: 49 mL/min — ABNORMAL LOW (ref >60)
Glucose, Bld: 139 mg/dL — ABNORMAL HIGH (ref 70–99)
Potassium: 3.8 mmol/L (ref 3.5–5.1)
Sodium: 142 mmol/L (ref 135–145)

## 2024-04-05 LAB — MAGNESIUM: Magnesium: 2.3 mg/dL (ref 1.7–2.4)

## 2024-04-05 MED ORDER — HEPARIN (PORCINE) 25000 UT/250ML-% IV SOLN
850.0000 [IU]/h | INTRAVENOUS | Status: DC
  Administered 2024-04-05: 750 [IU]/h via INTRAVENOUS
  Administered 2024-04-07 – 2024-04-08 (×2): 850 [IU]/h via INTRAVENOUS
  Filled 2024-04-05 (×3): qty 250

## 2024-04-05 MED ORDER — POTASSIUM CHLORIDE CRYS ER 20 MEQ PO TBCR
20.0000 meq | EXTENDED_RELEASE_TABLET | Freq: Once | ORAL | Status: AC
  Administered 2024-04-05: 20 meq via ORAL
  Filled 2024-04-05: qty 1

## 2024-04-05 MED ORDER — THIAMINE MONONITRATE 100 MG PO TABS
100.0000 mg | ORAL_TABLET | Freq: Every day | ORAL | Status: DC
  Administered 2024-04-05 – 2024-04-09 (×5): 100 mg via ORAL
  Filled 2024-04-05 (×5): qty 1

## 2024-04-05 MED ORDER — AMIODARONE LOAD VIA INFUSION
150.0000 mg | Freq: Once | INTRAVENOUS | Status: AC
  Administered 2024-04-05: 150 mg via INTRAVENOUS
  Filled 2024-04-05: qty 83.34

## 2024-04-05 MED ORDER — DIGOXIN 125 MCG PO TABS
0.0625 mg | ORAL_TABLET | ORAL | Status: DC
  Administered 2024-04-05 – 2024-04-09 (×3): 0.0625 mg via ORAL
  Filled 2024-04-05 (×4): qty 1

## 2024-04-05 MED ORDER — DIGOXIN 125 MCG PO TABS: 0.0625 mg | ORAL_TABLET | Freq: Every day | ORAL | Status: DC

## 2024-04-05 MED ORDER — ENSURE PLUS HIGH PROTEIN PO LIQD
237.0000 mL | Freq: Three times a day (TID) | ORAL | Status: DC
  Administered 2024-04-05 – 2024-04-06 (×4): 237 mL via ORAL

## 2024-04-05 MED ORDER — DEXTROSE IN LACTATED RINGERS 5 % IV SOLN: INTRAVENOUS | Status: AC

## 2024-04-05 NOTE — Progress Notes (Signed)
 Pharmacist, Prentice, notified that heparin  infusion unable to be started at this time. Patient currently only with one IV access which is currently infusing amiodarone . Patient is a difficult stick, and PIV that was in right arm infiltrated. IV team consult placed.

## 2024-04-05 NOTE — Progress Notes (Signed)
 VAST RN spoke to unit RN regarding infusion of amiodarone  through patient's midline. Educated that amiodarone  is a known vesicant and should NOT be infused through a midline. Unit RN verbalized that she was aware and  that pharmacist instructed nurse to run amiodarone  through midline while waiting for PIV placement and then the amiodarone  could be switched to the PIV once placed.

## 2024-04-05 NOTE — Progress Notes (Signed)
  Patient Name: Kelli Smith Date of Encounter: 04/05/2024  Primary Cardiologist: Redell Cave, MD Electrophysiologist: OLE ONEIDA HOLTS, MD  Interval Summary   S/p DCCV x 2 in ER, unsuccessful Unable to initiate tikosyn  d/t elevated lactic acid, hypotension, AKI.  Amiodarone  gtt initiated Patient continues to be asymptomatic of arrhythmia Nursing concerned about PO intake with witnessed gagging, cleared by speech Patient states she eats good  Vital Signs    Vitals:   04/04/24 1811 04/04/24 2041 04/05/24 0041 04/05/24 0500  BP: 94/82  110/81 98/76  Pulse:  (!) 124 (!) 45 (!) 127  Resp: 18  (!) 21   Temp:  97.6 F (36.4 C) (!) 97.4 F (36.3 C) 97.6 F (36.4 C)  TempSrc:  Oral Axillary Axillary  SpO2:  93% 95% 94%  Weight:      Height:        Intake/Output Summary (Last 24 hours) at 04/05/2024 0805 Last data filed at 04/05/2024 0400 Gross per 24 hour  Intake 1708.88 ml  Output --  Net 1708.88 ml   Filed Weights   04/04/24 1422  Weight: 54.5 kg    Physical Exam    GEN- NAD, Alert and oriented, chronically-ill appearing  Lungs- Clear to ausculation bilaterally, normal work of breathing Cardiac- Irregularly irregular rate and rhythm, no murmurs, rubs or gallops GI- soft, NT, ND, + BS Extremities- no clubbing or cyanosis. No edema  Telemetry    AFib 120-150s (personally reviewed)  Hospital Course    Kelli Smith is a 83 y.o. female with a history of HTN, hyperthyroidism, aortic atherosclerosis, atrial flutter, atrial fibrillation admitted for tiksoyn loading.   Assessment & Plan    #) persis AFib #) secondary hypercoag #) hypotension Not AF ablation candidate S/p DCCV x 2 in ER, unsuccessful without sinus beats Previously had mild hyperthyroid with amiodarone  Unable to initiate tikosyn  d/t AKI, elevated lactic acid, hypotension Re-loaded amiodarone , rates somewhat improved Stopped home metop, started low dose 0.625mcg dig every other day  (previously elevated dig on 0.125mcg daily)  #) reduced PO intake #) AKI Appreciate SLP eval, no observed gagging during eval Encouraged PO intake 84ml/hr IVF         For questions or updates, please contact [facility] Please consult [email or web address] for contact info under     Signed, Yuuki Skeens, NP  04/05/2024, 8:05 AM

## 2024-04-05 NOTE — Progress Notes (Signed)
 PHARMACY - ANTICOAGULATION CONSULT NOTE  Pharmacy Consult for heparin  Indication: atrial fibrillation  No Known Allergies  Patient Measurements: Height: 5' 3 (160 cm) Weight: 54.5 kg (120 lb 2.4 oz) IBW/kg (Calculated) : 52.4 HEPARIN  DW (KG): 54.5  Vital Signs: Temp: 97.7 F (36.5 C) (09/09 1425) Temp Source: Axillary (09/09 1425) BP: 111/79 (09/09 1425) Pulse Rate: 124 (09/09 1300)  Labs: Recent Labs    04/04/24 0948 04/04/24 1209 04/04/24 1222 04/04/24 1559 04/05/24 0620  HGB 14.2  --  12.2  --   --   HCT 44.6  --  36.0  --   --   PLT 188  --   --   --   --   CREATININE 1.31*  --  1.20* 1.26* 1.11*  TROPONINIHS 71* 56*  --   --   --     Estimated Creatinine Clearance: 31.8 mL/min (A) (by C-G formula based on SCr of 1.11 mg/dL (H)).   Medical History: Past Medical History:  Diagnosis Date   A-fib [facility])    a.) CHA2DS2-VASc = 5 (age x 2, sex, HTN, aortic plaque). b.) s/p DCCV (200J x 2) 11/07/2020. c.) rate/rhythm maintained on oral amiodarone ; chronically anticoagulated using rivaroxaban .   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/2020   B12 deficiency    Basal cell carcinoma    CAD (coronary artery disease)    CAD (coronary artery disease) 11/22/2020   Cyst of right kidney    DDD (degenerative disc disease), lumbar    Hematuria 04/05/2020   History of chicken pox    History of kidney stones    HTN (hypertension)    Knee pain, bilateral 06/11/2018   Long term current use of anticoagulant    a.) rivaroxaban    Memory loss    Microcytosis    Osteopenia    PAC (premature atrial contraction)    Pain of right heel 04/05/2020   Premature atrial contraction 09/20/2019   Primary osteoarthritis of both knees 09/30/2020   Right shoulder pain 06/11/2018   Thyromegaly    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   04/03/2024   Calcium  Carbonate-Vit D-Min (CALCIUM  600+D3 PLUS MINERALS PO) Take 600 mg by mouth 2 (two) times daily.   04/03/2024   methimazole  (TAPAZOLE ) 5 MG tablet Take by mouth. (Patient taking differently: Take 2.5 mg by mouth daily.)   04/04/2024   metoprolol  succinate (TOPROL  XL) 25 MG 24 hr tablet Take 1 tablet (25 mg total) by mouth in the morning and at bedtime. 180 tablet 3 04/04/2024   Multiple Vitamins-Minerals (MULTIVITAMIN WITH MINERALS) tablet Take 1 tablet by mouth every other day.   04/03/2024   acetaminophen  (TYLENOL ) 650 MG CR tablet Take 1,300 mg by mouth every 8 (eight) hours as needed for pain. (Patient not taking: Reported on 04/04/2024)   Not Taking   Scheduled:   acetaminophen   650 mg Oral Q8H   ascorbic acid   500 mg Oral Daily   calcium -vitamin D   1 tablet Oral Q breakfast   digoxin   0.0625 mg Oral QODAY   feeding supplement  237 mL Oral TID BM   Influenza vac split trivalent PF  0.5  mL Intramuscular Tomorrow-1000   methimazole   2.5 mg Oral Daily   multivitamin with minerals  1 tablet Oral QODAY   sodium chloride  flush  10-40 mL Intracatheter Q12H   sodium chloride  flush  3 mL Intravenous Q12H   thiamine   100 mg Oral Daily    Assessment: 83 yo female with afib and plans for possible PPM and AV ablation.  Xarelto  to be on hold and pharmacy to dose heparin    -last dose of Xarelto  (9/8 5pm) -Hg= 12, plt= 188 -SCr 1.11, CrCl ~ 30  Goal of Therapy:  Heparin  level 0.3-0.7 units/ml aPTT 66-102 seconds Monitor platelets by anticoagulation protocol: Yes   Plan:  -No heparin  bolus do to recent Xarelo -Start heparin  750 units/hr at 5pm -daily heparin  level, aPTT and CBC  Prentice Poisson, PharmD Clinical Pharmacist **Pharmacist phone directory can now be found on [email or web address] ([email or web address]).  Listed under [facility]  Pharmacy.

## 2024-04-05 NOTE — Progress Notes (Signed)
Dear Doctor:  This patient has been identified as a candidate for PICC for the following reason (s): poor veins/poor circulatory system (CHF, COPD, emphysema, diabetes, steroid use, IV drug abuse, etc.) If you agree, please write an order for the indicated device. For any questions contact the Vascular Access Team at [phone] if no answer, please leave a message.  Thank you for supporting the early vascular access assessment program. 

## 2024-04-05 NOTE — Plan of Care (Signed)
  Problem: Education: Goal: Knowledge of General Education information will improve Description: Including pain rating scale, medication(s)/side effects and non-pharmacologic comfort measures 04/05/2024 1950 by Lennette Milder I, RN Outcome: Progressing 04/05/2024 1949 by Lennette Milder I, RN Outcome: Progressing   Problem: Health Behavior/Discharge Planning: Goal: Ability to safely manage health-related needs after discharge will improve 04/05/2024 1950 by Lennette Milder I, RN Outcome: Progressing 04/05/2024 1949 by Lennette Milder I, RN Outcome: Progressing   Problem: Cardiac: Goal: Ability to achieve and maintain adequate cardiopulmonary perfusion will improve 04/05/2024 1950 by Lennette Milder I, RN Outcome: Progressing 04/05/2024 1949 by Lennette Milder I, RN Outcome: Progressing   Problem: Activity: Goal: Ability to tolerate increased activity will improve 04/05/2024 1950 by Lennette Milder I, RN Outcome: Progressing 04/05/2024 1949 by Lennette Milder I, RN Outcome: Progressing   Problem: Education: Goal: Knowledge of disease or condition will improve 04/05/2024 1950 by Lennette Milder I, RN Outcome: Progressing 04/05/2024 1949 by Lennette Milder I, RN Outcome: Progressing   Problem: Skin Integrity: Goal: Risk for impaired skin integrity will decrease 04/05/2024 1950 by Lennette Milder I, RN Outcome: Progressing 04/05/2024 1949 by Lennette Milder I, RN Outcome: Progressing   Problem: Pain Managment: Goal: General experience of comfort will improve and/or be controlled 04/05/2024 1950 by Lennette Milder I, RN Outcome: Progressing 04/05/2024 1949 by Lennette Milder I, RN Outcome: Progressing   Problem: Nutrition: Goal: Adequate nutrition will be maintained 04/05/2024 1950 by Lennette Milder I, RN Outcome: Progressing 04/05/2024 1949 by Lennette Milder I, RN Outcome: Progressing   Problem: Clinical Measurements: Goal: Will remain free from  infection 04/05/2024 1950 by Lennette Milder I, RN Outcome: Progressing 04/05/2024 1949 by Lennette Milder I, RN Outcome: Progressing

## 2024-04-05 NOTE — TOC Initial Note (Signed)
 Transition of Care [facility]) - Initial/Assessment Note    Patient Details  Name: Kelli Smith MRN: [id number] Date of Birth: 13-Jun-1941  [facility] of [facility]) CM/SW Contact:    Sudie Erminio Deems, RN Phone Number: 04/05/2024, 11:52 AM  Clinical Narrative: Patient presented for atrial fibrillation. PTA patient was from home with support of daughter. Patient has DME; cane, shower chair, and bedside commode. Daughter has concerns that patient needs additional assistance in the home. Case Manager discussed home health services- able to arrange [facility] PT and Aide. Daughter asked for [facility] to provide the services. Case Manager discussed disposition plan with NP and orders to follow. PT consult placed in EPIC. Case Manager discussed personal care services for the patient and the cost- daughter unable to afford at this time. Daughter is agreeable to have Case Manager contact the Financial Counselor to see if the patient is eligible for Medicaid. Case Manager will continue to follow for disposition needs as the patient progresses.                  Expected Discharge Plan: Home w Home Health Services Barriers to Discharge: Continued Medical Work up   Patient Goals and CMS Choice Patient states their goals for this hospitalization and ongoing recovery are:: Patient wants to return home with home health services   Choice offered to / list presented to : Adult Children (Daughter works at [employer] and wants to use them)      Expected Discharge Plan and Services In-house Referral: NA Discharge Planning Services: CM Consult Post Acute Care Choice: Home Health Living arrangements for the past 2 months: Apartment                   DME Agency: NA       HH Arranged: PT, Nurse's Aide HH Agency: [facility] Date [facility] Agency Contacted: 04/05/24 Time HH Agency Contacted: 1152 Representative spoke with at [facility] Agency: Darleene  Prior Living Arrangements/Services Living arrangements for the  past 2 months: Apartment Lives with:: Adult Children Patient language and need for interpreter reviewed:: Yes Do you feel safe going back to the place where you live?: Yes      Need for Family Participation in Patient Care: Yes (Comment) Care giver support system in place?: Yes (comment) Current home services: DME (rolling walker, shower chair, and bedside commode.) Criminal Activity/Legal Involvement Pertinent to Current Situation/Hospitalization: No - Comment as needed  Activities of Daily Living   ADL Screening (condition at time of admission) Independently performs ADLs?: No Does the patient have a NEW difficulty with bathing/dressing/toileting/self-feeding that is expected to last >3 days?: Yes (Initiates electronic notice to provider for possible OT consult) Does the patient have a NEW difficulty with getting in/out of bed, walking, or climbing stairs that is expected to last >3 days?: Yes (Initiates electronic notice to provider for possible PT consult) Does the patient have a NEW difficulty with communication that is expected to last >3 days?: No Is the patient deaf or have difficulty hearing?: No Does the patient have difficulty seeing, even when wearing glasses/contacts?: No Does the patient have difficulty concentrating, remembering, or making decisions?: Yes  Permission Sought/Granted Permission sought to share information with : Case Manager, Family Supports       Permission granted to share info w AGENCY: [facility]        Emotional Assessment Appearance:: Appears stated age Attitude/Demeanor/Rapport: Unable to Assess Affect (typically observed): Unable to Assess Orientation: : Oriented to Self, Oriented to  Place Alcohol / Substance Use: Not Applicable Psych Involvement: No (comment)  Admission diagnosis:  Atrial fibrillation (HCC) [I48.91] Atrial fibrillation with RVR (HCC) [I48.91] Patient Active Problem List   Diagnosis Date Noted   Atrial fibrillation (HCC)  04/04/2024   Failure to thrive in adult 03/23/2024   Epigastric pain 03/23/2024   Longstanding persistent atrial fibrillation (HCC) 03/23/2024   Permanent atrial fibrillation (HCC) 03/23/2024   SOB (shortness of breath) 02/01/2024   Tachycardia 01/20/2024   Constipation 07/21/2023   Multinodular thyroid  07/21/2023   Elevated vitamin B12 level 11/22/2022   Appointment canceled by hospital 11/20/2022   Urinary tract infection 10/29/2022   Vitamin D  deficiency 07/13/2022   Cyst of right kidney 11/22/2020   Aortic atherosclerosis (HCC) 11/22/2020   DDD (degenerative disc disease), lumbar 04/05/2020   Lumbar radiculopathy 04/05/2020   Atrial fibrillation with RVR (HCC) 09/08/2019   Basal cell carcinoma 06/20/2019   Osteopenia 12/28/2018   Microcytosis 09/15/2018   Osteoarthritis 06/16/2018   Memory loss 06/11/2018   PCP:  Pcp, No Pharmacy:   [id number] [address], [place] - [address] [place] [address] Phone: [phone] Fax: [phone]     Social Drivers of Health (SDOH) Social History: SDOH Screenings   Food Insecurity: No Food Insecurity (04/04/2024)  Housing: Low Risk  (04/04/2024)  Transportation Needs: No Transportation Needs (04/04/2024)  Utilities: Not At Risk (04/04/2024)  Alcohol Screen: Low Risk  (01/19/2024)  Depression (PHQ2-9): Medium Risk (03/23/2024)  Financial Resource Strain: Patient Declined (01/19/2024)  Physical Activity: Inactive (01/19/2024)  Social Connections: Socially Isolated (04/04/2024)  Stress: No Stress Concern Present (01/19/2024)  Tobacco Use: Low Risk  (04/04/2024)  Health Literacy: Inadequate Health Literacy (08/10/2023)   SDOH Interventions:     Readmission Risk Interventions     No data to display

## 2024-04-05 NOTE — Plan of Care (Signed)
  Problem: Clinical Measurements: Goal: Ability to maintain clinical measurements within normal limits will improve Outcome: Progressing   Problem: Health Behavior/Discharge Planning: Goal: Ability to manage health-related needs will improve Outcome: Progressing   Problem: Education: Goal: Knowledge of General Education information will improve Description: Including pain rating scale, medication(s)/side effects and non-pharmacologic comfort measures Outcome: Progressing   Problem: Pain Managment: Goal: General experience of comfort will improve and/or be controlled Outcome: Progressing   Problem: Safety: Goal: Ability to remain free from injury will improve Outcome: Progressing   Problem: Health Behavior/Discharge Planning: Goal: Ability to safely manage health-related needs after discharge will improve Outcome: Progressing

## 2024-04-05 NOTE — Plan of Care (Signed)

## 2024-04-05 NOTE — Progress Notes (Signed)
 Chaplain responded to AD spiritual consult. As I presented the documents to pt Kelli Smith, I assessed that she was not able to fully understand enough to be able to fill it out and sign at this time. Further, the conversation seemed to add some bit of stress to [name]. Therefore, I explained that we could revisit it later if she would like, and that chaplains remain available for all spiritual and emotional needs. This brought visible relief. Please reach out as further needs arise.

## 2024-04-06 ENCOUNTER — Encounter (HOSPITAL_COMMUNITY)
Admission: EM | Disposition: A | Discharge status: Home / Self Care | Payer: Self-pay | Source: Home / Self Care | Attending: Cardiology

## 2024-04-06 DIAGNOSIS — I4819 Other persistent atrial fibrillation: Secondary | ICD-10-CM | POA: Diagnosis not present

## 2024-04-06 LAB — BASIC METABOLIC PANEL WITH GFR
Anion gap: 11 (ref 5–15)
BUN: 46 mg/dL — ABNORMAL HIGH (ref 8–23)
CO2: 22 mmol/L (ref 22–32)
Calcium: 8.4 mg/dL — ABNORMAL LOW (ref 8.9–10.3)
Chloride: 108 mmol/L (ref 98–111)
Creatinine, Ser: 0.94 mg/dL (ref 0.44–1.00)
GFR, Estimated: >60 mL/min (ref >60)
Glucose, Bld: 99 mg/dL (ref 70–99)
Potassium: 3.4 mmol/L — ABNORMAL LOW (ref 3.5–5.1)
Sodium: 141 mmol/L (ref 135–145)

## 2024-04-06 LAB — MAGNESIUM: Magnesium: 1.9 mg/dL (ref 1.7–2.4)

## 2024-04-06 LAB — CBC
HCT: 35.5 % — ABNORMAL LOW (ref 36.0–46.0)
Hemoglobin: 11.5 g/dL — ABNORMAL LOW (ref 12.0–15.0)
MCH: 23.6 pg — ABNORMAL LOW (ref 26.0–34.0)
MCHC: 32.4 g/dL (ref 30.0–36.0)
MCV: 72.9 fL — ABNORMAL LOW (ref 80.0–100.0)
Platelets: 121 K/uL — ABNORMAL LOW (ref 150–400)
RBC: 4.87 MIL/uL (ref 3.87–5.11)
RDW: 17.6 % — ABNORMAL HIGH (ref 11.5–15.5)
WBC: 9.3 K/uL (ref 4.0–10.5)
nRBC: 0.4 % — ABNORMAL HIGH (ref 0.0–0.2)

## 2024-04-06 LAB — HEPARIN LEVEL (UNFRACTIONATED)
Heparin Unfractionated: 0.61 [IU]/mL (ref 0.30–0.70)
Heparin Unfractionated: 0.65 [IU]/mL (ref 0.30–0.70)

## 2024-04-06 LAB — APTT
aPTT: 42 s — ABNORMAL HIGH (ref 24–36)
aPTT: 83 s — ABNORMAL HIGH (ref 24–36)

## 2024-04-06 SURGERY — CARDIOVERSION (CATH LAB)
Anesthesia: General

## 2024-04-06 MED ORDER — POTASSIUM CHLORIDE 20 MEQ PO PACK: 40.0000 meq | PACK | Freq: Once | ORAL | Status: DC

## 2024-04-06 MED ORDER — POTASSIUM CHLORIDE 20 MEQ PO PACK
40.0000 meq | PACK | Freq: Once | ORAL | Status: AC
  Administered 2024-04-06: 40 meq via ORAL
  Filled 2024-04-06: qty 2

## 2024-04-06 MED ORDER — SODIUM CHLORIDE 0.9 % IV SOLN: INTRAVENOUS | Status: AC

## 2024-04-06 MED ORDER — MAGNESIUM SULFATE 2 GM/50ML IV SOLN
2.0000 g | Freq: Once | INTRAVENOUS | Status: AC
Start: 2024-04-06 — End: 2024-04-07
  Administered 2024-04-06: 2 g via INTRAVENOUS
  Filled 2024-04-06: qty 50

## 2024-04-06 MED ORDER — NEPRO/CARBSTEADY PO LIQD
237.0000 mL | Freq: Three times a day (TID) | ORAL | Status: DC
  Administered 2024-04-06 – 2024-04-09 (×8): 237 mL via ORAL

## 2024-04-06 NOTE — Progress Notes (Signed)
 Initial Nutrition Assessment  DOCUMENTATION CODES:   Severe malnutrition in context of chronic illness  INTERVENTION:  -Liberalized/regular menu, regular texture, thin liquids -Add Nepro TID for concentrated kcal/pro -Add Magic Cup TID -Continue MVI, Thiamine , Vit C, Ca/Vit D, Mg, Potassium per MD   NUTRITION DIAGNOSIS:   Severe Malnutrition related to lethargy/confusion, chronic illness, decreased appetite as evidenced by severe muscle depletion, energy intake < 75% for > or equal to 3 months, severe fat depletion, percent weight loss, edema.  GOAL:   Patient will meet greater than or equal to 90% of their needs   MONITOR:   PO intake, Skin, Supplement acceptance, Labs, Weight trends  REASON FOR ASSESSMENT:   Malnutrition Screening Tool    ASSESSMENT:   Hx persistent atrial fibrillation, atrial flutter, hypertension, hypothyroidism, and aortic atherosclerosis who presented to AF clinic to see Dr. Cindie this morning.  She had recent cardioversion on 02/01/2024 however with return of symptomatic AF with associated generalized malaise and extreme fatigue.  She had attempted DCCV in the emergency department for AF/RVR with rates in the 180s however this did not restore sinus rhythm.  Spoke to pt and daughter in room. Pt denies n/v/c/d or chewing/swallowing difficulties. Last BM 9/10. Pt tells this RD that she eats well, has a good appetite, has not lost any weight. However, pt's daughter states she has had poor PO intake, poor appetite, significant weight loss. Documented meal intake 0-20%. Documented weight loss of 15.6 kg (22.8%) x 3 months, which is severe. NFPE completed (see below), severe protein calorie malnutrition. Pt's daughter states she has been doing her best to get pt to increase PO intake, provides ONS, etc; though, pt resistant. Encouragement given to dtr that she is doing the right things. Pt has poor insight, perhaps poor historian. Pt also was telling her daughter  that she was eating well at baseline, when she was not doing so. Per SLP, pt with no clinically signs concerning of aspiration. Pt hesitant to even try PO intake with SLP, has been gagging with intake. Does constantly protrude her tongue.   Long conversation with pt discussing the importance of adequate kcal/pro intake, need to fuel body, etc. Discussed severe malnutrition and potential impacts of this. Pt's daughter knowledgeable regarding these recommendations, has been trying to get pt to increase intake. Daughter does state the last time the pt was have increased A-fib, flutter that she had a decrease in appetite as well. Hopeful cardioversion today will improve how pt feels/appetite. +Nepro TID to provide concentrated kcal/pro. +MC TID, discussed food fortification. Continue vitamin/mineral supplementation. Pt and daughter deny additional questions/concerns at this time, will continue to monitor, RDN available prn.   Labs Potassium 3.4 BUN 46 Calcium  8.4 H/H 11.5/35.5  Medications  acetaminophen   650 mg Oral Q8H   ascorbic acid   500 mg Oral Daily   calcium -vitamin D   1 tablet Oral Q breakfast   digoxin   0.0625 mg Oral QODAY   feeding supplement (NEPRO CARB STEADY)  237 mL Oral TID BM   Influenza vac split trivalent PF  0.5 mL Intramuscular Tomorrow-1000   methimazole   2.5 mg Oral Daily   multivitamin with minerals  1 tablet Oral QODAY   sodium chloride  flush  10-40 mL Intracatheter Q12H   sodium chloride  flush  3 mL Intravenous Q12H   thiamine   100 mg Oral Daily     NUTRITION - FOCUSED PHYSICAL EXAM:  Flowsheet Row Most Recent Value  Orbital Region Severe depletion  Upper Arm Region Severe  depletion  Thoracic and Lumbar Region Severe depletion  Buccal Region Severe depletion  Temple Region Severe depletion  Clavicle Bone Region Severe depletion  Clavicle and Acromion Bone Region Severe depletion  Scapular Bone Region Severe depletion  Dorsal Hand Severe depletion  Patellar  Region Severe depletion  Anterior Thigh Region Severe depletion  Posterior Calf Region Severe depletion  Edema (RD Assessment) Mild  Hair Reviewed  Eyes Reviewed  Mouth Reviewed  Skin Reviewed  Nails Reviewed    Diet Order:   Diet Order             Diet regular Room service appropriate? Yes; Fluid consistency: Thin  Diet effective now                   EDUCATION NEEDS:   Education needs have been addressed  Skin:  Skin Assessment: Reviewed RN Assessment  Last BM:  9/10  Height:   Ht Readings from Last 1 Encounters:  04/04/24 5' 3 (1.6 m)    Weight:   Wt Readings from Last 1 Encounters:  04/04/24 54.5 kg   BMI:  Body mass index is 21.28 kg/m.  Estimated Nutritional Needs:   Kcal:  1200-1400 kcal  Protein:  65-80 g  Fluid:  >/=1.5L  Akbar Sacra Daml-Budig, RDN, LDN Registered Dietitian Nutritionist RD Inpatient Contact Info in [place]

## 2024-04-06 NOTE — Evaluation (Signed)
 Physical Therapy Evaluation Patient Details Name: Kelli Smith MRN: [id number] DOB: February 07, 1941 Today's Date: 04/06/2024  History of Present Illness  The pt is an 83 yo female presenting 9/8 from afib clinic with afib with RVR. S/p DCCV x2 in ED but persistent afib. PMH includes: afib, aortic atherosclerosis, arthritis, CAD, HTN, memory loss, TKA.  Clinical Impression  Pt is presenting at Min A for bed mobility, Min A for sit to stand and gait with SPC. Pt was able to perform 1 step up with rail and SPC with Min A. Per previous chart pt has good family support at home. Due to pt current functional status, home set up and available assistance at home recommending skilled physical therapy services 3x/week in order to address strength, balance and functional mobility to decrease risk for falls, injury and re-hospitalization.           If plan is discharge home, recommend the following: A little help with walking and/or transfers;Help with stairs or ramp for entrance;Assist for transportation;Supervision due to cognitive status;Assistance with cooking/housework     Equipment Recommendations Rolling walker (2 wheels)     Functional Status Assessment Patient has had a recent decline in their functional status and demonstrates the ability to make significant improvements in function in a reasonable and predictable amount of time.     Precautions / Restrictions Precautions Precautions: Fall Recall of Precautions/Restrictions: Impaired Restrictions Weight Bearing Restrictions Per Provider Order: No      Mobility  Bed Mobility Overal bed mobility: Needs Assistance Bed Mobility: Supine to Sit, Sit to Supine     Supine to sit: Contact guard Sit to supine: Min assist   General bed mobility comments: CGA to get to EOB and Min A for bil LE to get to supine.    Transfers Overall transfer level: Needs assistance Equipment used: Straight cane Transfers: Sit to/from Stand Sit to Stand: Min  assist           General transfer comment: Min A for stabilization with SPC.    Ambulation/Gait Ambulation/Gait assistance: Min assist, Contact guard assist Gait Distance (Feet): 150 Feet Assistive device: Straight cane Gait Pattern/deviations: Step-through pattern, Decreased stride length, Drifts right/left Gait velocity: decreased Gait velocity interpretation: <1.31 ft/sec, indicative of household ambulator   General Gait Details: Very short partial to step through gait pattern with low floor clearance, Drifts R intermittently  Stairs Stairs: Yes Stairs assistance: Min assist Stair Management: One rail Left, Forwards, Backwards, With cane Number of Stairs: 1 General stair comments: Rail L, cane R pt stepped up one step forward at Min A then back at Min A did not progress further due to lines and HR up to 139 bpm     Balance Overall balance assessment: Needs assistance Sitting-balance support: Single extremity supported, Feet supported, Feet unsupported Sitting balance-Leahy Scale: Fair     Standing balance support: Single extremity supported, During functional activity, Reliant on assistive device for balance Standing balance-Leahy Scale: Poor Standing balance comment: Min A for balance         Pertinent Vitals/Pain Pain Assessment Pain Assessment: No/denies pain    Home Living Family/patient expects to be discharged to:: Private residence Living Arrangements: Children;Other relatives Available Help at Discharge: Family;Available 24 hours/day Type of Home: House Home Access: Stairs to enter   Entrance Stairs-Number of Steps: 1 Alternate Level Stairs-Number of Steps: Per previous chart; pt lives in town house with daughter - 15 stairs to get to bedroom but can sleep on first floor Home  Layout: Two level;Able to live on main level with bedroom/bathroom Home Equipment: Cane - single point;BSC/3in1;Shower seat;Rolling Walker (2 wheels) Additional Comments: suction  grab bar in tub. All information was taken from previous chart. pt is a poor historian states she lives with her brother and he works. Reports she has a flight of steps to get into her room.    Prior Function Prior Level of Function : Needs assist;Patient poor historian/Family not available             Mobility Comments: use of SPC ADLs Comments: Daughter assisting with IADL pt mod I for ADL     Extremity/Trunk Assessment   Upper Extremity Assessment Upper Extremity Assessment: Generalized weakness    Lower Extremity Assessment Lower Extremity Assessment: Generalized weakness    Cervical / Trunk Assessment Cervical / Trunk Assessment: Kyphotic  Communication   Communication Communication: Impaired Factors Affecting Communication: Hearing impaired    Cognition Arousal: Alert Behavior During Therapy: WFL for tasks assessed/performed, Flat affect   PT - Cognitive impairments: History of cognitive impairments, Attention, Memory, Awareness, Problem solving, Safety/Judgement, Sequencing                         Following commands: Impaired Following commands impaired: Follows multi-step commands with increased time, Follows multi-step commands inconsistently     Cueing Cueing Techniques: Verbal cues, Tactile cues, Visual cues     General Comments General comments (skin integrity, edema, etc.): HR up to 149 bpm after gait. Difficulty getting good O2 sat reading with good pleth line 91% on room air during gait.        Assessment/Plan    PT Assessment Patient needs continued PT services  PT Problem List Decreased strength;Decreased activity tolerance;Decreased balance;Decreased mobility;Decreased safety awareness       PT Treatment Interventions DME instruction;Balance training;Gait training;Stair training;Functional mobility training;Therapeutic activities;Therapeutic exercise;Patient/family education    PT Goals (Current goals can be found in the Care Plan  section)  Acute Rehab PT Goals PT Goal Formulation: Patient unable to participate in goal setting Time For Goal Achievement: 04/20/24 Potential to Achieve Goals: Fair    Frequency Min 2X/week        AM-PAC PT 6 Clicks Mobility  Outcome Measure Help needed turning from your back to your side while in a flat bed without using bedrails?: A Little Help needed moving from lying on your back to sitting on the side of a flat bed without using bedrails?: A Little Help needed moving to and from a bed to a chair (including a wheelchair)?: A Little Help needed standing up from a chair using your arms (e.g., wheelchair or bedside chair)?: A Little Help needed to walk in hospital room?: A Little Help needed climbing 3-5 steps with a railing? : A Lot 6 Click Score: 17    End of Session Equipment Utilized During Treatment: Gait belt Activity Tolerance: Patient tolerated treatment well;Patient limited by fatigue Patient left: in bed;with call bell/phone within reach;with bed alarm set Nurse Communication: Mobility status PT Visit Diagnosis: Unsteadiness on feet (R26.81);Other abnormalities of gait and mobility (R26.89);Muscle weakness (generalized) (M62.81)    Time: 8559-8494 PT Time Calculation (min) (ACUTE ONLY): 25 min   Charges:   PT Evaluation $PT Eval Low Complexity: 1 Low PT Treatments $Therapeutic Activity: 8-22 mins PT General Charges $$ ACUTE PT VISIT: 1 Visit         Dorothyann Maier, DPT, CLT  Acute Rehabilitation Services Office: [phone] (Secure chat preferred)  Dorothyann VEAR Maier 04/06/2024, 3:23 PM

## 2024-04-06 NOTE — Progress Notes (Signed)
 PHARMACY - ANTICOAGULATION CONSULT NOTE  Pharmacy Consult for heparin  Indication: atrial fibrillation  No Known Allergies  Patient Measurements: Height: 5' 3 (160 cm) Weight: 54.5 kg (120 lb 2.4 oz) IBW/kg (Calculated) : 52.4 HEPARIN  DW (KG): 54.5  Vital Signs: Temp: 97.8 F (36.6 C) (09/10 1705) Temp Source: Oral (09/10 1705) BP: 92/68 (09/10 1705) Pulse Rate: 104 (09/10 1705)  Labs: Recent Labs    04/04/24 0948 04/04/24 1209 04/04/24 1222 04/04/24 1559 04/05/24 0620 04/06/24 0433 04/06/24 1547  HGB 14.2  --  12.2  --   --  11.5*  --   HCT 44.6  --  36.0  --   --  35.5*  --   PLT 188  --   --   --   --  121*  --   APTT  --   --   --   --   --  42* 83*  HEPARINUNFRC  --   --   --   --   --  0.61 0.65  CREATININE 1.31*  --  1.20* 1.26* 1.11* 0.94  --   TROPONINIHS 71* 56*  --   --   --   --   --     Estimated Creatinine Clearance: 37.5 mL/min (by C-G formula based on SCr of 0.94 mg/dL).   Medical History: Past Medical History:  Diagnosis Date   A-fib [facility])    a.) CHA2DS2-VASc = 5 (age x 2, sex, HTN, aortic plaque). b.) s/p DCCV (200J x 2) 11/07/2020. c.) rate/rhythm maintained on oral amiodarone ; chronically anticoagulated using rivaroxaban .   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/2020   B12 deficiency    Basal cell carcinoma    CAD (coronary artery disease)    CAD (coronary artery disease) 11/22/2020   Cyst of right kidney    DDD (degenerative disc disease), lumbar    Hematuria 04/05/2020   History of chicken pox    History of kidney stones    HTN (hypertension)    Knee pain, bilateral 06/11/2018   Long term current use of anticoagulant    a.) rivaroxaban    Memory loss    Microcytosis    Osteopenia    PAC (premature atrial contraction)    Pain of right heel 04/05/2020   Premature atrial contraction 09/20/2019    Primary osteoarthritis of both knees 09/30/2020   Right shoulder pain 06/11/2018   Thyromegaly    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    Medications:  Medications Prior to Admission  Medication Sig Dispense Refill Last Dose/Taking   Ascorbic Acid  (VITAMIN C  PO) Take 500 mg by mouth daily.   04/03/2024   Calcium  Carbonate-Vit D-Min (CALCIUM  600+D3 PLUS MINERALS PO) Take 600 mg by mouth 2 (two) times daily.   04/03/2024   methimazole  (TAPAZOLE ) 5 MG tablet Take by mouth. (Patient taking differently: Take 2.5 mg by mouth daily.)   04/04/2024   metoprolol  succinate (TOPROL  XL) 25 MG 24 hr tablet Take 1 tablet (25 mg total) by mouth in the morning and at bedtime. 180 tablet 3 04/04/2024   Multiple Vitamins-Minerals (MULTIVITAMIN WITH MINERALS) tablet Take 1 tablet by mouth every other day.   04/03/2024   acetaminophen  (TYLENOL ) 650 MG CR tablet Take 1,300 mg by mouth every 8 (eight) hours as needed for pain. (Patient not taking: Reported on  04/04/2024)   Not Taking   Scheduled:   acetaminophen   650 mg Oral Q8H   ascorbic acid   500 mg Oral Daily   calcium -vitamin D   1 tablet Oral Q breakfast   digoxin   0.0625 mg Oral QODAY   feeding supplement (NEPRO CARB STEADY)  237 mL Oral TID BM   Influenza vac split trivalent PF  0.5 mL Intramuscular Tomorrow-1000   methimazole   2.5 mg Oral Daily   multivitamin with minerals  1 tablet Oral QODAY   sodium chloride  flush  10-40 mL Intracatheter Q12H   sodium chloride  flush  3 mL Intravenous Q12H   thiamine   100 mg Oral Daily    Assessment: 83 yo female with afib and plans for possible PPM and AV ablation.  Xarelto  to be on hold and pharmacy to dose heparin  (last dose of Xarelto  was 9/8 at 5pm)  -heparin  level= 0.65 and aPTT= 83  Goal of Therapy:  Heparin  level 0.3-0.7 units/ml aPTT 66-102 seconds Monitor platelets by anticoagulation protocol: Yes   Plan:  -Continue heparin  at 850 units/hr -Daily heparin  level, aPTT and CBC  Prentice Poisson,  PharmD Clinical Pharmacist **Pharmacist phone directory can now be found on [email or web address] ([email or web address]).  Listed under [facility].

## 2024-04-06 NOTE — Plan of Care (Signed)

## 2024-04-06 NOTE — Progress Notes (Signed)
 PHARMACY - ANTICOAGULATION CONSULT NOTE  Pharmacy Consult for heparin  Indication: atrial fibrillation  No Known Allergies  Patient Measurements: Height: 5' 3 (160 cm) Weight: 54.5 kg (120 lb 2.4 oz) IBW/kg (Calculated) : 52.4 HEPARIN  DW (KG): 54.5  Vital Signs: Temp: 97.8 F (36.6 C) (09/10 0335) Temp Source: Oral (09/10 0335) BP: 109/86 (09/10 0335) Pulse Rate: 135 (09/10 0405)  Labs: Recent Labs    04/04/24 0948 04/04/24 1209 04/04/24 1222 04/04/24 1559 04/05/24 0620 04/06/24 0433  HGB 14.2  --  12.2  --   --  11.5*  HCT 44.6  --  36.0  --   --  35.5*  PLT 188  --   --   --   --  121*  APTT  --   --   --   --   --  42*  HEPARINUNFRC  --   --   --   --   --  0.61  CREATININE 1.31*  --  1.20* 1.26* 1.11* 0.94  TROPONINIHS 71* 56*  --   --   --   --     Estimated Creatinine Clearance: 37.5 mL/min (by C-G formula based on SCr of 0.94 mg/dL).   Medical History: Past Medical History:  Diagnosis Date   A-fib [facility])    a.) CHA2DS2-VASc = 5 (age x 2, sex, HTN, aortic plaque). b.) s/p DCCV (200J x 2) 11/07/2020. c.) rate/rhythm maintained on oral amiodarone ; chronically anticoagulated using rivaroxaban .   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/2020   B12 deficiency    Basal cell carcinoma    CAD (coronary artery disease)    CAD (coronary artery disease) 11/22/2020   Cyst of right kidney    DDD (degenerative disc disease), lumbar    Hematuria 04/05/2020   History of chicken pox    History of kidney stones    HTN (hypertension)    Knee pain, bilateral 06/11/2018   Long term current use of anticoagulant    a.) rivaroxaban    Memory loss    Microcytosis    Osteopenia    PAC (premature atrial contraction)    Pain of right heel 04/05/2020   Premature atrial contraction 09/20/2019   Primary osteoarthritis of both knees 09/30/2020    Right shoulder pain 06/11/2018   Thyromegaly    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    Medications:  Medications Prior to Admission  Medication Sig Dispense Refill Last Dose/Taking   Ascorbic Acid  (VITAMIN C  PO) Take 500 mg by mouth daily.   04/03/2024   Calcium  Carbonate-Vit D-Min (CALCIUM  600+D3 PLUS MINERALS PO) Take 600 mg by mouth 2 (two) times daily.   04/03/2024   methimazole  (TAPAZOLE ) 5 MG tablet Take by mouth. (Patient taking differently: Take 2.5 mg by mouth daily.)   04/04/2024   metoprolol  succinate (TOPROL  XL) 25 MG 24 hr tablet Take 1 tablet (25 mg total) by mouth in the morning and at bedtime. 180 tablet 3 04/04/2024   Multiple Vitamins-Minerals (MULTIVITAMIN WITH MINERALS) tablet Take 1 tablet by mouth every other day.   04/03/2024   acetaminophen  (TYLENOL ) 650 MG CR tablet Take 1,300 mg by mouth every 8 (eight) hours as needed for pain. (Patient not taking: Reported on 04/04/2024)   Not Taking   Scheduled:   acetaminophen   650 mg Oral Q8H   ascorbic  acid  500 mg Oral Daily   calcium -vitamin D   1 tablet Oral Q breakfast   digoxin   0.0625 mg Oral QODAY   feeding supplement  237 mL Oral TID BM   Influenza vac split trivalent PF  0.5 mL Intramuscular Tomorrow-1000   methimazole   2.5 mg Oral Daily   multivitamin with minerals  1 tablet Oral QODAY   sodium chloride  flush  10-40 mL Intracatheter Q12H   sodium chloride  flush  3 mL Intravenous Q12H   thiamine   100 mg Oral Daily    Assessment: 83 yo female with afib and plans for possible PPM and AV ablation.  Xarelto  to be on hold and pharmacy to dose heparin    -last dose of Xarelto  (9/8 5pm) -Hg= 12, plt= 188 -SCr 1.11, CrCl ~ 30  9/10 AM update:  aPTT sub-therapeutic   Goal of Therapy:  Heparin  level 0.3-0.7 units/ml aPTT 66-102 seconds Monitor platelets by anticoagulation protocol: Yes   Plan:  Inc heparin  to 850 units/hr Heparin  level and aPTT in 8 hours  Lynwood Mckusick, PharmD, BCPS Clinical  Pharmacist Phone: [phone]

## 2024-04-06 NOTE — Progress Notes (Signed)
  Patient Name: Kelli Smith Date of Encounter: 04/06/2024  Primary Cardiologist: Kelli Cave, MD Electrophysiologist: Kelli ONEIDA HOLTS, MD  Interval Summary   NAEON Having periods of sinus rhythm on tele She denies chest pain, chest pressure, palpitations.   Vital Signs    Vitals:   04/05/24 2335 04/05/24 2340 04/06/24 0335 04/06/24 0405  BP: (!) 123/90  109/86   Pulse: (!) 106 (!) 104 (!) 134 (!) 135  Resp: (!) 22 17 13 19   Temp: 97.7 F (36.5 C)  97.8 F (36.6 C)   TempSrc: Oral  Oral   SpO2: 99% 100% 99% 99%  Weight:      Height:        Intake/Output Summary (Last 24 hours) at 04/06/2024 0912 Last data filed at 04/06/2024 0700 Gross per 24 hour  Intake 1227.28 ml  Output --  Net 1227.28 ml   Filed Weights   04/04/24 1422  Weight: 54.5 kg    Physical Exam    GEN- NAD, Alert and oriented, chronically-ill appearing  Lungs- Clear to ausculation bilaterally, normal work of breathing Cardiac- Irregularly irregular rate and rhythm, no murmurs, rubs or gallops GI- soft, NT, ND, + BS Extremities- no clubbing or cyanosis. No edema  Telemetry    AFib 100-120s (personally reviewed) Episodes of Sinus in 80-90s  Hospital Course    Kelli Smith is a 83 y.o. female with a history of HTN, hyperthyroidism, aortic atherosclerosis, atrial flutter, atrial fibrillation; originally admitted for tiksoyn loading but had AKI, lactic acid, hypotension.  Restarted IV amiodarone   Assessment & Plan    #) persis AFib #) secondary hypercoag #) hypotension Not AF ablation candidate S/p DCCV x 2 in ER, unsuccessful without sinus beats Previously had mild hyperthyroid with amiodarone  IV amiodarone  continues, has overall improvement in ventricular rates in AFib and some periods of sinus rhythm Continue IV hep in case needs PPM/AVN, holding home xarelto  Continue 0.0625 dig every other day   #) reduced PO intake #) AKI - resolved Appreciate SLP eval, no observed gagging  during eval Encouraged PO intake 74ml/hr IVF   #) deconditioning PT eval      For questions or updates, please contact [facility] Please consult [email or web address] for contact info under     Signed, Kelli Poland, NP  04/06/2024, 9:12 AM

## 2024-04-07 DIAGNOSIS — I4819 Other persistent atrial fibrillation: Secondary | ICD-10-CM | POA: Diagnosis not present

## 2024-04-07 DIAGNOSIS — E43 Unspecified severe protein-calorie malnutrition: Secondary | ICD-10-CM

## 2024-04-07 LAB — BASIC METABOLIC PANEL WITH GFR
Anion gap: 9 (ref 5–15)
BUN: 26 mg/dL — ABNORMAL HIGH (ref 8–23)
CO2: 24 mmol/L (ref 22–32)
Calcium: 8 mg/dL — ABNORMAL LOW (ref 8.9–10.3)
Chloride: 104 mmol/L (ref 98–111)
Creatinine, Ser: 0.79 mg/dL (ref 0.44–1.00)
GFR, Estimated: >60 mL/min (ref >60)
Glucose, Bld: 90 mg/dL (ref 70–99)
Potassium: 3.2 mmol/L — ABNORMAL LOW (ref 3.5–5.1)
Sodium: 137 mmol/L (ref 135–145)

## 2024-04-07 LAB — CBC
HCT: 34.5 % — ABNORMAL LOW (ref 36.0–46.0)
Hemoglobin: 11.2 g/dL — ABNORMAL LOW (ref 12.0–15.0)
MCH: 23.5 pg — ABNORMAL LOW (ref 26.0–34.0)
MCHC: 32.5 g/dL (ref 30.0–36.0)
MCV: 72.5 fL — ABNORMAL LOW (ref 80.0–100.0)
Platelets: 113 K/uL — ABNORMAL LOW (ref 150–400)
RBC: 4.76 MIL/uL (ref 3.87–5.11)
RDW: 17.4 % — ABNORMAL HIGH (ref 11.5–15.5)
WBC: 8.1 K/uL (ref 4.0–10.5)
nRBC: 0.2 % (ref 0.0–0.2)

## 2024-04-07 LAB — VITAMIN B12: Vitamin B-12: 828 pg/mL (ref 180–914)

## 2024-04-07 LAB — VITAMIN D 25 HYDROXY (VIT D DEFICIENCY, FRACTURES): Vit D, 25-Hydroxy: 88.54 ng/mL (ref 30–100)

## 2024-04-07 LAB — APTT: aPTT: 93 s — ABNORMAL HIGH (ref 24–36)

## 2024-04-07 LAB — HEPARIN LEVEL (UNFRACTIONATED): Heparin Unfractionated: 0.53 [IU]/mL (ref 0.30–0.70)

## 2024-04-07 LAB — MAGNESIUM: Magnesium: 1.8 mg/dL (ref 1.7–2.4)

## 2024-04-07 LAB — GLUCOSE, CAPILLARY: Glucose-Capillary: 125 mg/dL — ABNORMAL HIGH (ref 70–99)

## 2024-04-07 MED ORDER — MAGNESIUM SULFATE 2 GM/50ML IV SOLN
2.0000 g | Freq: Once | INTRAVENOUS | Status: AC
Start: 2024-04-07 — End: 2024-04-07
  Administered 2024-04-07: 2 g via INTRAVENOUS
  Filled 2024-04-07 (×2): qty 50

## 2024-04-07 MED ORDER — POTASSIUM CHLORIDE 20 MEQ PO PACK
40.0000 meq | PACK | ORAL | Status: AC
  Administered 2024-04-07 (×2): 40 meq via ORAL
  Filled 2024-04-07 (×2): qty 2

## 2024-04-07 NOTE — Progress Notes (Signed)
  Patient Name: Serenna Deroy Date of Encounter: 04/07/2024  Primary Cardiologist: Redell Cave, MD Electrophysiologist: OLE ONEIDA HOLTS, MD  Interval Summary   NAEON Afib rates overall improved, continues to have elevated rates with activity She remains asymptomatic, no complaints this AM  Vital Signs    Vitals:   04/06/24 1947 04/07/24 0025 04/07/24 0440 04/07/24 0751  BP: 114/76 96/65 104/63 122/86  Pulse: (!) 108 93 (!) 117 (!) 117  Resp: 20  20 (!) 23  Temp: 97.9 F (36.6 C) 98.5 F (36.9 C) 97.7 F (36.5 C) 98.4 F (36.9 C)  TempSrc: Oral Oral Oral Oral  SpO2: 95%  94% 98%  Weight:      Height:        Intake/Output Summary (Last 24 hours) at 04/07/2024 0759 Last data filed at 04/06/2024 1500 Gross per 24 hour  Intake 365.08 ml  Output --  Net 365.08 ml   Filed Weights   04/04/24 1422  Weight: 54.5 kg    Physical Exam    GEN- NAD, Alert and oriented, chronically-ill appearing  Lungs- Clear to ausculation bilaterally, normal work of breathing Cardiac- Irregularly irregular rate and rhythm, no murmurs, rubs or gallops GI- soft, NT, ND, + BS Extremities- no clubbing or cyanosis. No edema  Telemetry    AFib 90-100s, rarely 130-140s (personally reviewed)  Hospital Course    Aricela Karren is a 83 y.o. female with a history of HTN, hyperthyroidism, aortic atherosclerosis, atrial flutter, atrial fibrillation; originally admitted for tiksoyn loading but had AKI, lactic acid, hypotension.  Restarted IV amiodarone   Assessment & Plan    #) persis AFib #) secondary hypercoag #) hypotension Not AF ablation candidate S/p DCCV x 2 in ER, unsuccessful without sinus beats Previously had mild hyperthyroid with amiodarone  IV amiodarone  continues, will reduce to 30mg /hr.  Continue IV hep in case needs PPM/AVN, holding home xarelto  Continue 0.0625 dig every other day   #) reduced PO intake #) severe protein-calorie malnutrition Appreciate SLP eval, no  observed gagging during eval Appreciate nutrition consult Encouraged PO intake  #) deconditioning PT eval   #) AKI - resolved        For questions or updates, please contact [facility] Please consult [email or web address] for contact info under     Signed, Braya Habermehl, NP  04/07/2024, 7:59 AM

## 2024-04-07 NOTE — Progress Notes (Signed)
 PHARMACY - ANTICOAGULATION CONSULT NOTE  Pharmacy Consult for heparin  Indication: atrial fibrillation  No Known Allergies  Patient Measurements: Height: 5' 3 (160 cm) Weight: 54.5 kg (120 lb 2.4 oz) IBW/kg (Calculated) : 52.4 HEPARIN  DW (KG): 54.5  Vital Signs: Temp: 98.4 F (36.9 C) (09/11 0751) Temp Source: Oral (09/11 0751) BP: 122/86 (09/11 0751) Pulse Rate: 117 (09/11 0751)  Labs: Recent Labs    04/04/24 0948 04/04/24 1209 04/04/24 1222 04/04/24 1559 04/05/24 0620 04/06/24 0433 04/06/24 1547 04/07/24 0659  HGB 14.2  --  12.2  --   --  11.5*  --  11.2*  HCT 44.6  --  36.0  --   --  35.5*  --  34.5*  PLT 188  --   --   --   --  121*  --  113*  APTT  --   --   --   --   --  42* 83* 93*  HEPARINUNFRC  --   --   --   --   --  0.61 0.65 0.53  CREATININE 1.31*  --  1.20*   < > 1.11* 0.94  --  0.79  TROPONINIHS 71* 56*  --   --   --   --   --   --    < > = values in this interval not displayed.    Estimated Creatinine Clearance: 44.1 mL/min (by C-G formula based on SCr of 0.79 mg/dL).   Medical History: Past Medical History:  Diagnosis Date   A-fib [facility])    a.) CHA2DS2-VASc = 5 (age x 2, sex, HTN, aortic plaque). b.) s/p DCCV (200J x 2) 11/07/2020. c.) rate/rhythm maintained on oral amiodarone ; chronically anticoagulated using rivaroxaban .   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/2020   B12 deficiency    Basal cell carcinoma    CAD (coronary artery disease)    CAD (coronary artery disease) 11/22/2020   Cyst of right kidney    DDD (degenerative disc disease), lumbar    Hematuria 04/05/2020   History of chicken pox    History of kidney stones    HTN (hypertension)    Knee pain, bilateral 06/11/2018   Long term current use of anticoagulant    a.) rivaroxaban    Memory loss    Microcytosis    Osteopenia    PAC (premature  atrial contraction)    Pain of right heel 04/05/2020   Premature atrial contraction 09/20/2019   Primary osteoarthritis of both knees 09/30/2020   Right shoulder pain 06/11/2018   Thyromegaly    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    Medications:  Medications Prior to Admission  Medication Sig Dispense Refill Last Dose/Taking   Ascorbic Acid  (VITAMIN C  PO) Take 500 mg by mouth daily.   04/03/2024   Calcium  Carbonate-Vit D-Min (CALCIUM  600+D3 PLUS MINERALS PO) Take 600 mg by mouth 2 (two) times daily.   04/03/2024   methimazole  (TAPAZOLE ) 5 MG tablet Take by mouth. (Patient taking differently: Take 2.5 mg by mouth daily.)   04/04/2024   metoprolol  succinate (TOPROL  XL) 25 MG 24 hr tablet Take 1 tablet (25 mg total) by mouth in the morning and at bedtime. 180 tablet 3 04/04/2024   Multiple Vitamins-Minerals (MULTIVITAMIN WITH MINERALS) tablet Take 1 tablet by mouth every other day.   04/03/2024  acetaminophen  (TYLENOL ) 650 MG CR tablet Take 1,300 mg by mouth every 8 (eight) hours as needed for pain. (Patient not taking: Reported on 04/04/2024)   Not Taking   Scheduled:   acetaminophen   650 mg Oral Q8H   ascorbic acid   500 mg Oral Daily   calcium -vitamin D   1 tablet Oral Q breakfast   digoxin   0.0625 mg Oral QODAY   feeding supplement (NEPRO CARB STEADY)  237 mL Oral TID BM   Influenza vac split trivalent PF  0.5 mL Intramuscular Tomorrow-1000   methimazole   2.5 mg Oral Daily   multivitamin with minerals  1 tablet Oral QODAY   sodium chloride  flush  10-40 mL Intracatheter Q12H   sodium chloride  flush  3 mL Intravenous Q12H   thiamine   100 mg Oral Daily    Assessment: 83 yo female with afib and plans for possible PPM and AV ablation.  Xarelto  to be on hold and pharmacy to dose heparin  (last dose of Xarelto  was 9/8 at 5pm)  Heparin  level came back therapeutic at 0.53, aPTT also came back therapeutic at 93, on 850 units/hr - will continue to just monitor heparin  level given correlating. Hgb  11.2, plt 113. No s/sx of bleeding or infusion issues.   Goal of Therapy:  Heparin  level 0.3-0.7 units/ml aPTT 66-102 seconds Monitor platelets by anticoagulation protocol: Yes   Plan:  -Continue heparin  at 850 units/hr -Daily heparin  level, CBC, and for s/sx of bleeding   Thank you for allowing pharmacy to participate in this patient's care,  Suzen Sour, PharmD, BCCCP Clinical Pharmacist  Phone: [phone] 04/07/2024 8:49 AM  Please check AMION for all [facility] Pharmacy phone numbers After 10:00 PM, call Main Pharmacy [phone]

## 2024-04-08 ENCOUNTER — Inpatient Hospital Stay (HOSPITAL_COMMUNITY)

## 2024-04-08 DIAGNOSIS — E0590 Thyrotoxicosis, unspecified without thyrotoxic crisis or storm: Secondary | ICD-10-CM

## 2024-04-08 DIAGNOSIS — I4811 Longstanding persistent atrial fibrillation: Secondary | ICD-10-CM | POA: Diagnosis not present

## 2024-04-08 DIAGNOSIS — E43 Unspecified severe protein-calorie malnutrition: Secondary | ICD-10-CM

## 2024-04-08 DIAGNOSIS — I4819 Other persistent atrial fibrillation: Secondary | ICD-10-CM | POA: Diagnosis not present

## 2024-04-08 LAB — HEPARIN LEVEL (UNFRACTIONATED): Heparin Unfractionated: 0.39 [IU]/mL (ref 0.30–0.70)

## 2024-04-08 LAB — BASIC METABOLIC PANEL WITH GFR
Anion gap: 12 (ref 5–15)
BUN: 18 mg/dL (ref 8–23)
CO2: 16 mmol/L — ABNORMAL LOW (ref 22–32)
Calcium: 7.9 mg/dL — ABNORMAL LOW (ref 8.9–10.3)
Chloride: 107 mmol/L (ref 98–111)
Creatinine, Ser: 0.9 mg/dL (ref 0.44–1.00)
GFR, Estimated: >60 mL/min (ref >60)
Glucose, Bld: 103 mg/dL — ABNORMAL HIGH (ref 70–99)
Potassium: 3.9 mmol/L (ref 3.5–5.1)
Sodium: 135 mmol/L (ref 135–145)

## 2024-04-08 LAB — T4, FREE: Free T4: 2.02 ng/dL — ABNORMAL HIGH (ref 0.61–1.12)

## 2024-04-08 LAB — CBC
HCT: 37.2 % (ref 36.0–46.0)
Hemoglobin: 12.3 g/dL (ref 12.0–15.0)
MCH: 23.7 pg — ABNORMAL LOW (ref 26.0–34.0)
MCHC: 33.1 g/dL (ref 30.0–36.0)
MCV: 71.8 fL — ABNORMAL LOW (ref 80.0–100.0)
Platelets: 127 K/uL — ABNORMAL LOW (ref 150–400)
RBC: 5.18 MIL/uL — ABNORMAL HIGH (ref 3.87–5.11)
RDW: 18.4 % — ABNORMAL HIGH (ref 11.5–15.5)
WBC: 9.8 K/uL (ref 4.0–10.5)
nRBC: 0.2 % (ref 0.0–0.2)

## 2024-04-08 LAB — TSH: TSH: 3.108 u[IU]/mL (ref 0.350–4.500)

## 2024-04-08 LAB — MAGNESIUM: Magnesium: 2.1 mg/dL (ref 1.7–2.4)

## 2024-04-08 LAB — APTT: aPTT: 74 s — ABNORMAL HIGH (ref 24–36)

## 2024-04-08 MED ORDER — AMIODARONE HCL 200 MG PO TABS
400.0000 mg | ORAL_TABLET | Freq: Two times a day (BID) | ORAL | Status: DC
  Administered 2024-04-08 – 2024-04-09 (×3): 400 mg via ORAL
  Filled 2024-04-08 (×3): qty 2

## 2024-04-08 MED ORDER — METHIMAZOLE 5 MG PO TABS
5.0000 mg | ORAL_TABLET | Freq: Every day | ORAL | Status: DC
Start: 2024-04-09 — End: 2024-04-09
  Administered 2024-04-09: 5 mg via ORAL
  Filled 2024-04-08: qty 1

## 2024-04-08 MED ORDER — METOPROLOL TARTRATE 25 MG PO TABS
25.0000 mg | ORAL_TABLET | Freq: Two times a day (BID) | ORAL | Status: DC
  Administered 2024-04-08 – 2024-04-09 (×3): 25 mg via ORAL
  Filled 2024-04-08 (×3): qty 1

## 2024-04-08 NOTE — Consult Note (Signed)
 Initial Consultation Note   Patient: Kelli Smith FMW:[id number] DOB: [birth year] PCP: Pcp, No DOA: 04/04/2024 DOS: the patient was seen and examined on 04/08/2024 Primary service: Cindie Ole DASEN, MD  Referring provider: Beecher, NP Reason for consult: Hyperthyroidism  Assessment/Plan: Assessment and Plan:  Persistent atrial fibrillation Patient presents with persistent atrial fibrillation not a candidate for ablation.  Status post cardioversion x 2 in the ER without success.  Noted to have limited medication options at this point and a poor candidate for ablation and pacemaker placement.  Patient had been started on amiodarone  drip and recently transition to p.o. in addition to metoprolol . - Continue amiodarone  and metoprolol  - Per cardiology  Hyperthyroidism History of thyroid  nodule Patient noted to have free T4- 2.02 with TSH- 3.108.  She was started back on methimazole  at 2.5 mg daily after labs back in June of this year noted free T4 elevated at 1.26.  Patient with prior history of having 3 thyroid  nodules which were greater than 1 cm for which she underwent fine-needle aspiration back in 2022.   - Check thyroid  ultrasound - Increase methimazole  to 5 mg - The plan would be to continue outpatient follow-up with [facility] endocrinology  Weight loss Protein calorie malnutrition Acute. Patient totally lost 24 pounds since June.  Similar presentation and found to have thyroid  nodules back in 2022. Thought likely secondary to hyperthyroidism - Continue with protein shakes in between meals  TRH will continue to follow the patient.  HPI: Kelli Smith is a 83 y.o. female with past medical history of hypertension, hyperthyroidism, aortic atherosclerosis, atrial flutter/fibrillation with RVR status post cardioversion x 2 in the ED with persistent atrial fibrillation and reports of weight loss.  She has been experiencing atrial fibrillation and has undergone multiple treatments without  success. Previously, she was on amiodarone , which contributed to hyperthyroidism.  Her heart medication was switched to metoprolol , which she has been taking once a day for the last three years.  Daughter notes that she had been able to be weaned off the amiodarone  as well as methimazole  previously.  She has lost approximately 24 pounds since June. This weight loss is similar to a previous episode in 2022 when she lost 20 pounds. At that time, a complete workup revealed thyroid  nodules and atrial fibrillation.  She was initially treated with methimazole , which was later weaned off. However, patient was noted to have abnormal thyroid  values, she was restarted on methimazole  at a dose of 2.5 mg daily after labs earlier this year had noted elevated levels.  Review of records note that labs from 6/25 noted free T4 to be elevated at 1.26. She was scheduled for repeat labs and an ultrasound in the outpatient setting.  She follows at [facility] endocrinology clinic and has a follow-up appointment scheduled for Tuesday.  Hospital labs noted free T4 to be 2.02 TSH 3.108.  TRH consulted to evaluate for adjustment of medical management for hyperthyroidism prior to discharge.  Review of Systems: As mentioned in the history of present illness. All other systems reviewed and are negative. Past Medical History:  Diagnosis Date   A-fib [facility])    a.) CHA2DS2-VASc = 5 (age x 2, sex, HTN, aortic plaque). b.) s/p DCCV (200J x 2) 11/07/2020. c.) rate/rhythm maintained on oral amiodarone ; chronically anticoagulated using rivaroxaban .   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/2020   B12 deficiency    Basal cell carcinoma    CAD (coronary artery disease)    CAD (coronary artery disease) 11/22/2020   Cyst of right kidney    DDD (degenerative disc disease), lumbar    Hematuria 04/05/2020    History of chicken pox    History of kidney stones    HTN (hypertension)    Knee pain, bilateral 06/11/2018   Long term current use of anticoagulant    a.) rivaroxaban    Memory loss    Microcytosis    Osteopenia    PAC (premature atrial contraction)    Pain of right heel 04/05/2020   Premature atrial contraction 09/20/2019   Primary osteoarthritis of both knees 09/30/2020   Right shoulder pain 06/11/2018   Thyromegaly    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   Past Surgical History:  Procedure Laterality Date   BUNIONECTOMY WITH HAMMERTOE RECONSTRUCTION  2009   CARDIOVERSION N/A 11/07/2020   Procedure: CARDIOVERSION (200J x 2); Location: [facility]; Surgeon: Redell Cave, MD   CARDIOVERSION N/A 02/01/2024   Procedure: CARDIOVERSION;  Surgeon: Cave Redell, MD;  Location: [facility] ORS;  Service: Cardiovascular;  Laterality: N/A;   CATARACT EXTRACTION Bilateral    2013/2014   CESAREAN SECTION N/A 1978   KNEE ARTHROPLASTY Left 12/12/2020   Procedure: COMPUTER ASSISTED TOTAL KNEE ARTHROPLASTY;  Surgeon: Mardee Lynwood SQUIBB, MD;  Location: [facility] ORS;  Service: Orthopedics;  Laterality: Left;   KNEE ARTHROPLASTY Right 10/28/2021   Procedure: COMPUTER ASSISTED TOTAL KNEE ARTHROPLASTY;  Surgeon: Mardee Lynwood SQUIBB, MD;  Location: [facility] ORS;  Service: Orthopedics;  Laterality: Right;   TEE WITHOUT CARDIOVERSION N/A 11/07/2020   Procedure: TRANSESOPHAGEAL ECHOCARDIOGRAM (TEE);  Surgeon: Cave Redell, MD;  Location: [facility] ORS;  Service: Cardiovascular;  Laterality: N/A;   Social History:  reports that she has never smoked. She has never used smokeless tobacco. She reports that she does not currently use alcohol. She reports that she does not use drugs.  No Known Allergies  Family History  Problem Relation Age of Onset   Heart disease Mother        died when pt was 59 y.o    Heart Problems Mother    Heart disease Father    Alzheimer's disease Sister     Prior to Admission medications    Medication Sig Start Date End Date Taking? Authorizing Provider  Ascorbic Acid  (VITAMIN C  PO) Take 500 mg by mouth daily.   Yes [provider]  Calcium  Carbonate-Vit D-Min (CALCIUM  600+D3 PLUS MINERALS PO) Take 600 mg by mouth 2 (two) times daily.   Yes [provider]  methimazole  (TAPAZOLE ) 5 MG tablet Take by mouth. Patient taking differently: Take 2.5 mg by mouth daily. 07/08/23  Yes [provider]  metoprolol  succinate (TOPROL  XL) 25 MG 24 hr tablet Take 1 tablet (25 mg total) by mouth in the morning and at bedtime. 03/16/24  Yes Cindie Ole DASEN, MD  Multiple Vitamins-Minerals (MULTIVITAMIN WITH MINERALS) tablet Take 1 tablet by mouth every other day.   Yes [provider]  acetaminophen  (TYLENOL ) 650 MG CR tablet Take 1,300 mg by mouth every 8 (eight) hours as needed for pain. Patient not taking: Reported on 04/04/2024    [provider]    Physical Exam: Vitals:   04/07/24 2049 04/07/24 2311 04/08/24 0432 04/08/24 0732  BP: 102/71 112/84 101/73 109/88  Pulse: 93 (!) 113 (!) 121 (!) 146  Resp: (!) 22 (!) 21 20 18  Temp: 98.2 F (36.8 C) (!) 97 F (36.1 C) 97.6 F (36.4 C) 97.8 F (36.6 C)  TempSrc: Oral Oral Oral Oral  SpO2: 98% 100% 95% 100%  Weight:      Height:        Constitutional: Frail elderly female currently in no acute distress Eyes: PERRL, lids and conjunctivae normal ENMT: Mucous membranes are moist.  Fair dentition Neck: normal, supple,   Respiratory: clear to auscultation bilaterally, no wheezing, no crackles. Normal respiratory effort. No accessory muscle use.  Cardiovascular: Irregular irregular. No extremity edema. 2+ pedal pulses.   Abdomen: no tenderness, no masses palpated. Bowel sounds positive.  Musculoskeletal: no clubbing / cyanosis. No joint deformity upper and lower extremities. Good ROM, no contractures. N  Skin: no rashes, lesions, ulcers. No induration Neurologic: CN 2-12 grossly intact.  Able to  move all extremities Psychiatric: Normal judgment and insight. Alert and oriented x person and place normal mood.   Data Reviewed:   Reviewed labs, imaging's, and pertinent records as documented   Family Communication: Daughter updated over the phone Primary team communication:  Thank you very much for involving us  in the care of your patient.  Author: Maximino DELENA Sharps, MD 04/08/2024 4:50 PM  For on call review [email or web address].

## 2024-04-08 NOTE — Progress Notes (Signed)
 PHARMACY - ANTICOAGULATION CONSULT NOTE  Pharmacy Consult for heparin  Indication: atrial fibrillation  No Known Allergies  Patient Measurements: Height: 5' 3 (160 cm) Weight: 54.5 kg (120 lb 2.4 oz) IBW/kg (Calculated) : 52.4 HEPARIN  DW (KG): 54.5  Vital Signs: Temp: 97.8 F (36.6 C) (09/12 0732) Temp Source: Oral (09/12 0732) BP: 109/88 (09/12 0732) Pulse Rate: 146 (09/12 0732)  Labs: Recent Labs    04/06/24 0433 04/06/24 1547 04/07/24 0659 04/08/24 0423  HGB 11.5*  --  11.2* 12.3  HCT 35.5*  --  34.5* 37.2  PLT 121*  --  113* 127*  APTT 42* 83* 93* 74*  HEPARINUNFRC 0.61 0.65 0.53 0.39  CREATININE 0.94  --  0.79  --     Estimated Creatinine Clearance: 44.1 mL/min (by C-G formula based on SCr of 0.79 mg/dL).   Medical History: Past Medical History:  Diagnosis Date   A-fib [facility])    a.) CHA2DS2-VASc = 5 (age x 2, sex, HTN, aortic plaque). b.) s/p DCCV (200J x 2) 11/07/2020. c.) rate/rhythm maintained on oral amiodarone ; chronically anticoagulated using rivaroxaban .   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/2020   B12 deficiency    Basal cell carcinoma    CAD (coronary artery disease)    CAD (coronary artery disease) 11/22/2020   Cyst of right kidney    DDD (degenerative disc disease), lumbar    Hematuria 04/05/2020   History of chicken pox    History of kidney stones    HTN (hypertension)    Knee pain, bilateral 06/11/2018   Long term current use of anticoagulant    a.) rivaroxaban    Memory loss    Microcytosis    Osteopenia    PAC (premature atrial contraction)    Pain of right heel 04/05/2020   Premature atrial contraction 09/20/2019   Primary osteoarthritis of both knees 09/30/2020   Right shoulder pain 06/11/2018   Thyromegaly    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    Medications:  Medications Prior to  Admission  Medication Sig Dispense Refill Last Dose/Taking   Ascorbic Acid  (VITAMIN C  PO) Take 500 mg by mouth daily.   04/03/2024   Calcium  Carbonate-Vit D-Min (CALCIUM  600+D3 PLUS MINERALS PO) Take 600 mg by mouth 2 (two) times daily.   04/03/2024   methimazole  (TAPAZOLE ) 5 MG tablet Take by mouth. (Patient taking differently: Take 2.5 mg by mouth daily.)   04/04/2024   metoprolol  succinate (TOPROL  XL) 25 MG 24 hr tablet Take 1 tablet (25 mg total) by mouth in the morning and at bedtime. 180 tablet 3 04/04/2024   Multiple Vitamins-Minerals (MULTIVITAMIN WITH MINERALS) tablet Take 1 tablet by mouth every other day.   04/03/2024   acetaminophen  (TYLENOL ) 650 MG CR tablet Take 1,300 mg by mouth every 8 (eight) hours as needed for pain. (Patient not taking: Reported on 04/04/2024)   Not Taking   Scheduled:   acetaminophen   650 mg Oral Q8H   amiodarone   400 mg Oral BID   ascorbic acid   500 mg Oral Daily   calcium -vitamin D   1 tablet Oral Q breakfast   digoxin   0.0625 mg Oral QODAY   feeding supplement (NEPRO CARB STEADY)  237 mL Oral TID BM   Influenza vac split trivalent PF  0.5 mL Intramuscular Tomorrow-1000   methimazole   2.5 mg Oral  Daily   metoprolol  tartrate  25 mg Oral BID   multivitamin with minerals  1 tablet Oral QODAY   sodium chloride  flush  10-40 mL Intracatheter Q12H   sodium chloride  flush  3 mL Intravenous Q12H   thiamine   100 mg Oral Daily    Assessment: 83 yo female with afib and plans for possible PPM and AV ablation.  Xarelto  to be on hold and pharmacy to dose heparin  (last dose of Xarelto  was 9/8 at 5pm)  Heparin  level therapeutic at 0.39, CBC ok.  Goal of Therapy:  Heparin  level 0.3-0.7 units/ml aPTT 66-102 seconds Monitor platelets by anticoagulation protocol: Yes   Plan:  -Continue heparin  at 850 units/hr -Daily heparin  level, CBC, and for s/sx of bleeding    Ozell Jamaica, PharmD, BCPS, [facility] Clinical Pharmacist [phone] Please check AMION for all [facility]  numbers 04/08/2024

## 2024-04-08 NOTE — Progress Notes (Signed)
 Physical Therapy Treatment Patient Details Name: Kelli Smith MRN: [id number] DOB: 10-Aug-1940 Today's Date: 04/08/2024   History of Present Illness The pt is an 83 yo female presenting 9/8 from afib clinic with afib with RVR. S/p DCCV x2 in ED but persistent afib. PMH includes: afib, aortic atherosclerosis, arthritis, CAD, HTN, memory loss, TKA.    PT Comments  Pt follows simple commands well.  Progressing well toward goals, HR dropping with mobility.  Emphasis on warm up exercises and gait training with the RW.  HR between 116 and 133 bpms.  Pt's response to mobility was good, but pt got fatigued and needed to rest and the half way point.    If plan is discharge home, recommend the following: A little help with walking and/or transfers;Help with stairs or ramp for entrance;Assist for transportation;Supervision due to cognitive status;Assistance with cooking/housework   Can travel by private vehicle        Equipment Recommendations  Rolling walker (2 wheels)    Recommendations for Other Services       Precautions / Restrictions Precautions Precautions: Fall Recall of Precautions/Restrictions: Impaired     Mobility  Bed Mobility Overal bed mobility: Needs Assistance       Supine to sit: Contact guard     General bed mobility comments: pt able to transition to EOB without assist and relatively flat bed though with mild struggle.    Transfers Overall transfer level: Needs assistance Equipment used: Rolling walker (2 wheels) Transfers: Sit to/from Stand Sit to Stand: Min assist           General transfer comment: cues for hand placement, minimal forward and boost assist.    Ambulation/Gait Ambulation/Gait assistance: Min assist Gait Distance (Feet): 40 Feet (x1 and 15 feet to the bathroom) Assistive device: Rolling walker (2 wheels) Gait Pattern/deviations: Step-through pattern Gait velocity: decreased Gait velocity interpretation: <1.31 ft/sec, indicative of  household ambulator   General Gait Details: slow short, mildly unsteady steps with RWl.  HR actually lowered into the 110's, but was still variable and with periods aroud 140's   Stairs             Wheelchair Mobility     Tilt Bed    Modified Rankin (Stroke Patients Only)       Balance Overall balance assessment: Needs assistance Sitting-balance support: Bilateral upper extremity supported, Feet supported Sitting balance-Leahy Scale: Fair     Standing balance support: Single extremity supported, During functional activity, Reliant on assistive device for balance Standing balance-Leahy Scale: Poor Standing balance comment: Min A for balance                            Communication Communication Communication: Impaired Factors Affecting Communication: Hearing impaired  Cognition Arousal: Alert Behavior During Therapy: WFL for tasks assessed/performed, Flat affect   PT - Cognitive impairments: History of cognitive impairments, Attention, Memory, Awareness, Problem solving, Safety/Judgement, Sequencing                         Following commands: Impaired Following commands impaired: Follows multi-step commands with increased time, Follows multi-step commands inconsistently    Cueing Cueing Techniques: Verbal cues, Tactile cues, Visual cues  Exercises      General Comments General comments (skin integrity, edema, etc.): HR still labile, but slightly improved  with small drop in HR to the 110's.      Pertinent Vitals/Pain Pain Assessment Pain Assessment:  No/denies pain    Home Living                          Prior Function            PT Goals (current goals can now be found in the care plan section) Acute Rehab PT Goals PT Goal Formulation: Patient unable to participate in goal setting Time For Goal Achievement: 04/20/24 Potential to Achieve Goals: Fair Progress towards PT goals: Progressing toward goals     Frequency    Min 2X/week      PT Plan      Co-evaluation              AM-PAC PT 6 Clicks Mobility   Outcome Measure  Help needed turning from your back to your side while in a flat bed without using bedrails?: A Little Help needed moving from lying on your back to sitting on the side of a flat bed without using bedrails?: A Little Help needed moving to and from a bed to a chair (including a wheelchair)?: A Little Help needed standing up from a chair using your arms (e.g., wheelchair or bedside chair)?: A Little Help needed to walk in hospital room?: A Little Help needed climbing 3-5 steps with a railing? : A Lot 6 Click Score: 17    End of Session Equipment Utilized During Treatment: Gait belt Activity Tolerance: Patient tolerated treatment well;Patient limited by fatigue Patient left: Other (comment) (on toildt.  dtr watching pt for being finished before calling the nurse.) Nurse Communication: Mobility status PT Visit Diagnosis: Unsteadiness on feet (R26.81);Other abnormalities of gait and mobility (R26.89);Muscle weakness (generalized) (M62.81)     Time: 9057-8984 PT Time Calculation (min) (ACUTE ONLY): 33 min  Charges:    $Gait Training: 8-22 mins $Therapeutic Exercise: 8-22 mins                       04/08/2024  India HERO., PT Acute Rehabilitation Services [phone]  (office)04/08/2024  India HERO., PT Acute Rehabilitation Services [phone]  (office)   Kelli Smith 04/08/2024, 10:38 AM

## 2024-04-08 NOTE — Progress Notes (Signed)
  Patient Name: Kelli Smith Date of Encounter: 04/08/2024  Primary Cardiologist: Kelli Cave, MD Electrophysiologist: Kelli ONEIDA HOLTS, MD  Interval Summary   Much more interactive this AM, says she was throwing up after eating but no longer is.  Minor R wrist discomfort. No other complaints, denies chest pain, chest pressure, palpitations  Vital Signs    Vitals:   04/07/24 2049 04/07/24 2311 04/08/24 0432 04/08/24 0732  BP: 102/71 112/84 101/73 109/88  Pulse: 93 (!) 113 (!) 121 (!) 146  Resp: (!) 22 (!) 21 20 18   Temp: 98.2 F (36.8 C) (!) 97 F (36.1 C) 97.6 F (36.4 C) 97.8 F (36.6 C)  TempSrc: Oral Oral Oral Oral  SpO2: 98% 100% 95% 100%  Weight:      Height:        Intake/Output Summary (Last 24 hours) at 04/08/2024 0818 Last data filed at 04/08/2024 0510 Gross per 24 hour  Intake 1286.63 ml  Output --  Net 1286.63 ml   Filed Weights   04/04/24 1422  Weight: 54.5 kg    Physical Exam    GEN- NAD, much more Alert and oriented, chronically-ill appearing  Lungs- Clear to ausculation bilaterally, normal work of breathing Cardiac- Irregularly irregular rate and rhythm, no murmurs, rubs or gallops GI- soft, NT, ND, + BS Extremities- no clubbing or cyanosis. No edema  Telemetry    AFib 90-100s, rarely 130-140s Brief sinus beats (personally reviewed)  Hospital Course    Kelli Smith is a 83 y.o. female with a history of HTN, hyperthyroidism, aortic atherosclerosis, atrial flutter, atrial fibrillation; originally admitted for tiksoyn loading but had AKI, lactic acid, hypotension.  Restarted IV amiodarone   Assessment & Plan    #) persis AFib #) secondary hypercoag #) hypotension Not AF ablation candidate S/p DCCV x 2 in ER, unsuccessful without sinus beats Previously had mild hyperthyroid with amiodarone , no other AAD options Transition IV amio to PO, subclinical hyperthyroid on labs today Continue 0.0625 dig every other day Start 25mg   lopressor  BID Consider PPM implant w AVN, though prefer to avoid with patient's malnutrition Continue IV heparin  in case If rates remain general controlled, potential discharge tomorrow   #) thyroid  nodules #) subclinical hyperthyroid H/o of hyperthyroid on amiodarone , no other AAD options so was restarted on amio Continue 2.5mg  methimazole  Consulted hospitalists for further recommendation  #) reduced PO intake #) severe protein-calorie malnutrition Appreciate SLP eval, no observed gagging during eval Appreciate nutrition consult Encouraged PO intake  #) deconditioning PT eval   #) AKI - resolved        For questions or updates, please contact [facility] Please consult [email or web address] for contact info under     Signed, Kelli Graves, NP  04/08/2024, 8:18 AM

## 2024-04-08 NOTE — Plan of Care (Signed)

## 2024-04-09 ENCOUNTER — Other Ambulatory Visit (HOSPITAL_COMMUNITY): Payer: Self-pay

## 2024-04-09 DIAGNOSIS — I4811 Longstanding persistent atrial fibrillation: Secondary | ICD-10-CM | POA: Diagnosis not present

## 2024-04-09 DIAGNOSIS — I4819 Other persistent atrial fibrillation: Secondary | ICD-10-CM | POA: Diagnosis not present

## 2024-04-09 LAB — CBC
HCT: 35.8 % — ABNORMAL LOW (ref 36.0–46.0)
Hemoglobin: 11.5 g/dL — ABNORMAL LOW (ref 12.0–15.0)
MCH: 23.8 pg — ABNORMAL LOW (ref 26.0–34.0)
MCHC: 32.1 g/dL (ref 30.0–36.0)
MCV: 74.1 fL — ABNORMAL LOW (ref 80.0–100.0)
Platelets: 126 K/uL — ABNORMAL LOW (ref 150–400)
RBC: 4.83 MIL/uL (ref 3.87–5.11)
RDW: 18.1 % — ABNORMAL HIGH (ref 11.5–15.5)
WBC: 7.7 K/uL (ref 4.0–10.5)
nRBC: 0 % (ref 0.0–0.2)

## 2024-04-09 LAB — BASIC METABOLIC PANEL WITH GFR
Anion gap: 10 (ref 5–15)
BUN: 15 mg/dL (ref 8–23)
CO2: 22 mmol/L (ref 22–32)
Calcium: 8 mg/dL — ABNORMAL LOW (ref 8.9–10.3)
Chloride: 105 mmol/L (ref 98–111)
Creatinine, Ser: 0.79 mg/dL (ref 0.44–1.00)
GFR, Estimated: >60 mL/min (ref >60)
Glucose, Bld: 73 mg/dL (ref 70–99)
Potassium: 3.4 mmol/L — ABNORMAL LOW (ref 3.5–5.1)
Sodium: 137 mmol/L (ref 135–145)

## 2024-04-09 LAB — APTT: aPTT: 110 s — ABNORMAL HIGH (ref 24–36)

## 2024-04-09 LAB — MAGNESIUM: Magnesium: 2.1 mg/dL (ref 1.7–2.4)

## 2024-04-09 LAB — HEPARIN LEVEL (UNFRACTIONATED): Heparin Unfractionated: 0.37 [IU]/mL (ref 0.30–0.70)

## 2024-04-09 MED ORDER — METOPROLOL TARTRATE 25 MG PO TABS
25.0000 mg | ORAL_TABLET | Freq: Two times a day (BID) | ORAL | 1 refills | Status: DC
  Filled 2024-04-09: qty 60, 30d supply, fill #0

## 2024-04-09 MED ORDER — DIGOXIN 62.5 MCG PO TABS
0.0625 mg | ORAL_TABLET | ORAL | 1 refills | Status: DC
  Filled 2024-04-09: qty 15, 30d supply, fill #0

## 2024-04-09 MED ORDER — METHIMAZOLE 10 MG PO TABS
10.0000 mg | ORAL_TABLET | Freq: Every day | ORAL | 0 refills | Status: AC
  Filled 2024-04-09: qty 30, 30d supply, fill #0

## 2024-04-09 MED ORDER — AMIODARONE HCL 200 MG PO TABS
ORAL_TABLET | ORAL | 0 refills | Status: DC
  Filled 2024-04-09: qty 102, 74d supply, fill #0

## 2024-04-09 MED ORDER — POTASSIUM CHLORIDE 20 MEQ PO PACK
40.0000 meq | PACK | Freq: Once | ORAL | Status: AC
Start: 2024-04-09 — End: 2024-04-09
  Administered 2024-04-09: 40 meq via ORAL
  Filled 2024-04-09: qty 2

## 2024-04-09 MED ORDER — POTASSIUM CHLORIDE CRYS ER 20 MEQ PO TBCR: 40.0000 meq | EXTENDED_RELEASE_TABLET | ORAL | Status: DC

## 2024-04-09 MED ORDER — RIVAROXABAN 15 MG PO TABS
15.0000 mg | ORAL_TABLET | Freq: Every day | ORAL | 1 refills | Status: AC
  Filled 2024-04-09: qty 30, 30d supply, fill #0

## 2024-04-09 MED ORDER — METHIMAZOLE 10 MG PO TABS: 10.0000 mg | ORAL_TABLET | Freq: Every day | ORAL | Status: DC

## 2024-04-09 NOTE — Progress Notes (Signed)
 PROGRESS NOTE    Kelli Smith  FMW:[id number] DOB: [birth year] DOA: 04/04/2024 PCP: Pcp, No   Brief Narrative:  HPI: Kelli Smith is a 83 y.o. female with past medical history of hypertension, hyperthyroidism, aortic atherosclerosis, atrial flutter/fibrillation with RVR status post cardioversion x 2 in the ED with persistent atrial fibrillation and reports of weight loss.   She has been experiencing atrial fibrillation and has undergone multiple treatments without success. Previously, she was on amiodarone , which contributed to hyperthyroidism.  Her heart medication was switched to metoprolol , which she has been taking once a day for the last three years.  Daughter notes that she had been able to be weaned off the amiodarone  as well as methimazole  previously.   She has lost approximately 24 pounds since June. This weight loss is similar to a previous episode in 2022 when she lost 20 pounds. At that time, a complete workup revealed thyroid  nodules and atrial fibrillation.   She was initially treated with methimazole , which was later weaned off. However, patient was noted to have abnormal thyroid  values, she was restarted on methimazole  at a dose of 2.5 mg daily after labs earlier this year had noted elevated levels.  Review of records note that labs from 6/25 noted free T4 to be elevated at 1.26. She was scheduled for repeat labs and an ultrasound in the outpatient setting.  She follows at [facility] endocrinology clinic and has a follow-up appointment scheduled for Tuesday.  Hospital labs noted free T4 to be 2.02 TSH 3.108.  TRH consulted to evaluate for adjustment of medical management for hyperthyroidism prior to discharge.  Assessment & Plan:   Principal Problem:   Atrial fibrillation (HCC) Active Problems:   Hyperthyroidism   Protein-calorie malnutrition, severe  Persistent atrial fibrillation Patient presents with persistent atrial fibrillation not a candidate for ablation.  Status post  cardioversion x 2 in the ER without success.  Noted to have limited medication options at this point and a poor candidate for ablation and pacemaker placement.  Patient had been started on amiodarone  drip and recently transition to p.o. in addition to metoprolol .  Cardiology plans to switch her to oral amiodarone  and discharge her home today.   Hyperthyroidism History of thyroid  nodule Patient noted to have free T4- 2.02 with TSH- 3.108.  She was started back on methimazole  at 2.5 mg daily after labs back in June of this year noted free T4 elevated at 1.26.  Patient with prior history of having 3 thyroid  nodules which were greater than 1 cm for which she underwent fine-needle aspiration back in 2022.  We repeated patient's thyroid  ultrasound.  Please refer to results as below.  Hospitalist yesterday increased her methimazole  to 5 mg.  Discussed with cardiology, they are going to send her home on high dose of amiodarone , based on that, I believe she will need higher dose of methimazole .  At this point in time, I recommended discharging her on 10 mg of methimazole .  Knowing that she has a scheduled outpatient visit with her endocrinology in next 2 to 3 days makes me feel comfortable with this dosage.  Will defer to her primary endocrinologist to further adjust medications and act on it the ultrasound findings.   Weight loss Protein calorie malnutrition Acute. Patient totally lost 24 pounds since June.  Similar presentation and found to have thyroid  nodules back in 2022. Thought likely secondary to hyperthyroidism - Continue with protein shakes in between meals  DVT prophylaxis:    Code Status: Full Code  Family Communication: Daughter present at bedside.  Plan of care discussed with patient in length and he/she verbalized understanding and agreed with it.  Status is: Inpatient Remains inpatient appropriate because: Patient being discharged by primary service/cardiology today.   Estimated body mass  index is 21.28 kg/m as calculated from the following:   Height as of this encounter: 5' 3 (1.6 m).   Weight as of this encounter: 54.5 kg.    Nutritional Assessment: Body mass index is 21.28 kg/m.SABRA Seen by dietician.  I agree with the assessment and plan as outlined below: Nutrition Status: Nutrition Problem: Severe Malnutrition Etiology: lethargy/confusion, chronic illness, decreased appetite Signs/Symptoms: severe muscle depletion, energy intake < 75% for > or equal to 3 months, severe fat depletion, percent weight loss, edema Percent weight loss: 22.8 % (<3 mo) Interventions: Refer to RD note for recommendations  . Skin Assessment: I have examined the patient's skin and I agree with the wound assessment as performed by the wound care RN as outlined below:    Consultants:  TRH  Procedures:  As above  Antimicrobials:  Anti-infectives (From admission, onward)    None         Subjective: Seen and examined, daughter at the bedside.  Patient has no complaints at all.  Denies chest pain, palpitation, neck pain or headache.  Objective: Vitals:   04/08/24 2315 04/09/24 0455 04/09/24 0752 04/09/24 0822  BP: 98/74  111/71   Pulse: (!) 120 90 91 81  Resp:  18 13 18   Temp:  97.8 F (36.6 C) 97.8 F (36.6 C)   TempSrc:  Oral Oral   SpO2:  99% 98%   Weight:      Height:        Intake/Output Summary (Last 24 hours) at 04/09/2024 1123 Last data filed at 04/09/2024 1101 Gross per 24 hour  Intake 941.33 ml  Output 450 ml  Net 491.33 ml   Filed Weights   04/04/24 1422  Weight: 54.5 kg    Examination:  General exam: Appears calm and comfortable  Respiratory system: Clear to auscultation. Respiratory effort normal. Cardiovascular system: S1 & S2 heard, RRR. No JVD, murmurs, rubs, gallops or clicks. No pedal edema. Gastrointestinal system: Abdomen is nondistended, soft and nontender. No organomegaly or masses felt. Normal bowel sounds heard. Central nervous system:  Alert and oriented. No focal neurological deficits. Extremities: Symmetric 5 x 5 power. Skin: No rashes, lesions or ulcers Psychiatry: Judgement and insight appear normal. Mood & affect appropriate.    Data Reviewed: I have personally reviewed following labs and imaging studies  CBC: Recent Labs  Lab 04/04/24 0948 04/04/24 1222 04/06/24 0433 04/07/24 0659 04/08/24 0423 04/09/24 0525  WBC 9.1  --  9.3 8.1 9.8 7.7  NEUTROABS 6.8  --   --   --   --   --   HGB 14.2 12.2 11.5* 11.2* 12.3 11.5*  HCT 44.6 36.0 35.5* 34.5* 37.2 35.8*  MCV 74.7*  --  72.9* 72.5* 71.8* 74.1*  PLT 188  --  121* 113* 127* 126*   Basic Metabolic Panel: Recent Labs  Lab 04/05/24 0620 04/06/24 0433 04/07/24 0659 04/08/24 0420 04/09/24 0525  NA 142 141 137 135 137  K 3.8 3.4* 3.2* 3.9 3.4*  CL 108 108 104 107 105  CO2 21* 22 24 16* 22  GLUCOSE 139* 99 90 103* 73  BUN 57* 46* 26* 18 15  CREATININE 1.11* 0.94 0.79 0.90 0.79  CALCIUM  8.8* 8.4* 8.0* 7.9* 8.0*  MG  2.3 1.9 1.8 2.1 2.1   GFR: Estimated Creatinine Clearance: 44.1 mL/min (by C-G formula based on SCr of 0.79 mg/dL). Liver Function Tests: No results for input(s): AST, ALT, ALKPHOS, BILITOT, PROT, ALBUMIN in the last 168 hours. No results for input(s): LIPASE, AMYLASE in the last 168 hours. No results for input(s): AMMONIA in the last 168 hours. Coagulation Profile: No results for input(s): INR, PROTIME in the last 168 hours. Cardiac Enzymes: No results for input(s): CKTOTAL, CKMB, CKMBINDEX, TROPONINI in the last 168 hours. BNP (last 3 results) No results for input(s): PROBNP in the last 8760 hours. HbA1C: No results for input(s): HGBA1C in the last 72 hours. CBG: Recent Labs  Lab 04/05/24 2139  GLUCAP 125*   Lipid Profile: No results for input(s): CHOL, HDL, LDLCALC, TRIG, CHOLHDL, LDLDIRECT in the last 72 hours. Thyroid  Function Tests: Recent Labs    04/08/24 0423  TSH 3.108   FREET4 2.02*   Anemia Panel: Recent Labs    04/07/24 1027  VITAMINB12 828   Sepsis Labs: Recent Labs  Lab 04/04/24 1222 04/04/24 1559  LATICACIDVEN 2.4* 2.1*    No results found for this or any previous visit (from the past 240 hours).   Radiology Studies: US  THYROID  Result Date: 04/09/2024 CLINICAL DATA:  Goiter. Known multinodular goiter. History of prior fine-needle aspiration biopsy reported but it is unclear what nodules were previously biopsied. EXAM: THYROID  ULTRASOUND TECHNIQUE: Ultrasound examination of the thyroid  gland and adjacent soft tissues was performed. COMPARISON:  Prior thyroid  ultrasound 12/28/2020 FINDINGS: Parenchymal Echotexture: Moderately heterogenous Isthmus: 0.3 cm Right lobe: 5.9 x 3.9 x 2.7 cm Left lobe: 5.7 x 3.1 x 4.3 cm _________________________________________________________ Estimated total number of nodules >/= 1 cm: 4 Number of spongiform nodules >/=  2 cm not described below (TR1): 0 Number of mixed cystic and solid nodules >/= 1.5 cm not described below (TR2): 0 _________________________________________________________ Nodule # 1: Vague isoechoic solid nodule occupying the majority of the right mid gland measures approximately 2.9 x 2.5 x 2.8 cm, similar to slightly smaller compared to 3.3 x 2.6 x 2.9 cm previously. Slight involution over time is suggestive of benignity. Nodule # 2: Ovoid, taller than wide hypoechoic nodule in the right lower gland measures approximately 4.8 x 3.9 x 4.8 cm. Findings are consistent with TI-RADS category 5. While present in the on the prior examination in retrospect, this structure was not measured as a discrete nodule and may in fact represent a pseudo nodule. **Given size (>/= 1.0 cm) and appearance, fine needle aspiration of this highly suspicious nodule should be considered based on TI-RADS criteria. Nodule # 3: Peripherally calcified hypoechoic nodule in the left upper gland measures 1.4 x 1.2 x 1.3 cm, this is slightly  smaller than previously seen at 1.6 x 1.3 x 1.4 cm. TI-RADS category 4. Decreasing size is consistent with a benign lesion. Nodule # 4: Vague goitrous heterogeneity of the left inferior gland. No definite discrete nodule. IMPRESSION: IMPRESSION 1. Bulky, enlarged and diffusely goitrous/multinodular thyroid  gland. 2. Possible 4.8 cm TI-RADS category 5 nodule versus pseudo nodule in the right inferior gland is labeled # 2. While not discretely measured on the prior examination, it can be visualized in retrospect. This lesion would meet criteria for fine-needle aspiration biopsy if a true nodule. However, general stability over the past 3 years is quite reassuring. 3. Nodule/pseudo nodule # 1 in the right mid gland demonstrates a decrease in overall size compared to prior imaging from 2022. Involution over time is  consistent with benignity. No further follow-up. 4. Nodule # 3 in the left upper gland demonstrates slight interval involution over the past 3 years consistent with a benign process. The above is in keeping with the ACR TI-RADS recommendations - J Am Coll Radiol 2017;14:587-595. Electronically Signed   By: Wilkie Lent M.D.   On: 04/09/2024 08:08    Scheduled Meds:  acetaminophen   650 mg Oral Q8H   amiodarone   400 mg Oral BID   ascorbic acid   500 mg Oral Daily   calcium -vitamin D   1 tablet Oral Q breakfast   digoxin   0.0625 mg Oral QODAY   feeding supplement (NEPRO CARB STEADY)  237 mL Oral TID BM   Influenza vac split trivalent PF  0.5 mL Intramuscular Tomorrow-1000   [START ON 04/10/2024] methimazole   10 mg Oral Daily   metoprolol  tartrate  25 mg Oral BID   multivitamin with minerals  1 tablet Oral QODAY   sodium chloride  flush  10-40 mL Intracatheter Q12H   sodium chloride  flush  3 mL Intravenous Q12H   thiamine   100 mg Oral Daily   Continuous Infusions:  heparin  850 Units/hr (04/09/24 0529)     LOS: 5 days   Fredia Skeeter, MD [facility]  04/09/2024, 11:23 AM    *Please note that this is a verbal dictation therefore any spelling or grammatical errors are due to the Dragon Medical One system interpretation.  Please page via Amion and do not message via secure chat for urgent patient care matters. Secure chat can be used for non urgent patient care matters.  How to contact the [facility] Attending or Consulting provider 7A - 7P or covering provider during after hours 7P -7A, for this patient?  Check the care team in [facility] and look for a) attending/consulting TRH provider listed and b) the TRH team listed. Page or secure chat 7A-7P. Log into [email or web address] and use [place]'s universal password to access. If you do not have the password, please contact the hospital operator. Locate the [facility] provider you are looking for under [facility] and page to a number that you can be directly reached. If you still have difficulty reaching the provider, please page the [facility] (Director on Call) for the Hospitalists listed on amion for assistance.

## 2024-04-09 NOTE — Progress Notes (Signed)
 Urine sample collected and sent to lab on 04/09/2024 at 0830.

## 2024-04-09 NOTE — Progress Notes (Signed)
  Progress Note  Patient Name: Kelli Smith Date of Encounter: 04/09/2024 [facility] Cardiologist: Redell Cave, MD   Interval Summary   No acute overnight events. Patient reports feeling relatively well. No new or acute complaints.   Vital Signs Vitals:   04/08/24 2311 04/08/24 2315 04/09/24 0455 04/09/24 0752  BP: 98/74 98/74  111/71  Pulse: (!) 122 (!) 120 90 91  Resp: 20  18 13   Temp: 97.6 F (36.4 C)  97.8 F (36.6 C) 97.8 F (36.6 C)  TempSrc: Oral  Oral Oral  SpO2: 98%  99% 98%  Weight:      Height:        Intake/Output Summary (Last 24 hours) at 04/09/2024 0801 Last data filed at 04/09/2024 0529 Gross per 24 hour  Intake 941.33 ml  Output --  Net 941.33 ml      04/04/2024    2:22 PM 04/04/2024    8:59 AM 03/23/2024    3:55 PM  Last 3 Weights  Weight (lbs) 120 lb 2.4 oz 116 lb 9.6 oz 116 lb 12.8 oz  Weight (kg) 54.5 kg 52.889 kg 52.98 kg      Telemetry/ECG  AF with range of heart rates, averaging <100 for most of the past 24 hours aside from a few hours overnight. - Personally Reviewed  Physical Exam  General: Well developed, in no acute distress.  Neck: No JVD.  Cardiac: Normal rate, irregular rhythm.  Resp: Normal work of breathing.  Ext: No edema.  Neuro: No gross focal deficits.  Psych: Normal affect.   Assessment & Plan  Kelli Smith is a 83 y.o. female with a history of HTN, hyperthyroidism, aortic atherosclerosis, atrial flutter, atrial fibrillation; originally admitted for tiksoyn loading but had AKI, lactic acid, hypotension.   Loaded with IV amiodarone .  Now transition to oral amiodarone , oral metoprolol , oral digoxin  with much improved heart rates.  Problem List: #Persistent atrial fibrillation: Not a candidate for AF ablation.  Was unable to be loaded on Tikosyn  due to AKI.  She is now on amiodarone . #Hypercoagulable state due to AF -Continue oral amiodarone  load.  400 mg twice daily for 1 week, then 200 mg twice daily for 1  week, then 200 mg daily thereafter.  She is currently on methimazole . If possible, would like to be to use amiodarone  with very careful monitoring of the thyroid  and use of methimazole .  If ultimately amiodarone  is not tolerated, can stop this medication and plan for a permanent pacemaker implant followed by AV nodal ablation.  This is not being pursued at this time because I think she is a poor candidate with recent weight loss, low body weight, frailty. She has lost approximately 15 kg in the last 3 months. -Continue metoprolol  tartrate 25 mg twice daily. -Continue digoxin  0.625 mg every other day. -Transition IV heparin  to Xarelto  15 mg daily.  Reduced dose based on age greater than 80 and weight less than 60 kg.  Has also had fluctuations in creatinine.  #Hyperthyroidism #H/o thyroid  nodule - Increase to methimazole  10 mg on discharge. - Ultrasound of thyroid  seems to be relatively stable. - She will follow-up with [facility] clinic endocrinology.   Anticipate discharge later today.  We will arrange for follow-up in 1 week with labs.  Signed, Fonda Kitty, MD

## 2024-04-09 NOTE — Progress Notes (Signed)
 PHARMACY - ANTICOAGULATION CONSULT NOTE  Pharmacy Consult for heparin  Indication: atrial fibrillation  No Known Allergies  Patient Measurements: Height: 5' 3 (160 cm) Weight: 54.5 kg (120 lb 2.4 oz) IBW/kg (Calculated) : 52.4 HEPARIN  DW (KG): 54.5  Vital Signs: Temp: 97.8 F (36.6 C) (09/13 0455) Temp Source: Oral (09/13 0455) BP: 98/74 (09/12 2315) Pulse Rate: 90 (09/13 0455)  Labs: Recent Labs    04/07/24 0659 04/08/24 0420 04/08/24 0423 04/09/24 0525  HGB 11.2*  --  12.3 11.5*  HCT 34.5*  --  37.2 35.8*  PLT 113*  --  127* 126*  APTT 93*  --  74* 110*  HEPARINUNFRC 0.53  --  0.39 0.37  CREATININE 0.79 0.90  --  0.79   Estimated Creatinine Clearance: 44.1 mL/min (by C-G formula based on SCr of 0.79 mg/dL).  Medical History: Past Medical History:  Diagnosis Date   A-fib V [facility])    a.) CHA2DS2-VASc = 5 (age x 2, sex, HTN, aortic plaque). b.) s/p DCCV (200J x 2) 11/07/2020. c.) rate/rhythm maintained on oral amiodarone ; chronically anticoagulated using rivaroxaban .   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/2020   B12 deficiency    Basal cell carcinoma    CAD (coronary artery disease)    CAD (coronary artery disease) 11/22/2020   Cyst of right kidney    DDD (degenerative disc disease), lumbar    Hematuria 04/05/2020   History of chicken pox    History of kidney stones    HTN (hypertension)    Knee pain, bilateral 06/11/2018   Long term current use of anticoagulant    a.) rivaroxaban    Memory loss    Microcytosis    Osteopenia    PAC (premature atrial contraction)    Pain of right heel 04/05/2020   Premature atrial contraction 09/20/2019   Primary osteoarthritis of both knees 09/30/2020   Right shoulder pain 06/11/2018   Thyromegaly    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   04/03/2024   Calcium  Carbonate-Vit D-Min (CALCIUM  600+D3 PLUS MINERALS PO) Take 600 mg by mouth 2 (two) times daily.   04/03/2024   methimazole  (TAPAZOLE ) 5 MG tablet Take by mouth. (Patient taking differently: Take 2.5 mg by mouth daily.)   04/04/2024   metoprolol  succinate (TOPROL  XL) 25 MG 24 hr tablet Take 1 tablet (25 mg total) by mouth in the morning and at bedtime. 180 tablet 3 04/04/2024   Multiple Vitamins-Minerals (MULTIVITAMIN WITH MINERALS) tablet Take 1 tablet by mouth every other day.   04/03/2024   acetaminophen  (TYLENOL ) 650 MG CR tablet Take 1,300 mg by mouth every 8 (eight) hours as needed for pain. (Patient not taking: Reported on 04/04/2024)   Not Taking   Scheduled:   acetaminophen   650 mg Oral Q8H   amiodarone   400 mg Oral BID   ascorbic acid   500 mg Oral Daily   calcium -vitamin D   1 tablet Oral Q breakfast   digoxin   0.0625 mg Oral QODAY   feeding supplement (NEPRO CARB STEADY)  237 mL Oral TID BM   Influenza vac split trivalent PF  0.5 mL Intramuscular Tomorrow-1000   methimazole   5 mg Oral Daily  metoprolol  tartrate  25 mg Oral BID   multivitamin with minerals  1 tablet Oral QODAY   sodium chloride  flush  10-40 mL Intracatheter Q12H   sodium chloride  flush  3 mL Intravenous Q12H   thiamine   100 mg Oral Daily   Assessment: 83 yo female with afib and plans for possible PPM and AV ablation. Pt was taking Xarelto  PTA, which was placed on hold for possible plans for AV ablation. Last dose of Xarelto  was 9/8 at 5pm. Pharmacy has been consulted to dose heparin . Per electrophysiology, not a candidate for a-fib ablation but continue IV heparin  while inpatient.   Given the two prior daily heparin  levels and aPTT levels correlated, using heparin  levels only for therapeutic monitoring. Heparin  level this AM was therapeutic at 0.37 on infusion of 850 units/h. No issues  with heparin  infusion of s/sx of bleeding documented. Hgb mostly stable at 11.5 from 12.3 and platelets stable at 126.   Goal of Therapy:  Heparin  level 0.3-0.7 units/ml Monitor platelets by anticoagulation protocol: Yes   Plan:  -Continue heparin  at 850 units/hr -Daily heparin  level, CBC, and for s/sx of bleeding -Discontinue lab monitoring for aPTT   Maurilio Patten, PharmD PGY1 Pharmacy Resident [facility] 04/09/2024 7:20 AM

## 2024-04-09 NOTE — Discharge Summary (Addendum)
 ELECTROPHYSIOLOGY DISCHARGE SUMMARY    Patient ID: Kelli Smith,  MRN: [id number], DOB/AGE: [birth year] 83 y.o.  Admit date: 04/04/2024 Discharge date: 04/09/2024  Primary Care Physician: Pcp, No  Primary Cardiologist: Redell Cave, MD  Electrophysiologist: Dr. Cindie   Primary Discharge Diagnosis:  Persistent atrial fibrillation   Secondary Discharge Diagnosis:  Hypotension Hyperthyroidism Protein calorie malnutrition Acute kidney injury (resolved)  No Known Allergies    Brief HPI: Kelli Smith is a 83 y.o. female with a past medical history HTN, hyperthyroidism, aortic atherosclerosis, atrial flutter, atrial fibrillation. They were referred to EP for treatment options of atrial fibrillation.  Risks, benefits, and alternatives to Tikosyn  were reviewed with the patient who wished to proceed with admission for loading. Patient was seen in AFib clinic on 09/08 for follow up for atrial fibrillation/pre-dofetilide  loading. That day, her daughter reported that she felt poorly with extreme fatigue. No presyncope. Patient found to be in afib w RVR to 180s with increased fatigue and recommended to proceed to ER.   Hospital Course:   Persistent atrial fibrillation Upon arrival in the ED, attempted DCCV for AF/RVR with rates in the 180s however this did not restore sinus rhythm. Given AKI and mild lactic acidosis, patient admitted for Amiodarone  loading rather than tikosyn . In the setting of persistent RVR, Digoxin  low dose added. Patient with improved rate control, brief salvos of atrial tachycardia vs afib. Lopressor  25mg  BID also added.  Continue oral amiodarone  load.  400 mg twice daily for 1 week, then 200 mg twice daily for 1 week, then 200 mg daily thereafter.  She is currently on methimazole . Would like to be to use amiodarone  with very careful monitoring of the thyroid  and use of methimazole .  If ultimately amiodarone  is not tolerated, can stop this medication and plan for  a permanent pacemaker implant followed by AV nodal ablation.  This is not being pursued at this time because she is a poor candidate with recent weight loss, low body weight, frailty. She has lost approximately 15 kg in the last 3 months.  Continue Metoprolol  Tartrate 25mg  BID Continue Digoxin  0.625mg  every other day Continue Xarelto  15mg  nightly (reduced dose with age >3, weight less than 60kg.  Close outpatient follow-up has been arranged.   Hyperthyroidism Given history of hyperthyroidism and Amiodarone  loading, [facility] hospitalists consulted to assist with Methimazole  dosing. Overall stable appearing thyroid  US . Given plan to d/c patient on amiodarone , they recommend increasing her methimazole  to 10mg  daily. Patient has close follow up with outpatient endocrinologist.  Continue Methimazole  10mg  daily at d/c.   Physical Exam: Vitals:   04/09/24 0455 04/09/24 0752 04/09/24 0822 04/09/24 1231  BP:  111/71  (!) 90/58  Pulse: 90 91 81 64  Resp: 18 13 18 14   Temp: 97.8 F (36.6 C) 97.8 F (36.6 C)  97.9 F (36.6 C)  TempSrc: Oral Oral  Axillary  SpO2: 99% 98%  99%  Weight:      Height:        Labs:   Lab Results  Component Value Date   WBC 7.7 04/09/2024   HGB 11.5 (L) 04/09/2024   HCT 35.8 (L) 04/09/2024   MCV 74.1 (L) 04/09/2024   PLT 126 (L) 04/09/2024    Recent Labs  Lab 04/09/24 0525  NA 137  K 3.4*  CL 105  CO2 22  BUN 15  CREATININE 0.79  CALCIUM  8.0*  GLUCOSE 73    Discharge Medications:  Allergies as of 04/09/2024   No Known  Allergies      Medication List     STOP taking these medications    metoprolol  succinate 25 MG 24 hr tablet Commonly known as: Toprol  XL       TAKE these medications    acetaminophen  650 MG CR tablet Commonly known as: TYLENOL  Take 1,300 mg by mouth every 8 (eight) hours as needed for pain.   amiodarone  200 MG tablet Commonly known as: PACERONE  Take 2 tablets (400 mg total) by mouth 2 (two) times daily for 7 days,  THEN 1 tablet (200 mg total) 2 (two) times daily for 7 days, THEN 1 tablet (200 mg total) daily. Start taking on: April 09, 2024   CALCIUM  600+D3 PLUS MINERALS PO Take 600 mg by mouth 2 (two) times daily.   Digoxin  62.5 MCG Tabs Take 0.0625 mg by mouth every other day. Start taking on: April 11, 2024   methimazole  10 MG tablet Commonly known as: TAPAZOLE  Take 1 tablet (10 mg total) by mouth daily. Start taking on: April 10, 2024 What changed:  medication strength how much to take when to take this   metoprolol  tartrate 25 MG tablet Commonly known as: LOPRESSOR  Take 1 tablet (25 mg total) by mouth 2 (two) times daily.   multivitamin with minerals tablet Take 1 tablet by mouth every other day.   Rivaroxaban  15 MG Tabs tablet Commonly known as: Xarelto  Take 1 tablet (15 mg total) by mouth daily with supper.   VITAMIN C  PO Take 500 mg by mouth daily.        Disposition:  Home with follow up  Signed, Artist Pouch, PA-C  04/09/2024 1:08 PM   I have seen, examined the patient, and reviewed the above assessment and plan.    Hospital Course: Patient was admitted after being seen in AF clinic for Tikosyn  loading.  She was found to have atrial fibrillation with rapid ventricular rates and was sent to the ED from AF clinic.  She underwent cardioversion in the ED with near immediate recurrence of AF.  She was found to have a rise in her baseline creatinine which prohibited Tikosyn  loading.  Amiodarone  was started.  She had improvement in her rates after receiving IV amiodarone  load.  She was also maintained on metoprolol  and digoxin .  Internal medicine assisted with thyroid  management in setting of initiation of amiodarone .  Patient symptomatically improved and was discharged home with follow-up arranged.  General: Well developed, in no acute distress.  Neck: No JVD.  Cardiac: Normal rate, regular rhythm.  Resp: Normal work of breathing.  Ext: No edema.  Neuro: No  gross focal deficits.  Psych: Normal affect.   Assessment and Plan:  #Persistent atrial fibrillation: Not a candidate for AF ablation.  Was unable to be loaded on Tikosyn  due to AKI.  She is now on amiodarone . #Hypercoagulable state due to AF -Continue oral amiodarone  load.  400 mg twice daily for 1 week, then 200 mg twice daily for 1 week, then 200 mg daily thereafter.  She is currently on methimazole . If possible, would like to be to use amiodarone  with very careful monitoring of the thyroid  and use of methimazole .  If ultimately amiodarone  is not tolerated, can stop this medication and plan for a permanent pacemaker implant followed by AV nodal ablation.  This is not being pursued at this time because I think she is a poor candidate with recent weight loss, low body weight, frailty. She has lost approximately 15 kg in the last 3 months. -  Continue metoprolol  tartrate 25 mg twice daily. -Continue digoxin  0.625 mg every other day. -Transition IV heparin  to Xarelto  15 mg daily.  Reduced dose based on age greater than 80 and weight less than 60 kg.  Has also had fluctuations in creatinine.   #Hyperthyroidism #H/o thyroid  nodule - Increase to methimazole  10 mg on discharge. - Ultrasound of thyroid  seems to be relatively stable. - She will follow-up with [facility] endocrin[facility].  Duration of discharge encounter: 35 minutes  Fonda Kitty, MD 04/13/2024 10:39 PM'

## 2024-04-11 LAB — ZINC: Zinc: 63 ug/dL (ref 44–115)

## 2024-04-11 LAB — VITAMIN A: Vitamin A (Retinoic Acid): 25.5 ug/dL (ref 22.0–69.5)

## 2024-04-12 DIAGNOSIS — M8580 Other specified disorders of bone density and structure, unspecified site: Secondary | ICD-10-CM | POA: Diagnosis not present

## 2024-04-12 DIAGNOSIS — E43 Unspecified severe protein-calorie malnutrition: Secondary | ICD-10-CM | POA: Diagnosis not present

## 2024-04-12 DIAGNOSIS — M51369 Other intervertebral disc degeneration, lumbar region without mention of lumbar back pain or lower extremity pain: Secondary | ICD-10-CM | POA: Diagnosis not present

## 2024-04-12 DIAGNOSIS — I959 Hypotension, unspecified: Secondary | ICD-10-CM | POA: Diagnosis not present

## 2024-04-12 DIAGNOSIS — E042 Nontoxic multinodular goiter: Secondary | ICD-10-CM | POA: Diagnosis not present

## 2024-04-12 DIAGNOSIS — M1710 Unilateral primary osteoarthritis, unspecified knee: Secondary | ICD-10-CM | POA: Diagnosis not present

## 2024-04-12 DIAGNOSIS — M47816 Spondylosis without myelopathy or radiculopathy, lumbar region: Secondary | ICD-10-CM | POA: Diagnosis not present

## 2024-04-12 DIAGNOSIS — I4892 Unspecified atrial flutter: Secondary | ICD-10-CM | POA: Diagnosis not present

## 2024-04-12 DIAGNOSIS — E0520 Thyrotoxicosis with toxic multinodular goiter without thyrotoxic crisis or storm: Secondary | ICD-10-CM | POA: Diagnosis not present

## 2024-04-12 DIAGNOSIS — T462X5S Adverse effect of other antidysrhythmic drugs, sequela: Secondary | ICD-10-CM | POA: Diagnosis not present

## 2024-04-12 DIAGNOSIS — I119 Hypertensive heart disease without heart failure: Secondary | ICD-10-CM | POA: Diagnosis not present

## 2024-04-12 DIAGNOSIS — I700 Atherosclerosis of aorta: Secondary | ICD-10-CM | POA: Diagnosis not present

## 2024-04-12 DIAGNOSIS — Q6101 Congenital single renal cyst: Secondary | ICD-10-CM | POA: Diagnosis not present

## 2024-04-12 DIAGNOSIS — I083 Combined rheumatic disorders of mitral, aortic and tricuspid valves: Secondary | ICD-10-CM | POA: Diagnosis not present

## 2024-04-12 DIAGNOSIS — E872 Acidosis, unspecified: Secondary | ICD-10-CM | POA: Diagnosis not present

## 2024-04-12 DIAGNOSIS — D649 Anemia, unspecified: Secondary | ICD-10-CM | POA: Diagnosis not present

## 2024-04-12 DIAGNOSIS — Z86018 Personal history of other benign neoplasm: Secondary | ICD-10-CM | POA: Diagnosis not present

## 2024-04-12 DIAGNOSIS — M19011 Primary osteoarthritis, right shoulder: Secondary | ICD-10-CM | POA: Diagnosis not present

## 2024-04-12 DIAGNOSIS — Z85828 Personal history of other malignant neoplasm of skin: Secondary | ICD-10-CM | POA: Diagnosis not present

## 2024-04-12 DIAGNOSIS — I4819 Other persistent atrial fibrillation: Secondary | ICD-10-CM | POA: Diagnosis not present

## 2024-04-12 DIAGNOSIS — I491 Atrial premature depolarization: Secondary | ICD-10-CM | POA: Diagnosis not present

## 2024-04-12 DIAGNOSIS — E0590 Thyrotoxicosis, unspecified without thyrotoxic crisis or storm: Secondary | ICD-10-CM | POA: Diagnosis not present

## 2024-04-12 DIAGNOSIS — F0390 Unspecified dementia without behavioral disturbance: Secondary | ICD-10-CM | POA: Diagnosis not present

## 2024-04-12 DIAGNOSIS — I2510 Atherosclerotic heart disease of native coronary artery without angina pectoris: Secondary | ICD-10-CM | POA: Diagnosis not present

## 2024-04-12 DIAGNOSIS — I3139 Other pericardial effusion (noninflammatory): Secondary | ICD-10-CM | POA: Diagnosis not present

## 2024-04-12 DIAGNOSIS — E538 Deficiency of other specified B group vitamins: Secondary | ICD-10-CM | POA: Diagnosis not present

## 2024-04-12 DIAGNOSIS — D6869 Other thrombophilia: Secondary | ICD-10-CM | POA: Diagnosis not present

## 2024-04-12 NOTE — Progress Notes (Addendum)
 History of present illness Kelli Smith is seen today for f/u of her thyroid .  Most of the hx is through her daughter Kelli Smith: [phone]).  She is a 83 y.o. female who originally had a CT scan and that showed thyroid  nodules in 2022.  Nodules were confirmed with u/s in 6/22.  I first saw her in 7/22 and performed an FNA later that month that returned benign.  Her TSH was suppressed in 3/23.  She was on amiodarone  and it was stopped at that time.  I last saw her in 12/24.  At that time, I restarted her on MMZ. In 9/25, she was admitted to the hospital due Afib and cardiology increased her MMZ dose and started her on amiodarone .   She is currently on MMZ 10 mg daily.  She feels well overall.  She denies palpitations. She remains is back on amiodarone .  She denies hot/cold intolerance.  She cannot tell that the nodules are there.  They do not interfere with swallowing or breathing.  She has no anterior neck pain or swelling.  She is concerned about her thyroid .   ROS:  No chest pain.  No SOB.  Medical History: Past Medical History:  Diagnosis Date  . Atrial fibrillation (CMS/HHS-HCC)   . Chicken pox   . DDD (degenerative disc disease), lumbar 04/05/2020  . Knee pain, bilateral 06/11/2018  . Lumbar radiculopathy 04/05/2020  . Memory loss   . Osteoarthritis   . Osteopenia     Surgical History: Past Surgical History:  Procedure Laterality Date  . FOOT SURGERY  2009   BUNION AND HAMMER TOE  .  Left total knee arthroplasty using computer-assisted navigation  12/12/2020   Dr Mardee  . Right total knee arthroplasty using computer-assisted navigation  10/28/2021   Dr Mardee  . CESAREAN SECTION    . LENS EYE SURGERY Bilateral 20136/2014   CATARACT SURGERY    Social History:  reports that she has never smoked. She has never used smokeless tobacco. She reports that she does not currently use alcohol. She reports that she does not use drugs.  Widowed.   Family History: family  history includes Heart disease in her father and mother.  Medications: Current Outpatient Medications  Medication Sig Dispense Refill  . AMIOdarone  (PACERONE ) 200 MG tablet Take 400 mg by mouth 2 (two) times daily For 7 days, 1 tablet bid for 7 days, then 1 tablet daily    . ascorbic acid  (VITAMIN C  ORAL) Take by mouth    . Ca-D3-mag ox-zinc -cop-mang-bor 600 mg calcium - 20 mcg-50 mg Tab 1 tablet 2 (two) times daily Every other day    . DERMA-SMOOTHE/FS SCALP OIL 0.01 % scalp oil APPLY TO DAMP SCALP ONE-TWO NIGHTS PER WEEK COVER WITH SHOWER CAP AND SHAMPOO IN MORNING.    . digoxin  62.5 mcg (0.0625 mg) Tab Take by mouth    . FUROsemide  (LASIX ) 20 MG tablet once daily as needed    . methIMAzole  (TAPAZOLE ) 5 MG tablet Take 0.5 tablets (2.5 mg total) by mouth once daily (Patient taking differently: Take 10 mg by mouth once daily) 45 tablet 1  . metoprolol  succinate (TOPROL -XL) 25 MG XL tablet Take 2 tablets by mouth once daily    . rivaroxaban  (XARELTO ) 20 mg tablet Take 15 mg by mouth daily with dinner    . donepeziL  (ARICEPT ) 5 MG tablet Take 5 mg by mouth at bedtime (Patient not taking: Reported on 04/12/2024)     No current facility-administered medications for this  visit.    Allergies: No Known Allergies  Physical Exam: Vitals:   04/12/24 0956  BP: 114/60  Weight: 52.2 kg (115 lb)  Height: 160 cm (5' 3)     Body mass index is 20.37 kg/m. GENERAL: Pleasant, well-appearing female in no distress.  NECK:    Physical exam otherwise deferred due to coronavirus precautions.   Labs: 11/02/2020:  TSH = 0.72, FT4 = 1.63. 11/30/2020:  K/Cr/Ca = 3.3/0.7/9.4.  LFTs nl. 12/28/2020:  U/s showed multiple nodules.  In the right inferior pole was a 3.3x2.9x2.6 cm solid, hypoechoic, taller than wide (TR7) nodule.  In the left upper pole was a 1.6x1.4x1.3 cm solid, hypoechoic nodule with calcifications (TR4).  In the left inferior pole was 4.0x3.8x3.1 cm solid, hypoehcoic, taller than wide (TR7)  nodule.   02/06/2021: FNA of right inferior 3.3 x 2.9 x 2.6 cm and left inferior 4.0 x 3.8 x 3.1 cm thyroid  nodules benign.   10/14/2021:  K/Cr/Ca = 3.6/0.55/9.3.  LFTs nl.  10/21/2021:  TSH < 0.01.  FT4 = 3.41.  12/24/2021:  TSH < 0.01.  FT4 = 3.05.  02/12/2022: Thyroid  ultrasound with multiple nodules.  None of the measured nodules (including previously biopsied nodules) have enlarged.  02/20/2022:  TSH < 0.01.  FT4 = 2.09 04/18/2022:  TSH<0.01.  FT4=1.25.  FT3=2.9.  TSI<0.1.  06/18/2022:  TSH = 0.82.  FT4 =  0.69.  09/15/2022: TSH = 2.713.  Free T4 = 0.85.  Free T3 =2.7. 01/07/2023: TSH = 2.771.  Free T4 = 0.75.   04/15/2023: TSH = 0.679.  Free T4 = 0.85.   04/15/2023:  Thyroid  ultrasound with MNG.  Some nodules may have enlarged, but given heterogeneous nature and prior negative biopsies, recommend repeat u/s in 1 year. 07/08/2023: TSH=0.359.  FT4=0.99.  10/06/2023:  TSH=2.201.  FT4=0.66. 01/20/2024:  TSH=2.344.  FT4=1.26 (0.61-1.12). 04/08/2024: TSH = 3.108. FT4 = 2.02.  K/Cr/Ca = 3.9/0.90/7.9, eGFR = >60. 9/12/20225: Thyroid  u/s [facility]) with multiple nodules. None have significantly changed.   Assessment/Plan: 1.  Multinodular Goiter.  U/s in 6/22 showed multiple nodules so FNA was done which returned benign.  Repeat u/s in 9/24 showed some of the nodules may have enlarged  some as compared to 7/23.  Given previous negative FNA and heterogenous nature I chose to follow them. Her most recent u/s in 9/25 showed no significant change.  I will continue to follow. I will plan a repeat u/s in a year.  2.  Hyperthyroidism.  TSH was suppressed with an elevated FT4 in 3/23, 5/23, in 7/23 and again in 9/23.  We previously discussed that the amiodarone  may have played a role in her hyperthyroidism.  Off amiodarone , her TSH normalized so I weaned her methimazole  in 12/24 however, TSH again became suppressed so I had her go back on 2.5 mg MMZ.  Her TSH was nl in 9/25 on 2.5 mg daily.  Since cardiology is  restarting her amiodarone , they increased her dose of methimazole  to 10 mg daily.  Her T4 was elevated but I told her that it is a less reliable test than the TSH.  Given that she is restarting amiodarone , I will leave her on 10 mg of methimazole  daily recheck TFTs in a month.   3.  Afib/flutter.  She has a hx of A fib/flutter and was started on amiodarone  in early 2022.  When she was found to be hyperthyroid, her cardiologist discontinued the amiodarone  in 3/23.  In 9/25, cardiology restarted her back on amiodarone .  4.  RTC in 4 months.  She will have a lab only visit in 1 month.    06/02/24:  TSH=4.06.  Will leave dose at 10mg  daily for now  This note is partially prepared by Earla Daria Messier, Scribe, in the presence of and acting as the scribe of Dr. Debby Breaker , MD.     [facility], MD

## 2024-04-13 DIAGNOSIS — I4892 Unspecified atrial flutter: Secondary | ICD-10-CM | POA: Diagnosis not present

## 2024-04-13 DIAGNOSIS — I959 Hypotension, unspecified: Secondary | ICD-10-CM | POA: Diagnosis not present

## 2024-04-13 DIAGNOSIS — I119 Hypertensive heart disease without heart failure: Secondary | ICD-10-CM | POA: Diagnosis not present

## 2024-04-13 DIAGNOSIS — E0520 Thyrotoxicosis with toxic multinodular goiter without thyrotoxic crisis or storm: Secondary | ICD-10-CM | POA: Diagnosis not present

## 2024-04-13 DIAGNOSIS — T462X5S Adverse effect of other antidysrhythmic drugs, sequela: Secondary | ICD-10-CM | POA: Diagnosis not present

## 2024-04-13 DIAGNOSIS — I4819 Other persistent atrial fibrillation: Secondary | ICD-10-CM | POA: Diagnosis not present

## 2024-04-14 ENCOUNTER — Telehealth: Payer: Self-pay | Admitting: Cardiology

## 2024-04-14 ENCOUNTER — Encounter: Payer: Self-pay | Admitting: Cardiology

## 2024-04-14 ENCOUNTER — Ambulatory Visit: Attending: Cardiology | Admitting: Cardiology

## 2024-04-14 VITALS — BP 110/57 | HR 54 | Ht 63.0 in | Wt 124.4 lb

## 2024-04-14 DIAGNOSIS — I4819 Other persistent atrial fibrillation: Secondary | ICD-10-CM | POA: Diagnosis not present

## 2024-04-14 NOTE — Patient Instructions (Signed)
 Medication Instructions:  Your physician recommends that you continue on your current medications as directed. Please refer to the Current Medication list given to you today.   *If you need a refill on your cardiac medications before your next appointment, please call your pharmacy*  Lab Work: No labs ordered today  If you have labs (blood work) drawn today and your tests are completely normal, you will receive your results only by: MyChart Message (if you have MyChart) OR A paper copy in the mail If you have any lab test that is abnormal or we need to change your treatment, we will call you to review the results.  Testing/Procedures: No test ordered today   Follow-Up: At [facility], you and your health needs are our priority.  As part of our continuing mission to provide you with exceptional heart care, our providers are all part of one team.  This team includes your primary Cardiologist (physician) and Advanced Practice Providers or APPs (Physician Assistants and Nurse Practitioners) who all work together to provide you with the care you need, when you need it.  Your next appointment:   6 month(s)  Provider:   You may see Redell Cave, MD or one of the following Advanced Practice Providers on your designated Care Team:   Lonni Meager, NP Lesley Maffucci, PA-C Bernardino Bring, PA-C Cadence Salida, PA-C Tylene Lunch, NP Barnie Hila, NP    We recommend signing up for the patient portal called MyChart.  Sign up information is provided on this After Visit Summary.  MyChart is used to connect with patients for Virtual Visits (Telemedicine).  Patients are able to view lab/test results, encounter notes, upcoming appointments, etc.  Non-urgent messages can be sent to your provider as well.   To learn more about what you can do with MyChart, go to [email or web address].   Other Instructions Follow up with EP in 2 months

## 2024-04-14 NOTE — Telephone Encounter (Signed)
 Home health nurse calling to report level 2 interaction between   amiodarone  (PACERONE ) 200 MG tablet  And   Digoxin  62.5 MCG TABS   [facility] Social Work.  Please advise.

## 2024-04-14 NOTE — Progress Notes (Signed)
 Cardiology Office Note:    Date:  04/14/2024   ID:  Kelli Smith, DOB Jun 29, 1941, MRN [id number]  PCP:  Pcp, No  Cardiologist:  Redell Cave, MD  Electrophysiologist:  OLE ONEIDA HOLTS, MD   Referring MD: No ref. provider found   Chief Complaint  Patient presents with   Follow-up    1 month follow up after Echo new on set AFib pt has been doing well with no complaints of chest pain, chest pressure or SOB, medciation reviewed verbally with patient    History of Present Illness:    Kelli Smith is a 83 y.o. female with a hx of arthritis, paroxysmal A. fib/flutter s/p DCCV x 2 on 10/2020 , 01/2024 who presents for follow-up.    Last seen with A-fib RVR, evaluated by EP, medication side effects..  Started on Toprol  XL.  Presented to the ED with A-fib RVR, started on amiodarone  loading with conversion to sinus rhythm.  Switch to p.o. amiodarone  with taper upon discharge. digoxin  and metoprolol  also added.  Patient states feeling much better, denies fatigue or palpitations.  Tolerating all medications as prescribed.   Prior notes TEE 10/2020 EF 50% Echocardiogram 08/2019 normal systolic function, mild LVH, EF 60 to 65%. Cardiac monitor on 09/2019 reviewed again showing paroxysmal atrial fibrillation. Amiodarone  previously stopped due to thyroid  dysfunction.   Past Medical History:  Diagnosis Date   A-fib [facility])    a.) CHA2DS2-VASc = 5 (age x 2, sex, HTN, aortic plaque). b.) s/p DCCV (200J x 2) 11/07/2020. c.) rate/rhythm maintained on oral amiodarone ; chronically anticoagulated using rivaroxaban .   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/2020   B12 deficiency    Basal cell carcinoma    CAD (coronary artery disease)    CAD (coronary artery disease) 11/22/2020   Cyst of right kidney    DDD (degenerative disc disease), lumbar    Hematuria  04/05/2020   History of chicken pox    History of kidney stones    HTN (hypertension)    Knee pain, bilateral 06/11/2018   Long term current use of anticoagulant    a.) rivaroxaban    Memory loss    Microcytosis    Osteopenia    PAC (premature atrial contraction)    Pain of right heel 04/05/2020   Premature atrial contraction 09/20/2019   Primary osteoarthritis of both knees 09/30/2020   Right shoulder pain 06/11/2018   Thyromegaly    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    Past Surgical History:  Procedure Laterality Date   BUNIONECTOMY WITH HAMMERTOE RECONSTRUCTION  2009   CARDIOVERSION N/A 11/07/2020   Procedure: CARDIOVERSION (200J x 2); Location: [facility]; Surgeon: Redell Cave, MD   CARDIOVERSION N/A 02/01/2024   Procedure: CARDIOVERSION;  Surgeon: Cave Redell, MD;  Location: [facility] ORS;  Service: Cardiovascular;  Laterality: N/A;   CATARACT EXTRACTION Bilateral    2013/2014   CESAREAN SECTION N/A 1978   KNEE ARTHROPLASTY Left 12/12/2020   Procedure: COMPUTER ASSISTED TOTAL KNEE ARTHROPLASTY;  Surgeon: Mardee Lynwood SQUIBB, MD;  Location: [facility] ORS;  Service: Orthopedics;  Laterality: Left;   KNEE ARTHROPLASTY Right 10/28/2021   Procedure: COMPUTER ASSISTED TOTAL KNEE ARTHROPLASTY;  Surgeon: Mardee Lynwood SQUIBB, MD;  Location: [facility] ORS;  Service: Orthopedics;  Laterality: Right;   TEE WITHOUT CARDIOVERSION N/A 11/07/2020   Procedure: TRANSESOPHAGEAL ECHOCARDIOGRAM (TEE);  Surgeon: Darliss Rogue, MD;  Location: [facility] ORS;  Service: Cardiovascular;  Laterality: N/A;    Current Medications: Current Meds  Medication Sig   amiodarone  (PACERONE ) 200 MG tablet Take 2 tablets (400 mg total) by mouth 2 (two) times daily for 7 days, THEN 1 tablet (200 mg total) 2 (two) times daily for 7 days, THEN 1 tablet (200 mg total) daily.   Ascorbic Acid  (VITAMIN C  PO) Take 500 mg by mouth daily.   Calcium  Carbonate-Vit D-Min (CALCIUM  600+D3 PLUS MINERALS PO) Take 600 mg by mouth 2 (two)  times daily.   Digoxin  62.5 MCG TABS Take 1 tablet (0.0625 mg) by mouth every other day.   methimazole  (TAPAZOLE ) 10 MG tablet Take 1 tablet (10 mg total) by mouth daily.   metoprolol  tartrate (LOPRESSOR ) 25 MG tablet Take 1 tablet (25 mg total) by mouth 2 (two) times daily.   Multiple Vitamins-Minerals (MULTIVITAMIN WITH MINERALS) tablet Take 1 tablet by mouth every other day.   Rivaroxaban  (XARELTO ) 15 MG TABS tablet Take 1 tablet (15 mg total) by mouth daily with supper.     Allergies:   Patient has no known allergies.   Social History   Socioeconomic History   Marital status: Widowed    Spouse name: Not on file   Number of children: Not on file   Years of education: Not on file   Highest education level: Bachelor's degree (e.g., BA, AB, BS)  Occupational History   Not on file  Tobacco Use   Smoking status: Never   Smokeless tobacco: Never   Tobacco comments:    Never smoked 04/04/24  Vaping Use   Vaping status: Never Used  Substance and Sexual Activity   Alcohol use: Not Currently   Drug use: Never   Sexual activity: Not on file  Other Topics Concern   Not on file  Social History Narrative   From [place] lived in [place]  since late 1990s early 2000    Lives with daughter    Automotive engineer ed    Former Runner, broadcasting/film/video    No guns, wears seat belt, safe in relationship    Widowed       2 daughters 1/2 in [employer]    Social Drivers of Health   Financial Resource Strain: Patient Declined (01/19/2024)   Overall Financial Resource Strain (CARDIA)    Difficulty of Paying Living Expenses: Patient declined  Food Insecurity: No Food Insecurity (04/04/2024)   Hunger Vital Sign    Worried About Running Out of Food in the Last Year: Never true    Ran Out of Food in the Last Year: Never true  Transportation Needs: No Transportation Needs (04/04/2024)   PRAPARE - Administrator, Civil Service (Medical): No    Lack of Transportation (Non-Medical): No  Physical Activity: Inactive  (01/19/2024)   Exercise Vital Sign    Days of Exercise per Week: 0 days    Minutes of Exercise per Session: Not on file  Stress: No Stress Concern Present (01/19/2024)   [employer] of Occupational Health - Occupational Stress Questionnaire    Feeling of Stress: Not at all  Social Connections: Socially Isolated (04/04/2024)   Social Connection and Isolation Panel    Frequency of Communication with Friends and Family: Twice a week    Frequency of Social Gatherings with Friends and Family: Never    Attends Religious Services: More than 4 times per year    Active Member of [employer] or Organizations: No    Attends [employer]  or Organization Meetings: Never    Marital Status: Widowed     Family History: The patient's family history includes Alzheimer's disease in her sister; Heart Problems in her mother; Heart disease in her father and mother.  ROS:   Please see the history of present illness.     All other systems reviewed and are negative.  EKGs/Labs/Other Studies Reviewed:    The following studies were reviewed today:   EKG Interpretation Date/Time:  Thursday April 14 2024 10:02:14 EDT Ventricular Rate:  54 PR Interval:  166 QRS Duration:  76 QT Interval:  502 QTC Calculation: 476 R Axis:   14  Text Interpretation: Sinus bradycardia ST & T wave abnormality, consider anterior ischemia Confirmed by Darliss Rogue ([id number]) on 04/14/2024 10:10:05 AM    Recent Labs: 01/20/2024: ALT 37 04/04/2024: B Natriuretic Peptide 614.7 04/08/2024: TSH 3.108 04/09/2024: BUN 15; Creatinine, Ser 0.79; Hemoglobin 11.5; Magnesium  2.1; Platelets 126; Potassium 3.4; Sodium 137  Recent Lipid Panel    Component Value Date/Time   CHOL 159 11/03/2022 0828   TRIG 80.0 11/03/2022 0828   HDL 56.20 11/03/2022 0828   CHOLHDL 3 11/03/2022 0828   VLDL 16.0 11/03/2022 0828   LDLCALC 86 11/03/2022 0828    Physical Exam:    VS:  BP (!) 110/57 (BP Location: Left Arm, Patient Position: Sitting, Cuff Size:  Normal)   Pulse (!) 54   Ht 5' 3 (1.6 m)   Wt 124 lb 6.4 oz (56.4 kg)   SpO2 94%   BMI 22.04 kg/m     Wt Readings from Last 3 Encounters:  04/14/24 124 lb 6.4 oz (56.4 kg)  04/04/24 120 lb 2.4 oz (54.5 kg)  04/04/24 116 lb 9.6 oz (52.9 kg)     GEN:  Well nourished, well developed in no acute distress HEENT: Normal NECK: No JVD; No carotid bruits CARDIAC: Irregular irregular, tachycardic RESPIRATORY:  Clear to auscultation without rales, wheezing or rhonchi  ABDOMEN: Soft, non-tender, non-distended MUSCULOSKELETAL:  No edema; No deformity  SKIN: Warm and dry NEUROLOGIC:  Alert and oriented x 3 PSYCHIATRIC:  Normal affect   ASSESSMENT:    1. Persistent atrial fibrillation (HCC)    PLAN:    In order of problems listed above:  Persistent A-fib/flutter s/p DC cardioversion x 2 in 10/2020, 01/2024.  Recent admission for A-fib RVR.  Evaluated by EP, amiodarone  loaded, transition to oral amiodarone  with taper upon discharge.  Maintaining sinus rhythm, EKG today sinus bradycardia heart rate 54.  Continue Amio 400 twice daily x 2 more days, continue taper schedule weekly to continue 200 mg daily.  Continue Lopressor  25 mg twice daily, digoxin  0.0625 mg every other day, Xarelto  15 mg daily.  Appreciate input from EP.  Keep appointment with EP/A-fib clinic.  Echo 8/25 EF 55 to 60%.  Tikosyn  will be considered if patient is intolerant to Amiodarone .  Follow-up 6 months.       Medication Adjustments/Labs and Tests Ordered: Current medicines are reviewed at length with the patient today.  Concerns regarding medicines are outlined above.  Orders Placed This Encounter  Procedures   EKG 12-Lead    No orders of the defined types were placed in this encounter.    Patient Instructions  Medication Instructions:  Your physician recommends that you continue on your current medications as directed. Please refer to the Current Medication list given to you today.   *If you need a refill on  your cardiac medications before your next appointment, please call your pharmacy*  Lab Work: No labs ordered today  If you have labs (blood work) drawn today and your tests are completely normal, you will receive your results only by: MyChart Message (if you have MyChart) OR A paper copy in the mail If you have any lab test that is abnormal or we need to change your treatment, we will call you to review the results.  Testing/Procedures: No test ordered today   Follow-Up: At [facility], you and your health needs are our priority.  As part of our continuing mission to provide you with exceptional heart care, our providers are all part of one team.  This team includes your primary Cardiologist (physician) and Advanced Practice Providers or APPs (Physician Assistants and Nurse Practitioners) who all work together to provide you with the care you need, when you need it.  Your next appointment:   6 month(s)  Provider:   You may see Redell Cave, MD or one of the following Advanced Practice Providers on your designated Care Team:   Lonni Meager, NP Lesley Maffucci, PA-C Bernardino Bring, PA-C Cadence Noank, PA-C Tylene Lunch, NP Barnie Hila, NP    We recommend signing up for the patient portal called MyChart.  Sign up information is provided on this After Visit Summary.  MyChart is used to connect with patients for Virtual Visits (Telemedicine).  Patients are able to view lab/test results, encounter notes, upcoming appointments, etc.  Non-urgent messages can be sent to your provider as well.   To learn more about what you can do with MyChart, go to [email or web address].   Other Instructions Follow up with EP in 2 months           Signed, Redell Cave, MD  04/14/2024 10:59 AM    [facility]

## 2024-04-15 ENCOUNTER — Ambulatory Visit (HOSPITAL_COMMUNITY): Admitting: Physician Assistant

## 2024-04-15 DIAGNOSIS — T462X5S Adverse effect of other antidysrhythmic drugs, sequela: Secondary | ICD-10-CM | POA: Diagnosis not present

## 2024-04-15 DIAGNOSIS — I959 Hypotension, unspecified: Secondary | ICD-10-CM | POA: Diagnosis not present

## 2024-04-15 DIAGNOSIS — E0520 Thyrotoxicosis with toxic multinodular goiter without thyrotoxic crisis or storm: Secondary | ICD-10-CM | POA: Diagnosis not present

## 2024-04-15 DIAGNOSIS — I119 Hypertensive heart disease without heart failure: Secondary | ICD-10-CM | POA: Diagnosis not present

## 2024-04-15 DIAGNOSIS — I4819 Other persistent atrial fibrillation: Secondary | ICD-10-CM | POA: Diagnosis not present

## 2024-04-15 DIAGNOSIS — I4892 Unspecified atrial flutter: Secondary | ICD-10-CM | POA: Diagnosis not present

## 2024-04-18 ENCOUNTER — Ambulatory Visit (INDEPENDENT_AMBULATORY_CARE_PROVIDER_SITE_OTHER): Admitting: Podiatry

## 2024-04-18 ENCOUNTER — Encounter: Payer: Self-pay | Admitting: Podiatry

## 2024-04-18 DIAGNOSIS — B351 Tinea unguium: Secondary | ICD-10-CM | POA: Diagnosis not present

## 2024-04-18 DIAGNOSIS — M79674 Pain in right toe(s): Secondary | ICD-10-CM | POA: Diagnosis not present

## 2024-04-18 DIAGNOSIS — M79675 Pain in left toe(s): Secondary | ICD-10-CM | POA: Diagnosis not present

## 2024-04-19 DIAGNOSIS — E0520 Thyrotoxicosis with toxic multinodular goiter without thyrotoxic crisis or storm: Secondary | ICD-10-CM | POA: Diagnosis not present

## 2024-04-19 DIAGNOSIS — T462X5S Adverse effect of other antidysrhythmic drugs, sequela: Secondary | ICD-10-CM | POA: Diagnosis not present

## 2024-04-19 DIAGNOSIS — I119 Hypertensive heart disease without heart failure: Secondary | ICD-10-CM | POA: Diagnosis not present

## 2024-04-19 DIAGNOSIS — I4892 Unspecified atrial flutter: Secondary | ICD-10-CM | POA: Diagnosis not present

## 2024-04-19 DIAGNOSIS — I959 Hypotension, unspecified: Secondary | ICD-10-CM | POA: Diagnosis not present

## 2024-04-19 DIAGNOSIS — I4819 Other persistent atrial fibrillation: Secondary | ICD-10-CM | POA: Diagnosis not present

## 2024-04-19 NOTE — Progress Notes (Signed)
  Subjective:  Patient ID: Kelli Smith, female    DOB: May 06, 1941,  MRN: [id number]  [name] presents to clinic today for painful mycotic toenails of both feet that are difficult to trim. Pain interferes with daily activities and wearing enclosed shoe gear comfortably.  Chief Complaint  Patient presents with   RFC     RFC non diabetic toenail trim. PCP appointment 04/27/24. 0 pain.   New problem(s): None.   PCP is Abbey Bruckner, MD. [place] 03/23/2024.  No Known Allergies  Review of Systems: Negative except as noted in the HPI.  Objective: No changes noted in today's physical examination. There were no vitals filed for this visit. [name] is a pleasant 83 y.o. female WD, WN in NAD. AAO x 3.  Vascular Examination: CFT immediate b/l LE. Palpable DP/PT pulses b/l LE. Digital hair sparse b/l. Skin temperature gradient WNL b/l. No pain with calf compression b/l. No edema noted b/l. No cyanosis or clubbing noted b/l LE.  Neurological Examination: Sensation grossly intact b/l with 10 gram monofilament. Vibratory sensation intact b/l.   Dermatological Examination: Pedal skin with normal turgor, texture and tone b/l. Toenails 1-5 b/l thick, discolored, elongated with subungual debris and pain on dorsal palpation.  Minimal hyperkeratos(is/es) noted distal tip of right 3rd toe and plantar IPJ of left great toe.  Musculoskeletal Examination: Muscle strength 5/5 to b/l LE. HAV with bunion bilaterally and hammertoes 2-5 b/l.  Radiographs: None  Assessment/Plan: 1. Pain due to onychomycosis of toenails of both feet   Consent given for treatment. Patient examined. All patient's and/or POA's questions/concerns addressed on today's visit. Mycotic toenails 1-5 debrided in length and girth without incident. Continue soft, supportive shoe gear daily. Report any pedal injuries to medical professional. Call office if there are any quesitons/concerns. -Patient/POA to call should there be  question/concern in the interim.   Return in about 3 months (around 07/18/2024).  Delon LITTIE Merlin, DPM      [facility] LOCATION: [address], [place] [address]                   Office [phone]   [facility] LOCATION: [address] [place], [place] [address] Office [phone]

## 2024-04-20 DIAGNOSIS — E0520 Thyrotoxicosis with toxic multinodular goiter without thyrotoxic crisis or storm: Secondary | ICD-10-CM | POA: Diagnosis not present

## 2024-04-20 DIAGNOSIS — T462X5S Adverse effect of other antidysrhythmic drugs, sequela: Secondary | ICD-10-CM | POA: Diagnosis not present

## 2024-04-20 DIAGNOSIS — I119 Hypertensive heart disease without heart failure: Secondary | ICD-10-CM | POA: Diagnosis not present

## 2024-04-20 DIAGNOSIS — I4892 Unspecified atrial flutter: Secondary | ICD-10-CM | POA: Diagnosis not present

## 2024-04-20 DIAGNOSIS — I4819 Other persistent atrial fibrillation: Secondary | ICD-10-CM | POA: Diagnosis not present

## 2024-04-20 DIAGNOSIS — I959 Hypotension, unspecified: Secondary | ICD-10-CM | POA: Diagnosis not present

## 2024-04-21 DIAGNOSIS — I119 Hypertensive heart disease without heart failure: Secondary | ICD-10-CM | POA: Diagnosis not present

## 2024-04-21 DIAGNOSIS — E0520 Thyrotoxicosis with toxic multinodular goiter without thyrotoxic crisis or storm: Secondary | ICD-10-CM | POA: Diagnosis not present

## 2024-04-21 DIAGNOSIS — I4819 Other persistent atrial fibrillation: Secondary | ICD-10-CM | POA: Diagnosis not present

## 2024-04-21 DIAGNOSIS — T462X5S Adverse effect of other antidysrhythmic drugs, sequela: Secondary | ICD-10-CM | POA: Diagnosis not present

## 2024-04-21 DIAGNOSIS — I959 Hypotension, unspecified: Secondary | ICD-10-CM | POA: Diagnosis not present

## 2024-04-21 DIAGNOSIS — I4892 Unspecified atrial flutter: Secondary | ICD-10-CM | POA: Diagnosis not present

## 2024-04-26 DIAGNOSIS — I4819 Other persistent atrial fibrillation: Secondary | ICD-10-CM | POA: Diagnosis not present

## 2024-04-26 DIAGNOSIS — T462X5S Adverse effect of other antidysrhythmic drugs, sequela: Secondary | ICD-10-CM | POA: Diagnosis not present

## 2024-04-26 DIAGNOSIS — I4892 Unspecified atrial flutter: Secondary | ICD-10-CM | POA: Diagnosis not present

## 2024-04-26 DIAGNOSIS — E0520 Thyrotoxicosis with toxic multinodular goiter without thyrotoxic crisis or storm: Secondary | ICD-10-CM | POA: Diagnosis not present

## 2024-04-26 DIAGNOSIS — I119 Hypertensive heart disease without heart failure: Secondary | ICD-10-CM | POA: Diagnosis not present

## 2024-04-26 DIAGNOSIS — I959 Hypotension, unspecified: Secondary | ICD-10-CM | POA: Diagnosis not present

## 2024-04-27 ENCOUNTER — Encounter: Payer: Self-pay | Admitting: Nurse Practitioner

## 2024-04-27 ENCOUNTER — Ambulatory Visit (INDEPENDENT_AMBULATORY_CARE_PROVIDER_SITE_OTHER): Admitting: Nurse Practitioner

## 2024-04-27 VITALS — BP 112/62 | HR 65 | Temp 97.9°F | Resp 14 | Ht 63.0 in | Wt 116.3 lb

## 2024-04-27 DIAGNOSIS — E042 Nontoxic multinodular goiter: Secondary | ICD-10-CM | POA: Diagnosis not present

## 2024-04-27 DIAGNOSIS — I959 Hypotension, unspecified: Secondary | ICD-10-CM | POA: Diagnosis not present

## 2024-04-27 DIAGNOSIS — M5136 Other intervertebral disc degeneration, lumbar region with discogenic back pain only: Secondary | ICD-10-CM | POA: Diagnosis not present

## 2024-04-27 DIAGNOSIS — D509 Iron deficiency anemia, unspecified: Secondary | ICD-10-CM

## 2024-04-27 DIAGNOSIS — F03A0 Unspecified dementia, mild, without behavioral disturbance, psychotic disturbance, mood disturbance, and anxiety: Secondary | ICD-10-CM | POA: Insufficient documentation

## 2024-04-27 DIAGNOSIS — R4189 Other symptoms and signs involving cognitive functions and awareness: Secondary | ICD-10-CM | POA: Diagnosis not present

## 2024-04-27 DIAGNOSIS — M1990 Unspecified osteoarthritis, unspecified site: Secondary | ICD-10-CM | POA: Diagnosis not present

## 2024-04-27 DIAGNOSIS — I4811 Longstanding persistent atrial fibrillation: Secondary | ICD-10-CM | POA: Diagnosis not present

## 2024-04-27 DIAGNOSIS — R630 Anorexia: Secondary | ICD-10-CM | POA: Diagnosis not present

## 2024-04-27 DIAGNOSIS — E0590 Thyrotoxicosis, unspecified without thyrotoxic crisis or storm: Secondary | ICD-10-CM

## 2024-04-27 DIAGNOSIS — I4819 Other persistent atrial fibrillation: Secondary | ICD-10-CM | POA: Diagnosis not present

## 2024-04-27 DIAGNOSIS — T462X5S Adverse effect of other antidysrhythmic drugs, sequela: Secondary | ICD-10-CM | POA: Diagnosis not present

## 2024-04-27 DIAGNOSIS — M8580 Other specified disorders of bone density and structure, unspecified site: Secondary | ICD-10-CM | POA: Diagnosis not present

## 2024-04-27 DIAGNOSIS — I119 Hypertensive heart disease without heart failure: Secondary | ICD-10-CM | POA: Diagnosis not present

## 2024-04-27 DIAGNOSIS — I4892 Unspecified atrial flutter: Secondary | ICD-10-CM | POA: Diagnosis not present

## 2024-04-27 DIAGNOSIS — I700 Atherosclerosis of aorta: Secondary | ICD-10-CM | POA: Diagnosis not present

## 2024-04-27 DIAGNOSIS — E0520 Thyrotoxicosis with toxic multinodular goiter without thyrotoxic crisis or storm: Secondary | ICD-10-CM | POA: Diagnosis not present

## 2024-04-27 LAB — COMPREHENSIVE METABOLIC PANEL WITH GFR
AG Ratio: 1.3 (calc) (ref 1.0–2.5)
ALT: 11 U/L (ref 6–29)
AST: 14 U/L (ref 10–35)
Albumin: 3.5 g/dL — ABNORMAL LOW (ref 3.6–5.1)
Alkaline phosphatase (APISO): 94 U/L (ref 37–153)
BUN/Creatinine Ratio: 28 (calc) — ABNORMAL HIGH (ref 6–22)
BUN: 16 mg/dL (ref 7–25)
CO2: 27 mmol/L (ref 20–32)
Calcium: 9.7 mg/dL (ref 8.6–10.4)
Chloride: 105 mmol/L (ref 98–110)
Creat: 0.58 mg/dL — ABNORMAL LOW (ref 0.60–0.95)
Globulin: 2.8 g/dL (ref 1.9–3.7)
Glucose, Bld: 92 mg/dL (ref 65–99)
Potassium: 4.2 mmol/L (ref 3.5–5.3)
Sodium: 140 mmol/L (ref 135–146)
Total Bilirubin: 0.6 mg/dL (ref 0.2–1.2)
Total Protein: 6.3 g/dL (ref 6.1–8.1)
eGFR: 90 mL/min/1.73m2 (ref >=60)

## 2024-04-27 LAB — IRON,TIBC AND FERRITIN PANEL
%SAT: 25 % (ref 16–45)
Ferritin: 224 ng/mL (ref 16–288)
Iron: 62 ug/dL (ref 45–160)
TIBC: 252 ug/dL (ref 250–450)

## 2024-04-27 LAB — CBC WITH DIFFERENTIAL/PLATELET
Absolute Lymphocytes: 2022 {cells}/uL (ref 850–3900)
Absolute Monocytes: 745 {cells}/uL (ref 200–950)
Basophils Absolute: 53 {cells}/uL (ref 0–200)
Basophils Relative: 0.7 %
Eosinophils Absolute: 479 {cells}/uL (ref 15–500)
Eosinophils Relative: 6.3 %
HCT: 37.7 % (ref 35.0–45.0)
Hemoglobin: 11.7 g/dL (ref 11.7–15.5)
MCH: 23.9 pg — ABNORMAL LOW (ref 27.0–33.0)
MCHC: 31 g/dL — ABNORMAL LOW (ref 32.0–36.0)
MCV: 76.9 fL — ABNORMAL LOW (ref 80.0–100.0)
MPV: 11.9 fL (ref 7.5–12.5)
Monocytes Relative: 9.8 %
Neutro Abs: 4302 {cells}/uL (ref 1500–7800)
Neutrophils Relative %: 56.6 %
Platelets: 324 Thousand/uL (ref 140–400)
RBC: 4.9 Million/uL (ref 3.80–5.10)
RDW: 18 % — ABNORMAL HIGH (ref 11.0–15.0)
Total Lymphocyte: 26.6 %
WBC: 7.6 Thousand/uL (ref 3.8–10.8)

## 2024-04-27 MED ORDER — MIRTAZAPINE 15 MG PO TBDP: 15.0000 mg | ORAL_TABLET | Freq: Every day | ORAL | 1 refills | Status: AC

## 2024-04-27 NOTE — Progress Notes (Signed)
 BP 112/62   Pulse 65   Temp 97.9 F (36.6 C)   Resp 14   Ht 5' 3 (1.6 m)   Wt 116 lb 4.8 oz (52.8 kg)   BMI 20.60 kg/m    Subjective:    Patient ID: Kelli Smith, female    DOB: 25-Apr-1941, 83 y.o.   MRN: [id number]  HPI: Kelli Smith is a 83 y.o. female  Chief Complaint  Patient presents with   Establish Care    Specialist: cardio, endo   Weight Loss    Concerned, states not hungry, wants to see about getting appetite stimulator?   paperwork    For aide and possible home health aid   Discussed the use of AI scribe software for clinical note transcription with the patient, who gave verbal consent to proceed.  History of Present Illness Kelli Smith is an 83 year old female who presents to establish care. She is accompanied by her daughter.  Unintentional weight loss and decreased appetite - Weight decreased from 124 pounds in September to 116 pounds recently - Decreased appetite and sometimes does not feel hungry - Eats small portions and sometimes resists eating more - No pain when eating  Cognitive impairment and memory loss - History of dementia - Memory loss and confusion, particularly during hospitalizations - Episodes of confusion during urinary tract infections - Previously on Aricept , discontinued due to adverse reaction - Family history of Alzheimer's disease in older sister - No longer drives due to getting lost  Atrial fibrillation and anticoagulation - Managed by cardiology - Current medications: amiodarone  200 mg daily, metoprolol  25 mg twice daily, Xarelto  15 mg daily - No chest pain, shortness of breath, or palpitations reported  Hyperthyroidism and multinodular thyroid  - Managed by endocrinology - Current medication: methimazole  10 mg daily - Last TSH within normal limits  Iron  deficiency anemia - History of iron  deficiency anemia - Previously on iron  supplements, not currently taking them - Recent blood work showed low hemoglobin and  hematocrit levels  Visual impairment - No longer drives due to cataracts  Musculoskeletal pain and degeneration - History of degenerative disc disease, lumbar osteoarthritis, and osteopenia  History of basal cell carcinoma - History of basal cell carcinoma         04/27/2024    1:05 PM 03/23/2024    4:11 PM 01/20/2024    2:07 PM  Depression screen PHQ 2/9  Decreased Interest 0 2 1  Down, Depressed, Hopeless 0 1 1  PHQ - 2 Score 0 3 2  Altered sleeping 0 0 1  Tired, decreased energy 0 3 1  Change in appetite 0 3 0  Feeling bad or failure about yourself  0 0 0  Trouble concentrating 0 0 0  Moving slowly or fidgety/restless 0 0 0  Suicidal thoughts 0 0 0  PHQ-9 Score 0 9 4  Difficult doing work/chores Not difficult at all Somewhat difficult Not difficult at all    Relevant past medical, surgical, family and social history reviewed and updated as indicated. Interim medical history since our last visit reviewed. Allergies and medications reviewed and updated.  Review of Systems  Ten systems reviewed and is negative except as mentioned in HPI      Objective:      BP 112/62   Pulse 65   Temp 97.9 F (36.6 C)   Resp 14   Ht 5' 3 (1.6 m)   Wt 116 lb 4.8 oz (52.8 kg)   BMI 20.60  kg/m    Wt Readings from Last 3 Encounters:  04/27/24 116 lb 4.8 oz (52.8 kg)  04/14/24 124 lb 6.4 oz (56.4 kg)  04/04/24 120 lb 2.4 oz (54.5 kg)    Physical Exam MEASUREMENTS: Weight- 116. GENERAL: Alert, cooperative, well developed, no acute distress. HEENT: Normocephalic, normal oropharynx, moist mucous membranes. CHEST: Clear to auscultation bilaterally, no wheezes, rhonchi, or crackles. CARDIOVASCULAR: Normal heart rate and rhythm, S1 and S2 normal without murmurs. ABDOMEN: Soft, non-tender, non-distended, without organomegaly, normal bowel sounds. EXTREMITIES: No cyanosis or edema. NEUROLOGICAL: Cranial nerves grossly intact, moves all extremities without gross motor or sensory  deficit, grip strength normal.  Results for orders placed or performed during the hospital encounter of 04/04/24  Basic metabolic panel   Collection Time: 04/04/24  9:48 AM  Result Value Ref Range   Sodium 146 (H) 135 - 145 mmol/L   Potassium 4.1 3.5 - 5.1 mmol/L   Chloride 104 98 - 111 mmol/L   CO2 25 22 - 32 mmol/L   Glucose, Bld 123 (H) 70 - 99 mg/dL   BUN 66 (H) 8 - 23 mg/dL   Creatinine, Ser 8.68 (H) 0.44 - 1.00 mg/dL   Calcium  10.5 (H) 8.9 - 10.3 mg/dL   GFR, Estimated 40 (L) >60 mL/min   Anion gap 17 (H) 5 - 15  CBC with Differential   Collection Time: 04/04/24  9:48 AM  Result Value Ref Range   WBC 9.1 4.0 - 10.5 K/uL   RBC 5.97 (H) 3.87 - 5.11 MIL/uL   Hemoglobin 14.2 12.0 - 15.0 g/dL   HCT 55.3 63.9 - 53.9 %   MCV 74.7 (L) 80.0 - 100.0 fL   MCH 23.8 (L) 26.0 - 34.0 pg   MCHC 31.8 30.0 - 36.0 g/dL   RDW 82.1 (H) 88.4 - 84.4 %   Platelets 188 150 - 400 K/uL   nRBC 1.1 (H) 0.0 - 0.2 %   Neutrophils Relative % 76 %   Neutro Abs 6.8 1.7 - 7.7 K/uL   Lymphocytes Relative 17 %   Lymphs Abs 1.6 0.7 - 4.0 K/uL   Monocytes Relative 7 %   Monocytes Absolute 0.7 0.1 - 1.0 K/uL   Eosinophils Relative 0 %   Eosinophils Absolute 0.0 0.0 - 0.5 K/uL   Basophils Relative 0 %   Basophils Absolute 0.0 0.0 - 0.1 K/uL   Immature Granulocytes 0 %   Abs Immature Granulocytes 0.02 0.00 - 0.07 K/uL  Magnesium    Collection Time: 04/04/24  9:48 AM  Result Value Ref Range   Magnesium  1.8 1.7 - 2.4 mg/dL  Troponin I (High Sensitivity)   Collection Time: 04/04/24  9:48 AM  Result Value Ref Range   Troponin I (High Sensitivity) 71 (H) <18 ng/L  Brain natriuretic peptide   Collection Time: 04/04/24 10:09 AM  Result Value Ref Range   B Natriuretic Peptide 614.7 (H) 0.0 - 100.0 pg/mL  TSH   Collection Time: 04/04/24 12:09 PM  Result Value Ref Range   TSH 1.659 0.350 - 4.500 uIU/mL  Troponin I (High Sensitivity)   Collection Time: 04/04/24 12:09 PM  Result Value Ref Range   Troponin  I (High Sensitivity) 56 (H) <18 ng/L  I-stat chem 8, ED   Collection Time: 04/04/24 12:22 PM  Result Value Ref Range   Sodium 146 (H) 135 - 145 mmol/L   Potassium 3.1 (L) 3.5 - 5.1 mmol/L   Chloride 109 98 - 111 mmol/L   BUN 56 (H) 8 -  23 mg/dL   Creatinine, Ser 8.79 (H) 0.44 - 1.00 mg/dL   Glucose, Bld 93 70 - 99 mg/dL   Calcium , Ion 1.14 (L) 1.15 - 1.40 mmol/L   TCO2 24 22 - 32 mmol/L   Hemoglobin 12.2 12.0 - 15.0 g/dL   HCT 63.9 63.9 - 53.9 %  I-Stat Lactic Acid   Collection Time: 04/04/24 12:22 PM  Result Value Ref Range   Lactic Acid, Venous 2.4 (HH) 0.5 - 1.9 mmol/L   Comment NOTIFIED PHYSICIAN   Basic metabolic panel   Collection Time: 04/04/24  3:59 PM  Result Value Ref Range   Sodium 142 135 - 145 mmol/L   Potassium 4.1 3.5 - 5.1 mmol/L   Chloride 107 98 - 111 mmol/L   CO2 22 22 - 32 mmol/L   Glucose, Bld 110 (H) 70 - 99 mg/dL   BUN 63 (H) 8 - 23 mg/dL   Creatinine, Ser 8.73 (H) 0.44 - 1.00 mg/dL   Calcium  9.2 8.9 - 10.3 mg/dL   GFR, Estimated 42 (L) >60 mL/min   Anion gap 13 5 - 15  Lactic acid, plasma   Collection Time: 04/04/24  3:59 PM  Result Value Ref Range   Lactic Acid, Venous 2.1 (HH) 0.5 - 1.9 mmol/L  Magnesium    Collection Time: 04/05/24  6:20 AM  Result Value Ref Range   Magnesium  2.3 1.7 - 2.4 mg/dL  Basic metabolic panel   Collection Time: 04/05/24  6:20 AM  Result Value Ref Range   Sodium 142 135 - 145 mmol/L   Potassium 3.8 3.5 - 5.1 mmol/L   Chloride 108 98 - 111 mmol/L   CO2 21 (L) 22 - 32 mmol/L   Glucose, Bld 139 (H) 70 - 99 mg/dL   BUN 57 (H) 8 - 23 mg/dL   Creatinine, Ser 8.88 (H) 0.44 - 1.00 mg/dL   Calcium  8.8 (L) 8.9 - 10.3 mg/dL   GFR, Estimated 49 (L) >60 mL/min   Anion gap 13 5 - 15  Glucose, capillary   Collection Time: 04/05/24  9:39 PM  Result Value Ref Range   Glucose-Capillary 125 (H) 70 - 99 mg/dL  Magnesium    Collection Time: 04/06/24  4:33 AM  Result Value Ref Range   Magnesium  1.9 1.7 - 2.4 mg/dL  Basic  metabolic panel   Collection Time: 04/06/24  4:33 AM  Result Value Ref Range   Sodium 141 135 - 145 mmol/L   Potassium 3.4 (L) 3.5 - 5.1 mmol/L   Chloride 108 98 - 111 mmol/L   CO2 22 22 - 32 mmol/L   Glucose, Bld 99 70 - 99 mg/dL   BUN 46 (H) 8 - 23 mg/dL   Creatinine, Ser 9.05 0.44 - 1.00 mg/dL   Calcium  8.4 (L) 8.9 - 10.3 mg/dL   GFR, Estimated >39 >39 mL/min   Anion gap 11 5 - 15  Heparin  level (unfractionated)   Collection Time: 04/06/24  4:33 AM  Result Value Ref Range   Heparin  Unfractionated 0.61 0.30 - 0.70 IU/mL  APTT   Collection Time: 04/06/24  4:33 AM  Result Value Ref Range   aPTT 42 (H) 24 - 36 seconds  CBC   Collection Time: 04/06/24  4:33 AM  Result Value Ref Range   WBC 9.3 4.0 - 10.5 K/uL   RBC 4.87 3.87 - 5.11 MIL/uL   Hemoglobin 11.5 (L) 12.0 - 15.0 g/dL   HCT 64.4 (L) 63.9 - 53.9 %   MCV 72.9 (L) 80.0 -  100.0 fL   MCH 23.6 (L) 26.0 - 34.0 pg   MCHC 32.4 30.0 - 36.0 g/dL   RDW 82.3 (H) 88.4 - 84.4 %   Platelets 121 (L) 150 - 400 K/uL   nRBC 0.4 (H) 0.0 - 0.2 %  Heparin  level (unfractionated)   Collection Time: 04/06/24  3:47 PM  Result Value Ref Range   Heparin  Unfractionated 0.65 0.30 - 0.70 IU/mL  APTT   Collection Time: 04/06/24  3:47 PM  Result Value Ref Range   aPTT 83 (H) 24 - 36 seconds  Magnesium    Collection Time: 04/07/24  6:59 AM  Result Value Ref Range   Magnesium  1.8 1.7 - 2.4 mg/dL  Basic metabolic panel   Collection Time: 04/07/24  6:59 AM  Result Value Ref Range   Sodium 137 135 - 145 mmol/L   Potassium 3.2 (L) 3.5 - 5.1 mmol/L   Chloride 104 98 - 111 mmol/L   CO2 24 22 - 32 mmol/L   Glucose, Bld 90 70 - 99 mg/dL   BUN 26 (H) 8 - 23 mg/dL   Creatinine, Ser 9.20 0.44 - 1.00 mg/dL   Calcium  8.0 (L) 8.9 - 10.3 mg/dL   GFR, Estimated >39 >39 mL/min   Anion gap 9 5 - 15  Heparin  level (unfractionated)   Collection Time: 04/07/24  6:59 AM  Result Value Ref Range   Heparin  Unfractionated 0.53 0.30 - 0.70 IU/mL  APTT    Collection Time: 04/07/24  6:59 AM  Result Value Ref Range   aPTT 93 (H) 24 - 36 seconds  CBC   Collection Time: 04/07/24  6:59 AM  Result Value Ref Range   WBC 8.1 4.0 - 10.5 K/uL   RBC 4.76 3.87 - 5.11 MIL/uL   Hemoglobin 11.2 (L) 12.0 - 15.0 g/dL   HCT 65.4 (L) 63.9 - 53.9 %   MCV 72.5 (L) 80.0 - 100.0 fL   MCH 23.5 (L) 26.0 - 34.0 pg   MCHC 32.5 30.0 - 36.0 g/dL   RDW 82.5 (H) 88.4 - 84.4 %   Platelets 113 (L) 150 - 400 K/uL   nRBC 0.2 0.0 - 0.2 %  Zinc    Collection Time: 04/07/24 10:27 AM  Result Value Ref Range   Zinc  63 44 - 115 ug/dL  Vitamin A    Collection Time: 04/07/24 10:27 AM  Result Value Ref Range   Vitamin A  (Retinoic Acid) 25.5 22.0 - 69.5 ug/dL  VITAMIN D  25 Hydroxy (Vit-D Deficiency, Fractures)   Collection Time: 04/07/24 10:27 AM  Result Value Ref Range   Vit D, 25-Hydroxy 88.54 30 - 100 ng/mL  Vitamin B12   Collection Time: 04/07/24 10:27 AM  Result Value Ref Range   Vitamin B-12 828 180 - 914 pg/mL  Basic metabolic panel   Collection Time: 04/08/24  4:20 AM  Result Value Ref Range   Sodium 135 135 - 145 mmol/L   Potassium 3.9 3.5 - 5.1 mmol/L   Chloride 107 98 - 111 mmol/L   CO2 16 (L) 22 - 32 mmol/L   Glucose, Bld 103 (H) 70 - 99 mg/dL   BUN 18 8 - 23 mg/dL   Creatinine, Ser 9.09 0.44 - 1.00 mg/dL   Calcium  7.9 (L) 8.9 - 10.3 mg/dL   GFR, Estimated >39 >39 mL/min   Anion gap 12 5 - 15  Magnesium    Collection Time: 04/08/24  4:20 AM  Result Value Ref Range   Magnesium  2.1 1.7 - 2.4 mg/dL  Heparin   level (unfractionated)   Collection Time: 04/08/24  4:23 AM  Result Value Ref Range   Heparin  Unfractionated 0.39 0.30 - 0.70 IU/mL  APTT   Collection Time: 04/08/24  4:23 AM  Result Value Ref Range   aPTT 74 (H) 24 - 36 seconds  CBC   Collection Time: 04/08/24  4:23 AM  Result Value Ref Range   WBC 9.8 4.0 - 10.5 K/uL   RBC 5.18 (H) 3.87 - 5.11 MIL/uL   Hemoglobin 12.3 12.0 - 15.0 g/dL   HCT 62.7 63.9 - 53.9 %   MCV 71.8 (L) 80.0 - 100.0  fL   MCH 23.7 (L) 26.0 - 34.0 pg   MCHC 33.1 30.0 - 36.0 g/dL   RDW 81.5 (H) 88.4 - 84.4 %   Platelets 127 (L) 150 - 400 K/uL   nRBC 0.2 0.0 - 0.2 %  TSH   Collection Time: 04/08/24  4:23 AM  Result Value Ref Range   TSH 3.108 0.350 - 4.500 uIU/mL  T4, free   Collection Time: 04/08/24  4:23 AM  Result Value Ref Range   Free T4 2.02 (H) 0.61 - 1.12 ng/dL  Heparin  level (unfractionated)   Collection Time: 04/09/24  5:25 AM  Result Value Ref Range   Heparin  Unfractionated 0.37 0.30 - 0.70 IU/mL  APTT   Collection Time: 04/09/24  5:25 AM  Result Value Ref Range   aPTT 110 (H) 24 - 36 seconds  CBC   Collection Time: 04/09/24  5:25 AM  Result Value Ref Range   WBC 7.7 4.0 - 10.5 K/uL   RBC 4.83 3.87 - 5.11 MIL/uL   Hemoglobin 11.5 (L) 12.0 - 15.0 g/dL   HCT 64.1 (L) 63.9 - 53.9 %   MCV 74.1 (L) 80.0 - 100.0 fL   MCH 23.8 (L) 26.0 - 34.0 pg   MCHC 32.1 30.0 - 36.0 g/dL   RDW 81.8 (H) 88.4 - 84.4 %   Platelets 126 (L) 150 - 400 K/uL   nRBC 0.0 0.0 - 0.2 %  Basic metabolic panel   Collection Time: 04/09/24  5:25 AM  Result Value Ref Range   Sodium 137 135 - 145 mmol/L   Potassium 3.4 (L) 3.5 - 5.1 mmol/L   Chloride 105 98 - 111 mmol/L   CO2 22 22 - 32 mmol/L   Glucose, Bld 73 70 - 99 mg/dL   BUN 15 8 - 23 mg/dL   Creatinine, Ser 9.20 0.44 - 1.00 mg/dL   Calcium  8.0 (L) 8.9 - 10.3 mg/dL   GFR, Estimated >39 >39 mL/min   Anion gap 10 5 - 15  Magnesium    Collection Time: 04/09/24  5:25 AM  Result Value Ref Range   Magnesium  2.1 1.7 - 2.4 mg/dL          Assessment & Plan:   Problem List Items Addressed This Visit       Cardiovascular and Mediastinum   Aortic atherosclerosis - Primary   RESOLVED: Longstanding persistent atrial fibrillation (HCC)     Endocrine   Multinodular thyroid    Hyperthyroidism     Nervous and Auditory   Mild dementia without behavioral disturbance, psychotic disturbance, mood disturbance, or anxiety, unspecified dementia type (HCC)    Relevant Medications   mirtazapine (REMERON SOL-TAB) 15 MG disintegrating tablet     Musculoskeletal and Integument   Osteoarthritis   Osteopenia   DDD (degenerative disc disease), lumbar   Other Visit Diagnoses       Cognitive decline  Relevant Orders   CBC with Differential/Platelet   Iron , TIBC and Ferritin Panel   Comprehensive metabolic panel with GFR   Ambulatory referral to Neurology     Decreased appetite       Relevant Medications   mirtazapine (REMERON SOL-TAB) 15 MG disintegrating tablet     Iron  deficiency anemia, unspecified iron  deficiency anemia type       Relevant Orders   CBC with Differential/Platelet   Iron , TIBC and Ferritin Panel   Comprehensive metabolic panel with GFR        Assessment and Plan Assessment & Plan Appetite loss and weight fluctuation Intermittent appetite loss and weight fluctuation with weight decrease from 124 lbs to 116 lbs. Potential causes include aging and muscle wasting. - Prescribe Remeron at bedtime to stimulate appetite and improve sleep. Discussed potential drowsiness as a side effect.  Cognitive impairment, possible dementia Cognitive impairment with possible dementia, history of memory loss and confusion, particularly during UTIs. Previous adverse reaction to Aricept . Potential link between weight loss and dementia. - Refer to neurology for evaluation and official diagnosis.  Iron  deficiency anemia Iron  deficiency anemia with recent low hemoglobin and hematocrit. Not currently on iron  supplementation. - Order CBC to assess current hemoglobin, hematocrit, and platelet levels.  Atrial fibrillation Chronic atrial fibrillation managed by cardiology. Currently on amiodarone , Xarelto , and metoprolol . - Cardiology follow-up scheduled in two months.  Hyperthyroidism with multinodular goiter Hyperthyroidism managed by endocrinology. Currently taking methimazole  10 mg daily. Last TSH was in normal range. - Endocrinology  follow-up scheduled with repeat blood work in a month.        Follow up plan: Return in about 4 weeks (around 05/25/2024) for follow up.

## 2024-04-28 ENCOUNTER — Ambulatory Visit: Payer: Self-pay | Admitting: Nurse Practitioner

## 2024-04-28 DIAGNOSIS — T462X5S Adverse effect of other antidysrhythmic drugs, sequela: Secondary | ICD-10-CM | POA: Diagnosis not present

## 2024-04-28 DIAGNOSIS — I959 Hypotension, unspecified: Secondary | ICD-10-CM | POA: Diagnosis not present

## 2024-04-28 DIAGNOSIS — I4892 Unspecified atrial flutter: Secondary | ICD-10-CM | POA: Diagnosis not present

## 2024-04-28 DIAGNOSIS — E0520 Thyrotoxicosis with toxic multinodular goiter without thyrotoxic crisis or storm: Secondary | ICD-10-CM | POA: Diagnosis not present

## 2024-04-28 DIAGNOSIS — I119 Hypertensive heart disease without heart failure: Secondary | ICD-10-CM | POA: Diagnosis not present

## 2024-04-28 DIAGNOSIS — I4819 Other persistent atrial fibrillation: Secondary | ICD-10-CM | POA: Diagnosis not present

## 2024-05-03 DIAGNOSIS — I959 Hypotension, unspecified: Secondary | ICD-10-CM | POA: Diagnosis not present

## 2024-05-03 DIAGNOSIS — I4892 Unspecified atrial flutter: Secondary | ICD-10-CM | POA: Diagnosis not present

## 2024-05-03 DIAGNOSIS — E0520 Thyrotoxicosis with toxic multinodular goiter without thyrotoxic crisis or storm: Secondary | ICD-10-CM | POA: Diagnosis not present

## 2024-05-03 DIAGNOSIS — I119 Hypertensive heart disease without heart failure: Secondary | ICD-10-CM | POA: Diagnosis not present

## 2024-05-03 DIAGNOSIS — I4819 Other persistent atrial fibrillation: Secondary | ICD-10-CM | POA: Diagnosis not present

## 2024-05-03 DIAGNOSIS — T462X5S Adverse effect of other antidysrhythmic drugs, sequela: Secondary | ICD-10-CM | POA: Diagnosis not present

## 2024-05-04 DIAGNOSIS — I119 Hypertensive heart disease without heart failure: Secondary | ICD-10-CM | POA: Diagnosis not present

## 2024-05-04 DIAGNOSIS — I4892 Unspecified atrial flutter: Secondary | ICD-10-CM | POA: Diagnosis not present

## 2024-05-04 DIAGNOSIS — I4819 Other persistent atrial fibrillation: Secondary | ICD-10-CM | POA: Diagnosis not present

## 2024-05-04 DIAGNOSIS — I959 Hypotension, unspecified: Secondary | ICD-10-CM | POA: Diagnosis not present

## 2024-05-04 DIAGNOSIS — E0520 Thyrotoxicosis with toxic multinodular goiter without thyrotoxic crisis or storm: Secondary | ICD-10-CM | POA: Diagnosis not present

## 2024-05-04 DIAGNOSIS — T462X5S Adverse effect of other antidysrhythmic drugs, sequela: Secondary | ICD-10-CM | POA: Diagnosis not present

## 2024-05-05 DIAGNOSIS — I959 Hypotension, unspecified: Secondary | ICD-10-CM | POA: Diagnosis not present

## 2024-05-05 DIAGNOSIS — I4819 Other persistent atrial fibrillation: Secondary | ICD-10-CM | POA: Diagnosis not present

## 2024-05-05 DIAGNOSIS — E0520 Thyrotoxicosis with toxic multinodular goiter without thyrotoxic crisis or storm: Secondary | ICD-10-CM | POA: Diagnosis not present

## 2024-05-05 DIAGNOSIS — I4892 Unspecified atrial flutter: Secondary | ICD-10-CM | POA: Diagnosis not present

## 2024-05-05 DIAGNOSIS — T462X5S Adverse effect of other antidysrhythmic drugs, sequela: Secondary | ICD-10-CM | POA: Diagnosis not present

## 2024-05-05 DIAGNOSIS — I119 Hypertensive heart disease without heart failure: Secondary | ICD-10-CM | POA: Diagnosis not present

## 2024-05-06 DIAGNOSIS — I4892 Unspecified atrial flutter: Secondary | ICD-10-CM | POA: Diagnosis not present

## 2024-05-06 DIAGNOSIS — E0520 Thyrotoxicosis with toxic multinodular goiter without thyrotoxic crisis or storm: Secondary | ICD-10-CM | POA: Diagnosis not present

## 2024-05-06 DIAGNOSIS — I119 Hypertensive heart disease without heart failure: Secondary | ICD-10-CM | POA: Diagnosis not present

## 2024-05-06 DIAGNOSIS — I959 Hypotension, unspecified: Secondary | ICD-10-CM | POA: Diagnosis not present

## 2024-05-06 DIAGNOSIS — T462X5S Adverse effect of other antidysrhythmic drugs, sequela: Secondary | ICD-10-CM | POA: Diagnosis not present

## 2024-05-06 DIAGNOSIS — I4819 Other persistent atrial fibrillation: Secondary | ICD-10-CM | POA: Diagnosis not present

## 2024-05-10 DIAGNOSIS — I4892 Unspecified atrial flutter: Secondary | ICD-10-CM | POA: Diagnosis not present

## 2024-05-10 DIAGNOSIS — I959 Hypotension, unspecified: Secondary | ICD-10-CM | POA: Diagnosis not present

## 2024-05-10 DIAGNOSIS — I119 Hypertensive heart disease without heart failure: Secondary | ICD-10-CM | POA: Diagnosis not present

## 2024-05-10 DIAGNOSIS — E0520 Thyrotoxicosis with toxic multinodular goiter without thyrotoxic crisis or storm: Secondary | ICD-10-CM | POA: Diagnosis not present

## 2024-05-10 DIAGNOSIS — T462X5S Adverse effect of other antidysrhythmic drugs, sequela: Secondary | ICD-10-CM | POA: Diagnosis not present

## 2024-05-10 DIAGNOSIS — I4819 Other persistent atrial fibrillation: Secondary | ICD-10-CM | POA: Diagnosis not present

## 2024-05-11 DIAGNOSIS — I119 Hypertensive heart disease without heart failure: Secondary | ICD-10-CM | POA: Diagnosis not present

## 2024-05-11 DIAGNOSIS — E0520 Thyrotoxicosis with toxic multinodular goiter without thyrotoxic crisis or storm: Secondary | ICD-10-CM | POA: Diagnosis not present

## 2024-05-11 DIAGNOSIS — I4892 Unspecified atrial flutter: Secondary | ICD-10-CM | POA: Diagnosis not present

## 2024-05-11 DIAGNOSIS — T462X5S Adverse effect of other antidysrhythmic drugs, sequela: Secondary | ICD-10-CM | POA: Diagnosis not present

## 2024-05-11 DIAGNOSIS — I4819 Other persistent atrial fibrillation: Secondary | ICD-10-CM | POA: Diagnosis not present

## 2024-05-11 DIAGNOSIS — I959 Hypotension, unspecified: Secondary | ICD-10-CM | POA: Diagnosis not present

## 2024-05-12 DIAGNOSIS — I3139 Other pericardial effusion (noninflammatory): Secondary | ICD-10-CM | POA: Diagnosis not present

## 2024-05-12 DIAGNOSIS — I119 Hypertensive heart disease without heart failure: Secondary | ICD-10-CM | POA: Diagnosis not present

## 2024-05-12 DIAGNOSIS — M1710 Unilateral primary osteoarthritis, unspecified knee: Secondary | ICD-10-CM | POA: Diagnosis not present

## 2024-05-12 DIAGNOSIS — E0520 Thyrotoxicosis with toxic multinodular goiter without thyrotoxic crisis or storm: Secondary | ICD-10-CM | POA: Diagnosis not present

## 2024-05-12 DIAGNOSIS — M47816 Spondylosis without myelopathy or radiculopathy, lumbar region: Secondary | ICD-10-CM | POA: Diagnosis not present

## 2024-05-12 DIAGNOSIS — I4819 Other persistent atrial fibrillation: Secondary | ICD-10-CM | POA: Diagnosis not present

## 2024-05-12 DIAGNOSIS — I4892 Unspecified atrial flutter: Secondary | ICD-10-CM | POA: Diagnosis not present

## 2024-05-12 DIAGNOSIS — Z86018 Personal history of other benign neoplasm: Secondary | ICD-10-CM | POA: Diagnosis not present

## 2024-05-12 DIAGNOSIS — E538 Deficiency of other specified B group vitamins: Secondary | ICD-10-CM | POA: Diagnosis not present

## 2024-05-12 DIAGNOSIS — M51369 Other intervertebral disc degeneration, lumbar region without mention of lumbar back pain or lower extremity pain: Secondary | ICD-10-CM | POA: Diagnosis not present

## 2024-05-12 DIAGNOSIS — D649 Anemia, unspecified: Secondary | ICD-10-CM | POA: Diagnosis not present

## 2024-05-12 DIAGNOSIS — E872 Acidosis, unspecified: Secondary | ICD-10-CM | POA: Diagnosis not present

## 2024-05-12 DIAGNOSIS — E43 Unspecified severe protein-calorie malnutrition: Secondary | ICD-10-CM | POA: Diagnosis not present

## 2024-05-12 DIAGNOSIS — D6869 Other thrombophilia: Secondary | ICD-10-CM | POA: Diagnosis not present

## 2024-05-12 DIAGNOSIS — F0390 Unspecified dementia without behavioral disturbance: Secondary | ICD-10-CM | POA: Diagnosis not present

## 2024-05-12 DIAGNOSIS — I491 Atrial premature depolarization: Secondary | ICD-10-CM | POA: Diagnosis not present

## 2024-05-12 DIAGNOSIS — Q6101 Congenital single renal cyst: Secondary | ICD-10-CM | POA: Diagnosis not present

## 2024-05-12 DIAGNOSIS — I2510 Atherosclerotic heart disease of native coronary artery without angina pectoris: Secondary | ICD-10-CM | POA: Diagnosis not present

## 2024-05-12 DIAGNOSIS — I700 Atherosclerosis of aorta: Secondary | ICD-10-CM | POA: Diagnosis not present

## 2024-05-12 DIAGNOSIS — I959 Hypotension, unspecified: Secondary | ICD-10-CM | POA: Diagnosis not present

## 2024-05-12 DIAGNOSIS — I083 Combined rheumatic disorders of mitral, aortic and tricuspid valves: Secondary | ICD-10-CM | POA: Diagnosis not present

## 2024-05-12 DIAGNOSIS — M19011 Primary osteoarthritis, right shoulder: Secondary | ICD-10-CM | POA: Diagnosis not present

## 2024-05-12 DIAGNOSIS — M8580 Other specified disorders of bone density and structure, unspecified site: Secondary | ICD-10-CM | POA: Diagnosis not present

## 2024-05-12 DIAGNOSIS — T462X5S Adverse effect of other antidysrhythmic drugs, sequela: Secondary | ICD-10-CM | POA: Diagnosis not present

## 2024-05-12 DIAGNOSIS — Z85828 Personal history of other malignant neoplasm of skin: Secondary | ICD-10-CM | POA: Diagnosis not present

## 2024-05-16 ENCOUNTER — Other Ambulatory Visit: Payer: Self-pay | Admitting: Cardiology

## 2024-05-18 DIAGNOSIS — I4819 Other persistent atrial fibrillation: Secondary | ICD-10-CM | POA: Diagnosis not present

## 2024-05-18 DIAGNOSIS — I119 Hypertensive heart disease without heart failure: Secondary | ICD-10-CM | POA: Diagnosis not present

## 2024-05-18 DIAGNOSIS — E0520 Thyrotoxicosis with toxic multinodular goiter without thyrotoxic crisis or storm: Secondary | ICD-10-CM | POA: Diagnosis not present

## 2024-05-18 DIAGNOSIS — T462X5S Adverse effect of other antidysrhythmic drugs, sequela: Secondary | ICD-10-CM | POA: Diagnosis not present

## 2024-05-18 DIAGNOSIS — I959 Hypotension, unspecified: Secondary | ICD-10-CM | POA: Diagnosis not present

## 2024-05-18 DIAGNOSIS — I4892 Unspecified atrial flutter: Secondary | ICD-10-CM | POA: Diagnosis not present

## 2024-05-19 ENCOUNTER — Telehealth: Payer: Self-pay | Admitting: Cardiology

## 2024-05-19 NOTE — Telephone Encounter (Signed)
 Caller Harriet) is following-up on faxed orders for patient's  Home Health services.

## 2024-05-20 NOTE — Telephone Encounter (Signed)
 Called Apolinar back and told her per Dr Darliss, he does not sign home health orders. She would need to be sent to patient's primary care provider to sign and fax back.

## 2024-05-26 ENCOUNTER — Ambulatory Visit: Admitting: Nurse Practitioner

## 2024-05-26 DIAGNOSIS — Z1231 Encounter for screening mammogram for malignant neoplasm of breast: Secondary | ICD-10-CM

## 2024-05-26 DIAGNOSIS — Z23 Encounter for immunization: Secondary | ICD-10-CM

## 2024-05-26 NOTE — Progress Notes (Deleted)
 There were no vitals taken for this visit.   Subjective:    Patient ID: Kelli Smith, female    DOB: 23-Oct-1940, 83 y.o.   MRN: [id number]  HPI: Kelli Smith is a 83 y.o. female presenting today for a 4 week follow-up. She is presenting today with her daughter. She has a history of dementia, hyperthyroidism, a-fib, aortic atherosclerosis, osteoarthritis, and degenerative disc disease. At her last appointment           04/27/2024    1:05 PM 03/23/2024    4:11 PM 01/20/2024    2:07 PM  Depression screen PHQ 2/9  Decreased Interest 0 2 1  Down, Depressed, Hopeless 0 1 1  PHQ - 2 Score 0 3 2  Altered sleeping 0 0 1  Tired, decreased energy 0 3 1  Change in appetite 0 3 0  Feeling bad or failure about yourself  0 0 0  Trouble concentrating 0 0 0  Moving slowly or fidgety/restless 0 0 0  Suicidal thoughts 0 0 0  PHQ-9 Score 0 9 4  Difficult doing work/chores Not difficult at all Somewhat difficult Not difficult at all    Relevant past medical, surgical, family and social history reviewed and updated as indicated. Interim medical history since our last visit reviewed. Allergies and medications reviewed and updated.  Review of Systems  Per HPI unless specifically indicated above     Objective:     There were no vitals taken for this visit.  {Vitals History (Optional):23777} Wt Readings from Last 3 Encounters:  04/27/24 116 lb 4.8 oz (52.8 kg)  04/14/24 124 lb 6.4 oz (56.4 kg)  04/04/24 120 lb 2.4 oz (54.5 kg)    Physical Exam   Results for orders placed or performed in visit on 04/27/24  CBC with Differential/Platelet   Collection Time: 04/27/24  1:52 PM  Result Value Ref Range   WBC 7.6 3.8 - 10.8 Thousand/uL   RBC 4.90 3.80 - 5.10 Million/uL   Hemoglobin 11.7 11.7 - 15.5 g/dL   HCT 62.2 64.9 - 54.9 %   MCV 76.9 (L) 80.0 - 100.0 fL   MCH 23.9 (L) 27.0 - 33.0 pg   MCHC 31.0 (L) 32.0 - 36.0 g/dL   RDW 81.9 (H) 88.9 - 84.9 %   Platelets 324 140 - 400  Thousand/uL   MPV 11.9 7.5 - 12.5 fL   Neutro Abs 4,302 1,500 - 7,800 cells/uL   Absolute Lymphocytes 2,022 850 - 3,900 cells/uL   Absolute Monocytes 745 200 - 950 cells/uL   Eosinophils Absolute 479 15 - 500 cells/uL   Basophils Absolute 53 0 - 200 cells/uL   Neutrophils Relative % 56.6 %   Total Lymphocyte 26.6 %   Monocytes Relative 9.8 %   Eosinophils Relative 6.3 %   Basophils Relative 0.7 %  Iron , TIBC and Ferritin Panel   Collection Time: 04/27/24  1:52 PM  Result Value Ref Range   Iron  62 45 - 160 mcg/dL   TIBC 747 749 - 549 mcg/dL (calc)   %SAT 25 16 - 45 % (calc)   Ferritin 224 16 - 288 ng/mL  Comprehensive metabolic panel with GFR   Collection Time: 04/27/24  1:52 PM  Result Value Ref Range   Glucose, Bld 92 65 - 99 mg/dL   BUN 16 7 - 25 mg/dL   Creat 9.41 (L) 9.39 - 0.95 mg/dL   eGFR 90 > OR = 60 fO/fpw/8.26f7   BUN/Creatinine Ratio 28 (H) 6 -  22 (calc)   Sodium 140 135 - 146 mmol/L   Potassium 4.2 3.5 - 5.3 mmol/L   Chloride 105 98 - 110 mmol/L   CO2 27 20 - 32 mmol/L   Calcium  9.7 8.6 - 10.4 mg/dL   Total Protein 6.3 6.1 - 8.1 g/dL   Albumin 3.5 (L) 3.6 - 5.1 g/dL   Globulin 2.8 1.9 - 3.7 g/dL (calc)   AG Ratio 1.3 1.0 - 2.5 (calc)   Total Bilirubin 0.6 0.2 - 1.2 mg/dL   Alkaline phosphatase (APISO) 94 37 - 153 U/L   AST 14 10 - 35 U/L   ALT 11 6 - 29 U/L   {Labs (Optional):23779}       Assessment & Plan:   Problem List Items Addressed This Visit   None Visit Diagnoses       Immunization due    -  Primary     Screening mammogram for breast cancer            Assessment and Plan         Follow up plan: No follow-ups on file.

## 2024-05-27 DIAGNOSIS — I4892 Unspecified atrial flutter: Secondary | ICD-10-CM | POA: Diagnosis not present

## 2024-05-27 DIAGNOSIS — I119 Hypertensive heart disease without heart failure: Secondary | ICD-10-CM | POA: Diagnosis not present

## 2024-05-27 DIAGNOSIS — E0520 Thyrotoxicosis with toxic multinodular goiter without thyrotoxic crisis or storm: Secondary | ICD-10-CM | POA: Diagnosis not present

## 2024-05-27 DIAGNOSIS — I959 Hypotension, unspecified: Secondary | ICD-10-CM | POA: Diagnosis not present

## 2024-05-27 DIAGNOSIS — T462X5S Adverse effect of other antidysrhythmic drugs, sequela: Secondary | ICD-10-CM | POA: Diagnosis not present

## 2024-05-27 DIAGNOSIS — I4819 Other persistent atrial fibrillation: Secondary | ICD-10-CM | POA: Diagnosis not present

## 2024-05-30 ENCOUNTER — Ambulatory Visit: Admitting: Cardiology

## 2024-06-01 DIAGNOSIS — I4892 Unspecified atrial flutter: Secondary | ICD-10-CM | POA: Diagnosis not present

## 2024-06-01 DIAGNOSIS — T462X5S Adverse effect of other antidysrhythmic drugs, sequela: Secondary | ICD-10-CM | POA: Diagnosis not present

## 2024-06-01 DIAGNOSIS — E0520 Thyrotoxicosis with toxic multinodular goiter without thyrotoxic crisis or storm: Secondary | ICD-10-CM | POA: Diagnosis not present

## 2024-06-01 DIAGNOSIS — I4819 Other persistent atrial fibrillation: Secondary | ICD-10-CM | POA: Diagnosis not present

## 2024-06-01 DIAGNOSIS — I959 Hypotension, unspecified: Secondary | ICD-10-CM | POA: Diagnosis not present

## 2024-06-01 DIAGNOSIS — I119 Hypertensive heart disease without heart failure: Secondary | ICD-10-CM | POA: Diagnosis not present

## 2024-06-02 DIAGNOSIS — E0590 Thyrotoxicosis, unspecified without thyrotoxic crisis or storm: Secondary | ICD-10-CM | POA: Diagnosis not present

## 2024-06-06 NOTE — Progress Notes (Signed)
 " Electrophysiology Office Note:   Date:  06/07/2024  ID:  Kelli Smith, DOB [birth year], MRN [id number]  Primary Cardiologist: Redell Cave, MD Electrophysiologist: Fonda Kitty, MD      History of Present Illness:   Kelli Smith is a 83 y.o. female with h/o persistent atrial fibrillation, atrial flutter, hypertension, hyperthyroidism, and aortic atherosclerosis who is being seen today for EP follow up.  Discussed the use of AI scribe software for clinical note transcription with the patient, who gave verbal consent to proceed.  History of Present Illness Kelli Smith is an 83 year old female with atrial fibrillation who presents for follow-up regarding her heart rhythm management. She was referred by Dr. Cindie for management of her atrial fibrillation.  She has a history of atrial fibrillation and was recently hospitalized for this condition. Currently, she is on amiodarone . She feels better and more like herself. No heart racing, pounding, or fluttering sensations are reported.  She has a history of thyroid  issues, which initially led to the avoidance of amiodarone . However, due to limited options, she is currently managed on amiodarone  while monitoring her thyroid  function. Recent labs were done to assess her thyroid  status, and she is on a stable dose of thyroid  medication.  Her current medications include amiodarone  and Xarelto  for stroke prophylaxis. She was previously on digoxin  and metoprolol , but these have been discontinued. Metoprolol  is now prescribed on an as-needed basis if her heart rate exceeds 100 bpm.  No trouble breathing or chest pain is reported. Her caregiver notes that she has perked up significantly since her hospitalization.   Review of systems complete and found to be negative unless listed in HPI.   EP Information / Studies Reviewed:    EKG is ordered today. Personal review as below.  EKG Interpretation Date/Time:  Tuesday June 07 2024 08:57:22  EST Ventricular Rate:  49 PR Interval:  224 QRS Duration:  84 QT Interval:  362 QTC Calculation: 327 R Axis:   -3  Text Interpretation: Sinus bradycardia with 1st degree A-V block Nonspecific T wave abnormality When compared with ECG of 14-Apr-2024 10:02, PR interval has increased ST no longer depressed in Anterior leads Nonspecific T wave abnormality has replaced inverted T waves in Anterior leads Prominent U wave Confirmed by Kitty Fonda [phone]) on 06/07/2024 9:03:41 AM   Echo 03/10/24:  1. Left ventricular ejection fraction, by estimation, is 55 to 60%. The  left ventricle has normal function. The left ventricle has no regional  wall motion abnormalities. There is mild left ventricular hypertrophy.  Left ventricular diastolic parameters  are indeterminate.   2. Right ventricular systolic function is normal. The right ventricular  size is normal. There is normal pulmonary artery systolic pressure. The  estimated right ventricular systolic pressure is 25.4 mmHg.   3. Left atrial size was moderately dilated.   4. A small pericardial effusion is present off the RV free wall and RA.   5. The mitral valve is normal in structure. Mild mitral valve  regurgitation. No evidence of mitral stenosis. Moderate mitral annular  calcification.   6. Tricuspid valve regurgitation is mild to moderate.   7. The aortic valve is normal in structure. Aortic valve regurgitation is  moderate. Aortic valve sclerosis/calcification is present, without any  evidence of aortic stenosis.   8. The inferior vena cava is normal in size with greater than 50%  respiratory variability, suggesting right atrial pressure of 3 mmHg.    Risk Assessment/Calculations:    CHA2DS2-VASc Score = 5  This indicates a 7.2% annual risk of stroke. The patient's score is based upon: CHF History: 0 HTN History: 1 Diabetes History: 0 Stroke History: 0 Vascular Disease History: 1 Age Score: 2 Gender Score: 1          Physical Exam:   VS:  BP 112/68 (BP Location: Left Arm, Patient Position: Sitting, Cuff Size: Normal)   Pulse (!) 49   Ht 5' 3 (1.6 m)   Wt 108 lb (49 kg)   BMI 19.13 kg/m    Wt Readings from Last 3 Encounters:  06/07/24 108 lb (49 kg)  04/27/24 116 lb 4.8 oz (52.8 kg)  04/14/24 124 lb 6.4 oz (56.4 kg)     General: Frail elderly female, in no acute distress.  Neck: No JVD.  Cardiac: Bradycardic, regular rhythm.  Resp: Normal work of breathing.  Ext: No edema.  Neuro: No gross focal deficits.  Psych: Normal affect.    ASSESSMENT AND PLAN:    #Persistent atrial fibrillation: Not a candidate for AF ablation.  Was unable to be loaded on Tikosyn  due to AKI.  Maintaining sinus on amiodarone . If ultimately amiodarone  is not tolerated, can stop this medication and plan for a permanent pacemaker implant followed by AV nodal ablation.  This is not being pursued at this time because I think she is a poor candidate with recent weight loss, low body weight, frailty. She has lost approximately 15 kg in the last 3 months. #High risk medication use: She is now on amiodarone . TSH normal 03/2024. LFTs normal 04/2024. #Sinus bradycardia: On amiodarone , metoprolol , and digoxin . #Hypercoagulable state due to AF -Continue amiodarone  200mg  once daily. Will return in 6 months for surveillance labs. If no recurrence then can consider decreasing to 100mg  once daily. -Stop digoxin .  -Transition metoprolol  to as needed for rates >100bpm. -Continue Xarelto .   #Hyperthyroidism #H/o thyroid  nodule - Contiue methimazole  10 mg on discharge. - Continue close follow-up with [facility] endocrinology.    Follow up with EP APP in 6 months  Signed, Fonda Kitty, MD  "

## 2024-06-07 ENCOUNTER — Ambulatory Visit: Attending: Cardiology | Admitting: Cardiology

## 2024-06-07 ENCOUNTER — Encounter: Payer: Self-pay | Admitting: Cardiology

## 2024-06-07 VITALS — BP 112/68 | HR 49 | Ht 63.0 in | Wt 108.0 lb

## 2024-06-07 DIAGNOSIS — Z79899 Other long term (current) drug therapy: Secondary | ICD-10-CM | POA: Insufficient documentation

## 2024-06-07 DIAGNOSIS — I4819 Other persistent atrial fibrillation: Secondary | ICD-10-CM | POA: Insufficient documentation

## 2024-06-07 DIAGNOSIS — D6869 Other thrombophilia: Secondary | ICD-10-CM | POA: Diagnosis not present

## 2024-06-07 DIAGNOSIS — E0590 Thyrotoxicosis, unspecified without thyrotoxic crisis or storm: Secondary | ICD-10-CM | POA: Diagnosis not present

## 2024-06-07 MED ORDER — METOPROLOL TARTRATE 25 MG PO TABS: 25.0000 mg | ORAL_TABLET | Freq: Two times a day (BID) | ORAL | 3 refills | Status: AC | PRN

## 2024-06-07 NOTE — Patient Instructions (Signed)
 Medication Instructions:  Your physician has recommended you make the following change in your medication:  1) STOP taking digoxin   2) CHANGE metoprolol  tartrate to twice daily as needed for heart rate greater than 100   *If you need a refill on your cardiac medications before your next appointment, please call your pharmacy*  Follow-Up: At [facility], you and your health needs are our priority.  As part of our continuing mission to provide you with exceptional heart care, our providers are all part of one team.  This team includes your primary Cardiologist (physician) and Advanced Practice Providers or APPs (Physician Assistants and Nurse Practitioners) who all work together to provide you with the care you need, when you need it.  Your next appointment:   6 months  Provider:   Suzann Riddle, NP

## 2024-06-13 ENCOUNTER — Ambulatory Visit: Admitting: Nurse Practitioner

## 2024-06-15 ENCOUNTER — Encounter: Payer: Self-pay | Admitting: Cardiology

## 2024-06-15 MED ORDER — AMIODARONE HCL 200 MG PO TABS: 200.0000 mg | ORAL_TABLET | Freq: Every day | ORAL | 3 refills | Status: DC

## 2024-06-20 ENCOUNTER — Other Ambulatory Visit: Payer: Self-pay

## 2024-06-21 MED ORDER — AMIODARONE HCL 200 MG PO TABS: 200.0000 mg | ORAL_TABLET | Freq: Every day | ORAL | 3 refills | Status: AC

## 2024-06-21 NOTE — Addendum Note (Signed)
 Addended by: CRISTOPHER OLIVIA PARAS on: 06/21/2024 07:38 AM   Modules accepted: Orders

## 2024-06-29 ENCOUNTER — Encounter: Payer: Self-pay | Admitting: Nurse Practitioner

## 2024-06-29 ENCOUNTER — Ambulatory Visit: Admitting: Nurse Practitioner

## 2024-06-29 VITALS — BP 116/82 | Temp 97.5°F | Ht 63.0 in | Wt 103.0 lb

## 2024-06-29 DIAGNOSIS — Z1231 Encounter for screening mammogram for malignant neoplasm of breast: Secondary | ICD-10-CM | POA: Diagnosis not present

## 2024-06-29 DIAGNOSIS — R413 Other amnesia: Secondary | ICD-10-CM | POA: Diagnosis not present

## 2024-06-29 DIAGNOSIS — R634 Abnormal weight loss: Secondary | ICD-10-CM | POA: Diagnosis not present

## 2024-06-29 DIAGNOSIS — F03A0 Unspecified dementia, mild, without behavioral disturbance, psychotic disturbance, mood disturbance, and anxiety: Secondary | ICD-10-CM

## 2024-06-29 DIAGNOSIS — Z23 Encounter for immunization: Secondary | ICD-10-CM

## 2024-06-29 DIAGNOSIS — E43 Unspecified severe protein-calorie malnutrition: Secondary | ICD-10-CM

## 2024-06-29 DIAGNOSIS — I4811 Longstanding persistent atrial fibrillation: Secondary | ICD-10-CM

## 2024-06-29 DIAGNOSIS — E0590 Thyrotoxicosis, unspecified without thyrotoxic crisis or storm: Secondary | ICD-10-CM

## 2024-06-29 DIAGNOSIS — R4189 Other symptoms and signs involving cognitive functions and awareness: Secondary | ICD-10-CM | POA: Diagnosis not present

## 2024-06-29 NOTE — Progress Notes (Signed)
 BP 116/82   Temp (!) 97.5 F (36.4 C)   Ht 5' 3 (1.6 m)   Wt 103 lb (46.7 kg)   BMI 18.25 kg/m    Subjective:    Patient ID: Kelli Smith, female    DOB: Jan 17, 1941, 83 y.o.   MRN: [id number]  HPI: Kelli Smith is a 83 y.o. female  Chief Complaint  Patient presents with   Medical Management of Chronic Issues    Concerns about losing weight.    Discussed the use of AI scribe software for clinical note transcription with the patient, who gave verbal consent to proceed.  History of Present Illness Kelli Smith is an 83 year old female with dementia and hyperthyroidism who presents for a follow-up on unintentional weight loss. She is accompanied by her daughter, who is her primary caregiver.  Unintentional weight loss and decreased appetite - Significant unintentional weight loss from 124 pounds in September 2025 to 103 pounds currently - Decreased appetite, sometimes not feeling hungry, and eating small portions - No pain while eating - Started on Remeron  at bedtime to stimulate appetite, resulting in some improvement in eating habits but continued weight loss - Very particular about taste and texture of food, making adequate intake challenging - Resistant to protein shakes despite encouragement - chest xray was negative, labs do not show any cause of weight loss Wt Readings from Last 3 Encounters:  06/29/24 103 lb (46.7 kg)  06/07/24 108 lb (49 kg)  04/27/24 116 lb 4.8 oz (52.8 kg)    Hyperthyroidism and medication effects - History of hyperthyroidism - TSH levels checked on June 02, 2024, within normal range - Currently taking methimazole  10 mg daily  Afib -managed by cardiology -currently taking amiodarone  200 mg daily and xarelto  15 mg daily  Functional status and mobility - Recent increase in cane use since hospitalization - Concerns regarding balance and strength          04/27/2024    1:05 PM 03/23/2024    4:11 PM 01/20/2024    2:07 PM   Depression screen PHQ 2/9  Decreased Interest 0 2 1  Down, Depressed, Hopeless 0 1 1  PHQ - 2 Score 0 3 2  Altered sleeping 0 0 1  Tired, decreased energy 0 3 1  Change in appetite 0 3 0  Feeling bad or failure about yourself  0 0 0  Trouble concentrating 0 0 0  Moving slowly or fidgety/restless 0 0 0  Suicidal thoughts 0 0 0  PHQ-9 Score 0  9  4   Difficult doing work/chores Not difficult at all Somewhat difficult Not difficult at all     Data saved with a previous flowsheet row definition    Relevant past medical, surgical, family and social history reviewed and updated as indicated. Interim medical history since our last visit reviewed. Allergies and medications reviewed and updated.  Review of Systems  Ten systems reviewed and is negative except as mentioned in HPI      Objective:      BP 116/82   Temp (!) 97.5 F (36.4 C)   Ht 5' 3 (1.6 m)   Wt 103 lb (46.7 kg)   BMI 18.25 kg/m    Wt Readings from Last 3 Encounters:  06/29/24 103 lb (46.7 kg)  06/07/24 108 lb (49 kg)  04/27/24 116 lb 4.8 oz (52.8 kg)    Physical Exam MEASUREMENTS: Weight- 103. GENERAL: Alert, cooperative, malnourished with muscle wasting, no acute distress. HEENT: Normocephalic, normal oropharynx,  moist mucous membranes. CHEST: Clear to auscultation bilaterally, no wheezes, rhonchi, or crackles. CARDIOVASCULAR: Normal heart rate and rhythm, S1 and S2 normal without murmurs. ABDOMEN: Soft, non-tender, non-distended, without organomegaly, normal bowel sounds. EXTREMITIES: No cyanosis or edema. NEUROLOGICAL: Cranial nerves grossly intact, moves all extremities without gross motor or sensory deficit.  Results for orders placed or performed in visit on 04/27/24  CBC with Differential/Platelet   Collection Time: 04/27/24  1:52 PM  Result Value Ref Range   WBC 7.6 3.8 - 10.8 Thousand/uL   RBC 4.90 3.80 - 5.10 Million/uL   Hemoglobin 11.7 11.7 - 15.5 g/dL   HCT 62.2 64.9 - 54.9 %   MCV 76.9  (L) 80.0 - 100.0 fL   MCH 23.9 (L) 27.0 - 33.0 pg   MCHC 31.0 (L) 32.0 - 36.0 g/dL   RDW 81.9 (H) 88.9 - 84.9 %   Platelets 324 140 - 400 Thousand/uL   MPV 11.9 7.5 - 12.5 fL   Neutro Abs 4,302 1,500 - 7,800 cells/uL   Absolute Lymphocytes 2,022 850 - 3,900 cells/uL   Absolute Monocytes 745 200 - 950 cells/uL   Eosinophils Absolute 479 15 - 500 cells/uL   Basophils Absolute 53 0 - 200 cells/uL   Neutrophils Relative % 56.6 %   Total Lymphocyte 26.6 %   Monocytes Relative 9.8 %   Eosinophils Relative 6.3 %   Basophils Relative 0.7 %  Iron , TIBC and Ferritin Panel   Collection Time: 04/27/24  1:52 PM  Result Value Ref Range   Iron  62 45 - 160 mcg/dL   TIBC 747 749 - 549 mcg/dL (calc)   %SAT 25 16 - 45 % (calc)   Ferritin 224 16 - 288 ng/mL  Comprehensive metabolic panel with GFR   Collection Time: 04/27/24  1:52 PM  Result Value Ref Range   Glucose, Bld 92 65 - 99 mg/dL   BUN 16 7 - 25 mg/dL   Creat 9.41 (L) 9.39 - 0.95 mg/dL   eGFR 90 > OR = 60 fO/fpw/8.26f7   BUN/Creatinine Ratio 28 (H) 6 - 22 (calc)   Sodium 140 135 - 146 mmol/L   Potassium 4.2 3.5 - 5.3 mmol/L   Chloride 105 98 - 110 mmol/L   CO2 27 20 - 32 mmol/L   Calcium  9.7 8.6 - 10.4 mg/dL   Total Protein 6.3 6.1 - 8.1 g/dL   Albumin 3.5 (L) 3.6 - 5.1 g/dL   Globulin 2.8 1.9 - 3.7 g/dL (calc)   AG Ratio 1.3 1.0 - 2.5 (calc)   Total Bilirubin 0.6 0.2 - 1.2 mg/dL   Alkaline phosphatase (APISO) 94 37 - 153 U/L   AST 14 10 - 35 U/L   ALT 11 6 - 29 U/L          Assessment & Plan:   Problem List Items Addressed This Visit       Cardiovascular and Mediastinum   Atrial fibrillation (HCC) - Primary     Endocrine   Hyperthyroidism     Nervous and Auditory   Mild dementia without behavioral disturbance, psychotic disturbance, mood disturbance, or anxiety, unspecified dementia type (HCC)   Relevant Orders   Ambulatory referral to Home Health     Other   Memory loss   Protein-calorie malnutrition, severe    Unintentional weight loss   Relevant Orders   Ambulatory referral to Gastroenterology   Ambulatory referral to Home Health   Other Visit Diagnoses       Cognitive decline  Relevant Orders   Ambulatory referral to Home Health     Immunization due       Relevant Orders   Flu vaccine HIGH DOSE PF(Fluzone Trivalent) (Completed)        Assessment and Plan Assessment & Plan Unintentional weight loss and severe protein-calorie malnutrition Continued unintentional weight loss from 124 pounds in September 2024 to 103 pounds currently. Decreased appetite and small portion intake. No pain during eating. Possible contributing factors include amiodarone  use, cognitive decline, and potential gastrointestinal issues. Recent chest x-ray and lab work did not indicate any acute causes. Differential includes gastrointestinal pathology. - Referred to gastroenterology for further evaluation of weight loss. - Encouraged increased caloric intake, including protein shakes and milkshakes. - Ordered CT chest, abdomen, pelvis to rule out malignancy, however insurance denied scan  Dementia with cognitive impairment Cognitive impairment contributing to decreased appetite and weight loss. Neurology referral scheduled for January. - Continue with neurology appointment in January.  Longstanding persistent atrial fibrillation Atrial fibrillation managed with amiodarone . Potential contribution to weight loss due to medication side effects. - Continue current management with amiodarone .  Thyrotoxicosis (hyperthyroidism) Thyroid  function tests within normal range. No acute thyroid -related issues contributing to weight loss.        Follow up plan: Return in about 3 months (around 09/27/2024) for follow up.

## 2024-07-04 ENCOUNTER — Telehealth: Payer: Self-pay

## 2024-07-04 NOTE — Telephone Encounter (Signed)
 Copied from CRM #[id number]. Topic: General - Other >> Jul 04, 2024  9:53 AM Dedra NOVAK wrote: Reason for CRM: Davene Moore, home health nurse from [facility], received referral to evaluate pt. She evaluated pt this morning and will be admitting her to home health services. Davene can be reached at [phone] if any questions.

## 2024-07-18 ENCOUNTER — Encounter: Payer: Self-pay | Admitting: Podiatry

## 2024-07-18 ENCOUNTER — Ambulatory Visit: Admitting: Podiatry

## 2024-07-18 DIAGNOSIS — M79674 Pain in right toe(s): Secondary | ICD-10-CM

## 2024-07-18 DIAGNOSIS — M79675 Pain in left toe(s): Secondary | ICD-10-CM | POA: Diagnosis not present

## 2024-07-18 DIAGNOSIS — B351 Tinea unguium: Secondary | ICD-10-CM | POA: Diagnosis not present

## 2024-07-18 NOTE — Progress Notes (Signed)
"  °  Subjective:  Patient ID: Kelli Smith, female    DOB: May 05, 1941,  MRN: [id number]  83 y.o. female presents to clinic with  painful thick toenails that are difficult to trim. Pain interferes with ambulation. Aggravating factors include wearing enclosed shoe gear. Pain is relieved with periodic professional debridement. She is accompanied by her daughter on today's visit. Chief Complaint  Patient presents with   Nail Problem     New problem(s): None   PCP is Gareth Mliss FALCON, FNP.  Allergies[1]  Review of Systems: Negative except as noted in the HPI.   Objective:  Lizett Chowning is a pleasant 84 y.o. female WD, WN in NAD. AAO x 3.  Vascular Examination: CFT immediate b/l LE. Palpable DP/PT pulses b/l LE. Digital hair sparse b/l. Skin temperature gradient WNL b/l. No pain with calf compression b/l. No edema noted b/l. No cyanosis or clubbing noted b/l LE.  Neurological Examination: Sensation grossly intact b/l with 10 gram monofilament. Vibratory sensation intact b/l.   Dermatological Examination: Pedal skin with normal turgor, texture and tone b/l. Toenails 1-5 b/l thick, discolored, elongated with subungual debris and pain on dorsal palpation.  Minimal hyperkeratos(is/es) noted distal tip of  left 3rd toe/right 2nd digit and plantar IPJ of left great toe.  Musculoskeletal Examination: Muscle strength 5/5 to b/l LE. HAV with bunion bilaterally and hammertoes 2-5 b/l.  Radiographs: None  Assessment:   1. Pain due to onychomycosis of toenails of both feet    Plan:  Patient was evaluated and treated. All patient's and/or POA's questions/concerns addressed on today's visit. Toenails 1-5 b/l debrided in length and girth without incident. Continue soft, supportive shoe gear daily. Report any pedal injuries to medical professional. Call office if there are any questions/concerns. -Dispensed toe crests for hammertoes b/l feet. Apply to bilateral 3rd toes every morning. Remove every  evening. Daughter related understanding. -Patient/POA to call should there be question/concern in the interim.  Return in about 3 months (around 10/16/2024).  Delon LITTIE Merlin, DPM       LOCATION: [address] [place] [address]                   Office [phone]   [facility] LOCATION: [address] [place], [place] [address] Office [phone]     [1] No Known Allergies  "

## 2024-08-09 ENCOUNTER — Encounter: Payer: Self-pay | Admitting: Nurse Practitioner

## 2024-08-10 ENCOUNTER — Ambulatory Visit: Payer: Medicare Other

## 2024-08-12 ENCOUNTER — Other Ambulatory Visit: Payer: Self-pay | Admitting: Neurology

## 2024-08-12 DIAGNOSIS — R413 Other amnesia: Secondary | ICD-10-CM

## 2024-08-12 DIAGNOSIS — Z1331 Encounter for screening for depression: Secondary | ICD-10-CM

## 2024-08-17 ENCOUNTER — Ambulatory Visit
Admission: RE | Admit: 2024-08-17 | Discharge: 2024-08-17 | Disposition: A | Discharge status: Home / Self Care | Source: Ambulatory Visit | Attending: Neurology | Admitting: Neurology

## 2024-08-17 DIAGNOSIS — R413 Other amnesia: Secondary | ICD-10-CM | POA: Diagnosis present

## 2024-08-17 DIAGNOSIS — Z1331 Encounter for screening for depression: Secondary | ICD-10-CM | POA: Diagnosis present

## 2024-09-27 ENCOUNTER — Ambulatory Visit: Admitting: Nurse Practitioner

## 2024-10-07 ENCOUNTER — Ambulatory Visit

## 2024-10-17 ENCOUNTER — Ambulatory Visit: Admitting: Podiatry
